# Patient Record
Sex: Female | Born: 1940
Health system: Southern US, Community
[De-identification: ages and names within clinical notes are randomized; demographics above are authoritative.]

## PROBLEM LIST (undated history)

## (undated) DIAGNOSIS — E7800 Pure hypercholesterolemia, unspecified: Secondary | ICD-10-CM

## (undated) DIAGNOSIS — IMO0001 Reserved for inherently not codable concepts without codable children: Secondary | ICD-10-CM

## (undated) DIAGNOSIS — G43909 Migraine, unspecified, not intractable, without status migrainosus: Secondary | ICD-10-CM

## (undated) DIAGNOSIS — R251 Tremor, unspecified: Secondary | ICD-10-CM

## (undated) DIAGNOSIS — K449 Diaphragmatic hernia without obstruction or gangrene: Secondary | ICD-10-CM

## (undated) DIAGNOSIS — Z85828 Personal history of other malignant neoplasm of skin: Secondary | ICD-10-CM

## (undated) DIAGNOSIS — M810 Age-related osteoporosis without current pathological fracture: Secondary | ICD-10-CM

## (undated) DIAGNOSIS — I10 Essential (primary) hypertension: Secondary | ICD-10-CM

## (undated) DIAGNOSIS — K529 Noninfective gastroenteritis and colitis, unspecified: Secondary | ICD-10-CM

## (undated) DIAGNOSIS — K635 Polyp of colon: Secondary | ICD-10-CM

## (undated) DIAGNOSIS — G252 Other specified forms of tremor: Secondary | ICD-10-CM

## (undated) DIAGNOSIS — R634 Abnormal weight loss: Secondary | ICD-10-CM

## (undated) DIAGNOSIS — M5030 Other cervical disc degeneration, unspecified cervical region: Secondary | ICD-10-CM

## (undated) DIAGNOSIS — F419 Anxiety disorder, unspecified: Secondary | ICD-10-CM

## (undated) HISTORY — PX: RHINOPLASTY: SUR1284

## (undated) HISTORY — DX: Pure hypercholesterolemia, unspecified: E78.00

## (undated) HISTORY — DX: Polyp of colon: K63.5

## (undated) HISTORY — PX: SINUS SURGERY WITH INSTATRAK: SHX5215

## (undated) HISTORY — DX: Anxiety disorder, unspecified: F41.9

## (undated) HISTORY — DX: Age-related osteoporosis without current pathological fracture: M81.0

## (undated) HISTORY — DX: Diaphragmatic hernia without obstruction or gangrene: K44.9

## (undated) HISTORY — PX: HIATAL HERNIA REPAIR: SHX195

---

## 2002-06-12 ENCOUNTER — Encounter: Admission: RE | Admit: 2002-06-12 | Discharge: 2002-06-12 | Payer: Self-pay | Admitting: Family Medicine

## 2002-06-12 ENCOUNTER — Encounter: Payer: Self-pay | Admitting: Family Medicine

## 2002-06-25 ENCOUNTER — Encounter: Payer: Self-pay | Admitting: Family Medicine

## 2002-06-25 ENCOUNTER — Encounter: Admission: RE | Admit: 2002-06-25 | Discharge: 2002-06-25 | Payer: Self-pay | Admitting: Family Medicine

## 2003-10-21 ENCOUNTER — Other Ambulatory Visit: Admission: RE | Admit: 2003-10-21 | Discharge: 2003-10-21 | Payer: Self-pay | Admitting: Family Medicine

## 2004-01-31 ENCOUNTER — Encounter (INDEPENDENT_AMBULATORY_CARE_PROVIDER_SITE_OTHER): Payer: Self-pay | Admitting: Specialist

## 2004-01-31 ENCOUNTER — Ambulatory Visit (HOSPITAL_COMMUNITY): Admission: RE | Admit: 2004-01-31 | Discharge: 2004-01-31 | Payer: Self-pay | Admitting: Gastroenterology

## 2004-10-22 ENCOUNTER — Other Ambulatory Visit: Admission: RE | Admit: 2004-10-22 | Discharge: 2004-10-22 | Payer: Self-pay | Admitting: Family Medicine

## 2005-10-21 ENCOUNTER — Encounter: Admission: RE | Admit: 2005-10-21 | Discharge: 2005-10-21 | Payer: Self-pay | Admitting: Family Medicine

## 2005-11-17 ENCOUNTER — Encounter: Admission: RE | Admit: 2005-11-17 | Discharge: 2005-11-17 | Payer: Self-pay | Admitting: Otolaryngology

## 2006-10-20 ENCOUNTER — Other Ambulatory Visit: Admission: RE | Admit: 2006-10-20 | Discharge: 2006-10-20 | Payer: Self-pay | Admitting: Family Medicine

## 2007-01-30 ENCOUNTER — Encounter: Admission: RE | Admit: 2007-01-30 | Discharge: 2007-01-30 | Payer: Self-pay | Admitting: Family Medicine

## 2008-10-28 ENCOUNTER — Other Ambulatory Visit: Admission: RE | Admit: 2008-10-28 | Discharge: 2008-10-28 | Payer: Self-pay | Admitting: Family Medicine

## 2010-06-05 NOTE — Op Note (Signed)
NAME:  KHARTER, SESTAK                 ACCOUNT NO.:  [id number]   MEDICAL RECORD NO.:  [id number]          PATIENT TYPE:  AMB   LOCATION:  ENDO                         FACILITY:  [facility]   PHYSICIAN:  Graylin Shiver, M.D.   DATE OF BIRTH:  December 29, 1940   DATE OF PROCEDURE:  01/31/2004  DATE OF DISCHARGE:                                 OPERATIVE REPORT   PROCEDURE:  Colonoscopy with biopsy.   INDICATION:  Screening.   Informed consent was obtained after explanation of the risks of bleeding,  infection, and perforation.   PREMEDICATIONS:  1.  Fentanyl 87.5 mcg IV.  2.  Versed 8 mg IV.   DESCRIPTION OF PROCEDURE:  With the patient in the left lateral decubitus  position, a rectal exam was performed; no masses were felt.  The Olympus  colonoscope was inserted into the rectum and advanced around the colon to  the cecum.  Cecal landmarks were identified.  The cecum and ascending colon  were normal.  The transverse colon normal.  The descending colon normal.  In  the proximal sigmoid, there was a 3 mm polyp biopsied off with cold forceps.  The rectum looked normal.  She tolerated the procedure well without  complications.   IMPRESSION:  Sigmoid polyp, diagnosis code 211.3.   PLAN:  The pathology will be checked.      SFG/MEDQ  D:  01/31/2004  T:  01/31/2004  Job:  [id number]   cc:   L. Lupe Carney, M.D.  [address] [place]  [place] [address]  Fax: [phone]

## 2010-11-23 ENCOUNTER — Other Ambulatory Visit: Payer: Self-pay | Admitting: Family Medicine

## 2010-11-23 ENCOUNTER — Other Ambulatory Visit (HOSPITAL_COMMUNITY)
Admission: RE | Admit: 2010-11-23 | Discharge: 2010-11-23 | Disposition: A | Discharge status: Home / Self Care | Payer: Medicare Other | Source: Ambulatory Visit | Attending: Family Medicine | Admitting: Family Medicine

## 2010-11-23 DIAGNOSIS — Z124 Encounter for screening for malignant neoplasm of cervix: Secondary | ICD-10-CM | POA: Insufficient documentation

## 2012-09-09 ENCOUNTER — Emergency Department (HOSPITAL_COMMUNITY): Payer: Medicare Other

## 2012-09-09 ENCOUNTER — Encounter (HOSPITAL_COMMUNITY): Payer: Self-pay | Admitting: Emergency Medicine

## 2012-09-09 ENCOUNTER — Emergency Department (HOSPITAL_COMMUNITY)
Admission: EM | Admit: 2012-09-09 | Discharge: 2012-09-09 | Disposition: A | Discharge status: Home / Self Care | Payer: Medicare Other | Attending: Emergency Medicine | Admitting: Emergency Medicine

## 2012-09-09 DIAGNOSIS — Z8709 Personal history of other diseases of the respiratory system: Secondary | ICD-10-CM | POA: Insufficient documentation

## 2012-09-09 DIAGNOSIS — Z79899 Other long term (current) drug therapy: Secondary | ICD-10-CM | POA: Insufficient documentation

## 2012-09-09 DIAGNOSIS — G43909 Migraine, unspecified, not intractable, without status migrainosus: Secondary | ICD-10-CM | POA: Insufficient documentation

## 2012-09-09 HISTORY — DX: Migraine, unspecified, not intractable, without status migrainosus: G43.909

## 2012-09-09 MED ORDER — DEXAMETHASONE SODIUM PHOSPHATE 10 MG/ML IJ SOLN
10.0000 mg | Freq: Once | INTRAMUSCULAR | Status: AC
  Administered 2012-09-09: 10 mg via INTRAVENOUS
  Filled 2012-09-09: qty 1

## 2012-09-09 MED ORDER — DIPHENHYDRAMINE HCL 50 MG/ML IJ SOLN
25.0000 mg | Freq: Once | INTRAMUSCULAR | Status: AC
  Administered 2012-09-09: 25 mg via INTRAVENOUS
  Filled 2012-09-09: qty 1

## 2012-09-09 MED ORDER — METOCLOPRAMIDE HCL 5 MG/ML IJ SOLN
10.0000 mg | Freq: Once | INTRAMUSCULAR | Status: AC
  Administered 2012-09-09: 10 mg via INTRAVENOUS
  Filled 2012-09-09: qty 2

## 2012-09-09 MED ORDER — KETOROLAC TROMETHAMINE 30 MG/ML IJ SOLN
30.0000 mg | Freq: Once | INTRAMUSCULAR | Status: AC
  Administered 2012-09-09: 30 mg via INTRAVENOUS
  Filled 2012-09-09: qty 1

## 2012-09-09 MED ORDER — SODIUM CHLORIDE 0.9 % IV BOLUS (SEPSIS)
1000.0000 mL | Freq: Once | INTRAVENOUS | Status: AC
  Administered 2012-09-09: 1000 mL via INTRAVENOUS

## 2012-09-09 NOTE — ED Notes (Addendum)
Pt states migraine started yesterday. Pt states hx of sinus infection. Pt denies drainage. Pt states bones in face hurt. Pt denies numbness and tingling. Pt able to move all extremities. Pt speaking in clear complete sentences. Pt has edema noted to left eye. Pt states hx of migraines. Pt denies n/v/d. Pt alert and mentating appropriately.

## 2012-09-09 NOTE — ED Provider Notes (Signed)
CSN: [id number]     Arrival date & time 09/09/12  1224 History     First MD Initiated Contact with Patient 09/09/12 1236     Chief Complaint  Patient presents with  . Migraine   (Consider location/radiation/quality/duration/timing/severity/associated sxs/prior Treatment) Patient is a 72 y.o. female presenting with migraines. The history is provided by the patient.  Migraine This is a chronic problem. The current episode started 2 days ago. The problem occurs constantly. The problem has been gradually worsening. Associated symptoms include headaches. Pertinent negatives include no abdominal pain and no shortness of breath. Nothing aggravates the symptoms. Nothing relieves the symptoms. She has tried nothing for the symptoms.    Past Medical History  Diagnosis Date  . Migraines   . Sinus infection    No past surgical history on file. No family history on file. History  Substance Use Topics  . Smoking status: Not on file  . Smokeless tobacco: Not on file  . Alcohol Use: Not on file   OB History   Grav Para Term Preterm Abortions TAB SAB Ect Mult Living                 Review of Systems  Constitutional: Negative for fever.  Respiratory: Negative for cough and shortness of breath.   Gastrointestinal: Negative for vomiting and abdominal pain.  Neurological: Positive for headaches.  All other systems reviewed and are negative.    Allergies  Codeine and Macrodantin  Home Medications   Current Outpatient Rx  Name  Route  Sig  Dispense  Refill  . Butalbital-APAP-Caffeine (ESGIC PO)   Oral   Take 2 tablets by mouth daily as needed (headache).          . CALCIUM PO   Oral   Take 1 tablet by mouth daily.         . cholestyramine (QUESTRAN) 4 GM/DOSE powder   Oral   Take 2 g by mouth 3 (three) times daily with meals.         . Esomeprazole Magnesium (NEXIUM PO)   Oral   Take 1 tablet by mouth daily as needed (inflimation).         Marland Kitchen estrogen,  conjugated,-medroxyprogesterone (PREMPRO) 0.3-1.5 MG per tablet   Oral   Take 1 tablet by mouth daily.         . hydrochlorothiazide (HYDRODIURIL) 25 MG tablet   Oral   Take 25 mg by mouth daily.         Marland Kitchen ibuprofen (ADVIL,MOTRIN) 200 MG tablet   Oral   Take 200 mg by mouth every 6 (six) hours as needed for pain.         . metoprolol succinate (TOPROL-XL) 50 MG 24 hr tablet   Oral   Take 50 mg by mouth daily. Take with or immediately following a meal.         . Multiple Vitamins-Minerals (MULTIVITAMIN WITH MINERALS) tablet   Oral   Take 1 tablet by mouth daily.         Marland Kitchen OVER THE COUNTER MEDICATION   Sublingual   Place 1 tablet under the tongue daily as needed (headache).         . potassium chloride (K-DUR,KLOR-CON) 10 MEQ tablet   Oral   Take 10 mEq by mouth daily.         . verapamil (COVERA HS) 240 MG (CO) 24 hr tablet   Oral   Take 240 mg by mouth at bedtime.  BP 176/57  Temp(Src) 99.3 F (37.4 C) (Oral)  Resp 22  Ht 5\' 4"  (1.626 m)  Wt 125 lb (56.7 kg)  BMI 21.45 kg/m2  SpO2 98% Physical Exam  Nursing note and vitals reviewed. Constitutional: She is oriented to person, place, and time. She appears well-developed and well-nourished. No distress.  HENT:  Head: Normocephalic and atraumatic.  Eyes: EOM are normal. Pupils are equal, round, and reactive to light.  L periorbital area tender  Neck: Normal range of motion. Neck supple.  Cardiovascular: Normal rate and regular rhythm.  Exam reveals no friction rub.   No murmur heard. Pulmonary/Chest: Effort normal and breath sounds normal. No respiratory distress. She has no wheezes. She has no rales.  Abdominal: Soft. She exhibits no distension. There is no tenderness. There is no rebound.  Musculoskeletal: Normal range of motion. She exhibits no edema.  Neurological: She is alert and oriented to person, place, and time. No cranial nerve deficit. She exhibits normal muscle tone. Coordination  normal.  Skin: No rash noted. She is not diaphoretic.    ED Course   Procedures (including critical care time)  Labs Reviewed  SEDIMENTATION RATE  C-REACTIVE PROTEIN   Ct Head Wo Contrast  09/09/2012   *RADIOLOGY REPORT*  Clinical Data: Headache.  Hypertension.  CT HEAD WITHOUT CONTRAST  Technique:  Contiguous axial images were obtained from the base of the skull through the vertex without contrast.  Comparison: 01/30/2007  Findings: The ventricles are normal in overall configuration. There is mild ex vacuo dilation of the left lateral ventricle from an old infarct and there is mild generalized ventricular and sulcal enlargement reflecting age related volume loss.  No hydrocephalus.  There are no parenchymal masses or mass effect.  An old infarct extends from the left basal ganglia to the left centrum semiovale only.  There are other patchy areas of white matter hypoattenuation consistent with moderate chronic microvascular ischemic change. There is no evidence of a recent transcortical infarct.  There are no extra-axial masses or abnormal fluid collections. There is no intracranial hemorrhage.  The left maxillary sinus is opacified.  The wall is mildly thickened reflecting chronic left maxillary sinus disease.  There is mucosal thickening along the middle and anterior left ethmoid air cells in the right anterior ethmoid air cells and opacification of the left frontal sinus.  IMPRESSION: No acute intracranial abnormalities.  Old left infarct, moderate chronic microvascular ischemic change and age related volume loss.  Significant sinus disease as detailed.   Original Report Authenticated By: Amie Portland, M.D.   1. Migraine     MDM  46F presents with headache. History of migraines. States this feels similar to her previous migraines. Gradual onset since yesterday. No nausea and vomiting. Associated photophobia and phonophobia. Patient denies any vision changes. She does have some left periorbital  pain, she states she always gets. Last one of these migraines was 5 years ago. Here her vitals are stable. Neuro exam is normal. Normal cranial nerves normal coordination normal sensation and strength. (Oral is tender. She reports his always happens. We'll check ESR and CRP to screen for possible temporal arteritis. We'll scan her head. Also given a migraine cocktail. I do not feel this is consistent with a subarachnoid hemorrhage due to gradual onset and consistency with previous migraines.. Symptoms improving. Toradol given. Head CT with old infarcts, nothing new.  Patient's symptoms overall improving. States she can f/u with Dr. Clovis Riley. CRP is send out, unable to get at this time.  Due to L frontal sinus disease no vision changes, high unlikelihood of temporal arteritis. Instructed to f/u with PCP for f/u lab results.   I have reviewed all labs and imaging and considered them in my medical decision making.   Dagmar Hait, MD 09/09/12 [phone]

## 2012-09-09 NOTE — ED Notes (Signed)
Pt usually wears contacts.  Did have contact in left eye.  Did not have contact in right eye.

## 2012-09-09 NOTE — ED Notes (Signed)
Dr. Walden at bedside 

## 2012-09-11 ENCOUNTER — Encounter (HOSPITAL_COMMUNITY): Payer: Self-pay | Admitting: *Deleted

## 2012-09-11 ENCOUNTER — Emergency Department (HOSPITAL_COMMUNITY)
Admission: EM | Admit: 2012-09-11 | Discharge: 2012-09-11 | Disposition: A | Discharge status: Home / Self Care | Payer: Medicare Other | Attending: Emergency Medicine | Admitting: Emergency Medicine

## 2012-09-11 DIAGNOSIS — G43909 Migraine, unspecified, not intractable, without status migrainosus: Secondary | ICD-10-CM | POA: Insufficient documentation

## 2012-09-11 DIAGNOSIS — R112 Nausea with vomiting, unspecified: Secondary | ICD-10-CM | POA: Insufficient documentation

## 2012-09-11 DIAGNOSIS — Z881 Allergy status to other antibiotic agents status: Secondary | ICD-10-CM | POA: Insufficient documentation

## 2012-09-11 DIAGNOSIS — Z885 Allergy status to narcotic agent status: Secondary | ICD-10-CM | POA: Insufficient documentation

## 2012-09-11 DIAGNOSIS — Z8669 Personal history of other diseases of the nervous system and sense organs: Secondary | ICD-10-CM | POA: Insufficient documentation

## 2012-09-11 DIAGNOSIS — Z79899 Other long term (current) drug therapy: Secondary | ICD-10-CM | POA: Insufficient documentation

## 2012-09-11 DIAGNOSIS — L0390 Cellulitis, unspecified: Secondary | ICD-10-CM

## 2012-09-11 DIAGNOSIS — I10 Essential (primary) hypertension: Secondary | ICD-10-CM | POA: Insufficient documentation

## 2012-09-11 DIAGNOSIS — H05019 Cellulitis of unspecified orbit: Secondary | ICD-10-CM | POA: Insufficient documentation

## 2012-09-11 DIAGNOSIS — M5030 Other cervical disc degeneration, unspecified cervical region: Secondary | ICD-10-CM | POA: Insufficient documentation

## 2012-09-11 DIAGNOSIS — Z87891 Personal history of nicotine dependence: Secondary | ICD-10-CM | POA: Insufficient documentation

## 2012-09-11 HISTORY — DX: Other cervical disc degeneration, unspecified cervical region: M50.30

## 2012-09-11 HISTORY — DX: Essential (primary) hypertension: I10

## 2012-09-11 MED ORDER — SULFAMETHOXAZOLE-TRIMETHOPRIM 800-160 MG PO TABS: 1.0000 | ORAL_TABLET | Freq: Two times a day (BID) | ORAL | Status: DC

## 2012-09-11 MED ORDER — CEPHALEXIN 500 MG PO CAPS: 500.0000 mg | ORAL_CAPSULE | Freq: Four times a day (QID) | ORAL | Status: DC

## 2012-09-11 MED ORDER — METOCLOPRAMIDE HCL 10 MG PO TABS: 5.0000 mg | ORAL_TABLET | Freq: Four times a day (QID) | ORAL | Status: DC

## 2012-09-11 MED ORDER — KETOROLAC TROMETHAMINE 30 MG/ML IJ SOLN
30.0000 mg | Freq: Once | INTRAMUSCULAR | Status: AC
  Administered 2012-09-11: 30 mg via INTRAVENOUS
  Filled 2012-09-11: qty 1

## 2012-09-11 MED ORDER — SODIUM CHLORIDE 0.9 % IV BOLUS (SEPSIS)
1000.0000 mL | Freq: Once | INTRAVENOUS | Status: AC
  Administered 2012-09-11: 1000 mL via INTRAVENOUS

## 2012-09-11 MED ORDER — METOCLOPRAMIDE HCL 5 MG/ML IJ SOLN
10.0000 mg | Freq: Once | INTRAMUSCULAR | Status: AC
  Administered 2012-09-11: 10 mg via INTRAVENOUS
  Filled 2012-09-11: qty 2

## 2012-09-11 MED ORDER — DIPHENHYDRAMINE HCL 50 MG/ML IJ SOLN
25.0000 mg | Freq: Once | INTRAMUSCULAR | Status: AC
  Administered 2012-09-11: 25 mg via INTRAVENOUS
  Filled 2012-09-11: qty 1

## 2012-09-11 NOTE — ED Notes (Signed)
Pt able to drink fluids without difficulty 

## 2012-09-11 NOTE — ED Notes (Signed)
Pt states she has been having a headache since Saturday.  Pt states she was here at the end of last week for the same headache, and was told her stress was causing the HA.

## 2012-09-11 NOTE — ED Provider Notes (Signed)
CSN: [id number]     Arrival date & time 09/11/12  4098 History     First MD Initiated Contact with Patient 09/11/12 [phone]     Chief Complaint  Patient presents with  . Headache   (Consider location/radiation/quality/duration/timing/severity/associated sxs/prior Treatment) Patient is a 72 y.o. female presenting with headaches. The history is provided by the patient and the spouse.  Headache Associated symptoms: eye pain (left), nausea, photophobia and vomiting   Associated symptoms: no abdominal pain, no fever, no neck pain and no neck stiffness   Brenda [name] is a 72 y/o F with PMHx of HTN, sinus infections, migraines presenting to the ED with migraine that started last night. Patient reported that the migraine is localized to the left side of the head, described as a constant throbbing sensation without radiation. Patient reported that she has photophobia, phonophobia, nausea for a couple of days with emesis today - reported that she has vomited 3 times today, NB and NB. Patient reported that she normally presents with her migraines in this manner - no new symptoms. Patient reported that on Friday she noticed that her left eye was swollen and red. Reported that it tears mildly, mild warmth to the touch, itches and is mildly painful upon palpation. Denied injury, worst headache of life, fever, chills, neck pain, neck stiffness, back pain, abdominal pain, numbness, tingling, slurred speech, weakness, chest pain, shortness of breath, difficulty breathing.  PCP Dr. Melba Coon  Past Medical History  Diagnosis Date  . Migraines   . Sinus infection   . Hypertension   . DDD (degenerative disc disease), cervical    Past Surgical History  Procedure Laterality Date  . Sinus surgery with instatrak     No family history on file. History  Substance Use Topics  . Smoking status: Former Smoker    Quit date: 09/11/1988  . Smokeless tobacco: Not on file  . Alcohol Use: Yes     Comment: 2  glasses per day   OB History   Grav Para Term Preterm Abortions TAB SAB Ect Mult Living                 Review of Systems  Constitutional: Negative for fever and chills.  HENT: Negative for neck pain and neck stiffness.   Eyes: Positive for photophobia and pain (left).  Respiratory: Negative for chest tightness and shortness of breath.   Cardiovascular: Negative for chest pain.  Gastrointestinal: Positive for nausea and vomiting. Negative for abdominal pain.  Neurological: Positive for headaches. Negative for weakness.  All other systems reviewed and are negative.    Allergies  Codeine and Macrodantin  Home Medications   Current Outpatient Rx  Name  Route  Sig  Dispense  Refill  . Butalbital-APAP-Caffeine (ESGIC PO)   Oral   Take 2 tablets by mouth daily as needed (headache).          . CALCIUM PO   Oral   Take 1 tablet by mouth daily.         . cholestyramine (QUESTRAN) 4 GM/DOSE powder   Oral   Take 2 g by mouth 3 (three) times daily with meals.         . Esomeprazole Magnesium (NEXIUM PO)   Oral   Take 1 tablet by mouth daily as needed (inflimation).         Marland Kitchen estrogen, conjugated,-medroxyprogesterone (PREMPRO) 0.3-1.5 MG per tablet   Oral   Take 1 tablet by mouth daily.         Marland Kitchen  hydrochlorothiazide (HYDRODIURIL) 25 MG tablet   Oral   Take 25 mg by mouth daily.         Marland Kitchen ibuprofen (ADVIL,MOTRIN) 200 MG tablet   Oral   Take 200 mg by mouth every 6 (six) hours as needed for pain.         . metoprolol succinate (TOPROL-XL) 50 MG 24 hr tablet   Oral   Take 50 mg by mouth daily. Take with or immediately following a meal.         . Multiple Vitamins-Minerals (MULTIVITAMIN WITH MINERALS) tablet   Oral   Take 1 tablet by mouth daily.         . potassium chloride (K-DUR,KLOR-CON) 10 MEQ tablet   Oral   Take 10 mEq by mouth daily.         . rizatriptan (MAXALT-MLT) 10 MG disintegrating tablet   Oral   Take 10 mg by mouth as needed for  migraine. May repeat in 2 hours if needed         . verapamil (COVERA HS) 240 MG (CO) 24 hr tablet   Oral   Take 240 mg by mouth at bedtime.         . cephALEXin (KEFLEX) 500 MG capsule   Oral   Take 1 capsule (500 mg total) by mouth 4 (four) times daily.   40 capsule   0   . metoCLOPramide (REGLAN) 10 MG tablet   Oral   Take 0.5 tablets (5 mg total) by mouth every 6 (six) hours.   30 tablet   0   . sulfamethoxazole-trimethoprim (BACTRIM DS,SEPTRA DS) 800-160 MG per tablet   Oral   Take 1 tablet by mouth 2 (two) times daily. One po bid x 7 days   14 tablet   0    BP 138/67  Pulse 93  Temp(Src) 98 F (36.7 C) (Oral)  Resp 16  SpO2 99% Physical Exam  Nursing note and vitals reviewed. Constitutional: She is oriented to person, place, and time. She appears well-developed and well-nourished. No distress.  HENT:  Head: Normocephalic and atraumatic.  Negative asymmetry Negative facial drooping  Pain upon palpation to the frontal sinuses bilaterally  Eyes: Conjunctivae and EOM are normal. Pupils are equal, round, and reactive to light. Right eye exhibits no discharge. Left eye exhibits no discharge.  Left periorbital swelling noted - discomfort upon palpation. Erythema noted to the upper eyelid.  Sclera negative for injection. Negative drainage noted to the left eye. Mild tearing noted to the left eye. PERRLA proper.  Full ROM to the left eye with negative pain noted Negative pain upon palpation to the actual eye - discomfort upon palpation to the upper eyelid  Neck: Normal range of motion. Neck supple.  Negative neck stiffness Negative nuchal rigidity  Cardiovascular: Normal rate, regular rhythm and normal heart sounds.   Pulses:      Radial pulses are 2+ on the right side, and 2+ on the left side.       Dorsalis pedis pulses are 2+ on the right side, and 2+ on the left side.  Pulmonary/Chest: Effort normal and breath sounds normal. No respiratory distress. She has no  wheezes. She has no rales.  Abdominal: Soft.  Musculoskeletal: Normal range of motion.  Lymphadenopathy:    She has no cervical adenopathy.  Neurological: She is alert and oriented to person, place, and time. No cranial nerve deficit or sensory deficit. She exhibits normal muscle tone. Coordination normal. GCS eye  subscore is 4. GCS verbal subscore is 5. GCS motor subscore is 6.  Cranial nerves III-XII grossly intact  Sensation intact to upper and lower extremities bilaterally with differentiation to sharp and dull touch  Strength 5+/5+ with resistance to upper and lower extremities bilaterally - equal distribution  Follows commands appropriately - responds to questions appropriately.  NIHSS 0   Skin: Skin is warm and dry. No rash noted. She is not diaphoretic. No erythema.  Psychiatric: She has a normal mood and affect. Her behavior is normal. Thought content normal.    ED Course   Procedures (including critical care time)  Reviewed patient's chart - patient was recently seen in the ED regarding migraine episode and left eye swelling on 09/09/2012.  CT scan of head without contrast performed with negative acute abnormalities noted, old infarcts noted.   8:45 AM Re-checked on patient - reported that she is feeling better. Denied nausea and emesis. Stated that her headache has improved.   10:35 AM Patient re-assessed. Doing well. Pain has improved. Patient tolerated PO challenge. Discussed plan for discharge, patient agreed to plan of care.   Labs Reviewed - No data to display Ct Head Wo Contrast  09/09/2012   *RADIOLOGY REPORT*  Clinical Data: Headache.  Hypertension.  CT HEAD WITHOUT CONTRAST  Technique:  Contiguous axial images were obtained from the base of the skull through the vertex without contrast.  Comparison: 01/30/2007  Findings: The ventricles are normal in overall configuration. There is mild ex vacuo dilation of the left lateral ventricle from an old infarct and there is mild  generalized ventricular and sulcal enlargement reflecting age related volume loss.  No hydrocephalus.  There are no parenchymal masses or mass effect.  An old infarct extends from the left basal ganglia to the left centrum semiovale only.  There are other patchy areas of white matter hypoattenuation consistent with moderate chronic microvascular ischemic change. There is no evidence of a recent transcortical infarct.  There are no extra-axial masses or abnormal fluid collections. There is no intracranial hemorrhage.  The left maxillary sinus is opacified.  The wall is mildly thickened reflecting chronic left maxillary sinus disease.  There is mucosal thickening along the middle and anterior left ethmoid air cells in the right anterior ethmoid air cells and opacification of the left frontal sinus.  IMPRESSION: No acute intracranial abnormalities.  Old left infarct, moderate chronic microvascular ischemic change and age related volume loss.  Significant sinus disease as detailed.   Original Report Authenticated By: Amie Portland, M.D.   1. Migraine   2. Cellulitis     MDM  Patient presenting to the ED with migraine that started last night. Reported eye swelling to the left eye that started on Friday. Patient reported that her migraine presents like her normal migraines - patient has a long history. Denied new symptoms, head injury, eye trauma.  Alert and oriented. Cranial nerves grossly intact. Sensation intact. Strength intact and equally distributed. Swelling and erythema noted to the left periorbital region - full ROM to the eye with negative discomfort noted, negative injection noted, PERRLA present. Responds to questions appropriately and pleasant upon exam.  Patient recently seen in the ED on 09/09/2012 for same complaints - migraine and left eye swelling. CT scan of head without contrast performed with negative acute intracranial abnormalities noted - old infarcts noted to exam.  Doubt SAH. Doubt  stroke. Suspicion to be migraine-like in nature. Eye swelling suspicion to be possible periorbital cellulitis. Patient stable, afebrile.  Migraine improved while in ED setting. Patient tolerated PO challenge. Discharged patient with reglan and antibiotics. Referred patient to PCP within 24 hours and to follow-up with ophthalmology regarding eye complaint. Discussed with patient to avoid wearing contacts until seen by ophthalmology. Discussed with patient to rest and stay hydrated. Discussed with patient to use Excedrin migraine relief and benadryl for aid in migraine discomfort. Discussed with patient to closely monitor symptoms and if symptoms are to worsen or change to report back to the ED - strict return instructions given.  Patient agreed to plan of care, understood, all questions answered.   Raymon Mutton, PA-C 09/11/12 [phone]

## 2012-09-14 NOTE — ED Provider Notes (Signed)
Medical screening examination/treatment/procedure(s) were performed by non-physician practitioner and as supervising physician I was immediately available for consultation/collaboration.   Laray Anger, DO 09/14/12 1241

## 2014-04-17 ENCOUNTER — Other Ambulatory Visit: Payer: Self-pay | Admitting: Family Medicine

## 2014-10-17 ENCOUNTER — Emergency Department (HOSPITAL_COMMUNITY): Payer: Medicare Other

## 2014-10-17 ENCOUNTER — Encounter (HOSPITAL_COMMUNITY): Payer: Self-pay | Admitting: Emergency Medicine

## 2014-10-17 ENCOUNTER — Emergency Department (HOSPITAL_COMMUNITY)
Admission: EM | Admit: 2014-10-17 | Discharge: 2014-10-17 | Disposition: A | Discharge status: Home / Self Care | Payer: Medicare Other | Attending: Emergency Medicine | Admitting: Emergency Medicine

## 2014-10-17 DIAGNOSIS — X58XXXA Exposure to other specified factors, initial encounter: Secondary | ICD-10-CM | POA: Insufficient documentation

## 2014-10-17 DIAGNOSIS — T18108A Unspecified foreign body in esophagus causing other injury, initial encounter: Secondary | ICD-10-CM | POA: Diagnosis present

## 2014-10-17 DIAGNOSIS — I10 Essential (primary) hypertension: Secondary | ICD-10-CM | POA: Diagnosis not present

## 2014-10-17 DIAGNOSIS — Z8739 Personal history of other diseases of the musculoskeletal system and connective tissue: Secondary | ICD-10-CM | POA: Diagnosis not present

## 2014-10-17 DIAGNOSIS — Y998 Other external cause status: Secondary | ICD-10-CM | POA: Insufficient documentation

## 2014-10-17 DIAGNOSIS — Z79899 Other long term (current) drug therapy: Secondary | ICD-10-CM | POA: Insufficient documentation

## 2014-10-17 DIAGNOSIS — Y9289 Other specified places as the place of occurrence of the external cause: Secondary | ICD-10-CM | POA: Insufficient documentation

## 2014-10-17 DIAGNOSIS — Z8709 Personal history of other diseases of the respiratory system: Secondary | ICD-10-CM | POA: Diagnosis not present

## 2014-10-17 DIAGNOSIS — G43909 Migraine, unspecified, not intractable, without status migrainosus: Secondary | ICD-10-CM | POA: Insufficient documentation

## 2014-10-17 DIAGNOSIS — Y9389 Activity, other specified: Secondary | ICD-10-CM | POA: Diagnosis not present

## 2014-10-17 DIAGNOSIS — Z87891 Personal history of nicotine dependence: Secondary | ICD-10-CM | POA: Insufficient documentation

## 2014-10-17 HISTORY — DX: Tremor, unspecified: R25.1

## 2014-10-17 LAB — BASIC METABOLIC PANEL
ANION GAP: 13 (ref 5–15)
BUN: 11 mg/dL (ref 6–20)
CHLORIDE: 105 mmol/L (ref 101–111)
CO2: 21 mmol/L — ABNORMAL LOW (ref 22–32)
Calcium: 9.3 mg/dL (ref 8.9–10.3)
Creatinine, Ser: 0.81 mg/dL (ref 0.44–1.00)
GFR calc non Af Amer: >60 mL/min (ref >60)
Glucose, Bld: 101 mg/dL — ABNORMAL HIGH (ref 65–99)
POTASSIUM: 4.1 mmol/L (ref 3.5–5.1)
SODIUM: 139 mmol/L (ref 135–145)

## 2014-10-17 LAB — CBC WITH DIFFERENTIAL/PLATELET
BASOS PCT: 0 %
Basophils Absolute: 0 10*3/uL (ref 0.0–0.1)
EOS ABS: 0.1 10*3/uL (ref 0.0–0.7)
Eosinophils Relative: 1 %
HEMATOCRIT: 43.8 % (ref 36.0–46.0)
HEMOGLOBIN: 14.8 g/dL (ref 12.0–15.0)
Lymphocytes Relative: 16 %
Lymphs Abs: 1.3 10*3/uL (ref 0.7–4.0)
MCH: 32.5 pg (ref 26.0–34.0)
MCHC: 33.8 g/dL (ref 30.0–36.0)
MCV: 96.3 fL (ref 78.0–100.0)
Monocytes Absolute: 0.5 10*3/uL (ref 0.1–1.0)
Monocytes Relative: 6 %
NEUTROS ABS: 6.1 10*3/uL (ref 1.7–7.7)
NEUTROS PCT: 77 %
Platelets: 187 10*3/uL (ref 150–400)
RBC: 4.55 MIL/uL (ref 3.87–5.11)
RDW: 12.4 % (ref 11.5–15.5)
WBC: 8 10*3/uL (ref 4.0–10.5)

## 2014-10-17 MED ORDER — METOCLOPRAMIDE HCL 5 MG/ML IJ SOLN
10.0000 mg | Freq: Once | INTRAMUSCULAR | Status: AC
  Administered 2014-10-17: 10 mg via INTRAVENOUS
  Filled 2014-10-17: qty 2

## 2014-10-17 MED ORDER — GI COCKTAIL ~~LOC~~
30.0000 mL | Freq: Once | ORAL | Status: AC
  Administered 2014-10-17: 30 mL via ORAL
  Filled 2014-10-17: qty 30

## 2014-10-17 MED ORDER — DIPHENHYDRAMINE HCL 50 MG/ML IJ SOLN
12.5000 mg | Freq: Once | INTRAMUSCULAR | Status: AC
  Administered 2014-10-17: 12.5 mg via INTRAVENOUS
  Filled 2014-10-17: qty 1

## 2014-10-17 MED ORDER — GLUCAGON HCL RDNA (DIAGNOSTIC) 1 MG IJ SOLR
0.5000 mg | Freq: Once | INTRAMUSCULAR | Status: AC
  Administered 2014-10-17: 0.5 mg via INTRAVENOUS
  Filled 2014-10-17: qty 0.5

## 2014-10-17 NOTE — ED Notes (Signed)
Patient transported to X-ray, nad

## 2014-10-17 NOTE — ED Provider Notes (Signed)
CSN: [id number]     Arrival date & time 10/17/14  1658 History   First MD Initiated Contact with Patient 10/17/14 1725     Chief Complaint  Patient presents with  . Foreign Body     (Consider location/radiation/quality/duration/timing/severity/associated sxs/prior Treatment) The history is provided by the patient.  Brenda [name] is a 74 y.o. female history hypertension here presenting with possible pill stuck in esophagus. Patient states that she took her morning medicine around 7 AM. She tries to take all her small pills first and then took her larger pills that includes calcium, potassium, vitamins. She felt that those larger pills got stuck and she was unable to tolerate any secretions since then. She was unable to keep any food or fluids down. She states that sometimes this happen in the past but she usually walks and never needed surgery or endoscopy for this. Seen [facility] in the past for colonoscopy.   Past Medical History  Diagnosis Date  . Migraines   . Sinus infection   . Hypertension   . DDD (degenerative disc disease), cervical   . Tremor    Past Surgical History  Procedure Laterality Date  . Sinus surgery with instatrak     History reviewed. No pertinent family history. Social History  Substance Use Topics  . Smoking status: Former Smoker    Quit date: 09/11/1988  . Smokeless tobacco: None  . Alcohol Use: Yes     Comment: 2 glasses per day   OB History    No data available     Review of Systems  Gastrointestinal: Positive for vomiting.  All other systems reviewed and are negative.     Allergies  Codeine and Macrodantin  Home Medications   Prior to Admission medications   Medication Sig Start Date End Date Taking? Authorizing Provider  atorvastatin (LIPITOR) 20 MG tablet Take 20 mg by mouth daily.   Yes Historical Provider, MD  Butalbital-APAP-Caffeine (ESGIC PO) Take 2 tablets by mouth daily as needed (headache).    Yes Historical Provider, MD   CALCIUM PO Take 1 tablet by mouth daily.   Yes Historical Provider, MD  cholestyramine Lanetta Inch) 4 GM/DOSE powder Take 2 g by mouth 3 (three) times daily with meals.   Yes Historical Provider, MD  esomeprazole (NEXIUM) 40 MG capsule Take 40 mg by mouth daily at 12 noon.   Yes Historical Provider, MD  estrogen, conjugated,-medroxyprogesterone (PREMPRO) 0.3-1.5 MG per tablet Take 1 tablet by mouth daily.   Yes Historical Provider, MD  hydrochlorothiazide (HYDRODIURIL) 25 MG tablet Take 25 mg by mouth daily.   Yes Historical Provider, MD  ibuprofen (ADVIL,MOTRIN) 200 MG tablet Take 200 mg by mouth every 6 (six) hours as needed for pain.   Yes Historical Provider, MD  metoprolol (LOPRESSOR) 50 MG tablet Take 50 mg by mouth 2 (two) times daily.   Yes Historical Provider, MD  Multiple Vitamins-Minerals (MULTIVITAMIN WITH MINERALS) tablet Take 1 tablet by mouth daily.   Yes Historical Provider, MD  potassium chloride (K-DUR,KLOR-CON) 10 MEQ tablet Take 10 mEq by mouth daily.   Yes Historical Provider, MD  verapamil (COVERA HS) 240 MG (CO) 24 hr tablet Take 240 mg by mouth at bedtime.   Yes Historical Provider, MD  cephALEXin (KEFLEX) 500 MG capsule Take 1 capsule (500 mg total) by mouth 4 (four) times daily. Patient not taking: Reported on 10/17/2014 09/11/12   Marissa Sciacca, PA-C  metoCLOPramide (REGLAN) 10 MG tablet Take 0.5 tablets (5 mg total) by  mouth every 6 (six) hours. Patient not taking: Reported on 10/17/2014 09/11/12   Marissa Sciacca, PA-C  sulfamethoxazole-trimethoprim (BACTRIM DS,SEPTRA DS) 800-160 MG per tablet Take 1 tablet by mouth 2 (two) times daily. One po bid x 7 days Patient not taking: Reported on 10/17/2014 09/11/12   Marissa Sciacca, PA-C   BP 151/49 mmHg  Pulse 92  Temp(Src) 97.5 F (36.4 C) (Oral)  Resp 18  SpO2 97% Physical Exam  Constitutional: She is oriented to person, place, and time.  Uncomfortable, spitting up   HENT:  Head: Normocephalic.  MM dry   Eyes:  Conjunctivae are normal. Pupils are equal, round, and reactive to light.  Neck: Normal range of motion. Neck supple.  Cardiovascular: Normal rate, regular rhythm and normal heart sounds.   Pulmonary/Chest: Effort normal and breath sounds normal. No respiratory distress. She has no wheezes. She has no rales.  Abdominal: Soft. Bowel sounds are normal. She exhibits no distension. There is no tenderness. There is no rebound.  Musculoskeletal: Normal range of motion. She exhibits no edema or tenderness.  Neurological: She is alert and oriented to person, place, and time. No cranial nerve deficit. Coordination normal.  Skin: Skin is warm and dry.  Psychiatric: She has a normal mood and affect. Her behavior is normal. Judgment and thought content normal.  Nursing note and vitals reviewed.   ED Course  Procedures (including critical care time) Labs Review Labs Reviewed  CBC WITH DIFFERENTIAL/PLATELET  BASIC METABOLIC PANEL    Imaging Review Dg Chest 2 View  10/17/2014   CLINICAL DATA:  Globus sensation in the chest after swallowing medications.  EXAM: CHEST  2 VIEW  COMPARISON:  None.  FINDINGS: Normal heart size. Mixed retrocardiac lucency and density is suggestive of a small to moderate hiatal hernia. Atherosclerotic aortic arch. Otherwise normal mediastinal contour. No radiopaque foreign body in the chest. No pneumothorax. No pleural effusion. Clear lungs, with no pulmonary edema. Mild degenerative changes in the thoracic spine.  IMPRESSION: 1. No radiopaque foreign body in the chest. 2. Mixed retrocardiac lucency and density is suggestive of a small to moderate hiatal hernia. 3. No active cardiopulmonary disease.   Electronically Signed   By: Delbert Phenix M.D.   On: 10/17/2014 17:54   I have personally reviewed and evaluated these images and lab results as part of my medical decision-making.   EKG Interpretation None      MDM   Final diagnoses:  None    Brenda [name] is a 74 y.o.  female here with possible foreign body in esophagus. Patient spitting up saliva. Will get xray, give glucagon, reglan.  7:21 PM Felt better now. Tolerated some soda after GI cocktail. CXR showed hiatal hernia. Told her to take pills individually and not altogether and see GI doctor.    Richardean Canal, MD 10/17/14 Norberta Keens

## 2014-10-17 NOTE — Discharge Instructions (Signed)
Take your nexium early.   Take your pills individually and not altogether.  See [facility].   Return to ER if you have trouble swallowing, vomiting, trouble breathing.

## 2014-10-17 NOTE — ED Notes (Signed)
Pt with possible foreign body from pill in esophagus since this am; pt unable to swallow secretions; pt sts hx of similar but this more severe; pt noted htn and sts unable to take meds today

## 2014-10-17 NOTE — ED Notes (Signed)
Pharmacy notified for glucagon. States will send.

## 2014-10-21 ENCOUNTER — Other Ambulatory Visit: Payer: Self-pay | Admitting: Gastroenterology

## 2014-10-21 DIAGNOSIS — R1310 Dysphagia, unspecified: Secondary | ICD-10-CM

## 2014-10-28 ENCOUNTER — Ambulatory Visit
Admission: RE | Admit: 2014-10-28 | Discharge: 2014-10-28 | Disposition: A | Discharge status: Home / Self Care | Payer: Medicare Other | Source: Ambulatory Visit | Attending: Gastroenterology | Admitting: Gastroenterology

## 2014-10-28 DIAGNOSIS — R1310 Dysphagia, unspecified: Secondary | ICD-10-CM

## 2014-11-14 ENCOUNTER — Other Ambulatory Visit: Payer: Self-pay | Admitting: Family Medicine

## 2014-11-14 DIAGNOSIS — K229 Disease of esophagus, unspecified: Secondary | ICD-10-CM

## 2014-11-22 ENCOUNTER — Ambulatory Visit
Admission: RE | Admit: 2014-11-22 | Discharge: 2014-11-22 | Disposition: A | Discharge status: Home / Self Care | Payer: Medicare Other | Source: Ambulatory Visit | Attending: Family Medicine | Admitting: Family Medicine

## 2014-11-22 DIAGNOSIS — K229 Disease of esophagus, unspecified: Secondary | ICD-10-CM

## 2014-12-09 ENCOUNTER — Other Ambulatory Visit: Payer: Self-pay | Admitting: General Surgery

## 2014-12-09 DIAGNOSIS — K449 Diaphragmatic hernia without obstruction or gangrene: Secondary | ICD-10-CM

## 2014-12-20 ENCOUNTER — Ambulatory Visit
Admission: RE | Admit: 2014-12-20 | Discharge: 2014-12-20 | Disposition: A | Discharge status: Home / Self Care | Payer: Medicare Other | Source: Ambulatory Visit | Attending: General Surgery | Admitting: General Surgery

## 2014-12-20 DIAGNOSIS — K449 Diaphragmatic hernia without obstruction or gangrene: Secondary | ICD-10-CM

## 2014-12-20 MED ORDER — IOPAMIDOL (ISOVUE-300) INJECTION 61%
100.0000 mL | Freq: Once | INTRAVENOUS | Status: AC | PRN
  Administered 2014-12-20: 100 mL via INTRAVENOUS

## 2014-12-26 ENCOUNTER — Ambulatory Visit (INDEPENDENT_AMBULATORY_CARE_PROVIDER_SITE_OTHER): Payer: Medicare Other | Admitting: Neurology

## 2014-12-26 ENCOUNTER — Encounter: Payer: Self-pay | Admitting: Neurology

## 2014-12-26 VITALS — BP 142/94 | HR 76 | Resp 16 | Ht 63.0 in | Wt 129.0 lb

## 2014-12-26 DIAGNOSIS — G250 Essential tremor: Secondary | ICD-10-CM

## 2014-12-26 MED ORDER — TOPIRAMATE 50 MG PO TABS: ORAL_TABLET | ORAL | Status: DC

## 2014-12-26 NOTE — Progress Notes (Signed)
Subjective:    Patient ID: Brenda SheehanBonnie W Stefano is a 74 y.o. female.  HPI     Huston FoleySaima Donella Pascarella, MD, PhD [facility] [address], [address] [address] [place], [place] [address]  Dear Dr. Clovis RileyMitchell,  I saw your patient, Brenda SalvoBonnie Fricke, upon your kind request in my neurologic clinic today for initial consultation of her tremors. The patient is accompanied by her husband today. As you know, Brenda [name] is a 74 year old left-handed woman with an underlying medical history of migraines, hypertension, degenerative disc disease of the cervical spine, sinus disease, status post sinus surgery, osteoporosis, on probably a injections, allergies, former smoking, and hiatal hernia, who reports a long-standing history of tremors of her upper extremities. Symptoms started in her teens, but were very mild and intermittent for years. Gradually she became worse. In the past 2 years she hasnoted significant worsening of her tremors. Both hands are affected and I'll also her head and sometimes her voice. Her 73 year old brother has severe tremors. She had 2 sisters, neither one had tremors but she has a strong family history on her father's side with several paternal uncles who had tremors.she is married and lives with her husband. She does not have any children.she has been on primidone for years. Currently 100 mg twice daily. She had limited results from it. She has been on metoprolol for years, currently 50 mg twice daily. She has never had surgical consultation for DBS evaluation for essential tremor. She has never been on topiramate.she has found that alcohol helps and she usually drinks 4 ounces of wine 2-3 times a day. She quit smoking in 1990, and does not drink any caffeine. I reviewed your office note from 12/10/2014, which you kindly included. Blood work from 12/10/2014 include CMP which was unremarkable with the exception of mildly low sodium at 134, AST mildly elevated at 46, cholesterol increased at  244, triglycerides 152, LDL 137.  Her Past Medical History Is Significant For: Past Medical History  Diagnosis Date  . Migraines   . Sinus infection   . Hypertension   . DDD (degenerative disc disease), cervical   . Tremor   . Hypercholesteremia   . Osteoporosis   . Colon polyp   . Hiatal hernia   . Anxiety     Her Past Surgical History Is Significant For: Past Surgical History  Procedure Laterality Date  . Sinus surgery with instatrak    . Rhinoplasty      Her Family History Is Significant For: Family History  Problem Relation Age of Onset  . Lung cancer Mother   . COPD Father   . Hypertension Brother     Her Social History Is Significant For: Social History   Social History  . Marital Status: Married    Spouse Name: N/A  . Number of Children: 0  . Years of Education: MA   Occupational History  . Retired    Social History Main Topics  . Smoking status: Former Smoker    Quit date: 09/11/1988  . Smokeless tobacco: None  . Alcohol Use: 0.0 oz/week    0 Standard drinks or equivalent per week     Comment: 2 glasses per day  . Drug Use: No  . Sexual Activity: Not Asked   Other Topics Concern  . None   Social History Narrative   Denies any caffeine use     Her Allergies Are:  Allergies  Allergen Reactions  . Codeine Nausea And Vomiting  . Macrodantin [Nitrofurantoin  Macrocrystal] Nausea And Vomiting  :   Her Current Medications Are:  Outpatient Encounter Prescriptions as of 12/26/2014  Medication Sig  . ALPRAZolam (XANAX) 0.25 MG tablet   . atorvastatin (LIPITOR) 20 MG tablet Take 20 mg by mouth daily.  . Butalbital-APAP-Caffeine (ESGIC PO) Take 2 tablets by mouth daily as needed (headache).   . CALCIUM PO Take 1 tablet by mouth daily.  Marland Kitchen esomeprazole (NEXIUM) 40 MG capsule Take 40 mg by mouth daily at 12 noon.  . estrogen, conjugated,-medroxyprogesterone (PREMPRO) 0.3-1.5 MG per tablet Take 1 tablet by mouth daily.  . hydrochlorothiazide  (HYDRODIURIL) 25 MG tablet Take 25 mg by mouth daily.  Marland Kitchen ibuprofen (ADVIL,MOTRIN) 200 MG tablet Take 200 mg by mouth every 6 (six) hours as needed for pain.  . metoprolol (LOPRESSOR) 50 MG tablet Take 50 mg by mouth 2 (two) times daily.  . Multiple Vitamins-Minerals (MULTIVITAMIN WITH MINERALS) tablet Take 1 tablet by mouth daily.  . potassium chloride (K-DUR,KLOR-CON) 10 MEQ tablet Take 10 mEq by mouth daily.  . verapamil (COVERA HS) 240 MG (CO) 24 hr tablet Take 240 mg by mouth at bedtime.  . [DISCONTINUED] cephALEXin (KEFLEX) 500 MG capsule Take 1 capsule (500 mg total) by mouth 4 (four) times daily. (Patient not taking: Reported on 10/17/2014)  . [DISCONTINUED] cholestyramine (QUESTRAN) 4 GM/DOSE powder Take 2 g by mouth 3 (three) times daily with meals.  . [DISCONTINUED] metoCLOPramide (REGLAN) 10 MG tablet Take 0.5 tablets (5 mg total) by mouth every 6 (six) hours. (Patient not taking: Reported on 10/17/2014)  . [DISCONTINUED] sulfamethoxazole-trimethoprim (BACTRIM DS,SEPTRA DS) 800-160 MG per tablet Take 1 tablet by mouth 2 (two) times daily. One po bid x 7 days (Patient not taking: Reported on 10/17/2014)   No facility-administered encounter medications on file as of 12/26/2014.  : Review of Systems:  Out of a complete 14 point review of systems, all are reviewed and negative with the exception of these symptoms as listed below:   Review of Systems  Constitutional:       Weight gain   Cardiovascular: Positive for chest pain.  Gastrointestinal: Positive for diarrhea.  Neurological: Positive for tremors and syncope.  Psychiatric/Behavioral:       Anxiety, decreased energy, change in appetite, decreased activity.     Objective:  Neurologic Exam  Physical Exam Physical Examination:   Filed Vitals:   12/26/14 1256  BP: 142/94  Pulse: 76  Resp: 16   General Examination: The patient is a very pleasant 74 y.o. female in no acute distress. She appears well-developed and  well-nourished and well groomed. She is anxious appearing.   HEENT: Normocephalic, atraumatic, pupils are equal, round and reactive to light and accommodation. Funduscopic exam is normal with sharp disc margins noted. Extraocular tracking is good without limitation to gaze excursion or nystagmus noted. Normal smooth pursuit is noted. Hearing is grossly intact. Tympanic membranes are clear bilaterally. Face is symmetric with normal facial animation and normal facial sensation. Speech is clear with no dysarthria noted. There is no hypophonia. There is no lip, or jaw tremor. She has no significant voice tremor, , except slight and intermittent, she has a mild head tremor. Neck is supple with full range of passive and active motion. There are no carotid bruits on auscultation. Oropharynx exam reveals: moderate mouth dryness, adequate dental hygiene and mild airway crowding. Mallampati is class II. Tongue protrudes centrally and palate elevates symmetrically. Tonsils are small.    Chest: Clear to auscultation without wheezing, rhonchi or  crackles noted.  Heart: S1+S2+0, regular and normal without murmurs, rubs or gallops noted.   Abdomen: Soft, non-tender and non-distended with normal bowel sounds appreciated on auscultation.  Extremities: There is no pitting edema in the distal lower extremities bilaterally. Pedal pulses are intact.  Skin: Warm and dry without trophic changes noted. There are no varicose veins.  Musculoskeletal: exam reveals no obvious joint deformities, tenderness or joint swelling or erythema.   Neurologically:  Mental status: The patient is awake, alert and oriented in all 4 spheres. Her immediate and remote memory, attention, language skills and fund of knowledge are appropriate. There is no evidence of aphasia, agnosia, apraxia or anomia. Speech is clear with normal prosody and enunciation. Thought process is linear. Mood is normal and affect is constricted.  Cranial nerves II -  XII are as described above under HEENT exam. In addition: shoulder shrug is normal with equal shoulder height noted. Motor exam: Normal bulk, strength and tone is noted. There is no drift, or rebound. There is no resting tremor. There is a bilateral upper extremity postural and action tremor, which is moderate in degree, left more pronounced. There tremor frequency is fairly fast and the amplitude is small to medium. On Archimedes spiral drawing there is moderate tremulousness noted b/l. Handwriting is moderately tremulous, still legible. There is no evidence of micrographia.  Romberg is negative. Reflexes are 2+ throughout. Babinski: Toes are flexor bilaterally. Fine motor skills and coordination: intact with normal finger taps, normal hand movements, normal rapid alternating patting, normal foot taps and normal foot agility.  Cerebellar testing: No dysmetria or intention tremor on finger to nose testing. Heel to shin is unremarkable bilaterally. There is no truncal or gait ataxia.  Sensory exam: intact to light touch, pinprick, vibration, temperature sense in the upper and lower extremities.  Gait, station and balance: She stands easily. No veering to one side is noted. No leaning to one side is noted. Posture is age-appropriate and stance is narrow based. Gait shows normal stride length and normal pace. No problems turning are noted. She turns en bloc. Tandem walk is slightly challenging for her.               Assessment and Plan:   In summary, Brenda [name] is a very pleasant 74 y.o.-year old female with an underlying medical history of migraines, hypertension, degenerative disc disease of the cervical spine, sinus disease, status post sinus surgery, osteoporosis, on probably a injections, allergies, former smoking, and hiatal hernia, whose history and physical exam in keeping with ET (essential tremor) of many years duration. Her exam is otherwise nonfocal. She has been on metoprolol and primidone for  years. She and her husband are advised that there is not a whole lot of symptomatic treatment options available for essential tremor unfortunately. She may be a reasonable surgical candidate and we talked a little bit about DBS surgery for essential tremor. She is currently in the process of being evaluated for hiatal hernia surgery with plans for surgery after the first of the year. We will certainly pick up our discussion about potential DBS evaluation in the near future. In the interim, she is advised to continue with primidone and metoprolol and start a trial of Topamax starting with 50 mg pills, with gradual titration to up to 100 mg twice daily. I talked to her about potential side effects, expectations, titration and contraindications today. She was given a new prescription and written instructions. Furthermore, she is advised to limit her  alcohol intake as it does not tend to mix well with primidone or Topamax. She is advised to stay well-hydrated.  I would like to see her back in 3 months, sooner if the need arises. I answered all her questions today and the patient and her husband were in agreement.  Thank you very much for allowing me to participate in the care of this nice patient. If I can be of any further assistance to you please do not hesitate to call me at [phone].  Sincerely,   Huston Foley, MD, PhD

## 2014-12-26 NOTE — Patient Instructions (Addendum)
  Please remember, that any kind of tremor may be exacerbated by anxiety, anger, nervousness, excitement, dehydration, sleep deprivation, by caffeine, and low blood sugar values or blood sugar fluctuations. Some medications, especially some antidepressants and lithium can cause or exacerbate tremors. Tremors may temporarily calm down her subside with the use of a benzodiazepine such as Valium or related medications and with alcohol. Be aware however that drinking alcohol is not an approved treatment or appropriate treatment for tremor control and long-term use of benzodiazepines such as Valium, lorazepam, alprazolam, or clonazepam can cause habit formation, physical and psychological addiction.  Please limit your alcohol intake, as it does not mix well with your medications.   For additional tremor control we will try Topamax 50 mg: 1 pill each night x 1 week, then 1 pill 2 times daily x 1 week, then 1 pill in AM and 2 at night x 1 week, then 2 pills 2 times daily. Common side effects reported are: Sedation, sleepiness, tingling, change in taste especially with carbonated drinks, and rare side effects are glaucoma, kidney stones, and problems with thinking including word finding difficulties. Contraindications include glaucoma and kidney stones and previous allergic reaction or intolerance to topiramate or Topamax.   We will not change anything else yet.   We will pick up our discussion about DBS (deep brain surgery) for tremors in the near future.

## 2015-01-06 ENCOUNTER — Telehealth: Payer: Self-pay | Admitting: Neurology

## 2015-01-06 NOTE — Telephone Encounter (Signed)
Patient is calling in regard to her Rx Topiramate 50 mg and her old medication Premidone.  She is asking if she is to continue her old medication Premidone.  Please call.

## 2015-01-06 NOTE — Telephone Encounter (Signed)
I spoke to Kearney ParkBonnie and confirmed with her per Dr. Teofilo PodAthar's last note she would like Kendal HymenBonnie to continue the Premidone.

## 2015-01-15 ENCOUNTER — Ambulatory Visit (HOSPITAL_COMMUNITY)
Admission: RE | Admit: 2015-01-15 | Discharge: 2015-01-15 | Disposition: A | Discharge status: Home / Self Care | Payer: Medicare Other | Source: Ambulatory Visit | Attending: Gastroenterology | Admitting: Gastroenterology

## 2015-01-15 ENCOUNTER — Encounter (HOSPITAL_COMMUNITY)
Admission: RE | Disposition: A | Discharge status: Home / Self Care | Payer: Self-pay | Source: Ambulatory Visit | Attending: Gastroenterology

## 2015-01-15 DIAGNOSIS — K224 Dyskinesia of esophagus: Secondary | ICD-10-CM | POA: Insufficient documentation

## 2015-01-15 DIAGNOSIS — R1310 Dysphagia, unspecified: Secondary | ICD-10-CM | POA: Diagnosis present

## 2015-01-15 HISTORY — PX: ESOPHAGEAL MANOMETRY: SHX5429

## 2015-01-15 SURGERY — MANOMETRY, ESOPHAGUS

## 2015-01-15 MED ORDER — LIDOCAINE VISCOUS 2 % MT SOLN
OROMUCOSAL | Status: AC
  Filled 2015-01-15: qty 15

## 2015-01-15 SURGICAL SUPPLY — 2 items
FACESHIELD LNG OPTICON STERILE (SAFETY) IMPLANT
GLOVE BIO SURGEON STRL SZ8 (GLOVE) ×6 IMPLANT

## 2015-01-16 ENCOUNTER — Encounter (HOSPITAL_COMMUNITY): Payer: Self-pay | Admitting: Gastroenterology

## 2015-02-11 ENCOUNTER — Ambulatory Visit: Payer: Self-pay | Admitting: General Surgery

## 2015-02-11 NOTE — H&P (Signed)
History of Present Illness [name]; 02/11/2015 12:04 PM) The patient is a 75 year old female who presents with a hiatal hernia. Patient is a 75 year old female who is referred by Dr. Evette Cristal for evaluation of bilateral hernia. Patient had previously undergone EGD which revealed large hiatal hernia, no pseudo-achalasia. Patient also had a manometry which revealed no achalasia.  Patient states she continues with some dysphagia, however she is able to tolerate liquids. Patient has lost approximately 11 pounds since her last visit.   Allergies Fay Records, [place]; 02/11/2015 11:16 AM) Codeine and Related Macrodantin *URINARY ANTI-INFECTIVES*  Medication History Fay Records, CMA; 02/11/2015 11:16 AM) Atorvastatin Calcium (  Tablet, Oral) Active. Cholestyramine (4GM/DOSE Powder, Oral) Active. Butalbital-APAP-Caffeine (50-325-40MG  Tablet, Oral) Active. Citalopram Hydrobromide (  Tablet, Oral) Active. Fluzone High-Dose (0.5ML Susp Pref Syr, Intramuscular) Active. Primidone (  Tablet, Oral) Active. Verapamil HCl ER (  Tablet ER, Oral) Active. Prempro (0.3-1.5MG  Tablet, Oral) Active. Metoprolol Tartrate (  Tablet, Oral) Active. Klor-Con 10 ( Tablet ER, Oral) Active. NexIUM (  Capsule DR, Oral) Active. HydroCHLOROthiazide (  Tablet, Oral) Active. Medications Reconciled  Vitals Fay Records CMA; 02/11/2015 11:17 AM) 02/11/2015 11:16 AM Weight: 118 lb Height: 63.5in Body Surface Area: 1.55 m Body Mass Index: 20.57 kg/m  Temp.: 97.34F(Temporal)  Pulse: 76 (Regular)  BP: 124/82 (Sitting, Left Arm, Standard)       Physical Exam [name]; 02/11/2015 12:04 PM) General Mental Status-Alert. General Appearance-Consistent with stated age. Hydration-Well hydrated. Voice-Normal.  Head and Neck Head-normocephalic, atraumatic with no lesions or palpable masses. Trachea-midline. Thyroid Gland Characteristics -  normal size and consistency.  Chest and Lung Exam Chest and lung exam reveals -quiet, even and easy respiratory effort with no use of accessory muscles and on auscultation, normal breath sounds, no adventitious sounds and normal vocal resonance. Inspection Chest Wall - Normal. Back - normal.  Cardiovascular Cardiovascular examination reveals -normal heart sounds, regular rate and rhythm with no murmurs and normal pedal pulses bilaterally.  Abdomen Inspection Inspection of the abdomen reveals - No Hernias. Skin - Scar - no surgical scars. Palpation/Percussion Palpation and Percussion of the abdomen reveal - Soft, Non Tender, No Rebound tenderness, No Rigidity (guarding) and No hepatosplenomegaly. Auscultation Auscultation of the abdomen reveals - Bowel sounds normal.    Assessment & Plan [name]; 02/11/2015 12:05 PM) HIATAL HERNIA (K44.9) Impression: 75 year old female with a large hiatal hernia. We have ruled out achalasia. I believe that her dysphagia is coming from the angulation of right hernia.  1. The patient will like to proceed to the operating room for a robotic hiatal hernia repair and Nissen fundoplication. 2. I discussed with her the risks and benefits the procedure to include but not limited to: Infection, bleeding, damage to structures, possible pneumothorax. Patient was understanding and wishes to proceed.

## 2015-03-06 ENCOUNTER — Encounter: Payer: Self-pay | Admitting: Neurology

## 2015-03-06 ENCOUNTER — Ambulatory Visit (INDEPENDENT_AMBULATORY_CARE_PROVIDER_SITE_OTHER): Payer: Medicare Other | Admitting: Neurology

## 2015-03-06 VITALS — BP 118/70 | HR 78 | Resp 14 | Ht 63.0 in | Wt 116.0 lb

## 2015-03-06 DIAGNOSIS — G250 Essential tremor: Secondary | ICD-10-CM

## 2015-03-06 NOTE — Progress Notes (Signed)
Subjective:    Patient ID: Brenda [name] is a 76 y.o. female.  HPI     Interim history:  Brenda [name] is a 75 year old left-handed woman with an underlying medical history of migraines, hypertension, degenerative disc disease of the cervical spine, sinus disease, status post sinus surgery, osteoporosis, on probably a injections, allergies, former smoking, and hiatal hernia, who presents for follow-up consultation of her upper extremity tremors. The patient is accompanied by her husband today. I first met her on 12/26/2014 at the request of her primary care physician, at which time she reported a long-standing history of upper extremity tremors with symptoms dating back to her teenage years with gradual progression. Eventually, she developed a hand and voice tremor. She reported a family history of tremors as well. She was on metoprolol and primidone at the time. We talked about potentially pursuing a consultation for DBS. I suggested a trial of Topamax with gradual titration. I asked her to limit her alcohol consumption.  Today, 03/06/2015: She reports that she just recently stopped the Topamax. While it did help her tremors, unfortunately, she could not tolerate it, she reports changes in her taste, feeling dizzy, mental fogginess and also weight loss. Indeed, in December 2016 she was 129 pounds, currently 116 pounds. She is scheduled for robotic-assisted hiatal hernia surgery on 03/26/2015. Unfortunately, she fell last night on linoleum kitchen floor. She had fallen asleep on the couch and got up in the middle of the night and did not turn the lights on. She braced her fall with her left forearm. She did cut her lower lip. She did not injure her head. She had no loss of consciousness. She has no headache. She has a bruise on her left forearm.  Previously:  12/26/2014: She reports a long-standing history of tremors of her upper extremities. Symptoms started in her teens, but were very mild and  intermittent for years. Gradually she became worse. In the past 2 years she hasnoted significant worsening of her tremors. Both hands are affected and also her head and sometimes her voice. Her 61 year old brother has severe tremors. She had 2 sisters, neither one had tremors but she has a strong family history on her father's side with several paternal uncles who had tremors.she is married and lives with her husband. She does not have any children.she has been on primidone for years. Currently 100 mg twice daily. She had limited results from it. She has been on metoprolol for years, currently 50 mg twice daily. She has never had surgical consultation for DBS evaluation for essential tremor. She has never been on topiramate.she has found that alcohol helps and she usually drinks 4 ounces of wine 2-3 times a day. She quit smoking in 1990, and does not drink any caffeine. I reviewed your office note from 12/10/2014, which you kindly included. Blood work from 12/10/2014 include CMP which was unremarkable with the exception of mildly low sodium at 134, AST mildly elevated at 46, cholesterol increased at 244, triglycerides 152, LDL 137.   Her Past Medical History Is Significant For: Past Medical History  Diagnosis Date  . Migraines   . Sinus infection   . Hypertension   . DDD (degenerative disc disease), cervical   . Tremor   . Hypercholesteremia   . Osteoporosis   . Colon polyp   . Hiatal hernia   . Anxiety     Her Past Surgical History Is Significant For: Past Surgical History  Procedure Laterality Date  . Sinus surgery  with instatrak    . Rhinoplasty    . Esophageal manometry N/A 01/15/2015    Procedure: ESOPHAGEAL MANOMETRY (EM);  Surgeon: Wonda Horner, MD;  Location: [facility];  Service: Endoscopy;  Laterality: N/A;    Her Family History Is Significant For: Family History  Problem Relation Age of Onset  . Lung cancer Mother   . COPD Father   . Hypertension Brother     Her  Social History Is Significant For: Social History   Social History  . Marital Status: Married    Spouse Name: N/A  . Number of Children: 0  . Years of Education: MA   Occupational History  . Retired    Social History Main Topics  . Smoking status: Former Smoker    Quit date: 09/11/1988  . Smokeless tobacco: None  . Alcohol Use: 0.0 oz/week    0 Standard drinks or equivalent per week     Comment: 2 glasses per day  . Drug Use: No  . Sexual Activity: Not Asked   Other Topics Concern  . None   Social History Narrative   Denies any caffeine use     Her Allergies Are:  Allergies  Allergen Reactions  . Codeine Nausea And Vomiting  . Macrodantin [Nitrofurantoin Macrocrystal] Nausea And Vomiting  :   Her Current Medications Are:  Outpatient Encounter Prescriptions as of 03/06/2015  Medication Sig  . atorvastatin (LIPITOR) 20 MG tablet Take 20 mg by mouth daily.  . Butalbital-APAP-Caffeine (ESGIC PO) Take 2 tablets by mouth daily as needed (headache).   . CALCIUM PO Take 1 tablet by mouth daily.  Marland Kitchen esomeprazole (NEXIUM) 40 MG capsule Take 40 mg by mouth daily at 12 noon.  . hydrochlorothiazide (HYDRODIURIL) 25 MG tablet Take 25 mg by mouth daily.  Marland Kitchen ibuprofen (ADVIL,MOTRIN) 200 MG tablet Take 200 mg by mouth every 6 (six) hours as needed for pain.  . Loperamide-Simethicone (IMODIUM MULTI-SYMPTOM RELIEF PO) Take by mouth.  . metoprolol (LOPRESSOR) 50 MG tablet Take 50 mg by mouth 2 (two) times daily.  . Multiple Vitamins-Minerals (MULTIVITAMIN WITH MINERALS) tablet Take 1 tablet by mouth daily.  . potassium chloride (K-DUR,KLOR-CON) 10 MEQ tablet Take 10 mEq by mouth daily.  . primidone (MYSOLINE) 50 MG tablet Take 100 mg by mouth 2 (two) times daily.  Marland Kitchen topiramate (TOPAMAX) 50 MG tablet 1 pill each night x 1 week, then 1 pill 2 times daily x 1 week, then 1 pill in AM and 2 at night x 1 week, then 2 pills 2 times daily.  . verapamil (COVERA HS) 240 MG (CO) 24 hr tablet Take  240 mg by mouth at bedtime.  . [DISCONTINUED] ALPRAZolam (XANAX) 0.25 MG tablet   . [DISCONTINUED] estrogen, conjugated,-medroxyprogesterone (PREMPRO) 0.3-1.5 MG per tablet Take 1 tablet by mouth daily.   No facility-administered encounter medications on file as of 03/06/2015.  :  Review of Systems:  Out of a complete 14 point review of systems, all are reviewed and negative with the exception of these symptoms as listed below:   Review of Systems  Neurological:       Patient has stopped taking Topamax this week, she could not tolerate side effects. She did say that it did help her tremors.     Objective:  Neurologic Exam  Physical Exam Physical Examination:   Filed Vitals:   03/06/15 1141  BP: 118/70  Pulse: 78  Resp: 14   General Examination: The patient is a very pleasant 75 y.o.  female in no acute distress. She appears well-developed and well-nourished and well groomed. She is less anxious today, in good spirits.   HEENT: Normocephalic, atraumatic, pupils are equal, round and reactive to light and accommodation. She has a scab on the lower lip, no facial bruise. Extraocular tracking is good without limitation to gaze excursion or nystagmus noted. Normal smooth pursuit is noted. Hearing is grossly intact. Face is symmetric with normal facial animation and normal facial sensation. Speech is clear with no dysarthria noted. There is no hypophonia. There is no lip, or jaw tremor. She has a slight voice tremor, and stable mild head tremor. Neck is supple with full range of passive and active motion. There are no carotid bruits on auscultation. Oropharynx exam reveals: moderate mouth dryness, adequate dental hygiene and mild airway crowding. Mallampati is class II. Tongue protrudes centrally and palate elevates symmetrically. Tonsils are small.    Chest: Clear to auscultation without wheezing, rhonchi or crackles noted.  Heart: S1+S2+0, regular and normal without murmurs, rubs or gallops  noted.   Abdomen: Soft, non-tender and non-distended with normal bowel sounds appreciated on auscultation.  Extremities: There is no pitting edema in the distal lower extremities bilaterally. Pedal pulses are intact. Mild bruise L forearm.  Skin: Warm and dry without trophic changes noted. There are no varicose veins.  Musculoskeletal: exam reveals no obvious joint deformities, tenderness or joint swelling or erythema.   Neurologically:  Mental status: The patient is awake, alert and oriented in all 4 spheres. Her immediate and remote memory, attention, language skills and fund of knowledge are appropriate. There is no evidence of aphasia, agnosia, apraxia or anomia. Speech is clear with normal prosody and enunciation. Thought process is linear. Mood is normal and affect is normal.  Cranial nerves II - XII are as described above under HEENT exam. In addition: shoulder shrug is normal with equal shoulder height noted. Motor exam: Normal bulk, strength and tone is noted. There is no drift, or rebound. There is no resting tremor. There is a bilateral upper extremity postural and action tremor, which is moderate in degree, left more pronounced, stable. There tremor frequency is fairly fast and the amplitude is small to medium.   Romberg is negative, except slight sway. Reflexes are 2+ throughout. Fine motor skills and coordination: intact with normal finger taps, normal hand movements, normal rapid alternating patting, normal foot taps and normal foot agility.  Cerebellar testing: No dysmetria or intention tremor on finger to nose testing. Heel to shin is unremarkable bilaterally. There is no truncal or gait ataxia.  Sensory exam: intact to light touch in the upper and lower extremities.  Gait, station and balance: She stands easily. No veering to one side is noted. No leaning to one side is noted. Posture is age-appropriate and stance is narrow based. Gait shows normal stride length and normal pace. No  problems turning are noted. She turns en bloc. Tandem walk is difficult for her.  Assessment and Plan:   In summary, Brenda [name] is a very pleasant 75 year old female with an underlying medical history of migraines, hypertension, degenerative disc disease of the cervical spine, sinus disease, status post sinus surgery, osteoporosis, (Prolia injections), allergies, former smoking, and hiatal hernia (surgery planned for 03/26/15), who presents for follow-up consultation of her long-standing history of essential tremor, going back several decades. She has been on a beta blocker and primidone. She had some symptomatic relief of her tremors with a trial of Topamax recently but could  not tolerate it and ultimately stopped it this week. Side effects included changes in her taste, loss of weight, loss of appetite, cognitive changes, balance problems. We mutually agreed to keep her off of topiramate or Topamax.she has surgery planned for early next month. Once she hasrecovered from the surgery and has been able to stabilize her weight, we may be able to try her on Trokendi XR, which is a long-acting form of topiramate and I have found it to cause less side effects and be better tolerated. It is brand-name and we will likely need prior authorization for this. For now, we will see her back in about 3 months for recheck. In the interim, she will continue with her other medications including her beta blocker in the primidone. Her exam is fairly stable at this time. She has indeed lost a significant amount of weight and needs to build back up from there. We talked about DBS surgery for essential tremor during our last appointment. We will pick up our discussion in the future about this possible treatment option. She is advised to change positions slowly and stay well-hydrated. If she has to get up at night she is advised to make sure she has enough light. I spent 25 minutes in total face-to-face time with the patient, more  than 50% of which was spent in counseling and coordination of care, reviewing test results, reviewing medication and discussing or reviewing the diagnosis of ET, its prognosis and treatment options.

## 2015-03-06 NOTE — Patient Instructions (Signed)
Let's keep you off the topamax.  After your hiatal hernia surgery in March and once your weight is back up an stable, we may be able to try Trokendi XR (which is a once daily and brand name only).  Let's do a 3 month check up.

## 2015-03-13 ENCOUNTER — Encounter (HOSPITAL_COMMUNITY)
Admission: RE | Admit: 2015-03-13 | Discharge: 2015-03-13 | Disposition: A | Discharge status: Home / Self Care | Payer: Medicare Other | Source: Ambulatory Visit | Attending: General Surgery | Admitting: General Surgery

## 2015-03-13 ENCOUNTER — Encounter (HOSPITAL_COMMUNITY): Payer: Self-pay

## 2015-03-13 DIAGNOSIS — Z01818 Encounter for other preprocedural examination: Secondary | ICD-10-CM | POA: Insufficient documentation

## 2015-03-13 DIAGNOSIS — Z01812 Encounter for preprocedural laboratory examination: Secondary | ICD-10-CM | POA: Insufficient documentation

## 2015-03-13 DIAGNOSIS — K449 Diaphragmatic hernia without obstruction or gangrene: Secondary | ICD-10-CM | POA: Insufficient documentation

## 2015-03-13 DIAGNOSIS — I10 Essential (primary) hypertension: Secondary | ICD-10-CM | POA: Diagnosis not present

## 2015-03-13 DIAGNOSIS — R9431 Abnormal electrocardiogram [ECG] [EKG]: Secondary | ICD-10-CM | POA: Insufficient documentation

## 2015-03-13 HISTORY — DX: Reserved for inherently not codable concepts without codable children: IMO0001

## 2015-03-13 HISTORY — DX: Noninfective gastroenteritis and colitis, unspecified: K52.9

## 2015-03-13 HISTORY — DX: Abnormal weight loss: R63.4

## 2015-03-13 HISTORY — DX: Personal history of other malignant neoplasm of skin: Z85.828

## 2015-03-13 LAB — BASIC METABOLIC PANEL
Anion gap: 8 (ref 5–15)
BUN: 12 mg/dL (ref 6–20)
CHLORIDE: 96 mmol/L — AB (ref 101–111)
CO2: 26 mmol/L (ref 22–32)
CREATININE: 0.83 mg/dL (ref 0.44–1.00)
Calcium: 8.8 mg/dL — ABNORMAL LOW (ref 8.9–10.3)
GFR calc Af Amer: >60 mL/min (ref >60)
GFR calc non Af Amer: >60 mL/min (ref >60)
Glucose, Bld: 101 mg/dL — ABNORMAL HIGH (ref 65–99)
Potassium: 3 mmol/L — ABNORMAL LOW (ref 3.5–5.1)
SODIUM: 130 mmol/L — AB (ref 135–145)

## 2015-03-13 LAB — CBC
HCT: 42.4 % (ref 36.0–46.0)
HEMOGLOBIN: 14.5 g/dL (ref 12.0–15.0)
MCH: 32.3 pg (ref 26.0–34.0)
MCHC: 34.2 g/dL (ref 30.0–36.0)
MCV: 94.4 fL (ref 78.0–100.0)
Platelets: 155 10*3/uL (ref 150–400)
RBC: 4.49 MIL/uL (ref 3.87–5.11)
RDW: 12.3 % (ref 11.5–15.5)
WBC: 6.2 10*3/uL (ref 4.0–10.5)

## 2015-03-13 NOTE — Patient Instructions (Addendum)
YOUR PROCEDURE IS SCHEDULED ON :  03/26/15  REPORT TO [facility] MAIN ENTRANCE FOLLOW SIGNS TO EAST ELEVATOR - GO TO 3rd FLOOR CHECK IN AT 3 EAST NURSES STATION (SHORT STAY) AT: 6:30 AM  CALL THIS NUMBER IF YOU HAVE PROBLEMS THE MORNING OF SURGERY [phone]  REMEMBER:ONLY 1 PER PERSON MAY GO TO SHORT STAY WITH YOU TO GET READY THE MORNING OF YOUR SURGERY  DO NOT EAT FOOD OR DRINK LIQUIDS AFTER MIDNIGHT  TAKE THESE MEDICINES THE MORNING OF SURGERY: LIPITOR / NEXIUM / METOPROLOL / VERAPAMIL / PRIMIDONE  YOU MAY NOT HAVE ANY METAL ON YOUR BODY INCLUDING HAIR PINS AND PIERCING'S. DO NOT WEAR JEWELRY, MAKEUP, LOTIONS, POWDERS OR PERFUMES. DO NOT WEAR NAIL POLISH. DO NOT SHAVE 48 HRS PRIOR TO SURGERY. MEN MAY SHAVE FACE AND NECK.  DO NOT BRING VALUABLES TO HOSPITAL. [facility] IS NOT RESPONSIBLE FOR VALUABLES.  CONTACTS, DENTURES OR PARTIALS MAY NOT BE WORN TO SURGERY. LEAVE SUITCASE IN CAR. CAN BE BROUGHT TO ROOM AFTER SURGERY.  PATIENTS DISCHARGED THE DAY OF SURGERY WILL NOT BE ALLOWED TO DRIVE HOME.  PLEASE READ OVER THE FOLLOWING INSTRUCTION SHEETS _________________________________________________________________________________                                          [place] - PREPARING FOR SURGERY  Before surgery, you can play an important role.  Because skin is not sterile, your skin needs to be as free of germs as possible.  You can reduce the number of germs on your skin by washing with CHG (chlorahexidine gluconate) soap before surgery.  CHG is an antiseptic cleaner which kills germs and bonds with the skin to continue killing germs even after washing. Please DO NOT use if you have an allergy to CHG or antibacterial soaps.  If your skin becomes reddened/irritated stop using the CHG and inform your nurse when you arrive at Short Stay. Do not shave (including legs and underarms) for at least 48 hours prior to the first CHG shower.  You may shave your  face. Please follow these instructions carefully:   1.  Shower with CHG Soap the night before surgery and the  morning of Surgery.   2.  If you choose to wash your hair, wash your hair first as usual with your  normal  Shampoo.   3.  After you shampoo, rinse your hair and body thoroughly to remove the  shampoo.                                         4.  Use CHG as you would any other liquid soap.  You can apply chg directly  to the skin and wash . Gently wash with scrungie or clean wascloth    5.  Apply the CHG Soap to your body ONLY FROM THE NECK DOWN.   Do not use on open                           Wound or open sores. Avoid contact with eyes, ears mouth and genitals (private parts).                        Genitals (private  parts) with your normal soap.              6.  Wash thoroughly, paying special attention to the area where your surgery  will be performed.   7.  Thoroughly rinse your body with warm water from the neck down.   8.  DO NOT shower/wash with your normal soap after using and rinsing off  the CHG Soap .                9.  Pat yourself dry with a clean towel.             10.  Wear clean night clothes to bed after shower             11.  Place clean sheets on your bed the night of your first shower and do not  sleep with pets.  Day of Surgery : Do not apply any lotions/deodorants the morning of surgery.  Please wear clean clothes to the hospital/surgery center.  FAILURE TO FOLLOW THESE INSTRUCTIONS MAY RESULT IN THE CANCELLATION OF YOUR SURGERY    PATIENT SIGNATURE_________________________________  ______________________________________________________________________  WHAT IS A BLOOD TRANSFUSION? Blood Transfusion Information  A transfusion is the replacement of blood or some of its parts. Blood is made up of multiple cells which provide different functions.  Red blood cells carry oxygen and are used for blood loss replacement.  White blood cells fight  against infection.  Platelets control bleeding.  Plasma helps clot blood.  Other blood products are available for specialized needs, such as hemophilia or other clotting disorders. BEFORE THE TRANSFUSION  Who gives blood for transfusions?   Healthy volunteers who are fully evaluated to make sure their blood is safe. This is blood bank blood. Transfusion therapy is the safest it has ever been in the practice of medicine. Before blood is taken from a donor, a complete history is taken to make sure that person has no history of diseases nor engages in risky social behavior (examples are intravenous drug use or sexual activity with multiple partners). The donor's travel history is screened to minimize risk of transmitting infections, such as malaria. The donated blood is tested for signs of infectious diseases, such as HIV and hepatitis. The blood is then tested to be sure it is compatible with you in order to minimize the chance of a transfusion reaction. If you or a relative donates blood, this is often done in anticipation of surgery and is not appropriate for emergency situations. It takes many days to process the donated blood. RISKS AND COMPLICATIONS Although transfusion therapy is very safe and saves many lives, the main dangers of transfusion include:   Getting an infectious disease.  Developing a transfusion reaction. This is an allergic reaction to something in the blood you were given. Every precaution is taken to prevent this. The decision to have a blood transfusion has been considered carefully by your caregiver before blood is given. Blood is not given unless the benefits outweigh the risks. AFTER THE TRANSFUSION  Right after receiving a blood transfusion, you will usually feel much better and more energetic. This is especially true if your red blood cells have gotten low (anemic). The transfusion raises the level of the red blood cells which carry oxygen, and this usually causes an  energy increase.  The nurse administering the transfusion will monitor you carefully for complications. HOME CARE INSTRUCTIONS  No special instructions are needed after a transfusion. You may find your energy is  better. Speak with your caregiver about any limitations on activity for underlying diseases you may have. SEEK MEDICAL CARE IF:   Your condition is not improving after your transfusion.  You develop redness or irritation at the intravenous (IV) site. SEEK IMMEDIATE MEDICAL CARE IF:  Any of the following symptoms occur over the next 12 hours:  Shaking chills.  You have a temperature by mouth above 102 F (38.9 C), not controlled by medicine.  Chest, back, or muscle pain.  People around you feel you are not acting correctly or are confused.  Shortness of breath or difficulty breathing.  Dizziness and fainting.  You get a rash or develop hives.  You have a decrease in urine output.  Your urine turns a dark color or changes to pink, red, or brown. Any of the following symptoms occur over the next 10 days:  You have a temperature by mouth above 102 F (38.9 C), not controlled by medicine.  Shortness of breath.  Weakness after normal activity.  The white part of the eye turns yellow (jaundice).  You have a decrease in the amount of urine or are urinating less often.  Your urine turns a dark color or changes to pink, red, or brown. Document Released: 01/02/2000 Document Revised: 03/29/2011 Document Reviewed: 08/21/2007 [facility] Patient Information 2014 [place], [place].  _______________________________________________________________________

## 2015-03-13 NOTE — Progress Notes (Addendum)
ABNORMAL BMET FAXED TO DR.RAMIREZ SPOKE WITH ROBIN AT OFFICE ALSO

## 2015-03-26 ENCOUNTER — Encounter (HOSPITAL_COMMUNITY): Payer: Self-pay | Admitting: Anesthesiology

## 2015-03-26 ENCOUNTER — Encounter (HOSPITAL_COMMUNITY)
Admission: RE | Disposition: A | Discharge status: Home / Self Care | Payer: Self-pay | Source: Ambulatory Visit | Attending: General Surgery

## 2015-03-26 ENCOUNTER — Ambulatory Visit: Payer: Medicare Other | Admitting: Neurology

## 2015-03-26 ENCOUNTER — Inpatient Hospital Stay (HOSPITAL_COMMUNITY)
Admission: RE | Admit: 2015-03-26 | Discharge: 2015-03-28 | DRG: 328 | Disposition: A | Discharge status: Home / Self Care | Payer: Medicare Other | Source: Ambulatory Visit | Attending: General Surgery | Admitting: General Surgery

## 2015-03-26 ENCOUNTER — Inpatient Hospital Stay (HOSPITAL_COMMUNITY): Payer: Medicare Other | Admitting: Anesthesiology

## 2015-03-26 DIAGNOSIS — M810 Age-related osteoporosis without current pathological fracture: Secondary | ICD-10-CM | POA: Diagnosis present

## 2015-03-26 DIAGNOSIS — Z9889 Other specified postprocedural states: Secondary | ICD-10-CM

## 2015-03-26 DIAGNOSIS — K449 Diaphragmatic hernia without obstruction or gangrene: Principal | ICD-10-CM | POA: Diagnosis present

## 2015-03-26 DIAGNOSIS — K219 Gastro-esophageal reflux disease without esophagitis: Secondary | ICD-10-CM | POA: Diagnosis present

## 2015-03-26 DIAGNOSIS — E7800 Pure hypercholesterolemia, unspecified: Secondary | ICD-10-CM | POA: Diagnosis present

## 2015-03-26 DIAGNOSIS — I10 Essential (primary) hypertension: Secondary | ICD-10-CM | POA: Diagnosis present

## 2015-03-26 DIAGNOSIS — Z8249 Family history of ischemic heart disease and other diseases of the circulatory system: Secondary | ICD-10-CM

## 2015-03-26 DIAGNOSIS — Z01812 Encounter for preprocedural laboratory examination: Secondary | ICD-10-CM

## 2015-03-26 DIAGNOSIS — F419 Anxiety disorder, unspecified: Secondary | ICD-10-CM | POA: Diagnosis present

## 2015-03-26 DIAGNOSIS — R1310 Dysphagia, unspecified: Secondary | ICD-10-CM | POA: Diagnosis present

## 2015-03-26 DIAGNOSIS — Z87891 Personal history of nicotine dependence: Secondary | ICD-10-CM

## 2015-03-26 DIAGNOSIS — Z79899 Other long term (current) drug therapy: Secondary | ICD-10-CM | POA: Diagnosis not present

## 2015-03-26 LAB — TYPE AND SCREEN
ABO/RH(D): A POS
Antibody Screen: NEGATIVE

## 2015-03-26 LAB — POTASSIUM: Potassium: 3.9 mmol/L (ref 3.5–5.1)

## 2015-03-26 LAB — ABO/RH: ABO/RH(D): A POS

## 2015-03-26 SURGERY — FUNDOPLICATION, NISSEN, ROBOT-ASSISTED, LAPAROSCOPIC
Anesthesia: General | Site: Abdomen

## 2015-03-26 MED ORDER — PRIMIDONE 50 MG PO TABS
100.0000 mg | ORAL_TABLET | Freq: Four times a day (QID) | ORAL | Status: DC
  Administered 2015-03-27 – 2015-03-28 (×4): 100 mg via ORAL
  Filled 2015-03-26 (×10): qty 2

## 2015-03-26 MED ORDER — EPHEDRINE SULFATE 50 MG/ML IJ SOLN
INTRAMUSCULAR | Status: AC
Start: 2015-03-26 — End: 2015-03-26
  Filled 2015-03-26: qty 1

## 2015-03-26 MED ORDER — EPHEDRINE SULFATE 50 MG/ML IJ SOLN
INTRAMUSCULAR | Status: DC | PRN
  Administered 2015-03-26 (×3): 5 mg via INTRAVENOUS

## 2015-03-26 MED ORDER — SIMETHICONE 80 MG PO CHEW: 40.0000 mg | CHEWABLE_TABLET | Freq: Four times a day (QID) | ORAL | Status: DC | PRN

## 2015-03-26 MED ORDER — PROPOFOL 10 MG/ML IV BOLUS
INTRAVENOUS | Status: AC
  Filled 2015-03-26: qty 40

## 2015-03-26 MED ORDER — LACTATED RINGERS IV SOLN: INTRAVENOUS | Status: DC

## 2015-03-26 MED ORDER — ONDANSETRON HCL 4 MG/2ML IJ SOLN
INTRAMUSCULAR | Status: DC | PRN
  Administered 2015-03-26: 4 mg via INTRAVENOUS

## 2015-03-26 MED ORDER — LIDOCAINE HCL (CARDIAC) 20 MG/ML IV SOLN
INTRAVENOUS | Status: DC | PRN
  Administered 2015-03-26: 100 mg via INTRAVENOUS

## 2015-03-26 MED ORDER — HYDROMORPHONE HCL 1 MG/ML IJ SOLN
1.0000 mg | INTRAMUSCULAR | Status: DC | PRN
  Administered 2015-03-26 – 2015-03-27 (×2): 1 mg via INTRAVENOUS
  Filled 2015-03-26 (×2): qty 1

## 2015-03-26 MED ORDER — SODIUM CHLORIDE 0.9 % IJ SOLN
INTRAMUSCULAR | Status: AC
  Filled 2015-03-26: qty 10

## 2015-03-26 MED ORDER — FENTANYL CITRATE (PF) 100 MCG/2ML IJ SOLN
INTRAMUSCULAR | Status: AC
  Filled 2015-03-26: qty 2

## 2015-03-26 MED ORDER — HYDROCODONE-ACETAMINOPHEN 7.5-325 MG/15ML PO SOLN: 10.0000 mL | Freq: Four times a day (QID) | ORAL | Status: DC | PRN

## 2015-03-26 MED ORDER — EPHEDRINE SULFATE 50 MG/ML IJ SOLN
INTRAMUSCULAR | Status: AC
  Filled 2015-03-26: qty 1

## 2015-03-26 MED ORDER — PHENYLEPHRINE HCL 10 MG/ML IJ SOLN
INTRAMUSCULAR | Status: DC | PRN
  Administered 2015-03-26: 60 ug via INTRAVENOUS
  Administered 2015-03-26: 40 ug via INTRAVENOUS
  Administered 2015-03-26: 60 ug via INTRAVENOUS
  Administered 2015-03-26: 40 ug via INTRAVENOUS

## 2015-03-26 MED ORDER — LACTATED RINGERS IV SOLN
INTRAVENOUS | Status: DC | PRN
Start: 2015-03-26 — End: 2015-03-26
  Administered 2015-03-26 (×2): via INTRAVENOUS

## 2015-03-26 MED ORDER — POTASSIUM CHLORIDE CRYS ER 10 MEQ PO TBCR
10.0000 meq | EXTENDED_RELEASE_TABLET | Freq: Every day | ORAL | Status: DC
  Filled 2015-03-26 (×2): qty 1

## 2015-03-26 MED ORDER — ONDANSETRON HCL 4 MG/2ML IJ SOLN: 4.0000 mg | Freq: Once | INTRAMUSCULAR | Status: DC | PRN

## 2015-03-26 MED ORDER — HYDROMORPHONE HCL 1 MG/ML IJ SOLN
0.2500 mg | INTRAMUSCULAR | Status: DC | PRN
  Administered 2015-03-26 (×2): 0.25 mg via INTRAVENOUS
  Administered 2015-03-26: 0.5 mg via INTRAVENOUS

## 2015-03-26 MED ORDER — ROCURONIUM BROMIDE 100 MG/10ML IV SOLN
INTRAVENOUS | Status: AC
  Filled 2015-03-26: qty 1

## 2015-03-26 MED ORDER — CEFAZOLIN SODIUM-DEXTROSE 2-3 GM-% IV SOLR
INTRAVENOUS | Status: AC
  Filled 2015-03-26: qty 50

## 2015-03-26 MED ORDER — CHLORHEXIDINE GLUCONATE 4 % EX LIQD: 1.0000 "application " | Freq: Once | CUTANEOUS | Status: DC

## 2015-03-26 MED ORDER — ROCURONIUM BROMIDE 100 MG/10ML IV SOLN
INTRAVENOUS | Status: DC | PRN
  Administered 2015-03-26 (×2): 15 mg via INTRAVENOUS
  Administered 2015-03-26: 10 mg via INTRAVENOUS
  Administered 2015-03-26: 60 mg via INTRAVENOUS

## 2015-03-26 MED ORDER — FENTANYL CITRATE (PF) 100 MCG/2ML IJ SOLN
INTRAMUSCULAR | Status: DC | PRN
  Administered 2015-03-26: 25 ug via INTRAVENOUS
  Administered 2015-03-26: 100 ug via INTRAVENOUS
  Administered 2015-03-26: 25 ug via INTRAVENOUS

## 2015-03-26 MED ORDER — FENTANYL CITRATE (PF) 100 MCG/2ML IJ SOLN
25.0000 ug | INTRAMUSCULAR | Status: DC | PRN
  Administered 2015-03-26 (×2): 50 ug via INTRAVENOUS

## 2015-03-26 MED ORDER — HYDRALAZINE HCL 20 MG/ML IJ SOLN: 10.0000 mg | INTRAMUSCULAR | Status: DC | PRN

## 2015-03-26 MED ORDER — DEXAMETHASONE SODIUM PHOSPHATE 10 MG/ML IJ SOLN
INTRAMUSCULAR | Status: DC | PRN
  Administered 2015-03-26: 10 mg via INTRAVENOUS

## 2015-03-26 MED ORDER — ONDANSETRON 4 MG PO TBDP: 4.0000 mg | ORAL_TABLET | Freq: Four times a day (QID) | ORAL | Status: DC | PRN

## 2015-03-26 MED ORDER — VERAPAMIL HCL ER 240 MG PO TBCR
240.0000 mg | EXTENDED_RELEASE_TABLET | Freq: Every day | ORAL | Status: DC
  Filled 2015-03-26: qty 1

## 2015-03-26 MED ORDER — BUPIVACAINE-EPINEPHRINE (PF) 0.25% -1:200000 IJ SOLN
INTRAMUSCULAR | Status: AC
  Filled 2015-03-26: qty 30

## 2015-03-26 MED ORDER — METOCLOPRAMIDE HCL 5 MG/ML IJ SOLN
INTRAMUSCULAR | Status: DC | PRN
  Administered 2015-03-26: 10 mg via INTRAVENOUS

## 2015-03-26 MED ORDER — ONDANSETRON HCL 4 MG/2ML IJ SOLN: 4.0000 mg | Freq: Four times a day (QID) | INTRAMUSCULAR | Status: DC | PRN

## 2015-03-26 MED ORDER — CEFAZOLIN SODIUM-DEXTROSE 2-3 GM-% IV SOLR
2.0000 g | INTRAVENOUS | Status: AC
  Administered 2015-03-26: 2 g via INTRAVENOUS

## 2015-03-26 MED ORDER — METOPROLOL TARTRATE 50 MG PO TABS
50.0000 mg | ORAL_TABLET | Freq: Two times a day (BID) | ORAL | Status: DC
  Filled 2015-03-26 (×3): qty 1

## 2015-03-26 MED ORDER — 0.9 % SODIUM CHLORIDE (POUR BTL) OPTIME
TOPICAL | Status: DC | PRN
  Administered 2015-03-26: 1000 mL

## 2015-03-26 MED ORDER — LACTATED RINGERS IV SOLN
INTRAVENOUS | Status: DC | PRN
  Administered 2015-03-26: 08:00:00 via INTRAVENOUS

## 2015-03-26 MED ORDER — BUPIVACAINE-EPINEPHRINE 0.25% -1:200000 IJ SOLN
INTRAMUSCULAR | Status: DC | PRN
  Administered 2015-03-26: 10 mL

## 2015-03-26 MED ORDER — LIDOCAINE HCL (CARDIAC) 20 MG/ML IV SOLN
INTRAVENOUS | Status: AC
  Filled 2015-03-26: qty 5

## 2015-03-26 MED ORDER — LACTATED RINGERS IR SOLN
Status: DC | PRN
  Administered 2015-03-26: 1000 mL

## 2015-03-26 MED ORDER — SUGAMMADEX SODIUM 200 MG/2ML IV SOLN
INTRAVENOUS | Status: DC | PRN
  Administered 2015-03-26: 150 mg via INTRAVENOUS

## 2015-03-26 MED ORDER — HYDROCHLOROTHIAZIDE 25 MG PO TABS
25.0000 mg | ORAL_TABLET | Freq: Every day | ORAL | Status: DC
  Administered 2015-03-28: 25 mg via ORAL
  Filled 2015-03-26 (×3): qty 1

## 2015-03-26 MED ORDER — HYDROMORPHONE HCL 1 MG/ML IJ SOLN
INTRAMUSCULAR | Status: AC
  Filled 2015-03-26: qty 1

## 2015-03-26 MED ORDER — DEXTROSE-NACL 5-0.9 % IV SOLN
INTRAVENOUS | Status: DC
  Administered 2015-03-26: 16:00:00 via INTRAVENOUS
  Administered 2015-03-27: 1000 mL via INTRAVENOUS

## 2015-03-26 MED ORDER — ENOXAPARIN SODIUM 40 MG/0.4ML ~~LOC~~ SOLN
40.0000 mg | SUBCUTANEOUS | Status: DC
  Administered 2015-03-27: 40 mg via SUBCUTANEOUS
  Filled 2015-03-26 (×2): qty 0.4

## 2015-03-26 MED ORDER — LIDOCAINE HCL 1 % IJ SOLN
INTRAMUSCULAR | Status: AC
  Filled 2015-03-26: qty 80

## 2015-03-26 MED ORDER — FENTANYL CITRATE (PF) 250 MCG/5ML IJ SOLN
INTRAMUSCULAR | Status: AC
  Filled 2015-03-26: qty 5

## 2015-03-26 MED ORDER — PROPOFOL 10 MG/ML IV BOLUS
INTRAVENOUS | Status: DC | PRN
  Administered 2015-03-26: 100 mg via INTRAVENOUS

## 2015-03-26 SURGICAL SUPPLY — 56 items
APPLIER CLIP 5 13 M/L LIGAMAX5 (MISCELLANEOUS)
APPLIER CLIP ROT 10 11.4 M/L (STAPLE)
BENZOIN TINCTURE PRP APPL 2/3 (GAUZE/BANDAGES/DRESSINGS) IMPLANT
BLADE SURG SZ11 CARB STEEL (BLADE) ×3 IMPLANT
CHLORAPREP W/TINT 26ML (MISCELLANEOUS) ×3 IMPLANT
CLIP APPLIE 5 13 M/L LIGAMAX5 (MISCELLANEOUS) IMPLANT
CLIP APPLIE ROT 10 11.4 M/L (STAPLE) IMPLANT
CLIP LIGATING HEM O LOK PURPLE (MISCELLANEOUS) IMPLANT
CLIP LIGATING HEMO O LOK GREEN (MISCELLANEOUS) IMPLANT
CLOSURE WOUND 1/2 X4 (GAUZE/BANDAGES/DRESSINGS)
COVER TIP SHEARS 8 DVNC (MISCELLANEOUS) ×1 IMPLANT
COVER TIP SHEARS 8MM DA VINCI (MISCELLANEOUS) ×2
DECANTER SPIKE VIAL GLASS SM (MISCELLANEOUS) ×3 IMPLANT
DEVICE TROCAR PUNCTURE CLOSURE (ENDOMECHANICALS) ×3 IMPLANT
DRAIN PENROSE 18X1/2 LTX STRL (DRAIN) ×3 IMPLANT
DRAPE ARM DVNC X/XI (DISPOSABLE) ×4 IMPLANT
DRAPE COLUMN DVNC XI (DISPOSABLE) ×1 IMPLANT
DRAPE DA VINCI XI ARM (DISPOSABLE) ×8
DRAPE DA VINCI XI COLUMN (DISPOSABLE) ×2
ELECT REM PT RETURN 9FT ADLT (ELECTROSURGICAL) ×3
ELECTRODE REM PT RTRN 9FT ADLT (ELECTROSURGICAL) ×1 IMPLANT
ENDOLOOP SUT PDS II  0 18 (SUTURE) ×2
ENDOLOOP SUT PDS II 0 18 (SUTURE) ×1 IMPLANT
GAUZE SPONGE 2X2 8PLY STRL LF (GAUZE/BANDAGES/DRESSINGS) IMPLANT
GAUZE SPONGE 4X4 16PLY XRAY LF (GAUZE/BANDAGES/DRESSINGS) ×3 IMPLANT
GLOVE BIO SURGEON STRL SZ7.5 (GLOVE) ×9 IMPLANT
GOWN STRL REUS W/TWL XL LVL3 (GOWN DISPOSABLE) ×15 IMPLANT
KIT BASIN OR (CUSTOM PROCEDURE TRAY) ×3 IMPLANT
LIQUID BAND (GAUZE/BANDAGES/DRESSINGS) IMPLANT
MARKER SKIN DUAL TIP RULER LAB (MISCELLANEOUS) ×3 IMPLANT
MESH HERNIA 7X10 (Mesh General) ×3 IMPLANT
NEEDLE HYPO 22GX1.5 SAFETY (NEEDLE) ×3 IMPLANT
NEEDLE INSUFFLATION 14GA 120MM (NEEDLE) ×3 IMPLANT
PACK CARDIOVASCULAR III (CUSTOM PROCEDURE TRAY) ×3 IMPLANT
PAD POSITIONING PINK XL (MISCELLANEOUS) ×3 IMPLANT
SCISSORS LAP 5X35 DISP (ENDOMECHANICALS) ×3 IMPLANT
SEAL CANN UNIV 5-8 DVNC XI (MISCELLANEOUS) ×4 IMPLANT
SEAL XI 5MM-8MM UNIVERSAL (MISCELLANEOUS) ×8
SEALER VESSEL DA VINCI XI (MISCELLANEOUS) ×2
SEALER VESSEL EXT DVNC XI (MISCELLANEOUS) ×1 IMPLANT
SET BI-LUMEN FLTR TB AIRSEAL (TUBING) ×3 IMPLANT
SLEEVE XCEL OPT CAN 5 100 (ENDOMECHANICALS) IMPLANT
SOLUTION ANTI FOG 6CC (MISCELLANEOUS) ×3 IMPLANT
SOLUTION ELECTROLUBE (MISCELLANEOUS) ×3 IMPLANT
SPONGE GAUZE 2X2 STER 10/PKG (GAUZE/BANDAGES/DRESSINGS)
STAPLER VISISTAT 35W (STAPLE) IMPLANT
STRIP CLOSURE SKIN 1/2X4 (GAUZE/BANDAGES/DRESSINGS) IMPLANT
SUT MNCRL AB 4-0 PS2 18 (SUTURE) ×3 IMPLANT
SUT SILK 2 0 SH (SUTURE) ×15 IMPLANT
SYR 20CC LL (SYRINGE) ×3 IMPLANT
SYRINGE 10CC LL (SYRINGE) ×3 IMPLANT
TOWEL OR 17X26 10 PK STRL BLUE (TOWEL DISPOSABLE) ×3 IMPLANT
TOWEL OR NON WOVEN STRL DISP B (DISPOSABLE) ×3 IMPLANT
TRAY FOLEY W/METER SILVER 14FR (SET/KITS/TRAYS/PACK) ×3 IMPLANT
TRAY FOLEY W/METER SILVER 16FR (SET/KITS/TRAYS/PACK) IMPLANT
TROCAR BLADELESS OPT 5 100 (ENDOMECHANICALS) ×3 IMPLANT

## 2015-03-26 NOTE — Transfer of Care (Signed)
Immediate Anesthesia Transfer of Care Note  Patient: Brenda [name]  Procedure(s) Performed: Procedure(s): XI ROBOTIC ASSISTED LAPAROSCOPIC HIATAL HERNIA REPAIR AND NISSEN FUNDOPLICATION (N/A)  Patient Location: PACU  Anesthesia Type:General  Level of Consciousness:  sedated, patient cooperative and responds to stimulation  Airway & Oxygen Therapy:Patient Spontanous Breathing and Patient connected to face mask oxgen  Post-op Assessment:  Report given to PACU RN and Post -op Vital signs reviewed and stable  Post vital signs:  Reviewed and stable  Last Vitals:  Filed Vitals:   03/26/15 0618  BP: 130/69  Pulse: 78  Temp: 36.3 C  Resp: 16    Complications: No apparent anesthesia complications

## 2015-03-26 NOTE — Op Note (Signed)
03/26/2015  11:01 AM  PATIENT:  Brenda SheehanBonnie W [name]  75 y.o. female  PRE-OPERATIVE DIAGNOSIS:  HIATAL HERNIA   POST-OPERATIVE DIAGNOSIS:  HIATAL HERNIA   PROCEDURE:  Procedure(s): XI ROBOTIC ASSISTED LAPAROSCOPIC HIATAL HERNIA REPAIR AND NISSEN FUNDOPLICATION (N/A)  SURGEON:  Surgeon(s) and Role:    * Axel FillerArmando Paitlyn Mcclatchey, MD - Primary    * Karie SodaSteven Gross, MD - Assisting   ANESTHESIA:   local and general  EBL:  Total I/O In: 1500 [I.V.:1500] Out: 85 [Urine:75; Blood:10]  BLOOD ADMINISTERED:none  DRAINS: none   LOCAL MEDICATIONS USED:  BUPIVICAINE   SPECIMEN:  No Specimen  DISPOSITION OF SPECIMEN:  N/A  COUNTS:  YES  TOURNIQUET:  * No tourniquets in log *  DICTATION: .Dragon Dictation Indications: Patient is a [age] year old female with dysphagia secondary to a large hiatal hernia approximately 50% of her stomach within the chest. Patient was seen in clinic decided to have this electively repaired  Details of the procedure:  She was taken back to the operating room and placed in the supine position with bilateral SCDs in place. Patient underwent general endotracheal anesthesia. Foley catheter was placed. The patient was prepped and draped in the usual sterile fashion. After appropriate antibiotics were confirmed a timeout was called and all facts were verified.   A Veress needle technique was used to insufflate the abdomen to 15 mm of mercury the paramedian stab incision in the left lower quadrant. Subsequent to this an 8 mm trocar was introduced as was a 8 millimeter camera. At this time the subsequent robotic trochars x3, were then placed adjacent to this trocar approximately 8-10 cm away. Each trocar was used under direct visualization, there were total of 4 trochars. Assistant trocar was then placed in the right lower quadrant under direct visualization. The Nathanson retractor was then visualized inserted into the abdomen and the incision just to the left of the falciform ligament.  This was then placed to retract the liver appropriately. At this time the patient was positioned in reverse Trendelenburg.   At this time the robot patient cart was brought to the bedside and placed in good position and the arms were docked to the trochars appropriately. At this time I proceeded to incised the gastrohepatic ligament with cautery.  At this time I proceeded to mobilize the stomach inferiorly and visualize the right crus. The peritoneum over the right crus was incised to the right crus was identified. Proceed dissect this inferiorly until the left crus was seen joining the right crus. Once the right crus was adequately dissected we turned our to the left crus which was dissected away. This required traction of the stomach to the right side. Once this was visualized we then proceeded to circumferentially dissect the esophagus away from the surrounding tissue. At this time a [name] drain was placed around the esophagus to help with retraction. At this time the phrenoesophageal fat pad was dissected away from the esophagus. At this time the stomach was retracted caudad. The hiatal hernia and esophagus was then separately dissected cephalad. I mobilized the esophagus cephalad approximately 3-4 cm, clearing away the surrounding tissue.   At this time I proceeded to close the hiatus using 3 interrupted 2-0 silk stitches in interrupted fashion. This brought together the hiatal closure without undue stricture to the esophagus. At this time a piece of U-shaped bio a mesh was then brought into the abdomen and placed at the hiatus. This was sutured to the diaphragm/hiatal closure using 2-0 silk's in  interrupted fashion.  At this time we turned our attention to the greater curvature the stomach and the omentum was mobilized using the robotic vessel sealer. This was taken up to the greater curvature to the hiatus. This mobilized the entire greater curvature to allow mobilization and the wrap. I then  proceeded to bring the greater curvature the stomach posterior to the esophagus, and a shoeshine technique was used to evaluate the mobilization of the greater curvature.   At this time the greater curvature was brought around the esophagus and sutured using 2-0 silk sutures interrupted fashion approximately 1 cm apart x3. The middle suture was sutured to the esophagus. A left collar stitch was then used to gastropexy the stomach from the wrap to the diaphragm just lateral to the left crus as.  A second collar stitch was placed from the wrap to the right crus. The wrap lay at approximately 11:00 on its own with undue tension.  The wrap lay loose with no strangulation of the esophagus.  At this time the robot was undocked. The liver retractor was removed. At this time insufflation was evacuated. Skin was reapproximated for Monocryl subcuticular fashion. The skin was then dressed with Dermabond. The patient tolerated the procedure well and was taken to the recovery room in stable condition.  PLAN OF CARE: Admit to inpatient   PATIENT DISPOSITION:  PACU - hemodynamically stable.   Delay start of Pharmacological VTE agent (>24hrs) due to surgical blood loss or risk of bleeding: yes

## 2015-03-26 NOTE — Anesthesia Procedure Notes (Signed)
Procedure Name: Intubation Date/Time: 03/26/2015 8:35 AM Performed by: Marv Alfrey, Nuala AlphaKRISTOPHER Pre-anesthesia Checklist: Patient identified, Emergency Drugs available, Suction available, Patient being monitored and Timeout performed Patient Re-evaluated:Patient Re-evaluated prior to inductionOxygen Delivery Method: Circle system utilized Preoxygenation: Pre-oxygenation with 100% oxygen Intubation Type: IV induction Ventilation: Mask ventilation without difficulty Laryngoscope Size: Mac and 3 Grade View: Grade II Tube type: Oral Tube size: 7.5 mm Number of attempts: 1 Airway Equipment and Method: Stylet Placement Confirmation: ETT inserted through vocal cords under direct vision,  positive ETCO2,  CO2 detector and breath sounds checked- equal and bilateral Secured at: 21 cm Tube secured with: Tape Dental Injury: Teeth and Oropharynx as per pre-operative assessment

## 2015-03-26 NOTE — Anesthesia Postprocedure Evaluation (Signed)
Anesthesia Post Note  Patient: Rico SheehanBonnie W Emory  Procedure(s) Performed: Procedure(s) (LRB): XI ROBOTIC ASSISTED LAPAROSCOPIC HIATAL HERNIA REPAIR AND NISSEN FUNDOPLICATION (N/A)  Patient location during evaluation: PACU Anesthesia Type: General Level of consciousness: awake and alert Pain management: pain level controlled Vital Signs Assessment: post-procedure vital signs reviewed and stable Respiratory status: spontaneous breathing, nonlabored ventilation, respiratory function stable and patient connected to nasal cannula oxygen Cardiovascular status: blood pressure returned to baseline and stable Postop Assessment: no signs of nausea or vomiting Anesthetic complications: no    Last Vitals:  Filed Vitals:   03/26/15 1347 03/26/15 1500  BP: 136/55 115/41  Pulse: 91 91  Temp: 36.4 C 36.3 C  Resp: 14 14    Last Pain:  Filed Vitals:   03/26/15 1520  PainSc: 2                  Cecile HearingStephen Edward Turk

## 2015-03-26 NOTE — Anesthesia Preprocedure Evaluation (Addendum)
Anesthesia Evaluation  Patient identified by MRN, date of birth, ID band Patient awake    Reviewed: Allergy & Precautions, NPO status , Patient's Chart, lab work & pertinent test results, reviewed documented beta blocker date and time   Airway Mallampati: II  TM Distance: <3 FB Neck ROM: Full    Dental  (+) Teeth Intact, Dental Advisory Given, Loose,    Pulmonary former smoker,    Pulmonary exam normal breath sounds clear to auscultation       Cardiovascular hypertension, Pt. on medications and Pt. on home beta blockers Normal cardiovascular exam Rhythm:Regular Rate:Normal     Neuro/Psych  Headaches, PSYCHIATRIC DISORDERS Anxiety  Neuromuscular disease (tremor)    GI/Hepatic Neg liver ROS, hiatal hernia, GERD  Medicated,  Endo/Other  negative endocrine ROS  Renal/GU negative Renal ROS     Musculoskeletal  (+) Arthritis , Osteoarthritis,    Abdominal   Peds  Hematology negative hematology ROS (+)   Anesthesia Other Findings Day of surgery medications reviewed with the patient.  Reproductive/Obstetrics                            Anesthesia Physical Anesthesia Plan  ASA: II  Anesthesia Plan: General   Post-op Pain Management:    Induction: Intravenous  Airway Management Planned: Oral ETT  Additional Equipment:   Intra-op Plan:   Post-operative Plan: Extubation in OR  Informed Consent: I have reviewed the patients History and Physical, chart, labs and discussed the procedure including the risks, benefits and alternatives for the proposed anesthesia with the patient or authorized representative who has indicated his/her understanding and acceptance.   Dental advisory given  Plan Discussed with: CRNA  Anesthesia Plan Comments: (Risks/benefits of general anesthesia discussed with patient including risk of damage to teeth, lips, gum, and tongue, nausea/vomiting, allergic reactions to  medications, and the possibility of heart attack, stroke and death.  All patient questions answered.  Patient wishes to proceed.)        Anesthesia Quick Evaluation

## 2015-03-26 NOTE — H&P (Signed)
Brenda [name] is an 75 y.o. female.   History of Present Illness Brenda [name](Brenda Faucett MD; 02/11/2015 12:04 PM) The patient is a 75 year old female who presents with a hiatal hernia. Patient is a 75 year old female who is referred by Dr. Evette [name] for evaluation of bilateral hernia. Patient had previously undergone EGD which revealed large hiatal hernia, no pseudo-achalasia. Patient also had a manometry which revealed no achalasia.  Patient states she continues with some dysphagia, however she is able to tolerate liquids. Patient has lost approximately 11 pounds since her last visit.  Past Medical History  Diagnosis Date  . Migraines   . Hypertension   . DDD (degenerative disc disease), cervical   . Tremor   . Hypercholesteremia   . Osteoporosis   . Colon polyp   . Hiatal hernia   . Anxiety   . Shortness of breath dyspnea   . History of skin cancer   . Chronic diarrhea   . Weight loss     Past Surgical History  Procedure Laterality Date  . Sinus surgery with instatrak    . Rhinoplasty    . Esophageal manometry N/A 01/15/2015    Procedure: ESOPHAGEAL MANOMETRY (EM);  Surgeon: Brenda ShiverSalem F Ganem, MD;  Location: [facility];  Service: Endoscopy;  Laterality: N/A;    Family History  Problem Relation Age of Onset  . Lung cancer Mother   . COPD Father   . Hypertension Brother    Social History:  reports that she quit smoking about 26 years ago. She does not have any smokeless tobacco history on file. She reports that she drinks alcohol. She reports that she does not use illicit drugs.  Allergies:  Allergies  Allergen Reactions  . Macrodantin [Nitrofurantoin Macrocrystal] Nausea And Vomiting    Tongue swelling  . Codeine Nausea And Vomiting    Medications Prior to Admission  Medication Sig Dispense Refill  . atorvastatin (LIPITOR) 20 MG tablet Take 20 mg by mouth every morning.     . Butalbital-APAP-Caffeine (ESGIC PO) Take 2 tablets by mouth daily as needed (headache).     .  Calcium-Magnesium-Vitamin D [id number] MG-MG-UNIT TABS Take 1 tablet by mouth every morning.    Marland Kitchen. esomeprazole (NEXIUM) 40 MG capsule Take 40 mg by mouth every morning.     . hydrochlorothiazide (HYDRODIURIL) 25 MG tablet Take 25 mg by mouth every morning.     Marland Kitchen. ibuprofen (ADVIL,MOTRIN) 200 MG tablet Take 200 mg by mouth every 6 (six) hours as needed for pain.    . Loperamide-Simethicone (IMODIUM MULTI-SYMPTOM RELIEF PO) Take 1 tablet by mouth every 6 (six) hours as needed (for diarrhea).     . metoprolol (LOPRESSOR) 50 MG tablet Take 50 mg by mouth 2 (two) times daily.    . Multiple Vitamins-Minerals (MULTIVITAMIN WITH MINERALS) tablet Take 1 tablet by mouth every morning.     . potassium chloride (K-DUR,KLOR-CON) 10 MEQ tablet Take 10 mEq by mouth every morning.     . primidone (MYSOLINE) 50 MG tablet Take 100 mg by mouth 4 (four) times daily.   5  . verapamil (CALAN-SR) 240 MG CR tablet Take 240 mg by mouth every morning.  11  . topiramate (TOPAMAX) 50 MG tablet 1 pill each night x 1 week, then 1 pill 2 times daily x 1 week, then 1 pill in AM and 2 at night x 1 week, then 2 pills 2 times daily. (Patient not taking: Reported on 03/13/2015) 120 tablet 5    Results for  orders placed or performed during the hospital encounter of 03/26/15 (from the past 48 hour(s))  Type and screen [facility]     Status: None (Preliminary result)   Collection Time: 03/26/15  7:00 AM  Result Value Ref Range   ABO/RH(D) A POS    Antibody Screen PENDING    Sample Expiration 03/29/2015    No results found.  Review of Systems  Constitutional: Negative.   HENT: Negative.   Respiratory: Negative.   Cardiovascular: Negative.   Gastrointestinal: Negative.   Musculoskeletal: Negative.   Skin: Negative.   Neurological: Negative.     Blood pressure 130/69, pulse 78, temperature 97.3 F (36.3 C), temperature source Oral, resp. rate 16, height  (1.6 m), weight 51.71 kg (114 lb), SpO2 98  %. Physical Exam    Physical Exam Brenda Filler MD; 02/11/2015 12:04 PM) General Mental Status-Alert. General Appearance-Consistent with stated age. Hydration-Well hydrated. Voice-Normal.  Head and Neck Head-normocephalic, atraumatic with no lesions or palpable masses. Trachea-midline. Thyroid Gland Characteristics - normal size and consistency.  Chest and Lung Exam Chest and lung exam reveals -quiet, even and easy respiratory effort with no use of accessory muscles and on auscultation, normal breath sounds, no adventitious sounds and normal vocal resonance. Inspection Chest Wall - Normal. Back - normal.  Cardiovascular Cardiovascular examination reveals -normal heart sounds, regular rate and rhythm with no murmurs and normal pedal pulses bilaterally.  Abdomen Inspection Inspection of the abdomen reveals - No Hernias. Skin - Scar - no surgical scars. Palpation/Percussion Palpation and Percussion of the abdomen reveal - Soft, Non Tender, No Rebound tenderness, No Rigidity (guarding) and No hepatosplenomegaly. Auscultation Auscultation of the abdomen reveals - Bowel sounds normal.    Assessment & Plan Brenda Filler MD; 02/11/2015 12:05 PM) HIATAL HERNIA (K44.9) Impression: 75 year old female with a large hiatal hernia. We have ruled out achalasia. I believe that her dysphagia is coming from the angulation of right hernia.  1. The patient will like to proceed to the operating room for a robotic hiatal hernia repair and Nissen fundoplication. 2. I discussed with her the risks and benefits the procedure to include but not limited to: Infection, bleeding, damage to structures, possible pneumothorax. Patient was understanding and wishes to proceed.  Brenda Saver, MD 03/26/2015, 7:46 AM

## 2015-03-27 ENCOUNTER — Inpatient Hospital Stay (HOSPITAL_COMMUNITY): Payer: Medicare Other

## 2015-03-27 DIAGNOSIS — K449 Diaphragmatic hernia without obstruction or gangrene: Secondary | ICD-10-CM

## 2015-03-27 LAB — BASIC METABOLIC PANEL
ANION GAP: 11 (ref 5–15)
BUN: 12 mg/dL (ref 6–20)
CHLORIDE: 106 mmol/L (ref 101–111)
CO2: 26 mmol/L (ref 22–32)
Calcium: 7.7 mg/dL — ABNORMAL LOW (ref 8.9–10.3)
Creatinine, Ser: 0.58 mg/dL (ref 0.44–1.00)
GFR calc Af Amer: >60 mL/min (ref >60)
GLUCOSE: 204 mg/dL — AB (ref 65–99)
POTASSIUM: 4 mmol/L (ref 3.5–5.1)
Sodium: 143 mmol/L (ref 135–145)

## 2015-03-27 LAB — CBC
HEMATOCRIT: 39.7 % (ref 36.0–46.0)
HEMOGLOBIN: 12.8 g/dL (ref 12.0–15.0)
MCH: 32.2 pg (ref 26.0–34.0)
MCHC: 32.2 g/dL (ref 30.0–36.0)
MCV: 99.7 fL (ref 78.0–100.0)
Platelets: 158 10*3/uL (ref 150–400)
RBC: 3.98 MIL/uL (ref 3.87–5.11)
RDW: 13.2 % (ref 11.5–15.5)
WBC: 7.9 10*3/uL (ref 4.0–10.5)

## 2015-03-27 MED ORDER — MENTHOL 3 MG MT LOZG: 1.0000 | LOZENGE | OROMUCOSAL | Status: DC | PRN

## 2015-03-27 MED ORDER — ADULT MULTIVITAMIN LIQUID CH
15.0000 mL | Freq: Every day | ORAL | Status: DC
  Administered 2015-03-27 – 2015-03-28 (×2): 15 mL via ORAL
  Filled 2015-03-27 (×2): qty 15

## 2015-03-27 MED ORDER — DEXTROSE 5 % IV SOLN
1000.0000 mg | Freq: Four times a day (QID) | INTRAVENOUS | Status: DC | PRN
  Filled 2015-03-27: qty 10

## 2015-03-27 MED ORDER — ALUM & MAG HYDROXIDE-SIMETH 200-200-20 MG/5ML PO SUSP: 30.0000 mL | Freq: Four times a day (QID) | ORAL | Status: DC | PRN

## 2015-03-27 MED ORDER — FENTANYL CITRATE (PF) 100 MCG/2ML IJ SOLN
25.0000 ug | INTRAMUSCULAR | Status: DC | PRN
Start: 2015-03-27 — End: 2015-03-28
  Administered 2015-03-27: 50 ug via INTRAVENOUS
  Administered 2015-03-27: 25 ug via INTRAVENOUS
  Administered 2015-03-27: 50 ug via INTRAVENOUS
  Filled 2015-03-27 (×4): qty 2

## 2015-03-27 MED ORDER — MULTI-VITAMIN/MINERALS PO TABS: 1.0000 | ORAL_TABLET | ORAL | Status: DC

## 2015-03-27 MED ORDER — LIP MEDEX EX OINT
1.0000 "application " | TOPICAL_OINTMENT | Freq: Two times a day (BID) | CUTANEOUS | Status: DC
  Administered 2015-03-27 (×2): 1 via TOPICAL

## 2015-03-27 MED ORDER — SODIUM CHLORIDE 0.9% FLUSH
3.0000 mL | Freq: Two times a day (BID) | INTRAVENOUS | Status: DC
  Administered 2015-03-27: 3 mL via INTRAVENOUS

## 2015-03-27 MED ORDER — LACTATED RINGERS IV BOLUS (SEPSIS): 1000.0000 mL | Freq: Three times a day (TID) | INTRAVENOUS | Status: DC | PRN

## 2015-03-27 MED ORDER — SODIUM CHLORIDE 0.9 % IV SOLN: 250.0000 mL | INTRAVENOUS | Status: DC | PRN

## 2015-03-27 MED ORDER — SODIUM CHLORIDE 0.9% FLUSH: 3.0000 mL | INTRAVENOUS | Status: DC | PRN

## 2015-03-27 MED ORDER — IOHEXOL 300 MG/ML  SOLN
125.0000 mL | Freq: Once | INTRAMUSCULAR | Status: AC | PRN
  Administered 2015-03-27: 60 mL via ORAL

## 2015-03-27 MED ORDER — PROMETHAZINE HCL 25 MG/ML IJ SOLN: 6.2500 mg | INTRAMUSCULAR | Status: DC | PRN

## 2015-03-27 MED ORDER — ADULT MULTIVITAMIN W/MINERALS CH
1.0000 | ORAL_TABLET | Freq: Every day | ORAL | Status: DC
  Filled 2015-03-27: qty 1

## 2015-03-27 MED ORDER — METOPROLOL TARTRATE 50 MG PO TABS
50.0000 mg | ORAL_TABLET | Freq: Two times a day (BID) | ORAL | Status: DC
  Administered 2015-03-27 – 2015-03-28 (×3): 50 mg via ORAL
  Filled 2015-03-27 (×4): qty 1

## 2015-03-27 MED ORDER — MAGIC MOUTHWASH
15.0000 mL | Freq: Four times a day (QID) | ORAL | Status: DC | PRN
  Filled 2015-03-27: qty 15

## 2015-03-27 MED ORDER — ATORVASTATIN CALCIUM 20 MG PO TABS
20.0000 mg | ORAL_TABLET | Freq: Every day | ORAL | Status: DC
  Administered 2015-03-27 – 2015-03-28 (×2): 20 mg via ORAL
  Filled 2015-03-27 (×2): qty 1

## 2015-03-27 MED ORDER — PHENOL 1.4 % MT LIQD: 2.0000 | OROMUCOSAL | Status: DC | PRN

## 2015-03-27 MED ORDER — DIPHENHYDRAMINE HCL 50 MG/ML IJ SOLN
12.5000 mg | Freq: Four times a day (QID) | INTRAMUSCULAR | Status: DC | PRN
Start: 2015-03-27 — End: 2015-03-28

## 2015-03-27 MED ORDER — METOPROLOL TARTRATE 50 MG PO TABS
50.0000 mg | ORAL_TABLET | Freq: Two times a day (BID) | ORAL | Status: DC
  Filled 2015-03-27 (×2): qty 1

## 2015-03-27 MED ORDER — ACETAMINOPHEN 650 MG RE SUPP: 650.0000 mg | Freq: Four times a day (QID) | RECTAL | Status: DC | PRN

## 2015-03-27 MED ORDER — BUTALBITAL-APAP-CAFFEINE 50-325-40 MG PO TABS
1.0000 | ORAL_TABLET | ORAL | Status: DC | PRN
  Administered 2015-03-27 – 2015-03-28 (×3): 1 via ORAL
  Filled 2015-03-27 (×3): qty 1

## 2015-03-27 MED ORDER — ACETAMINOPHEN 325 MG PO TABS
325.0000 mg | ORAL_TABLET | Freq: Four times a day (QID) | ORAL | Status: DC | PRN
  Administered 2015-03-27: 650 mg via ORAL
  Filled 2015-03-27 (×2): qty 2

## 2015-03-27 MED ORDER — BISACODYL 10 MG RE SUPP: 10.0000 mg | Freq: Two times a day (BID) | RECTAL | Status: DC | PRN

## 2015-03-27 MED ORDER — METOCLOPRAMIDE HCL 5 MG/ML IJ SOLN: 5.0000 mg | Freq: Four times a day (QID) | INTRAMUSCULAR | Status: DC | PRN

## 2015-03-27 MED ORDER — VERAPAMIL HCL 120 MG PO TABS
120.0000 mg | ORAL_TABLET | Freq: Two times a day (BID) | ORAL | Status: DC
  Administered 2015-03-27 – 2015-03-28 (×2): 120 mg via ORAL
  Filled 2015-03-27 (×4): qty 1

## 2015-03-27 MED ORDER — METOPROLOL TARTRATE 1 MG/ML IV SOLN: 5.0000 mg | Freq: Four times a day (QID) | INTRAVENOUS | Status: DC | PRN

## 2015-03-27 MED ORDER — FUROSEMIDE 10 MG/ML IJ SOLN
40.0000 mg | Freq: Once | INTRAMUSCULAR | Status: AC
  Administered 2015-03-27: 40 mg via INTRAVENOUS
  Filled 2015-03-27: qty 4

## 2015-03-27 MED ORDER — POTASSIUM CHLORIDE 20 MEQ/15ML (10%) PO SOLN
10.0000 meq | Freq: Every day | ORAL | Status: DC
  Administered 2015-03-27 – 2015-03-28 (×2): 10 meq via ORAL
  Filled 2015-03-27 (×2): qty 7.5

## 2015-03-27 NOTE — Evaluation (Signed)
Occupational Therapy Evaluation Patient Details Name: Brenda [name] MRN: [id number] DOB: 22-Jul-1940 Today's Date: 03/27/2015    History of Present Illness s/p robotic assisted laparoscopic hiatal hernia and nissen fundoplication.  PMH significant for essential tremor, cervical DDD and anxiety   Clinical Impression   This 75 year old female was admitted for the above. Will follow in acute setting with min guard level goals.  Pt currently needs min A.    Follow Up Recommendations  No OT follow up;Supervision/Assistance - 24 hour    Equipment Recommendations   (likely none)    Recommendations for Other Services       Precautions / Restrictions Precautions Precautions: Fall Restrictions Weight Bearing Restrictions: No      Mobility Bed Mobility               General bed mobility comments: oob; had walked with RN  Transfers Overall transfer level: Needs assistance Equipment used: 1 person hand held assist Transfers: Sit to/from Stand Sit to Stand: Min assist         General transfer comment: for steadying    Balance                                            ADL Overall ADL's : Needs assistance/impaired     Grooming: Oral care;Wash/dry hands;Supervision/safety;Standing   Upper Body Bathing: Set up;Sitting   Lower Body Bathing: Minimal assistance;Sit to/from stand   Upper Body Dressing : Set up;Sitting   Lower Body Dressing: Minimal assistance;Sit to/from stand   Toilet Transfer: Minimal assistance;Ambulation;Comfort height toilet (hand held assistance)   Toileting- Clothing Manipulation and Hygiene: Minimal assistance;Sit to/from stand         General ADL Comments: ambulated to bathroom twice:  brushed teeth, then needed to urinate prior to OT leaving.  Pt is not on normal medication routine to control tremors.  Cued to lean on elbow when brushing teeth to stabilize.  Husband will assist as needed. Pt is able to cross legs for  ADLs  Pt will likely stay on first floor and sponge bathe initially     Vision     Perception     Praxis      Pertinent Vitals/Pain Pain Assessment: No/denies pain (only hurts with deep breaths)     Hand Dominance     Extremity/Trunk Assessment Upper Extremity Assessment Upper Extremity Assessment: Overall WFL for tasks assessed           Communication Communication Communication: No difficulties   Cognition Arousal/Alertness: Awake/alert Behavior During Therapy: WFL for tasks assessed/performed Overall Cognitive Status: Within Functional Limits for tasks assessed                     General Comments       Exercises       Shoulder Instructions      Home Living Family/patient expects to be discharged to:: Private residence Living Arrangements: Spouse/significant other                 Bathroom Shower/Tub: Psychologist, counselling (upstairs)   Firefighter: Standard     Home Equipment: Shower seat          Prior Functioning/Environment Level of Independence: Independent             OT Diagnosis: Generalized weakness   OT Problem List: Decreased strength;Decreased activity tolerance;Impaired balance (  sitting and/or standing);Decreased knowledge of use of DME or AE   OT Treatment/Interventions: Self-care/ADL training;DME and/or AE instruction;Patient/family education;Balance training    OT Goals(Current goals can be found in the care plan section) Acute Rehab OT Goals Patient Stated Goal: return to gym (goes 4x week) OT Goal Formulation: With patient Time For Goal Achievement: 04/03/15 Potential to Achieve Goals: Good ADL Goals Pt Will Perform Lower Body Bathing: with min guard assist;sit to/from stand Pt Will Perform Lower Body Dressing: with min guard assist;sit to/from stand Pt Will Transfer to Toilet: with min guard assist;ambulating;regular height toilet  OT Frequency: Min 2X/week   Barriers to D/C:            Co-evaluation               End of Session    Activity Tolerance: Patient tolerated treatment well Patient left: in chair;with call bell/phone within reach;with chair alarm set   Time: [id number] OT Time Calculation (min): 19 min Charges:  OT General Charges $OT Visit: 1 Procedure OT Evaluation $OT Eval Low Complexity: 1 Procedure G-Codes:    Bence Trapp 03/27/2015, 4:32 PM  Marica OtterMaryellen Proctor Carriker, OTR/L [phone] 03/27/2015

## 2015-03-27 NOTE — Progress Notes (Signed)
Patient had problem voiding last night, tried to pee but nothings coming out Patient states " I don't feel full or pressure on my bladder" , We did bladder scan showing 50 ml or urine, patient try to void again this morning and output was just 25 ml.,Patient is NPO and fluids running 100 gtts/min.

## 2015-03-27 NOTE — Progress Notes (Signed)
Utilization review completed.  

## 2015-03-27 NOTE — Progress Notes (Addendum)
[address] [place], [place] [address] Phone: [phone] FAX: [phone]   Brenda [name] [id number] [birth year]   Assessment  Problem List:   Principal Problem:   Paraesophageal hiatal hernia s/p repair w biologic mesh 03/26/2015 Active Problems:   GERD (gastroesophageal reflux disease) s/p Nissen fundoplication 01/19/1622   1 Day Post-Op  03/26/2015  Procedure(s): XI ROBOTIC ASSISTED LAPAROSCOPIC HIATAL HERNIA REPAIR AND NISSEN FUNDOPLICATION    OK  Plan:  -switch narcotics -IV to medlock & diurese.  IVF bolus PRN if overshoot -wean oxygen as tolerated for hypoxia -UGI Esophagram this AM - adv thins to pureed as tolerated -HTN control - back off if too low -VTE prophylaxis- SCDs, etc -mobilize as tolerated to help recovery - get her up!  PT/OT evals since weak to get up ?HH vs SNF  I updated the patient's status to the patient & the patient's spouse.  D/w her RN, Arrie Aran. Recommendations were made.  Questions were answered.  They expressed understanding & appreciation.   D/C patient from hospital when patient meets criteria (anticipate in 1-2 day(s)):  Tolerating oral intake well Ambulating well Adequate pain control without IV medications Urinating  Having flatus Disposition planning in place   Adin Hector, M.D., F.A.C.S. Gastrointestinal and Minimally Invasive Surgery [facility] [address] [place], [place] [address] [phone] Main / Paging   03/27/2015  Subjective:  Sore - a little edgy w Dilaudid - could not sleep well No nausea Poor UOP  Objective:  Vital signs:  Filed Vitals:   03/26/15 1829 03/26/15 2125 03/27/15 0117 03/27/15 0452  BP: 134/54 130/53 135/59 111/54  Pulse: 99 96 100 105  Temp: 97.7 F (36.5 C) 98.2 F (36.8 C) 97.6 F (36.4 C) 97.9 F (36.6 C)  TempSrc: Oral Oral Oral Oral  Resp: '18 19 18 18  ' Height:      Weight:       SpO2: 96% 95% 96% 100%    Last BM Date: 03/25/15  Intake/Output   Yesterday:  03/08 0701 - 03/09 0700 In: 3286.7 [I.V.:3286.7] Out: 260 [Urine:210; Blood:50] This shift:     Bowel function:  Flatus: y  BM: n  Drain: n/a  Physical Exam:  General: Pt awake/alert/oriented x4 in no acute distress.  Smiling Eyes: PERRL, normal EOM.  Sclera clear.  No icterus Neuro: CN II-XII intact w/o focal sensory/motor deficits. Lymph: No head/neck/groin lymphadenopathy Psych:  No delerium/psychosis/paranoia HENT: Normocephalic, Mucus membranes moist.  No thrush Neck: Supple, No tracheal deviation Chest: No chest wall pain w good excursion CV:  Pulses intact.  Regular rhythm MS: Normal AROM mjr joints.  No obvious deformity Abdomen: Soft.  Nondistended.  Mildly tender at incisions only.  No evidence of peritonitis.  No incarcerated hernias. Ext:  SCDs BLE.  No mjr edema.  No cyanosis Skin: No petechiae / purpura  Results:   Labs: Results for orders placed or performed during the hospital encounter of 03/26/15 (from the past 48 hour(s))  Type and screen [facility]     Status: None   Collection Time: 03/26/15  7:00 AM  Result Value Ref Range   ABO/RH(D) A POS    Antibody Screen NEG    Sample Expiration 03/29/2015   Potassium     Status: None   Collection Time: 03/26/15  7:00 AM  Result Value Ref Range   Potassium 3.9 3.5 - 5.1 mmol/L  ABO/Rh  Status: None   Collection Time: 03/26/15  7:00 AM  Result Value Ref Range   ABO/RH(D) A POS   Basic metabolic panel     Status: Abnormal   Collection Time: 03/27/15  4:01 AM  Result Value Ref Range   Sodium 143 135 - 145 mmol/L   Potassium 4.0 3.5 - 5.1 mmol/L   Chloride 106 101 - 111 mmol/L   CO2 26 22 - 32 mmol/L   Glucose, Bld 204 (H) 65 - 99 mg/dL   BUN 12 6 - 20 mg/dL   Creatinine, Ser 0.58 0.44 - 1.00 mg/dL   Calcium 7.7 (L) 8.9 - 10.3 mg/dL   GFR calc non Af Amer >60 >60 mL/min   GFR calc Af Amer >60  >60 mL/min    Comment: (NOTE) The eGFR has been calculated using the CKD EPI equation. This calculation has not been validated in all clinical situations. eGFR's persistently <60 mL/min signify possible Chronic Kidney Disease.    Anion gap 11 5 - 15  CBC     Status: None   Collection Time: 03/27/15  4:01 AM  Result Value Ref Range   WBC 7.9 4.0 - 10.5 K/uL   RBC 3.98 3.87 - 5.11 MIL/uL   Hemoglobin 12.8 12.0 - 15.0 g/dL   HCT 39.7 36.0 - 46.0 %   MCV 99.7 78.0 - 100.0 fL   MCH 32.2 26.0 - 34.0 pg   MCHC 32.2 30.0 - 36.0 g/dL   RDW 13.2 11.5 - 15.5 %   Platelets 158 150 - 400 K/uL    Imaging / Studies: No results found.  Medications / Allergies: per chart  Antibiotics: Anti-infectives    Start     Dose/Rate Route Frequency Ordered Stop   03/26/15 0635  ceFAZolin (ANCEF) IVPB 2 g/50 mL premix     2 g 100 mL/hr over 30 Minutes Intravenous On call to O.R. 03/26/15 5615 03/26/15 0847        Note: Portions of this report may have been transcribed using voice recognition software. Every effort was made to ensure accuracy; however, inadvertent computerized transcription errors may be present.   Any transcriptional errors that result from this process are unintentional.     Adin Hector, M.D., F.A.C.S. Gastrointestinal and Minimally Invasive Surgery [facility] [address] [place], [place] [address] [phone] Main / Paging   03/27/2015  CARE TEAM:  PCP: Donnie Coffin, MD  Outpatient Care Team: Patient Care Team: L.Donnie Coffin, MD as PCP - General (Family Medicine)  Inpatient Treatment Team: Treatment Team: Attending Provider: Ralene Ok, MD; Consulting Physician: Michael Boston, MD; Registered Nurse: Arminda Resides, RN; Technician: Coralie Carpen, NT

## 2015-03-28 LAB — HEMOGLOBIN A1C
HEMOGLOBIN A1C: 6.3 % — AB (ref 4.8–5.6)
Mean Plasma Glucose: 134 mg/dL

## 2015-03-28 LAB — MAGNESIUM: MAGNESIUM: 1.5 mg/dL — AB (ref 1.7–2.4)

## 2015-03-28 LAB — CREATININE, SERUM
CREATININE: 0.61 mg/dL (ref 0.44–1.00)
GFR calc Af Amer: >60 mL/min (ref >60)

## 2015-03-28 LAB — POTASSIUM: Potassium: 3.6 mmol/L (ref 3.5–5.1)

## 2015-03-28 MED ORDER — LOPERAMIDE-SIMETHICONE 2-125 MG PO CHEW: CHEWABLE_TABLET | Freq: Four times a day (QID) | ORAL | Status: DC | PRN

## 2015-03-28 MED ORDER — SIMETHICONE 40 MG/0.6ML PO SUSP
120.0000 mg | Freq: Four times a day (QID) | ORAL | Status: DC | PRN
  Filled 2015-03-28: qty 1.8

## 2015-03-28 MED ORDER — CALCIUM CARBONATE 1250 (500 CA) MG PO TABS: 1.0000 | ORAL_TABLET | Freq: Every day | ORAL | Status: DC

## 2015-03-28 MED ORDER — PANTOPRAZOLE SODIUM 40 MG PO TBEC: 80.0000 mg | DELAYED_RELEASE_TABLET | Freq: Every day | ORAL | Status: DC

## 2015-03-28 MED ORDER — MAGNESIUM SULFATE 2 GM/50ML IV SOLN
2.0000 g | Freq: Once | INTRAVENOUS | Status: DC
  Filled 2015-03-28: qty 50

## 2015-03-28 MED ORDER — VERAPAMIL HCL 120 MG PO TABS: 120.0000 mg | ORAL_TABLET | Freq: Two times a day (BID) | ORAL | Status: DC

## 2015-03-28 MED ORDER — CHOLECALCIFEROL 400 UNIT/ML PO LIQD: 100.0000 [IU] | Freq: Every day | ORAL | Status: DC

## 2015-03-28 MED ORDER — LOPERAMIDE HCL 2 MG PO CAPS: 2.0000 mg | ORAL_CAPSULE | Freq: Four times a day (QID) | ORAL | Status: DC | PRN

## 2015-03-28 MED ORDER — MAGNESIUM OXIDE 400 (241.3 MG) MG PO TABS: 200.0000 mg | ORAL_TABLET | Freq: Every day | ORAL | Status: DC

## 2015-03-28 MED ORDER — CALCIUM-MAGNESIUM-VITAMIN D 500-250-125 MG-MG-UNIT PO TABS: 1.0000 | ORAL_TABLET | ORAL | Status: DC

## 2015-03-28 NOTE — Progress Notes (Signed)
Discharge instructions and prescription given to patient.  Questions answered 

## 2015-03-28 NOTE — Discharge Summary (Signed)
Physician Discharge Summary  Patient ID: Brenda [name] MRN: [id number] DOB/AGE: [birth year] 75 y.o.  Admit date: 03/26/2015 Discharge date: 03/28/2015  Patient Care Team: L.Donnie Coffin, MD as PCP - General (Family Medicine)  Admission Diagnoses: Principal Problem:   Paraesophageal hiatal hernia s/p repair w biologic mesh 03/26/2015 Active Problems:   GERD (gastroesophageal reflux disease) s/p Nissen fundoplication 06/19/9507   Discharge Diagnoses:  Principal Problem:   Paraesophageal hiatal hernia s/p repair w biologic mesh 03/26/2015 Active Problems:   GERD (gastroesophageal reflux disease) s/p Nissen fundoplication 03/20/6710   POST-OPERATIVE DIAGNOSIS:   HIATAL HERNIA   SURGERY:  03/26/2015  Procedure(s): XI ROBOTIC ASSISTED LAPAROSCOPIC HIATAL HERNIA REPAIR AND NISSEN FUNDOPLICATION  SURGEON:    Surgeon(s): Ralene Ok, MD Michael Boston, MD - assist  Consults: None  Hospital Course:   The patient underwent the surgery above.  Postoperatively, the patient gradually mobilized.  UGI eval showed no leak/obstruction, so the patient advanced gradually to a pureed diet.  Pain and other symptoms were treated aggressively.  Low Mag replaced.  Diuresed    By the time of discharge, the patient was walking well the hallways, eating food, having flatus.  Loose BMs controlled.  Pain was well-controlled on an oral medications.  Based on meeting discharge criteria and continuing to recover, I felt it was safe for the patient to be discharged from the hospital to further recover with close followup. Postoperative recommendations were discussed in detail.  They are written as well.   Significant Diagnostic Studies:  Results for orders placed or performed during the hospital encounter of 03/26/15 (from the past 72 hour(s))  Type and screen [facility]     Status: None   Collection Time: 03/26/15  7:00 AM  Result Value Ref Range   ABO/RH(D) A POS    Antibody Screen  NEG    Sample Expiration 03/29/2015   Potassium     Status: None   Collection Time: 03/26/15  7:00 AM  Result Value Ref Range   Potassium 3.9 3.5 - 5.1 mmol/L  ABO/Rh     Status: None   Collection Time: 03/26/15  7:00 AM  Result Value Ref Range   ABO/RH(D) A POS   Basic metabolic panel     Status: Abnormal   Collection Time: 03/27/15  4:01 AM  Result Value Ref Range   Sodium 143 135 - 145 mmol/L   Potassium 4.0 3.5 - 5.1 mmol/L   Chloride 106 101 - 111 mmol/L   CO2 26 22 - 32 mmol/L   Glucose, Bld 204 (H) 65 - 99 mg/dL   BUN 12 6 - 20 mg/dL   Creatinine, Ser 0.58 0.44 - 1.00 mg/dL   Calcium 7.7 (L) 8.9 - 10.3 mg/dL   GFR calc non Af Amer >60 >60 mL/min   GFR calc Af Amer >60 >60 mL/min    Comment: (NOTE) The eGFR has been calculated using the CKD EPI equation. This calculation has not been validated in all clinical situations. eGFR's persistently <60 mL/min signify possible Chronic Kidney Disease.    Anion gap 11 5 - 15  CBC     Status: None   Collection Time: 03/27/15  4:01 AM  Result Value Ref Range   WBC 7.9 4.0 - 10.5 K/uL   RBC 3.98 3.87 - 5.11 MIL/uL   Hemoglobin 12.8 12.0 - 15.0 g/dL   HCT 39.7 36.0 - 46.0 %   MCV 99.7 78.0 - 100.0 fL   MCH 32.2 26.0 - 34.0  pg   MCHC 32.2 30.0 - 36.0 g/dL   RDW 13.2 11.5 - 15.5 %   Platelets 158 150 - 400 K/uL  Hemoglobin A1c     Status: Abnormal   Collection Time: 03/27/15  4:01 AM  Result Value Ref Range   Hgb A1c MFr Bld 6.3 (H) 4.8 - 5.6 %    Comment: (NOTE)         Pre-diabetes: 5.7 - 6.4         Diabetes: >6.4         Glycemic control for adults with diabetes: <7.0    Mean Plasma Glucose 134 mg/dL    Comment: (NOTE) Performed At: [facility] [place], [place] [id number] Lindon Romp MD YD:[id number]   Creatinine, serum     Status: None   Collection Time: 03/28/15  3:54 AM  Result Value Ref Range   Creatinine, Ser 0.61 0.44 - 1.00 mg/dL   GFR calc non Af Amer >60 >60 mL/min   GFR  calc Af Amer >60 >60 mL/min    Comment: (NOTE) The eGFR has been calculated using the CKD EPI equation. This calculation has not been validated in all clinical situations. eGFR's persistently <60 mL/min signify possible Chronic Kidney Disease.   Potassium     Status: None   Collection Time: 03/28/15  3:54 AM  Result Value Ref Range   Potassium 3.6 3.5 - 5.1 mmol/L  Magnesium     Status: Abnormal   Collection Time: 03/28/15  3:54 AM  Result Value Ref Range   Magnesium 1.5 (L) 1.7 - 2.4 mg/dL    Dg Esophagus W/water Sol Cm  03/27/2015  CLINICAL DATA:  Status post Nissen fundoplication for hiatal hernia. EXAM: ESOPHOGRAM/BARIUM SWALLOW TECHNIQUE: Single contrast examination was performed using  water-soluble. FLUOROSCOPY TIME:  Radiation Exposure Index (as provided by the fluoroscopic device): If the device does not provide the exposure index: Fluoroscopy Time:  38 seconds Number of Acquired Images:  7 COMPARISON:  None. FINDINGS: The fundoplication wrap appears intact. No extravasation of contrast material identified to suggest leakage. No reflux identified during the exam. IMPRESSION: 1. No evidence for leakage status post  Nissen fundoplication wrap. Electronically Signed   By: Kerby Moors M.D.   On: 03/27/2015 14:21    Discharge Exam: Blood pressure 130/64, pulse 81, temperature 98.6 F (37 C), temperature source Oral, resp. rate 18, height '5\' 3"'  (1.6 m), weight 51.71 kg (114 lb), SpO2 93 %.  General: Pt awake/alert/oriented x4 in no major acute distress.  Smiling alert Eyes: PERRL, normal EOM. Sclera nonicteric Neuro: CN II-XII intact w/o focal sensory/motor deficits. Lymph: No head/neck/groin lymphadenopathy Psych:  No delerium/psychosis/paranoia HENT: Normocephalic, Mucus membranes moist.  No thrush Neck: Supple, No tracheal deviation Chest: No pain.  Good respiratory excursion. CV:  Pulses intact.  Regular rhythm MS: Normal AROM mjr joints.  No obvious deformity Abdomen:  Soft, Nondistended.  Incisions c/d/i.  Nontender.  No incarcerated hernias. Ext:  SCDs BLE.  No significant edema.  No cyanosis Skin: No petechiae / purpura  Discharged Condition: good   Past Medical History  Diagnosis Date  . Migraines   . Hypertension   . DDD (degenerative disc disease), cervical   . Tremor   . Hypercholesteremia   . Osteoporosis   . Colon polyp   . Hiatal hernia   . Anxiety   . Shortness of breath dyspnea   . History of skin cancer   . Chronic diarrhea   .  Weight loss     Past Surgical History  Procedure Laterality Date  . Sinus surgery with instatrak    . Rhinoplasty    . Esophageal manometry N/A 01/15/2015    Procedure: ESOPHAGEAL MANOMETRY (EM);  Surgeon: Wonda Horner, MD;  Location: [facility];  Service: Endoscopy;  Laterality: N/A;    Social History   Social History  . Marital Status: Married    Spouse Name: N/A  . Number of Children: 0  . Years of Education: MA   Occupational History  . Retired    Social History Main Topics  . Smoking status: Former Smoker    Quit date: 09/11/1988  . Smokeless tobacco: Not on file  . Alcohol Use: 0.0 oz/week    0 Standard drinks or equivalent per week     Comment: OCCASIONAL  . Drug Use: No  . Sexual Activity: Not on file   Other Topics Concern  . Not on file   Social History Narrative   Denies any caffeine use     Family History  Problem Relation Age of Onset  . Lung cancer Mother   . COPD Father   . Hypertension Brother     Current Facility-Administered Medications  Medication Dose Route Frequency Provider Last Rate Last Dose  . 0.9 %  sodium chloride infusion  250 mL Intravenous PRN Michael Boston, MD      . acetaminophen (TYLENOL) suppository 650 mg  650 mg Rectal Q6H PRN Michael Boston, MD      . acetaminophen (TYLENOL) tablet 325-650 mg  325-650 mg Oral Q6H PRN Michael Boston, MD   650 mg at 03/27/15 2020  . alum & mag hydroxide-simeth (MAALOX/MYLANTA) 200-200-20 MG/5ML suspension 30  mL  30 mL Oral Q6H PRN Michael Boston, MD      . atorvastatin (LIPITOR) tablet 20 mg  20 mg Oral Daily Michael Boston, MD   20 mg at 03/27/15 1422  . bisacodyl (DULCOLAX) suppository 10 mg  10 mg Rectal Q12H PRN Michael Boston, MD      . butalbital-acetaminophen-caffeine (FIORICET, ESGIC) [phone] MG per tablet 1 tablet  1 tablet Oral Q4H PRN Michael Boston, MD   1 tablet at 03/28/15 0433  . diphenhydrAMINE (BENADRYL) injection 12.5-25 mg  12.5-25 mg Intravenous Q6H PRN Michael Boston, MD      . enoxaparin (LOVENOX) injection 40 mg  40 mg Subcutaneous Q24H Ralene Ok, MD   40 mg at 03/27/15 0937  . fentaNYL (SUBLIMAZE) injection 25-50 mcg  25-50 mcg Intravenous Q1H PRN Michael Boston, MD   50 mcg at 03/27/15 1434  . hydrALAZINE (APRESOLINE) injection 10 mg  10 mg Intravenous Q2H PRN Ralene Ok, MD      . hydrochlorothiazide (HYDRODIURIL) tablet 25 mg  25 mg Oral Daily Ralene Ok, MD   25 mg at 03/26/15 1600  . lactated ringers bolus 1,000 mL  1,000 mL Intravenous Q8H PRN Michael Boston, MD      . lip balm (CARMEX) ointment 1 application  1 application Topical BID Michael Boston, MD   1 application at 64/15/83 2225  . magic mouthwash  15 mL Oral QID PRN Michael Boston, MD      . magnesium sulfate IVPB 2 g 50 mL  2 g Intravenous Once Michael Boston, MD      . menthol-cetylpyridinium (CEPACOL) lozenge 3 mg  1 lozenge Oral PRN Michael Boston, MD      . methocarbamol (ROBAXIN) 1,000 mg in dextrose 5 % 50 mL IVPB  1,000 mg  Intravenous Q6H PRN Michael Boston, MD      . metoCLOPramide (REGLAN) injection 5-10 mg  5-10 mg Intravenous Q6H PRN Michael Boston, MD      . metoprolol (LOPRESSOR) injection 5 mg  5 mg Intravenous Q6H PRN Michael Boston, MD      . metoprolol (LOPRESSOR) tablet 50 mg  50 mg Oral BID Ralene Ok, MD   50 mg at 03/27/15 2140  . multivitamin liquid 15 mL  15 mL Oral Daily Ralene Ok, MD   15 mL at 03/27/15 1424  . ondansetron (ZOFRAN-ODT) disintegrating tablet 4 mg  4 mg Oral Q6H PRN  Ralene Ok, MD       Or  . ondansetron [facility]) injection 4 mg  4 mg Intravenous Q6H PRN Ralene Ok, MD      . phenol (CHLORASEPTIC) mouth spray 2 spray  2 spray Mouth/Throat PRN Michael Boston, MD      . potassium chloride 20 MEQ/15ML (10%) solution 10 mEq  10 mEq Oral Daily Ralene Ok, MD   10 mEq at 03/27/15 1422  . primidone (MYSOLINE) tablet 100 mg  100 mg Oral QID Ralene Ok, MD   100 mg at 03/27/15 2139  . promethazine (PHENERGAN) injection 6.25-12.5 mg  6.25-12.5 mg Intravenous Q4H PRN Michael Boston, MD      . simethicone [facility]) chewable tablet 40 mg  40 mg Oral Q6H PRN Ralene Ok, MD      . sodium chloride flush (NS) 0.9 % injection 3 mL  3 mL Intravenous Q12H Michael Boston, MD   3 mL at 03/27/15 2142  . sodium chloride flush (NS) 0.9 % injection 3 mL  3 mL Intravenous PRN Michael Boston, MD      . verapamil (CALAN) tablet 120 mg  120 mg Oral Q12H Michael Boston, MD   120 mg at 03/27/15 1733     Allergies  Allergen Reactions  . Macrodantin [Nitrofurantoin Macrocrystal] Nausea And Vomiting    Tongue swelling  . Codeine Nausea And Vomiting    Disposition: 01-Home or Self Care  Discharge Instructions    Call MD for:  extreme fatigue    Complete by:  As directed      Call MD for:  hives    Complete by:  As directed      Call MD for:  persistant nausea and vomiting    Complete by:  As directed      Call MD for:  redness, tenderness, or signs of infection (pain, swelling, redness, odor or green/yellow discharge around incision site)    Complete by:  As directed      Call MD for:  severe uncontrolled pain    Complete by:  As directed      Call MD for:    Complete by:  As directed   Temperature > 101.57F     Diet general    Complete by:  As directed   See separate diet sheet.  Pureed/blenderized diet for the first few weeks.     Discharge instructions    Complete by:  As directed   Please see discharge instruction sheets.  Also refer to handout given an  office.  Please call our office if you have any questions or concerns [phone]     Discharge wound care:    Complete by:  As directed   If you have closed incisions, shower and bathe over these incisions with soap and water every day.  Remove all surgical dressings on postoperative day #3.  You do not need to replace dressings over the closed incisions unless you feel more comfortable with a Band-Aid covering it.   If you have an open wound that requires packing, please see wound care instructions.  In general, remove all dressings, wash wound with soap and water and then replace with saline moistened gauze.  Do the dressing change at least every day.  Please call our office [phone] if you have further questions.     Driving Restrictions    Complete by:  As directed   No driving until off narcotics and can safely swerve away without pain during an emergency     Increase activity slowly    Complete by:  As directed   Walk an hour a day.  Use 20-30 minute walks.  When you can walk 30 minutes without difficulty, it is fine to restart low impact/moderate activities such as biking, jogging, swimming, sexual activity, etc.  Eventually you can increase to unrestricted activity when not feeling pain.  If you feel pain: STOP!Marland Kitchen   Let pain protect you from overdoing it.  Use ice/heat & over-the-counter pain medications to help minimize soreness.  If that is not enough, then use your narcotic pain prescription as needed to remain active.  It is better to take extra pain medications and be more active than to stay bedridden to avoid all pain medications.     Lifting restrictions    Complete by:  As directed   Avoid heavy lifting initially.  Do not push through pain.  You have no specific weight limit - if it hurts to do, DON'T DO IT.   If you feel no pain, you are not injuring anything.  Pain will protect you from injury.  Coughing and sneezing are far more stressful to your incision than any lifting.   Avoid resuming heavy lifting / intense activity until off all narcotic pain medications.  When ready to exercise more, give yourself 2 weeks to gradually get back to full intense exercise/activity.     May shower / Bathe    Complete by:  As directed      May walk up steps    Complete by:  As directed      Sexual Activity Restrictions    Complete by:  As directed   Sexual activity as tolerated.  Do not push through pain.  Pain will protect you from injury.     Walk with assistance    Complete by:  As directed   Walk over an hour a day.  May use a walker/cane/companion to help with balance and stamina.            Medication List    TAKE these medications        atorvastatin 20 MG tablet  Commonly known as:  LIPITOR  Take 20 mg by mouth every morning.     Calcium-Magnesium-Vitamin D [phone] MG-MG-UNIT Tabs  Take 1 tablet by mouth every morning.     ESGIC PO  Take 2 tablets by mouth daily as needed (headache).     esomeprazole 40 MG capsule  Commonly known as:  NEXIUM  Take 40 mg by mouth every morning.     hydrochlorothiazide 25 MG tablet  Commonly known as:  HYDRODIURIL  Take 25 mg by mouth every morning.     HYDROcodone-acetaminophen 7.5-325 mg/15 ml solution  Commonly known as:  HYCET  Take 10 mLs by mouth 4 (four) times daily as needed for moderate pain.  ibuprofen 200 MG tablet  Commonly known as:  ADVIL,MOTRIN  Take 200 mg by mouth every 6 (six) hours as needed for pain.     IMODIUM MULTI-SYMPTOM RELIEF PO  Take 1 tablet by mouth every 6 (six) hours as needed (for diarrhea).     metoprolol 50 MG tablet  Commonly known as:  LOPRESSOR  Take 50 mg by mouth 2 (two) times daily.     multivitamin with minerals tablet  Take 1 tablet by mouth every morning.     potassium chloride 10 MEQ tablet  Commonly known as:  K-DUR,KLOR-CON  Take 10 mEq by mouth every morning.     primidone 50 MG tablet  Commonly known as:  MYSOLINE  Take 100 mg by mouth 4 (four)  times daily.     topiramate 50 MG tablet  Commonly known as:  TOPAMAX  1 pill each night x 1 week, then 1 pill 2 times daily x 1 week, then 1 pill in AM and 2 at night x 1 week, then 2 pills 2 times daily.     verapamil 240 MG CR tablet  Commonly known as:  CALAN-SR  Take 240 mg by mouth every morning.     verapamil 120 MG tablet  Commonly known as:  CALAN  Take 1 tablet (120 mg total) by mouth 2 (two) times daily.           Follow-up Information    Follow up with Reyes Ivan, MD In 3 weeks.   Specialty:  General Surgery   Why:  For wound re-check   Contact information:   [address] [place] [address] [phone]        Signed: Morton Peters, M.D., F.A.C.S. Gastrointestinal and Minimally Invasive Surgery [facility] [address], [place], [place] [address] [phone] Main / Paging   03/28/2015, 8:37 AM

## 2015-03-28 NOTE — Discharge Instructions (Signed)
EATING AFTER YOUR ESOPHAGEAL SURGERY °(Stomach Fundoplication, Hiatal Hernia repair, Achalasia surgery, etc) ° °After your esophageal surgery, expect some sticking with swallowing over the next 1-2 months.   ° °If food sticks when you eat, it is called "dysphagia".  This is due to swelling around your esophagus at the wrap & hiatal diaphragm repair.  It will gradually ease off over the next few months.  To help you through this temporary phase, we start you out on a pureed (blenderized) diet. ° °Your first meal in the hospital was thin liquids.  You should have been given a pureed diet by the time you left the hospital.  We ask patients to stay on a pureed diet for the first 2-3 weeks to avoid anything getting "stuck" near your recent surgery.  Don't be alarmed if your ability to swallow doesn't progress according to this plan.  Everyone is different and some diets can advance more or less quickly.   ° ° °Some BASIC RULES to follow are: °· Maintain an upright position whenever eating or drinking. °· Take small bites - just a teaspoon size bite at a time. °· Eat slowly.  It may also help to eat only one food at a time. °· Consider nibbling through smaller, more frequent meals & avoid the urge to eat BIG meals °· Do not push through feelings of fullness, nausea, or bloatedness °· Do not mix solid foods and liquids in the same mouthful °· Try not to "wash foods down" with large gulps of liquids. °· Avoid carbonated (bubbly/fizzy) drinks.   °· Avoid foods that make you feel gassy or bloated.  Start with bland foods first.  Wait on trying greasy, fried, or spicy meals until you are tolerating more bland solids well. °· Understand that it will be hard to burp and belch at first.  This gradually improves with time.  Expect to be more gassy/flatulent/bloated initially.  Walking will help your body manage it better. °· Consider using medications for bloating that contain simethicone such as  Maalox or Gas-X  °· Eat in a  relaxed atmosphere & minimize distractions. °· Avoid talking while eating.   °· Do not use straws. °· Following each meal, sit in an upright position (90 degree angle) for 60 to 90 minutes.  Going for a short walk can help as well °· If food does stick, don't panic.  Try to relax and let the food pass on its own.  Sipping WARM LIQUID such as strong hot black tea can also help slide it down. ° ° °Be gradual in changes & use common sense: ° °-If you easily tolerating a certain "level" of foods, advance to the next level gradually °-If you are having trouble swallowing a particular food, then avoid it.   °-If food is sticking when you advance your diet, go back to thinner previous diet (the lower LEVEL) for 1-2 days. ° °LEVEL 1 = PUREED DIET ° °Do for the first 2 WEEKS AFTER SURGERY ° °-Foods in this group are pureed or blenderized to a smooth, mashed potato-like consistency.  °-If necessary, the pureed foods can keep their shape with the addition of a thickening agent.   °-Meat should be pureed to a smooth, pasty consistency.  Hot broth or gravy may be added to the pureed meat, approximately 1 oz. of liquid per 3 oz. serving of meat. °-CAUTION:  If any foods do not puree into a smooth consistency, swallowing will be more difficult.  (For example, nuts   or seeds sometimes do not blend well.) ° °Hot Foods Cold Foods  °Pureed scrambled eggs and cheese Pureed cottage cheese  °Baby cereals Thickened juices and nectars  °Thinned cooked cereals (no lumps) Thickened milk or eggnog  °Pureed French toast or pancakes Ensure  °Mashed potatoes Ice cream  °Pureed parsley, au gratin, scalloped potatoes, candied sweet potatoes Fruit or Italian ice, sherbet  °Pureed buttered or alfredo noodles Plain yogurt  °Pureed vegetables (no corn or peas) Instant breakfast  °Pureed soups and creamed soups Smooth pudding, mousse, custard  °Pureed scalloped apples Whipped gelatin  °Gravies Sugar, syrup, honey, jelly  °Sauces, cheese, tomato,  barbecue, white, creamed Cream  °Any baby food Creamer  °Alcohol in moderation (not beer or champagne) Margarine  °Coffee or tea Mayonnaise  ° Ketchup, mustard  ° Apple sauce  ° °SAMPLE MENU:  PUREED DIET °Breakfast Lunch Dinner  °· Orange juice, 1/2 cup °· Cream of wheat, 1/2 cup · Pineapple juice, 1/2 cup · Pureed turkey, barley soup, 3/4 cup °· Pureed Hawaiian chicken, 3 oz  °· Scrambled eggs, mashed or blended with cheese, 1/2 cup °· Tea or coffee, 1 cup  °· Whole milk, 1 cup  °· Non-dairy creamer, 2 Tbsp. · Mashed potatoes, 1/2 cup °· Pureed cooled broccoli, 1/2 cup °· Apple sauce, 1/2 cup °· Coffee or tea · Mashed potatoes, 1/2 cup °· Pureed spinach, 1/2 cup °· Frozen yogurt, 1/2 cup °· Tea or coffee  ° ° ° ° °LEVEL 2 = SOFT DIET ° °After your first 2 weeks, you can advance to a soft diet.   °Keep on this diet until everything goes down easily. ° °Hot Foods Cold Foods  °White fish Cottage cheese  °Stuffed fish Junior baby fruit  °Baby food meals Semi thickened juices  °Minced soft cooked, scrambled, poached eggs nectars  °Souffle & omelets Ripe mashed bananas  °Cooked cereals Canned fruit, pineapple sauce, milk  °potatoes Milkshake  °Buttered or Alfredo noodles Custard  °Cooked cooled vegetable Puddings, including tapioca  °Sherbet Yogurt  °Vegetable soup or alphabet soup Fruit ice, Italian ice  °Gravies Whipped gelatin  °Sugar, syrup, honey, jelly Junior baby desserts  °Sauces:  Cheese, creamed, barbecue, tomato, white Cream  °Coffee or tea Margarine  ° °SAMPLE MENU:  LEVEL 2 °Breakfast Lunch Dinner  °· Orange juice, 1/2 cup °· Oatmeal, 1/2 cup °· Scrambled eggs with cheese, 1/2 cup °· Decaffeinated tea, 1 cup °· Whole milk, 1 cup °· Non-dairy creamer, 2 Tbsp · Pineapple juice, 1/2 cup °· Minced beef, 3 oz °· Gravy, 2 Tbsp °· Mashed potatoes, 1/2 cup °· Minced fresh broccoli, 1/2 cup °· Applesauce, 1/2 cup °· Coffee, 1 cup · Turkey, barley soup, 3/4 cup °· Minced Hawaiian chicken, 3 oz °· Mashed potatoes, 1/2  cup °· Cooked spinach, 1/2 cup °· Frozen yogurt, 1/2 cup °· Non-dairy creamer, 2 Tbsp  ° ° ° ° °LEVEL 3 = CHOPPED DIET ° °-After all the foods in level 2 (soft diet) are passing through well you should advance up to more chopped foods.  °-It is still important to cut these foods into small pieces and eat slowly. ° °Hot Foods Cold Foods  °Poultry Cottage cheese  °Chopped Swedish meatballs Yogurt  °Meat salads (ground or flaked meat) Milk  °Flaked fish (tuna) Milkshakes  °Poached or scrambled eggs Soft, cold, dry cereal  °Souffles and omelets Fruit juices or nectars  °Cooked cereals Chopped canned fruit  °Chopped French toast or pancakes Canned fruit cocktail  °Noodles or   pasta (no rice) Pudding, mousse, custard  °Cooked vegetables (no frozen peas, corn, or mixed vegetables) Green salad  °Canned small sweet peas Ice cream  °Creamed soup or vegetable soup Fruit ice, Italian ice  °Pureed vegetable soup or alphabet soup Non-dairy creamer  °Ground scalloped apples Margarine  °Gravies Mayonnaise  °Sauces:  Cheese, creamed, barbecue, tomato, white Ketchup  °Coffee or tea Mustard  ° °SAMPLE MENU:  LEVEL 3 °Breakfast Lunch Dinner  °· Orange juice, 1/2 cup °· Oatmeal, 1/2 cup °· Scrambled eggs with cheese, 1/2 cup °· Decaffeinated tea, 1 cup °· Whole milk, 1 cup °· Non-dairy creamer, 2 Tbsp °· Ketchup, 1 Tbsp °· Margarine, 1 tsp °· Salt, 1/4 tsp °· Sugar, 2 tsp · Pineapple juice, 1/2 cup °· Ground beef, 3 oz °· Gravy, 2 Tbsp °· Mashed potatoes, 1/2 cup °· Cooked spinach, 1/2 cup °· Applesauce, 1/2 cup °· Decaffeinated coffee °· Whole milk °· Non-dairy creamer, 2 Tbsp °· Margarine, 1 tsp °· Salt, 1/4 tsp · Pureed turkey, barley soup, 3/4 cup °· Barbecue chicken, 3 oz °· Mashed potatoes, 1/2 cup °· Ground fresh broccoli, 1/2 cup °· Frozen yogurt, 1/2 cup °· Decaffeinated tea, 1 cup °· Non-dairy creamer, 2 Tbsp °· Margarine, 1 tsp °· Salt, 1/4 tsp °· Sugar, 1 tsp  ° ° °LEVEL 4:  REGULAR FOODS ° °-Foods in this group are soft,  moist, regularly textured foods.   °-This level includes meat and breads, which tend to be the hardest things to swallow.   °-Eat very slowly, chew well and continue to avoid carbonated drinks. °-most people are at this level in 4-6 weeks ° °Hot Foods Cold Foods  °Baked fish or skinned Soft cheeses - cottage cheese  °Souffles and omelets Cream cheese  °Eggs Yogurt  °Stuffed shells Milk  °Spaghetti with meat sauce Milkshakes  °Cooked cereal Cold dry cereals (no nuts, dried fruit, coconut)  °French toast or pancakes Crackers  °Buttered toast Fruit juices or nectars  °Noodles or pasta (no rice) Canned fruit  °Potatoes (all types) Ripe bananas  °Soft, cooked vegetables (no corn, lima, or baked beans) Peeled, ripe, fresh fruit  °Creamed soups or vegetable soup Cakes (no nuts, dried fruit, coconut)  °Canned chicken noodle soup Plain doughnuts  °Gravies Ice cream  °Bacon dressing Pudding, mousse, custard  °Sauces:  Cheese, creamed, barbecue, tomato, white Fruit ice, Italian ice, sherbet  °Decaffeinated tea or coffee Whipped gelatin  °Pork chops Regular gelatin  ° Canned fruited gelatin molds  ° Sugar, syrup, honey, jam, jelly  ° Cream  ° Non-dairy  ° Margarine  ° Oil  ° Mayonnaise  ° Ketchup  ° Mustard  ° °TROUBLESHOOTING IRREGULAR BOWELS  °1) Avoid extremes of bowel movements (no bad constipation/diarrhea)  °2) Miralax 17gm mixed in 8oz. water or juice-daily. May use BID as needed.  °3) Gas-x,Phazyme, etc. as needed for gas & bloating.  °4) Soft,bland diet. No spicy,greasy,fried foods.  °5) Prilosec over-the-counter as needed  °6) May hold gluten/wheat products from diet to see if symptoms improve.  °7) May try probiotics (Align, Activa, etc) to help calm the bowels down  °7) If symptoms become worse call back immediately. ° ° ° °If you have any questions please call our office at [facility]: [phone]. ° °

## 2015-06-03 ENCOUNTER — Encounter: Payer: Self-pay | Admitting: Neurology

## 2015-06-03 ENCOUNTER — Ambulatory Visit (INDEPENDENT_AMBULATORY_CARE_PROVIDER_SITE_OTHER): Payer: Medicare Other | Admitting: Neurology

## 2015-06-03 VITALS — BP 134/78 | HR 72 | Resp 16 | Ht 63.0 in | Wt 110.0 lb

## 2015-06-03 DIAGNOSIS — Z9889 Other specified postprocedural states: Secondary | ICD-10-CM

## 2015-06-03 DIAGNOSIS — K529 Noninfective gastroenteritis and colitis, unspecified: Secondary | ICD-10-CM

## 2015-06-03 DIAGNOSIS — Z8719 Personal history of other diseases of the digestive system: Secondary | ICD-10-CM

## 2015-06-03 DIAGNOSIS — G250 Essential tremor: Secondary | ICD-10-CM

## 2015-06-03 MED ORDER — PRIMIDONE 50 MG PO TABS: 100.0000 mg | ORAL_TABLET | Freq: Three times a day (TID) | ORAL | Status: DC

## 2015-06-03 NOTE — Progress Notes (Signed)
Subjective:    Brenda [name] ID: Brenda [name] is a 75 y.o. female.  HPI     Interim history:   Brenda [name] is a 75 year old left-handed woman with an underlying medical history of migraines, hypertension, degenerative disc disease of the cervical spine, sinus disease, status post sinus surgery, osteoporosis, on probably a injections, allergies, former smoking, and hiatal hernia, who presents for follow-up consultation of Brenda [name] upper extremity tremors. The Brenda [name] is accompanied by Brenda [name] husband today. I last saw Brenda [name] on 03/06/2015, at which time Brenda [name] reported that Brenda [name] had recently stopped the Topamax because of side effects including change in Brenda [name] taste, dizzy spells, mental fogginess and weight loss. Brenda [name] did not feel that it helped or tremor. Brenda [name] reported a recent fall on linoleum floor in the kitchen. Thankfully, Brenda [name] did not hit Brenda [name] head but braced Brenda [name] fall with Brenda [name] left forearm but somehow cut Brenda [name] lower lip. I suggested a 3 month checkup, we talked about potentially  trying long-acting topiramate.  Today, 06/03/2015: Brenda [name] reports doing about the same, off of Topamax d/t SEs. In the interim, Brenda [name] had a robotic-assisted hiatal hernia surgery on 03/26/2015 with good success, as far as the surgery itself goes, but Brenda [name] had to advance Brenda [name] diet very slowly and that contributed to further weight loss. Unfortunately, weight has not yet been stabilized. Brenda [name] continues to have diarrhea which has been a chronic problem, for which Brenda [name] has tried Imodium and Questran. Brenda [name] has had limited success with that. Diarrhea is a daily problem for Brenda [name]. Brenda [name] tries to eat things that Brenda [name] can pick up with Brenda [name] fingers, sometimes Brenda [name] husband has to feed Brenda [name]. Brenda [name] is tearful today because of all these issues and not having control over Brenda [name] tremors. When Brenda [name] has to go out, Brenda [name] takes Brenda [name] second dose of Mysoline a little earlier and also admits to drinking some wine to tone down the tremor. Brenda [name] admits that Brenda [name] may have been drinking more lately. Brenda [name] is  somewhat demoralized at this time, Brenda [name] is not able to go on Brenda [name] church trip to [place] this summer, Brenda [name] feels that Brenda [name] does not have it in Brenda [name] at this time.    Previously:  I first met Brenda [name] on 12/26/2014 at the request of Brenda [name] primary care physician, at which time Brenda [name] reported a long-standing history of upper extremity tremors with symptoms dating back to Brenda [name] teenage years with gradual progression. Eventually, Brenda [name] developed a hand and voice tremor. Brenda [name] reported a family history of tremors as well. Brenda [name] was on metoprolol and primidone at the time. We talked about potentially pursuing a consultation for DBS. I suggested a trial of Topamax with gradual titration. I asked Brenda [name] to limit Brenda [name] alcohol consumption.   12/26/2014: Brenda [name] reports a long-standing history of tremors of Brenda [name] upper extremities. Symptoms started in Brenda [name] teens, but were very mild and intermittent for years. Gradually Brenda [name] became worse. In the past 2 years Brenda [name] hasnoted significant worsening of Brenda [name] tremors. Both hands are affected and also Brenda [name] head and sometimes Brenda [name] voice. Brenda [name] 50 year old brother has severe tremors. Brenda [name] had 2 sisters, neither one had tremors but Brenda [name] has a strong family history on Brenda [name] father's side with several paternal uncles who had tremors.Brenda [name] is married and lives with Brenda [name] husband. Brenda [name] does not have any children.Brenda [name] has been on primidone for years. Currently 100 mg twice daily. Brenda [name] had limited results from it. Brenda [name] has been on metoprolol for years, currently 50 mg twice daily. Brenda [name] has never had surgical  consultation for DBS evaluation for essential tremor. Brenda [name] has never been on topiramate.Brenda [name] has found that alcohol helps and Brenda [name] usually drinks 4 ounces of wine 2-3 times a day. Brenda [name] quit smoking in 1990, and does not drink any caffeine. I reviewed your office note from 12/10/2014, which you kindly included. Blood work from 12/10/2014 include CMP which was unremarkable with the exception of mildly low sodium at 134, AST mildly  elevated at 46, cholesterol increased at 244, triglycerides 152, LDL 137.  Brenda [name] Past Medical History Is Significant For: Past Medical History  Diagnosis Date  . Migraines   . Hypertension   . DDD (degenerative disc disease), cervical   . Tremor   . Hypercholesteremia   . Osteoporosis   . Colon polyp   . Hiatal hernia   . Anxiety   . Shortness of breath dyspnea   . History of skin cancer   . Chronic diarrhea   . Weight loss     Brenda [name] Past Surgical History Is Significant For: Past Surgical History  Procedure Laterality Date  . Sinus surgery with instatrak    . Rhinoplasty    . Esophageal manometry N/A 01/15/2015    Procedure: ESOPHAGEAL MANOMETRY (EM);  Surgeon: Wonda Horner, MD;  Location: [facility];  Service: Endoscopy;  Laterality: N/A;  . Hiatal hernia repair      Brenda [name] Family History Is Significant For: Family History  Problem Relation Age of Onset  . Lung cancer Mother   . COPD Father   . Hypertension Brother     Brenda [name] Social History Is Significant For: Social History   Social History  . Marital Status: Married    Spouse Name: N/A  . Number of Children: 0  . Years of Education: MA   Occupational History  . Retired    Social History Main Topics  . Smoking status: Former Smoker    Quit date: 09/11/1988  . Smokeless tobacco: None  . Alcohol Use: 0.0 oz/week    0 Standard drinks or equivalent per week     Comment: OCCASIONAL  . Drug Use: No  . Sexual Activity: Not Asked   Other Topics Concern  . None   Social History Narrative   Denies any caffeine use     Brenda [name] Allergies Are:  Allergies  Allergen Reactions  . Macrodantin [Nitrofurantoin Macrocrystal] Nausea And Vomiting    Tongue swelling  . Codeine Nausea And Vomiting  . Topamax [Topiramate] Other (See Comments)    Loss of taste, fatigue  :   Brenda [name] Current Medications Are:  Outpatient Encounter Prescriptions as of 06/03/2015  Medication Sig  . atorvastatin (LIPITOR) 20 MG tablet Take 20 mg by  mouth every morning.   . Butalbital-APAP-Caffeine (ESGIC PO) Take 2 tablets by mouth as needed (headache).   . Calcium-Magnesium-Vitamin D [phone] MG-MG-UNIT TABS Take 1 tablet by mouth every morning.  . cholestyramine (QUESTRAN) 4 GM/DOSE powder   . hydrochlorothiazide (HYDRODIURIL) 25 MG tablet Take 25 mg by mouth every morning.   Marland Kitchen ibuprofen (ADVIL,MOTRIN) 200 MG tablet Take 200 mg by mouth every 6 (six) hours as needed for pain.  . Loperamide-Simethicone (IMODIUM MULTI-SYMPTOM RELIEF PO) Take 3 tablets by mouth every morning.   . metoprolol (LOPRESSOR) 50 MG tablet Take 50 mg by mouth 2 (two) times daily.  . Multiple Vitamins-Minerals (MULTIVITAMIN WITH MINERALS) tablet Take 1 tablet by mouth every morning.   . potassium chloride (K-DUR,KLOR-CON) 10 MEQ tablet Take 10 mEq by mouth every morning.   . primidone (  MYSOLINE) 50 MG tablet Take 100 mg by mouth 4 (four) times daily.   . verapamil (CALAN-SR) 240 MG CR tablet Take 240 mg by mouth every morning.  . [DISCONTINUED] verapamil (CALAN) 120 MG tablet Take 1 tablet (120 mg total) by mouth 2 (two) times daily.  . [DISCONTINUED] esomeprazole (NEXIUM) 40 MG capsule Take 40 mg by mouth every morning.   . [DISCONTINUED] HYDROcodone-acetaminophen (HYCET) 7.5-325 mg/15 ml solution Take 10 mLs by mouth 4 (four) times daily as needed for moderate pain.  . [DISCONTINUED] topiramate (TOPAMAX) 50 MG tablet 1 pill each night x 1 week, then 1 pill 2 times daily x 1 week, then 1 pill in AM and 2 at night x 1 week, then 2 pills 2 times daily. (Brenda [name] not taking: Reported on 03/13/2015)   No facility-administered encounter medications on file as of 06/03/2015.  :  Review of Systems:  Out of a complete 14 point review of systems, all are reviewed and negative with the exception of these symptoms as listed below:   Review of Systems  Gastrointestinal:       Hiatal hernia surgery completed since last visit.   Neurological:       Brenda [name] reports that  Topamax helped Brenda [name] tremors but Brenda [name] was unable to tolerate side effects.  Tremors are the same as last visit.     Objective:  Neurologic Exam  Physical Exam Physical Examination:   Filed Vitals:   06/03/15 1300  BP: 134/78  Pulse: 72  Resp: 16    General Examination: The Brenda [name] is a very pleasant 75 y.o. female in no acute distress. Brenda [name] appears well-developed and well-nourished and well groomed. Brenda [name] is tearful today.   HEENT: Normocephalic, atraumatic, pupils are equal, round and reactive to light and accommodation. Brenda [name] has a well healed scar on Brenda [name] lower lip, no facial bruise. Extraocular tracking is good without limitation to gaze excursion or nystagmus noted. Normal smooth pursuit is noted. Hearing is grossly intact. Face is symmetric with normal facial animation and normal facial sensation. Speech is clear with no dysarthria noted. There is no hypophonia. There is no lip, or jaw tremor. Brenda [name] has a slight voice tremor, and stable mild head tremor. Neck is supple with full range of passive and active motion. There are no carotid bruits on auscultation. Oropharynx exam reveals: moderate mouth dryness, adequate dental hygiene and mild airway crowding. Mallampati is class II. Tongue protrudes centrally and palate elevates symmetrically. Tonsils are small.    Chest: Clear to auscultation without wheezing, rhonchi or crackles noted.  Heart: S1+S2+0, regular and normal without murmurs, rubs or gallops noted.   Abdomen: Soft, non-tender and non-distended with normal bowel sounds appreciated on auscultation.  Extremities: There is no pitting edema in the distal lower extremities bilaterally. Pedal pulses are intact. Mild bruise L forearm.  Skin: Warm and dry without trophic changes noted. There are no varicose veins.  Musculoskeletal: exam reveals no obvious joint deformities, tenderness or joint swelling or erythema.   Neurologically:  Mental status: The Brenda [name] is awake, alert and  oriented in all 4 spheres. Brenda [name] immediate and remote memory, attention, language skills and fund of knowledge are appropriate. There is no evidence of aphasia, agnosia, apraxia or anomia. Speech is clear with normal prosody and enunciation. Thought process is linear. Mood is normal and affect is normal.  Cranial nerves II - XII are as described above under HEENT exam. In addition: shoulder shrug is normal with equal shoulder height noted. Motor exam: thin  bulk globally, normal tone is noted. There is no drift, or rebound. There is no resting tremor. There is a bilateral upper extremity postural and action tremor, which is moderate in degree, left more pronounced, stable. The tremor frequency is fairly fast and the amplitude is small to medium.   Romberg is negative, except slight sway. Reflexes are 2+ throughout. Fine motor skills and coordination: intact with normal finger taps, normal hand movements, normal rapid alternating patting, normal foot taps and normal foot agility, except for action tremor enhanced with UE movments.   Cerebellar testing: No dysmetria or intention tremor on finger to nose testing. Heel to shin is unremarkable bilaterally. There is no truncal or gait ataxia.  Sensory exam: intact to light touch in the upper and lower extremities.  Gait, station and balance: Brenda [name] stands easily. No veering to one side is noted. No leaning to one side is noted. Posture is age-appropriate and stance is narrow based. Gait shows normal stride length and normal pace. No problems turning are noted. Brenda [name] turns en bloc. Tandem walk is difficult for Brenda [name], but better than last time.   Assessment and Plan:   In summary, GEETA DWORKIN is a very pleasant 75 year old female with an underlying medical history of migraines, hypertension, degenerative disc disease of the cervical spine, sinus disease, status post sinus surgery, osteoporosis, (Prolia injections), allergies, former smoking, and hiatal hernia (s/p surgery  on 03/26/15), and chronic diarrhea, who presents for follow-up consultation of Brenda [name] long-standing history of essential tremor, for which Brenda [name] has been on Mysoline for years. Brenda [name] is currently taking 100 mg twice daily. Brenda [name] has been on metoprolol 50 mg twice daily. Brenda [name] had some symptomatic relief of Brenda [name] tremors with a trial of Topamax but had intolerable side effects including cognitive side effects, taste change, significant weight loss. Last time we talked about potentially trying long-acting topiramate such as Trokendi XR, but at this point Brenda [name] weight has not stabilized and in fact Brenda [name] has lost weight, so risking side effects such as weight loss is not recommended at this time. I think we can try to increase the Mysoline some. To that end, I suggested Brenda [name] increase it to 100 mg 3 times a day. I provided Brenda [name] with a new prescription for this. Brenda [name] will continue all Brenda [name] other medications. We talked about DBS surgery for essential tremor during today's appointment again. We will consider referral for DBS evaluation in the future. We may be able to try Trokendi XR in the near future once Brenda [name] weight has stabilized. Brenda [name] is reminded not to utilize alcohol as treatment for tremors.  I would like to see Brenda [name] back in 4 months, sooner if the need arises. I answered all their questions today and the Brenda [name] and Brenda [name] husband were in agreement.  I spent 25 minutes in total face-to-face time with the Brenda [name], more than 50% of which was spent in counseling and coordination of care, reviewing test results, reviewing medication and discussing or reviewing the diagnosis of ET, its prognosis and treatment options.

## 2015-06-03 NOTE — Patient Instructions (Addendum)
  Please remember, that any kind of tremor may be exacerbated by anxiety, anger, nervousness, excitement, dehydration, sleep deprivation, by caffeine, and low blood sugar values or blood sugar fluctuations. Some medications, especially some antidepressants and lithium can cause or exacerbate tremors. Tremors may temporarily calm down her subside with the use of a benzodiazepine such as Valium or related medications and with alcohol. Be aware however that drinking alcohol is not an approved treatment or appropriate treatment for tremor control and long-term use of benzodiazepines such as Valium, lorazepam, alprazolam, or clonazepam can cause habit formation, physical and psychological addiction.  You are already on 2 medications for tremor: Metoprolol (a beta-blocker) and Primidone (mysoline). You had side effects from the topamax and your weight has not yet stabilized enough and not high enough for us to risk side effects again. We may consider Trokendi XR in the future. For now, we will increase your primidone to 100 mg 3 times a day.   Do not utilize alcohol or wine as tremor treatment!  We will consider a consultation for DBS in the near future.

## 2015-06-18 ENCOUNTER — Other Ambulatory Visit: Payer: Self-pay | Admitting: General Surgery

## 2015-06-18 ENCOUNTER — Ambulatory Visit
Admission: RE | Admit: 2015-06-18 | Discharge: 2015-06-18 | Disposition: A | Discharge status: Home / Self Care | Payer: Medicare Other | Source: Ambulatory Visit | Attending: General Surgery | Admitting: General Surgery

## 2015-06-18 DIAGNOSIS — R1111 Vomiting without nausea: Secondary | ICD-10-CM

## 2015-07-18 ENCOUNTER — Emergency Department (HOSPITAL_COMMUNITY)
Admission: EM | Admit: 2015-07-18 | Discharge: 2015-07-18 | Disposition: A | Discharge status: Home / Self Care | Payer: Medicare Other | Attending: Emergency Medicine | Admitting: Emergency Medicine

## 2015-07-18 ENCOUNTER — Emergency Department (HOSPITAL_COMMUNITY): Payer: Medicare Other

## 2015-07-18 ENCOUNTER — Encounter (HOSPITAL_COMMUNITY): Payer: Self-pay | Admitting: Emergency Medicine

## 2015-07-18 DIAGNOSIS — Y929 Unspecified place or not applicable: Secondary | ICD-10-CM | POA: Insufficient documentation

## 2015-07-18 DIAGNOSIS — Y9301 Activity, walking, marching and hiking: Secondary | ICD-10-CM | POA: Diagnosis not present

## 2015-07-18 DIAGNOSIS — Y999 Unspecified external cause status: Secondary | ICD-10-CM | POA: Diagnosis not present

## 2015-07-18 DIAGNOSIS — Z79899 Other long term (current) drug therapy: Secondary | ICD-10-CM | POA: Diagnosis not present

## 2015-07-18 DIAGNOSIS — S81812A Laceration without foreign body, left lower leg, initial encounter: Secondary | ICD-10-CM | POA: Diagnosis not present

## 2015-07-18 DIAGNOSIS — I10 Essential (primary) hypertension: Secondary | ICD-10-CM | POA: Insufficient documentation

## 2015-07-18 DIAGNOSIS — S0990XA Unspecified injury of head, initial encounter: Secondary | ICD-10-CM | POA: Diagnosis present

## 2015-07-18 DIAGNOSIS — W108XXA Fall (on) (from) other stairs and steps, initial encounter: Secondary | ICD-10-CM | POA: Diagnosis not present

## 2015-07-18 DIAGNOSIS — F10229 Alcohol dependence with intoxication, unspecified: Secondary | ICD-10-CM | POA: Diagnosis not present

## 2015-07-18 DIAGNOSIS — IMO0002 Reserved for concepts with insufficient information to code with codable children: Secondary | ICD-10-CM

## 2015-07-18 DIAGNOSIS — S0101XA Laceration without foreign body of scalp, initial encounter: Secondary | ICD-10-CM | POA: Diagnosis not present

## 2015-07-18 DIAGNOSIS — Z87891 Personal history of nicotine dependence: Secondary | ICD-10-CM | POA: Diagnosis not present

## 2015-07-18 DIAGNOSIS — W19XXXA Unspecified fall, initial encounter: Secondary | ICD-10-CM

## 2015-07-18 MED ORDER — TETANUS-DIPHTH-ACELL PERTUSSIS 5-2.5-18.5 LF-MCG/0.5 IM SUSP: 0.5000 mL | Freq: Once | INTRAMUSCULAR | Status: DC

## 2015-07-18 MED ORDER — LIDOCAINE-EPINEPHRINE 1 %-1:100000 IJ SOLN
10.0000 mL | Freq: Once | INTRAMUSCULAR | Status: AC
  Administered 2015-07-18: 10 mL via INTRADERMAL
  Filled 2015-07-18: qty 1

## 2015-07-18 NOTE — ED Provider Notes (Signed)
CSN: [id number]     Arrival date & time 07/18/15  0023 History  By signing my name below, I, Emmanuella Mensah, attest that this documentation has been prepared under the direction and in the presence of Tomasita Crumble, MD. Electronically Signed: Angelene Giovanni, ED Scribe. 07/18/2015. 1:21 AM.    Chief Complaint  Patient presents with  . Fall  . Alcohol Intoxication   Patient is a 75 y.o. female presenting with fall and intoxication. The history is provided by the patient. No language interpreter was used.  Fall This is a new problem. The current episode started less than 1 hour ago. The problem has not changed since onset.Pertinent negatives include no chest pain and no shortness of breath. Nothing aggravates the symptoms. Nothing relieves the symptoms. She has tried nothing for the symptoms.  Alcohol Intoxication Pertinent negatives include no chest pain and no shortness of breath.   HPI Comments: Brenda [name] is a 75 y.o. female who presents to the Emergency Department with C-collar in place for evaluation of a hematoma to her posterior head and a laceration to her left lower leg below the knee s/p fall that occurred PTA. She reports associated left leg pain. Pt explains that she was drinking ETOH when she fell down 5 steps as she was walking up, hitting her posterior head on the floor. Pt is unsure of any LOC. She is also unsure of the exact mechanism of her fall. No alleviating factors noted. Pt has not tried any medications PTA. She denies any anticoagulants use. Pt has her tetanus vaccine last year. No fever or n/v.    Past Medical History  Diagnosis Date  . Migraines   . Hypertension   . DDD (degenerative disc disease), cervical   . Tremor   . Hypercholesteremia   . Osteoporosis   . Colon polyp   . Hiatal hernia   . Anxiety   . Shortness of breath dyspnea   . History of skin cancer   . Chronic diarrhea   . Weight loss    Past Surgical History  Procedure Laterality Date  .  Sinus surgery with instatrak    . Rhinoplasty    . Esophageal manometry N/A 01/15/2015    Procedure: ESOPHAGEAL MANOMETRY (EM);  Surgeon: Graylin Shiver, MD;  Location: [facility];  Service: Endoscopy;  Laterality: N/A;  . Hiatal hernia repair     Family History  Problem Relation Age of Onset  . Lung cancer Mother   . COPD Father   . Hypertension Brother    Social History  Substance Use Topics  . Smoking status: Former Smoker    Quit date: 09/11/1988  . Smokeless tobacco: None  . Alcohol Use: 0.0 oz/week    0 Standard drinks or equivalent per week     Comment: OCCASIONAL   OB History    No data available     Review of Systems  Constitutional: Negative for fever.  Respiratory: Negative for shortness of breath.   Cardiovascular: Negative for chest pain.  Gastrointestinal: Negative for nausea and vomiting.  Skin: Positive for wound.      Allergies  Macrodantin; Codeine; and Topamax  Home Medications   Prior to Admission medications   Medication Sig Start Date End Date Taking? Authorizing Provider  atorvastatin (LIPITOR) 20 MG tablet Take 20 mg by mouth every morning.     Historical Provider, MD  Butalbital-APAP-Caffeine (ESGIC PO) Take 2 tablets by mouth as needed (headache).     Historical Provider, MD  Calcium-Magnesium-Vitamin D 500-250-125 MG-MG-UNIT TABS Take 1 tablet by mouth every morning.    Historical Provider, MD  cholestyramine Lanetta Inch) 4 GM/DOSE powder  05/29/15   Historical Provider, MD  hydrochlorothiazide (HYDRODIURIL) 25 MG tablet Take 25 mg by mouth every morning.     Historical Provider, MD  ibuprofen (ADVIL,MOTRIN) 200 MG tablet Take 200 mg by mouth every 6 (six) hours as needed for pain.    Historical Provider, MD  Loperamide-Simethicone (IMODIUM MULTI-SYMPTOM RELIEF PO) Take 3 tablets by mouth every morning.     Historical Provider, MD  metoprolol (LOPRESSOR) 50 MG tablet Take 50 mg by mouth 2 (two) times daily.    Historical Provider, MD   Multiple Vitamins-Minerals (MULTIVITAMIN WITH MINERALS) tablet Take 1 tablet by mouth every morning.     Historical Provider, MD  potassium chloride (K-DUR,KLOR-CON) 10 MEQ tablet Take 10 mEq by mouth every morning.     Historical Provider, MD  primidone (MYSOLINE) 50 MG tablet Take 2 tablets (100 mg total) by mouth 3 (three) times daily. 06/03/15   Huston Foley, MD  verapamil (CALAN-SR) 240 MG CR tablet Take 240 mg by mouth every morning. 02/25/15   Historical Provider, MD   BP 130/78 mmHg  Pulse 77  Temp(Src) 98.5 F (36.9 C) (Oral)  Resp 17  Ht  (1.6 m)  Wt 109 lb (49.442 kg)  BMI 19.31 kg/m2  SpO2 98% Physical Exam  Constitutional: She is oriented to person, place, and time. She appears well-developed and well-nourished. No distress.  Clinically intoxicated  HENT:  Head: Normocephalic.  Nose: Nose normal.  Mouth/Throat: Oropharynx is clear and moist. No oropharyngeal exudate.  Soft tissue hematoma to the right parietal temporal skull   Eyes: Conjunctivae and EOM are normal. Pupils are equal, round, and reactive to light. No scleral icterus.  Neck: Normal range of motion. Neck supple. No JVD present. No tracheal deviation present. No thyromegaly present.  Cardiovascular: Normal rate, regular rhythm and normal heart sounds.  Exam reveals no gallop and no friction rub.   No murmur heard. Pulmonary/Chest: Effort normal and breath sounds normal. No respiratory distress. She has no wheezes. She exhibits no tenderness.  C-collar in place  Abdominal: Soft. Bowel sounds are normal. She exhibits no distension and no mass. There is no tenderness. There is no rebound and no guarding.  Musculoskeletal: Normal range of motion. She exhibits no edema or tenderness.  1 cm laceration to the left proximal anterior tibia, no active bleeding Moves all extremities and follows all commands  Lymphadenopathy:    She has no cervical adenopathy.  Neurological: She is alert and oriented to person,  place, and time. No cranial nerve deficit. She exhibits normal muscle tone.  Skin: Skin is warm and dry. No rash noted. No erythema. No pallor.  Nursing note and vitals reviewed.   ED Course  Procedures (including critical care time) DIAGNOSTIC STUDIES: Oxygen Saturation is 98% on RA, normal by my interpretation.    COORDINATION OF CARE: 12:43 AM- Pt advised of plan for treatment and pt agrees. Pt will receive T dap and laceration repair. She will also receive CT scans for further evaluation.   Imaging Review Ct Head Wo Contrast  07/18/2015  CLINICAL DATA:  Larey Seat down 5 wooden steps tonight EXAM: CT HEAD WITHOUT CONTRAST CT CERVICAL SPINE WITHOUT CONTRAST TECHNIQUE: Multidetector CT imaging of the head and cervical spine was performed following the standard protocol without intravenous contrast. Multiplanar CT image reconstructions of the cervical spine were also generated.  COMPARISON:  09/09/2012 FINDINGS: CT HEAD FINDINGS There is no intracranial hemorrhage, mass or evidence of acute infarction. There is moderate generalized atrophy. There is moderate chronic microvascular ischemic change. There is remote infarction in the left internal capsule genu, lentiform nuclei and centrum semiovale. There is no significant extra-axial fluid collection. No acute intracranial findings are evident. The calvarium and skullbase are intact. The visible paranasal sinuses and orbits are unremarkable. CT CERVICAL SPINE FINDINGS The vertebral column, pedicles and facet articulations are intact. There is no evidence of acute fracture. No acute soft tissue abnormalities are evident. Mild degenerative cervical disc disease is present from C5 through C7. IMPRESSION: 1. Negative for acute intracranial traumatic injury. There is generalized atrophy, chronic microvascular ischemic change and remote left-sided infarction. 2. Negative for acute cervical spine fracture. Electronically Signed   By: Ellery Plunkaniel R Mitchell M.D.   On:  07/18/2015 04:16   Ct Cervical Spine Wo Contrast  07/18/2015  CLINICAL DATA:  Larey SeatFell down 5 wooden steps tonight EXAM: CT HEAD WITHOUT CONTRAST CT CERVICAL SPINE WITHOUT CONTRAST TECHNIQUE: Multidetector CT imaging of the head and cervical spine was performed following the standard protocol without intravenous contrast. Multiplanar CT image reconstructions of the cervical spine were also generated. COMPARISON:  09/09/2012 FINDINGS: CT HEAD FINDINGS There is no intracranial hemorrhage, mass or evidence of acute infarction. There is moderate generalized atrophy. There is moderate chronic microvascular ischemic change. There is remote infarction in the left internal capsule genu, lentiform nuclei and centrum semiovale. There is no significant extra-axial fluid collection. No acute intracranial findings are evident. The calvarium and skullbase are intact. The visible paranasal sinuses and orbits are unremarkable. CT CERVICAL SPINE FINDINGS The vertebral column, pedicles and facet articulations are intact. There is no evidence of acute fracture. No acute soft tissue abnormalities are evident. Mild degenerative cervical disc disease is present from C5 through C7. IMPRESSION: 1. Negative for acute intracranial traumatic injury. There is generalized atrophy, chronic microvascular ischemic change and remote left-sided infarction. 2. Negative for acute cervical spine fracture. Electronically Signed   By: Ellery Plunkaniel R Mitchell M.D.   On: 07/18/2015 04:16    Tomasita CrumbleAdeleke Kimmy Parish, MD has personally reviewed and evaluated these images as part of his medical decision-making.  LACERATION REPAIR PROCEDURE NOTE The patient's identification was confirmed and consent was obtained. This procedure was performed by Tomasita CrumbleAdeleke Tad Fancher, MD at 1:19 AM. Site: left proximal anterior tibia Sterile procedures observed Anesthetic used (type and amt): 3 cc Lidocaine with epi 1% Suture type/size: 4.0 Prolene Length: 1 cm  # of Sutures:  4 Technique:simple interrupted Complexity Antibx ointment applied Tetanus UTD Site anesthetized, irrigated with NS, explored without evidence of foreign body, wound well approximated, site covered with dry, sterile dressing.  Patient tolerated procedure well without complications. Instructions for care discussed verbally and patient provided with additional written instructions for homecare and f/u.   LACERATION REPAIR PROCEDURE NOTE The patient's identification was confirmed and consent was obtained. This procedure was performed by Tomasita CrumbleAdeleke Evelene Roussin, MD at 1:19 AM. Site: Parietal temporal skull Sterile procedures observed Anesthetic used (type and amt): 4cc Lidocaine with epi 1% Suture type/size:4.0 Prolene Length: bleeding hematoma # of Sutures: 3 Technique: Simple interrupte Complexity Antibx ointment applied Tetanus UTD  Site anesthetized, irrigated with NS, explored without evidence of foreign body, wound well approximated, site covered with dry, sterile dressing.  Patient tolerated procedure well without complications. Instructions for care discussed verbally and patient provided with additional written instructions for homecare and f/u.   MDM  Final diagnoses:  None    Patient presents emergency department after a fall. Wound was repaired. CT scan was performed does not show any injuries. Collar was clinically cleared. Patient appears well in no acute distress, vital signs remain within her normal limits and she is safe for discharge.   I personally performed the services described in this documentation, which was scribed in my presence. The recorded information has been reviewed and is accurate.     Tomasita CrumbleAdeleke Ranika Mcniel, MD 07/18/15 [phone]

## 2015-07-18 NOTE — ED Notes (Signed)
Per [facility]   Pt fell down approx 5 wooden stairs. Pt was walking up the stairs and fell backwards. PT head hit the floor. Pt walked back up the stairs. No blood thinners. 2 glasses of wine. Pt states she slipped on her husband jacket. Pt did not want to come to the RD at first. Pt bled through 2 ABD pads. Does not recall falling. Pt negative for SCCA. Pt denies N/V/D.   A&O  156/84 96%RA 70HR CBG 130 16R

## 2015-07-18 NOTE — Discharge Instructions (Signed)
Alcohol Intoxication Ms. Farra, clean your wounds with normal soap and water twice per day.  See your primary care doctor within 3 days for wound check. You can take tylenol or ibuprofen as needed for pain. If symptoms worsen, come back to the ED immediately. Thank you. Alcohol intoxication occurs when you drink enough alcohol that it affects your ability to function. It can be mild or very severe. Drinking a lot of alcohol in a short time is called binge drinking. This can be very harmful. Drinking alcohol can also be more dangerous if you are taking medicines or other drugs. Some of the effects caused by alcohol may include:  Loss of coordination.  Changes in mood and behavior.  Unclear thinking.  Trouble talking (slurred speech).  Throwing up (vomiting).  Confusion.  Slowed breathing.  Twitching and shaking (seizures).  Loss of consciousness. HOME CARE  Do not drive after drinking alcohol.  Drink enough water and fluids to keep your pee (urine) clear or pale yellow. Avoid caffeine.  Only take medicine as told by your doctor. GET HELP IF:  You throw up (vomit) many times.  You do not feel better after a few days.  You frequently have alcohol intoxication. Your doctor can help decide if you should see a substance use treatment counselor. GET HELP RIGHT AWAY IF:  You become shaky when you stop drinking.  You have twitching and shaking.  You throw up blood. It may look bright red or like coffee grounds.  You notice blood in your poop (bowel movements).  You become lightheaded or pass out (faint). MAKE SURE YOU:   Understand these instructions.  Will watch your condition.  Will get help right away if you are not doing well or get worse.   This information is not intended to replace advice given to you by your health care provider. Make sure you discuss any questions you have with your health care provider.   Document Released: 06/23/2007 Document Revised:  09/06/2012 Document Reviewed: 06/09/2012 Elsevier Interactive Patient Education 2016 [employer] Laceration Care, Adult A laceration is a cut that goes through all layers of the skin. The cut also goes into the tissue that is right under the skin. Some cuts heal on their own. Others need to be closed with stitches (sutures), staples, skin adhesive strips, or wound glue. Taking care of your cut lowers your risk of infection and helps your cut to heal better. HOW TO TAKE CARE OF YOUR CUT For stitches or staples:  Keep the wound clean and dry.  If you were given a bandage (dressing), you should change it at least one time per day or as told by your doctor. You should also change it if it gets wet or dirty.  Keep the wound completely dry for the first 24 hours or as told by your doctor. After that time, you may take a shower or a bath. However, make sure that the wound is not soaked in water until after the stitches or staples have been removed.  Clean the wound one time each day or as told by your doctor:  Wash the wound with soap and water.  Rinse the wound with water until all of the soap comes off.  Pat the wound dry with a clean towel. Do not rub the wound.  After you clean the wound, put a thin layer of antibiotic ointment on it as told by your doctor. This ointment:  Helps to prevent infection.  Keeps the bandage from  sticking to the wound.  Have your stitches or staples removed as told by your doctor. If your doctor used skin adhesive strips:   Keep the wound clean and dry.  If you were given a bandage, you should change it at least one time per day or as told by your doctor. You should also change it if it gets dirty or wet.  Do not get the skin adhesive strips wet. You can take a shower or a bath, but be careful to keep the wound dry.  If the wound gets wet, pat it dry with a clean towel. Do not rub the wound.  Skin adhesive strips fall off on their own. You can trim  the strips as the wound heals. Do not remove any strips that are still stuck to the wound. They will fall off after a while. If your doctor used wound glue:  Try to keep your wound dry, but you may briefly wet it in the shower or bath. Do not soak the wound in water, such as by swimming.  After you take a shower or a bath, gently pat the wound dry with a clean towel. Do not rub the wound.  Do not do any activities that will make you really sweaty until the skin glue has fallen off on its own.  Do not apply liquid, cream, or ointment medicine to your wound while the skin glue is still on.  If you were given a bandage, you should change it at least one time per day or as told by your doctor. You should also change it if it gets dirty or wet.  If a bandage is placed over the wound, do not let the tape for the bandage touch the skin glue.  Do not pick at the glue. The skin glue usually stays on for 5-10 days. Then, it falls off of the skin. General Instructions  To help prevent scarring, make sure to cover your wound with sunscreen whenever you are outside after stitches are removed, after adhesive strips are removed, or when wound glue stays in place and the wound is healed. Make sure to wear a sunscreen of at least 30 SPF.  Take over-the-counter and prescription medicines only as told by your doctor.  If you were given antibiotic medicine or ointment, take or apply it as told by your doctor. Do not stop using the antibiotic even if your wound is getting better.  Do not scratch or pick at the wound.  Keep all follow-up visits as told by your doctor. This is important.  Check your wound every day for signs of infection. Watch for:  Redness, swelling, or pain.  Fluid, blood, or pus.  Raise (elevate) the injured area above the level of your heart while you are sitting or lying down, if possible. GET HELP IF:  You got a tetanus shot and you have any of these problems at the injection  site:  Swelling.  Very bad pain.  Redness.  Bleeding.  You have a fever.  A wound that was closed breaks open.  You notice a bad smell coming from your wound or your bandage.  You notice something coming out of the wound, such as wood or glass.  Medicine does not help your pain.  You have more redness, swelling, or pain at the site of your wound.  You have fluid, blood, or pus coming from your wound.  You notice a change in the color of your skin near your wound.  You need to change the bandage often because fluid, blood, or pus is coming from the wound.  You start to have a new rash.  You start to have numbness around the wound. GET HELP RIGHT AWAY IF:  You have very bad swelling around the wound.  Your pain suddenly gets worse and is very bad.  You notice painful lumps near the wound or on skin that is anywhere on your body.  You have a red streak going away from your wound.  The wound is on your hand or foot and you cannot move a finger or toe like you usually can.  The wound is on your hand or foot and you notice that your fingers or toes look pale or bluish.   This information is not intended to replace advice given to you by your health care provider. Make sure you discuss any questions you have with your health care provider.   Document Released: 06/23/2007 Document Revised: 05/21/2014 Document Reviewed: 12/31/2013 Elsevier Interactive Patient Education [employer]

## 2015-09-08 ENCOUNTER — Emergency Department (HOSPITAL_COMMUNITY)
Admission: EM | Admit: 2015-09-08 | Discharge: 2015-09-09 | Disposition: A | Discharge status: Home / Self Care | Payer: Medicare Other | Attending: Emergency Medicine | Admitting: Emergency Medicine

## 2015-09-08 ENCOUNTER — Encounter (HOSPITAL_COMMUNITY): Payer: Self-pay | Admitting: Nurse Practitioner

## 2015-09-08 ENCOUNTER — Emergency Department (HOSPITAL_COMMUNITY): Payer: Medicare Other

## 2015-09-08 DIAGNOSIS — Z79899 Other long term (current) drug therapy: Secondary | ICD-10-CM | POA: Insufficient documentation

## 2015-09-08 DIAGNOSIS — I10 Essential (primary) hypertension: Secondary | ICD-10-CM | POA: Insufficient documentation

## 2015-09-08 DIAGNOSIS — R112 Nausea with vomiting, unspecified: Secondary | ICD-10-CM

## 2015-09-08 DIAGNOSIS — R1084 Generalized abdominal pain: Secondary | ICD-10-CM | POA: Diagnosis present

## 2015-09-08 DIAGNOSIS — K2980 Duodenitis without bleeding: Secondary | ICD-10-CM | POA: Insufficient documentation

## 2015-09-08 DIAGNOSIS — Z87891 Personal history of nicotine dependence: Secondary | ICD-10-CM | POA: Insufficient documentation

## 2015-09-08 LAB — LIPASE, BLOOD: LIPASE: 25 U/L (ref 11–51)

## 2015-09-08 LAB — COMPREHENSIVE METABOLIC PANEL
ALBUMIN: 3.1 g/dL — AB (ref 3.5–5.0)
ALT: 26 U/L (ref 14–54)
ANION GAP: 7 (ref 5–15)
AST: 42 U/L — ABNORMAL HIGH (ref 15–41)
Alkaline Phosphatase: 100 U/L (ref 38–126)
BUN: <5 mg/dL — ABNORMAL LOW (ref 6–20)
CO2: 23 mmol/L (ref 22–32)
Calcium: 8.4 mg/dL — ABNORMAL LOW (ref 8.9–10.3)
Chloride: 103 mmol/L (ref 101–111)
Creatinine, Ser: 0.69 mg/dL (ref 0.44–1.00)
GFR calc Af Amer: >60 mL/min (ref >60)
GFR calc non Af Amer: >60 mL/min (ref >60)
GLUCOSE: 105 mg/dL — AB (ref 65–99)
POTASSIUM: 3.1 mmol/L — AB (ref 3.5–5.1)
SODIUM: 133 mmol/L — AB (ref 135–145)
TOTAL PROTEIN: 6.8 g/dL (ref 6.5–8.1)
Total Bilirubin: 0.6 mg/dL (ref 0.3–1.2)

## 2015-09-08 LAB — URINALYSIS, ROUTINE W REFLEX MICROSCOPIC
BILIRUBIN URINE: NEGATIVE
Glucose, UA: NEGATIVE mg/dL
Hgb urine dipstick: NEGATIVE
Ketones, ur: NEGATIVE mg/dL
NITRITE: NEGATIVE
PH: 7 (ref 5.0–8.0)
Protein, ur: NEGATIVE mg/dL
SPECIFIC GRAVITY, URINE: 1.016 (ref 1.005–1.030)

## 2015-09-08 LAB — URINE MICROSCOPIC-ADD ON: RBC / HPF: NONE SEEN RBC/hpf (ref 0–5)

## 2015-09-08 LAB — CBC
HEMATOCRIT: 38.1 % (ref 36.0–46.0)
HEMOGLOBIN: 12.9 g/dL (ref 12.0–15.0)
MCH: 32.8 pg (ref 26.0–34.0)
MCHC: 33.9 g/dL (ref 30.0–36.0)
MCV: 96.9 fL (ref 78.0–100.0)
Platelets: 210 10*3/uL (ref 150–400)
RBC: 3.93 MIL/uL (ref 3.87–5.11)
RDW: 11.6 % (ref 11.5–15.5)
WBC: 5.9 10*3/uL (ref 4.0–10.5)

## 2015-09-08 LAB — I-STAT CG4 LACTIC ACID, ED: LACTIC ACID, VENOUS: 1.23 mmol/L (ref 0.5–1.9)

## 2015-09-08 LAB — MAGNESIUM: Magnesium: 1.8 mg/dL (ref 1.7–2.4)

## 2015-09-08 MED ORDER — FENTANYL CITRATE (PF) 100 MCG/2ML IJ SOLN
50.0000 ug | Freq: Once | INTRAMUSCULAR | Status: AC
  Administered 2015-09-08: 50 ug via INTRAVENOUS
  Filled 2015-09-08: qty 2

## 2015-09-08 MED ORDER — FENTANYL CITRATE (PF) 100 MCG/2ML IJ SOLN
25.0000 ug | Freq: Once | INTRAMUSCULAR | Status: AC
  Administered 2015-09-08: 25 ug via INTRAVENOUS
  Filled 2015-09-08: qty 2

## 2015-09-08 MED ORDER — GI COCKTAIL ~~LOC~~
30.0000 mL | Freq: Once | ORAL | Status: AC
  Administered 2015-09-09: 30 mL via ORAL
  Filled 2015-09-08: qty 30

## 2015-09-08 MED ORDER — ONDANSETRON HCL 4 MG/2ML IJ SOLN
4.0000 mg | Freq: Once | INTRAMUSCULAR | Status: AC
  Administered 2015-09-09: 4 mg via INTRAVENOUS
  Filled 2015-09-08: qty 2

## 2015-09-08 MED ORDER — IOPAMIDOL (ISOVUE-300) INJECTION 61%
INTRAVENOUS | Status: AC
  Administered 2015-09-08: 100 mL
  Filled 2015-09-08: qty 100

## 2015-09-08 MED ORDER — FAMOTIDINE IN NACL 20-0.9 MG/50ML-% IV SOLN
20.0000 mg | Freq: Two times a day (BID) | INTRAVENOUS | Status: DC
  Administered 2015-09-09: 20 mg via INTRAVENOUS
  Filled 2015-09-08: qty 50

## 2015-09-08 MED ORDER — DIATRIZOATE MEGLUMINE & SODIUM 66-10 % PO SOLN
ORAL | Status: AC
  Filled 2015-09-08: qty 30

## 2015-09-08 MED ORDER — PANTOPRAZOLE SODIUM 40 MG PO TBEC
40.0000 mg | DELAYED_RELEASE_TABLET | Freq: Every day | ORAL | Status: DC
  Administered 2015-09-09: 40 mg via ORAL
  Filled 2015-09-08: qty 1

## 2015-09-08 NOTE — ED Provider Notes (Signed)
[facility]-EMERGENCY DEPT Provider Note   CSN: [id number] Arrival date & time: 09/08/15  1650     History   Chief Complaint Chief Complaint  Patient presents with  . Abdominal Pain    HPI Brenda [name] is a 75 y.o. female.  The history is provided by the patient and the spouse. No language interpreter was used.  Abdominal Pain   This is a recurrent problem. The current episode started more than 2 days ago. The problem occurs constantly. Progression since onset: waxes and wanes. The pain is associated with an unknown factor. The pain is located in the RUQ, epigastric region and LUQ. The quality of the pain is cramping and burning. The pain is severe. Associated symptoms include diarrhea, nausea and vomiting. Pertinent negatives include anorexia, fever, hematochezia, melena, constipation, dysuria, frequency and hematuria. The symptoms are aggravated by eating. Nothing relieves the symptoms. Past workup includes GI consult, CT scan and surgery. Her past medical history does not include PUD, gallstones, GERD, ulcerative colitis, Crohn's disease or irritable bowel syndrome.    Past Medical History:  Diagnosis Date  . Anxiety   . Chronic diarrhea   . Colon polyp   . DDD (degenerative disc disease), cervical   . Hiatal hernia   . History of skin cancer   . Hypercholesteremia   . Hypertension   . Migraines   . Osteoporosis   . Shortness of breath dyspnea   . Tremor   . Weight loss     Patient Active Problem List   Diagnosis Date Noted  . Paraesophageal hiatal hernia s/p repair w biologic mesh 03/26/2015 03/27/2015  . GERD (gastroesophageal reflux disease) s/p Nissen fundoplication 03/26/2015 03/26/2015    Past Surgical History:  Procedure Laterality Date  . ESOPHAGEAL MANOMETRY N/A 01/15/2015   Procedure: ESOPHAGEAL MANOMETRY (EM);  Surgeon: Graylin ShiverSalem F Ganem, MD;  Location: [facility];  Service: Endoscopy;  Laterality: N/A;  . HIATAL HERNIA REPAIR    . RHINOPLASTY    . SINUS SURGERY  WITH INSTATRAK      OB History    No data available       Home Medications    Prior to Admission medications   Medication Sig Start Date End Date Taking? Authorizing Provider  atorvastatin (LIPITOR) 20 MG tablet Take 20 mg by mouth every morning.    Yes Historical Provider, MD  Calcium-Magnesium-Vitamin D [id number] MG-MG-UNIT TABS Take 1 tablet by mouth every morning.   Yes Historical Provider, MD  hydrochlorothiazide (HYDRODIURIL) 25 MG tablet Take 25 mg by mouth every morning.    Yes Historical Provider, MD  Loperamide-Simethicone (IMODIUM MULTI-SYMPTOM RELIEF PO) Take 3 tablets by mouth every morning.    Yes Historical Provider, MD  metoprolol (LOPRESSOR) 50 MG tablet Take 50 mg by mouth 2 (two) times daily.   Yes Historical Provider, MD  Multiple Vitamins-Minerals (MULTIVITAMIN WITH MINERALS) tablet Take 1 tablet by mouth every morning.    Yes Historical Provider, MD  potassium chloride (K-DUR,KLOR-CON) 10 MEQ tablet Take 10 mEq by mouth every morning.    Yes Historical Provider, MD  primidone (MYSOLINE) 50 MG tablet Take 2 tablets (100 mg total) by mouth 3 (three) times daily. 06/03/15  Yes Huston FoleySaima Athar, MD  verapamil (CALAN-SR) 240 MG CR tablet Take 240 mg by mouth every morning. 02/25/15  Yes Historical Provider, MD  butalbital-acetaminophen-caffeine (FIORICET, ESGIC) 50-325-40 MG tablet Take 1 tablet by mouth every 4 (four) hours as needed. 07/09/15   Historical Provider, MD    Family History  Family History  Problem Relation Age of Onset  . Lung cancer Mother   . COPD Father   . Hypertension Brother     Social History Social History  Substance Use Topics  . Smoking status: Former Smoker    Quit date: 09/11/1988  . Smokeless tobacco: Never Used  . Alcohol use 0.0 oz/week     Comment: OCCASIONAL     Allergies   Macrodantin [nitrofurantoin macrocrystal]; Codeine; Diclofenac; and Topamax [topiramate]   Review of Systems Review of Systems  Constitutional: Negative for  chills, diaphoresis and fever.  HENT: Negative.   Eyes: Negative.   Respiratory: Negative for cough and shortness of breath.   Cardiovascular: Negative for chest pain, palpitations and leg swelling.  Gastrointestinal: Positive for abdominal pain, diarrhea, nausea and vomiting. Negative for anorexia, constipation, hematochezia and melena.  Genitourinary: Negative for dysuria, frequency and hematuria.  Musculoskeletal: Negative.   Skin: Negative.   Neurological: Negative for syncope and light-headedness.  Psychiatric/Behavioral: Negative.      Physical Exam Updated Vital Signs BP 183/98 (BP Location: Left Arm)   Pulse 84   Temp 98.3 F (36.8 C) (Oral)   Resp 16   SpO2 100%   Physical Exam  Constitutional: She is oriented to person, place, and time. She appears well-developed. She appears distressed (mild).  Thin  HENT:  Head: Normocephalic and atraumatic.  Mouth/Throat: Oropharynx is clear and moist.  Eyes: Conjunctivae are normal. No scleral icterus.  Neck: Normal range of motion. Neck supple. No tracheal deviation present.  Cardiovascular: Regular rhythm, normal heart sounds and intact distal pulses.   No murmur heard. Pulmonary/Chest: Effort normal and breath sounds normal. No stridor. No respiratory distress. She has no wheezes. She has no rales.  Abdominal: Soft. She exhibits no distension. There is tenderness (upper abdomen throughout). There is guarding (mild). There is no rebound.  No clear murphy's sign  Musculoskeletal: She exhibits no edema, tenderness or deformity.  Neurological: She is alert and oriented to person, place, and time. She is not disoriented. GCS eye subscore is 4. GCS verbal subscore is 5. GCS motor subscore is 6.  Skin: Skin is warm. Capillary refill takes less than 2 seconds. No rash noted. She is not diaphoretic.  Turgor intact  Psychiatric:  Pleasant and cooperative  Nursing note and vitals reviewed.    ED Treatments / Results  Labs (all  labs ordered are listed, but only abnormal results are displayed) Labs Reviewed  COMPREHENSIVE METABOLIC PANEL - Abnormal; Notable for the following:       Result Value   Sodium 133 (*)    Potassium 3.1 (*)    Glucose, Bld 105 (*)    BUN <5 (*)    Calcium 8.4 (*)    Albumin 3.1 (*)    AST 42 (*)    All other components within normal limits  URINALYSIS, ROUTINE W REFLEX MICROSCOPIC (NOT AT [facility]) - Abnormal; Notable for the following:    APPearance HAZY (*)    Leukocytes, UA MODERATE (*)    All other components within normal limits  URINE MICROSCOPIC-ADD ON - Abnormal; Notable for the following:    Squamous Epithelial / LPF 0-5 (*)    Bacteria, UA FEW (*)    Casts HYALINE CASTS (*)    All other components within normal limits  CBC  LIPASE, BLOOD  MAGNESIUM  I-STAT CG4 LACTIC ACID, ED    EKG  EKG Interpretation  Date/Time:  Monday September 08 2015 20:38:13 EDT Ventricular Rate:  79 PR Interval:    QRS Duration: 89 QT Interval:  405 QTC Calculation: 465 R Axis:   81 Text Interpretation:  Sinus rhythm Borderline right axis deviation Low voltage, extremity and precordial leads Minimal ST depression, diffuse leads No acute changes Confirmed by Rhunette CroftNANAVATI, MD, Janey GentaANKIT [phone]) on 09/08/2015 9:43:20 PM Also confirmed by Rhunette CroftNANAVATI, MD, Janey GentaANKIT [phone]), editor Whitney PostLOGAN, Cala BradfordKIMBERLY [phone])  on 09/09/2015 8:15:12 AM       Radiology CT abd/Pelvis w contrast 1. Right upper quadrant inflammation is likely centered on and duodenum, most concerning for duodenitis. There is wall thickening with adjacent fat stranding and small amount of fluid. While the fluid abuts the gallbladder, no signs of gallbladder inflammation. 2. Decreased size of hiatal hernia, now small in size. 3. Chronic incidental findings of hepatic steatosis and abdominal aortic atherosclerosis.  Procedures Procedures (including critical care time)  Medications Ordered in ED Medications  fentaNYL (SUBLIMAZE) injection 25 mcg (25  mcg Intravenous Given 09/08/15 2034)  iopamidol (ISOVUE-300) 61 % injection (100 mLs  Contrast Given 09/08/15 2257)  fentaNYL (SUBLIMAZE) injection 25 mcg (25 mcg Intravenous Given 09/08/15 2056)  fentaNYL (SUBLIMAZE) injection 50 mcg (50 mcg Intravenous Given 09/08/15 2220)  gi cocktail (Maalox,Lidocaine,Donnatal) (30 mLs Oral Given 09/09/15 0015)  ondansetron (ZOFRAN) injection 4 mg (4 mg Intravenous Given 09/09/15 0015)  famotidine 20 mg IV   Initial Impression / Assessment and Plan / ED Course  I have reviewed the triage vital signs and the nursing notes.  Pertinent labs & imaging results that were available during my care of the patient were reviewed by me and considered in my medical decision making (see chart for details).  Clinical Course   PT presents with worsening abd pain in the setting of recent abd surgery for a hiatal hernia repair. Pt is concerned that her hernia repair has failed. Patient has had pain for the past 3 months but it acutely worsened 3 days ago. Has also developed post prandial nausea with occasional bile. No hematemesis. Still has Bms, last one in the Ed. Tender abd with no clear signs of peritonitis. Low suspicion for perforated abd at this time. VSS. Does not appear ill. CMP, lipase sent and no concern for pancreatitis, hepatitis or cholecystitis. Sent trop and EKG since patient is elderly, both of which were grossly unremarkable for ACS. Sent UA to rule out UTI. Sample unremarkable as well. CBC w/o leukocytosis. Obtained CT abd with contrast, which showed findings concerning for duodenitis. radiographically, unlikely to have appendicitis or cholecystitis based on imaging results. Discussed findings with patient and husband. Encouraged restarting patient on PPI and carafate for symptom control. Zofran will be rx'ed for nausea control. Gave famotidine, GI cocktail, omeprazole, zofran in the ED to start treatment. Patient appeared more comfortable on reassessment. Discussed  need to follow up with GI doctor. All questions answered. Pt and VS stable at dc. Usual and customary return precautions given for abd pain, N/V.   Final Clinical Impressions(s) / ED Diagnoses   Final diagnoses:  Duodenitis  Non-intractable vomiting with nausea, vomiting of unspecified type    New Prescriptions Discharge Medication List as of 09/09/2015 12:56 AM    START taking these medications   Details  omeprazole (PRILOSEC) 20 MG capsule Take 1 capsule (20 mg total) by mouth daily., Starting Tue 09/09/2015, Print    ondansetron (ZOFRAN ODT) 4 MG disintegrating tablet Take 1 tablet (4 mg total) by mouth every 8 (eight) hours as needed for nausea or vomiting., Starting Tue 09/09/2015, Print  sucralfate (CARAFATE) 1 g tablet Take 1 tablet (1 g total) by mouth 4 (four) times daily as needed., Starting Tue 09/09/2015, Print         Maretta Bees, MD 09/11/15 [id number]    Derwood Kaplan, MD 09/15/15 2227

## 2015-09-08 NOTE — ED Triage Notes (Addendum)
She c/o 3 month history of intermittent generalized abd pain, which over past 3 days has become constant and severe. Today she began vomiting. She took a nausea pill with some relief of nausea and pain, but the pain has gotten worse again. She denies fevers, bowel/bladder changes. She had hernia repair in march. She is alert and breathing easily, crying due to pain.

## 2015-09-08 NOTE — Progress Notes (Signed)
Chaplain responded to a request by patient and family for a time of prayer.  Pt was emotional and described continuing pain and discomfort. Husband said he was glad they were addressing her continued pain and difficulties. Chaplain offered empathetic listening, hospitality and prayer. Chaplain is available if further support is desired.  [employer]able[employer] SystemsVirginia Carmen [name], [address]haplain

## 2015-09-08 NOTE — ED Notes (Signed)
Assisted pt to bathroom. Secured pt back in bed safetly with no incidents.

## 2015-09-09 MED ORDER — SUCRALFATE 1 G PO TABS: 1.0000 g | ORAL_TABLET | Freq: Four times a day (QID) | ORAL | 0 refills | Status: DC | PRN

## 2015-09-09 MED ORDER — ONDANSETRON 4 MG PO TBDP: 4.0000 mg | ORAL_TABLET | Freq: Three times a day (TID) | ORAL | 0 refills | Status: DC | PRN

## 2015-09-09 MED ORDER — OMEPRAZOLE 20 MG PO CPDR: 20.0000 mg | DELAYED_RELEASE_CAPSULE | Freq: Every day | ORAL | 0 refills | Status: DC

## 2015-09-09 NOTE — ED Notes (Signed)
Patient left at this time with all belongings. 

## 2015-10-07 ENCOUNTER — Ambulatory Visit (INDEPENDENT_AMBULATORY_CARE_PROVIDER_SITE_OTHER): Payer: Medicare Other | Admitting: Neurology

## 2015-10-07 ENCOUNTER — Encounter: Payer: Self-pay | Admitting: Neurology

## 2015-10-07 VITALS — BP 122/60 | HR 70 | Resp 14 | Ht 63.0 in | Wt 108.0 lb

## 2015-10-07 DIAGNOSIS — Z9181 History of falling: Secondary | ICD-10-CM

## 2015-10-07 DIAGNOSIS — K529 Noninfective gastroenteritis and colitis, unspecified: Secondary | ICD-10-CM | POA: Diagnosis not present

## 2015-10-07 DIAGNOSIS — G250 Essential tremor: Secondary | ICD-10-CM | POA: Diagnosis not present

## 2015-10-07 MED ORDER — PRIMIDONE 50 MG PO TABS: 100.0000 mg | ORAL_TABLET | Freq: Three times a day (TID) | ORAL | 5 refills | Status: DC

## 2015-10-07 NOTE — Progress Notes (Signed)
Subjective:    [name] ID: Brenda [name] is a 75 y.o. female.  HPI    Interim history:   Brenda [name] is a 75 year old left-handed woman with an underlying medical history of migraines, hypertension, degenerative disc disease of Brenda cervical spine, sinus disease, status post sinus surgery, osteoporosis, on probably a injections, allergies, former smoking, and hiatal hernia, who presents for follow-up consultation of Brenda [name] upper extremity tremors. Brenda [name] is accompanied by Brenda [name] husband today. I last saw Brenda [name] on 06/03/2015, at which time she reported doing about Brenda same. She was off of Topamax, this had caused side effects. She had robotic-assisted hiatal hernia surgery on 03/26/2015 with good success but was only able to advance Brenda [name] diet very slowly and had weight loss. This had stabilized recently. She had issues with chronic diarrhea. She was tearful because of ongoing issues with Brenda [name] tremors. She would take Brenda [name] second dose of Mysoline a little earlier when she had plans to go out. She did admit to drinking wine to tone down Brenda tremor. I suggested a cautious increase in primidone 200 mg 3 times a day and continuation of Brenda [name] metoprolol. We talked about potentially trying long-acting Trokendi in Brenda future we also talked about DBS consultation in Brenda future. She was reminded not to utilize alcohol as a treatment for tremors.   Today, 10/07/2015: She feels Brenda increase in primidone helped some. Drinks one glass of wine daily. [place] in June 2017, needed staples in Brenda back of Brenda head on Brenda R and sutures to L below knee. Weight slightly lower still. She has some trouble sleeping at night. Takes benadryl 25 mg 3 pills at night.   She reports doing better with respect to Brenda [name] GI set back recently. Of note, she was seen in Brenda emergency room on 07/18/2015 after a fall and laceration to Brenda [name] head, she was noted to have alcohol at Brenda time of Brenda [name] fall and she fell down some steps. I reviewed Brenda emergency room  records. She had a head CT and cervical spine CT without contrast on 07/18/2015:  IMPRESSION: 1. Negative for acute intracranial traumatic injury. There is generalized atrophy, chronic microvascular ischemic change and remote left-sided infarction. 2. Negative for acute cervical spine fracture.  Of note, she presented to Brenda emergency room on 09/08/2015 for abdominal pain, nausea, vomiting and diarrhea. She was diagnosed with duodenitis. She had a CT abdomen and pelvis with contrast on 09/08/2015: IMPRESSION: 1. Right upper quadrant inflammation is likely centered on and duodenum, most concerning for duodenitis. There is wall thickening with adjacent fat stranding and small amount of fluid. While Brenda fluid abuts Brenda gallbladder, no signs of gallbladder inflammation. 2. Decreased size of hiatal hernia, now small in size. 3. Chronic incidental findings of hepatic steatosis and abdominal aortic atherosclerosis.   Previously:  I saw Brenda [name] on 03/06/2015, at which time she reported that she had recently stopped Brenda Topamax because of side effects including change in Brenda [name] taste, dizzy spells, mental fogginess and weight loss. She did not feel that it helped or tremor. She reported a recent fall on linoleum floor in Brenda kitchen. Thankfully, she did not hit Brenda [name] head but braced Brenda [name] fall with Brenda [name] left forearm but somehow cut Brenda [name] lower lip. I suggested a 3 month checkup, we talked about potentially  trying long-acting topiramate.   I first met Brenda [name] on 12/26/2014 at Brenda request of Brenda [name] primary care physician, at which time she reported a long-standing history of upper extremity tremors  with symptoms dating back to Brenda [name] teenage years with gradual progression. Eventually, she developed a hand and voice tremor. She reported a family history of tremors as well. She was on metoprolol and primidone at Brenda time. We talked about potentially pursuing a consultation for DBS. I suggested a trial of Topamax with gradual  titration. I asked Brenda [name] to limit Brenda [name] alcohol consumption.   12/26/2014: She reports a long-standing history of tremors of Brenda [name] upper extremities. Symptoms started in Brenda [name] teens, but were very mild and intermittent for years. Gradually she became worse. In Brenda past 2 years she hasnoted significant worsening of Brenda [name] tremors. Both hands are affected and also Brenda [name] head and sometimes Brenda [name] voice. Brenda [name] 65 year old brother has severe tremors. She had 2 sisters, neither one had tremors but she has a strong family history on Brenda [name] father's side with several paternal uncles who had tremors.she is married and lives with Brenda [name] husband. She does not have any children.she has been on primidone for years. Currently 100 mg twice daily. She had limited results from it. She has been on metoprolol for years, currently 50 mg twice daily. She has never had surgical consultation for DBS evaluation for essential tremor. She has never been on topiramate.she has found that alcohol helps and she usually drinks 4 ounces of wine 2-3 times a day. She quit smoking in 1990, and does not drink any caffeine. I reviewed your office note from 12/10/2014, which you kindly included. Blood work from 12/10/2014 include CMP which was unremarkable with Brenda exception of mildly low sodium at 134, AST mildly elevated at 46, cholesterol increased at 244, triglycerides 152, LDL 137.   Brenda [name] Past Medical History Is Significant For: Past Medical History:  Diagnosis Date  . Anxiety   . Chronic diarrhea   . Colon polyp   . DDD (degenerative disc disease), cervical   . Hiatal hernia   . History of skin cancer   . Hypercholesteremia   . Hypertension   . Migraines   . Osteoporosis   . Shortness of breath dyspnea   . Tremor   . Weight loss     Brenda [name] Past Surgical History Is Significant For: Past Surgical History:  Procedure Laterality Date  . ESOPHAGEAL MANOMETRY N/A 01/15/2015   Procedure: ESOPHAGEAL MANOMETRY (EM);  Surgeon: Wonda Horner, MD;  Location:  [facility];  Service: Endoscopy;  Laterality: N/A;  . HIATAL HERNIA REPAIR    . RHINOPLASTY    . SINUS SURGERY WITH INSTATRAK      Brenda [name] Family History Is Significant For: Family History  Problem Relation Age of Onset  . Lung cancer Mother   . COPD Father   . Hypertension Brother     Brenda [name] Social History Is Significant For: Social History   Social History  . Marital status: Married    Spouse name: N/A  . Number of children: 0  . Years of education: MA   Occupational History  . Retired    Social History Main Topics  . Smoking status: Former Smoker    Quit date: 09/11/1988  . Smokeless tobacco: Never Used  . Alcohol use 0.0 oz/week     Comment: OCCASIONAL  . Drug use: No  . Sexual activity: Not Asked   Other Topics Concern  . None   Social History Narrative   Denies any caffeine use     Brenda [name] Allergies Are:  Allergies  Allergen Reactions  . Macrodantin [Nitrofurantoin Macrocrystal] Itching, Nausea And Vomiting and Rash    Tongue  swelling  . Codeine Nausea And Vomiting  . Diclofenac Other (See Comments)    Stomach burning  . Topamax [Topiramate] Other (See Comments)    Loss of taste, fatigue  . Tramadol Other (See Comments)    Stomach burning  :   Brenda [name] Current Medications Are:  Outpatient Encounter Prescriptions as of 10/07/2015  Medication Sig  . atorvastatin (LIPITOR) 20 MG tablet Take 20 mg by mouth every morning.   . butalbital-acetaminophen-caffeine (FIORICET, ESGIC) 50-325-40 MG tablet Take 1 tablet by mouth every 4 (four) hours as needed (tension headache).   . Calcium-Magnesium-Vitamin D [phone] MG-MG-UNIT TABS Take 1 tablet by mouth every morning.  . Eluxadoline (VIBERZI PO) Take by mouth.  . esomeprazole (NEXIUM) 20 MG capsule Take 20 mg by mouth daily at 12 noon.  . hydrochlorothiazide (HYDRODIURIL) 25 MG tablet Take 25 mg by mouth every morning.   . Loperamide-Simethicone (IMODIUM MULTI-SYMPTOM RELIEF PO) Take 2 tablets by mouth daily as needed  (for diarrhea).   . metoprolol (LOPRESSOR) 50 MG tablet Take 50 mg by mouth 2 (two) times daily.  . Multiple Vitamins-Minerals (MULTIVITAMIN WITH MINERALS) tablet Take 1 tablet by mouth every morning.   . ondansetron (ZOFRAN ODT) 4 MG disintegrating tablet Take 1 tablet (4 mg total) by mouth every 8 (eight) hours as needed for nausea or vomiting.  . potassium chloride (K-DUR,KLOR-CON) 10 MEQ tablet Take 10 mEq by mouth every morning.   . primidone (MYSOLINE) 50 MG tablet Take 2 tablets (100 mg total) by mouth 3 (three) times daily.  . promethazine (PHENERGAN) 25 MG tablet Take 12.5-25 mg by mouth every 8 (eight) hours as needed (stomach pain).   . sucralfate (CARAFATE) 1 g tablet Take 1 tablet (1 g total) by mouth 4 (four) times daily as needed.  . verapamil (CALAN-SR) 240 MG CR tablet Take 240 mg by mouth every morning.  . [DISCONTINUED] omeprazole (PRILOSEC) 20 MG capsule Take 1 capsule (20 mg total) by mouth daily.   No facility-administered encounter medications on file as of 10/07/2015.   :  Review of Systems:  Out of a complete 14 point review of systems, all are reviewed and negative with Brenda exception of these symptoms as listed below: Review of Systems  Neurological:       [name] would like to discuss DBS.  [name] states that Brenda increase in Primidone helps only after she first takes it. But any stressor causes tremors to come back.     Objective:  Neurologic Exam  Physical Exam Physical Examination:   Vitals:   10/07/15 1024  BP: 122/60  Pulse: 70  Resp: 14   General Examination: Brenda [name] is a very pleasant 75 y.o. female in no acute distress. She appears well-developed and well-nourished and well groomed. She is in good spirits today.  HEENT: Normocephalic, scab in Brenda R post head, healing scar. Pupils are equal, round and reactive to light and accommodation. She has a well healed scar on Brenda [name] lower lip, no facial bruise. Extraocular tracking is good without  limitation to gaze excursion or nystagmus noted. Normal smooth pursuit is noted. Hearing is grossly intact. Face is symmetric with normal facial animation and normal facial sensation. Speech is clear with no dysarthria noted. There is no hypophonia. There is no lip, or jaw tremor. She has a slight voice tremor, and stable mild head tremor. Neck is supple with full range of passive and active motion. There are no carotid bruits on auscultation. Oropharynx exam reveals: moderate mouth dryness,  adequate dental hygiene and mild airway crowding. Mallampati is class II. Tongue protrudes centrally and palate elevates symmetrically. Tonsils are small.    Chest: Clear to auscultation without wheezing, rhonchi or crackles noted.  Heart: S1+S2+0, regular and normal without murmurs, rubs or gallops noted.   Abdomen: Soft, non-tender and non-distended with normal bowel sounds appreciated on auscultation.  Extremities: There is no pitting edema in Brenda distal lower extremities bilaterally. Pedal pulses are intact. Mild bruise L forearm.  Skin: Warm and dry without trophic changes noted. There are no varicose veins.  Musculoskeletal: exam reveals no obvious joint deformities, tenderness or joint swelling or erythema, small, well healing scar below L knee cap.  Neurologically:  Mental status: Brenda [name] is awake, alert and oriented in all 4 spheres. Brenda [name] immediate and remote memory, attention, language skills and fund of knowledge are appropriate. There is no evidence of aphasia, agnosia, apraxia or anomia. Speech is clear with normal prosody and enunciation. Thought process is linear. Mood is normal and affect is normal.  Cranial nerves II - XII are as described above under HEENT exam. In addition: shoulder shrug is normal with equal shoulder height noted. Motor exam: thin bulk globally, normal tone is noted, reasonably good global strength. There is no drift, or rebound. There is no resting tremor. There is a  bilateral upper extremity postural and action tremor, which is moderate in degree, left more pronounced, stable. Brenda tremor frequency is fairly fast and Brenda amplitude is small to medium.   Romberg is negative, except slight sway, better today. Reflexes are 2+ throughout. Fine motor skills and coordination: intact with normal finger taps, normal hand movements, normal rapid alternating patting, normal foot taps and normal foot agility, except for action tremor enhanced with UE movments.   Cerebellar testing: No dysmetria or intention tremor on finger to nose testing. Heel to shin is unremarkable bilaterally. There is no truncal or gait ataxia.  Sensory exam: intact to light touch in Brenda upper and lower extremities.  Gait, station and balance: She stands easily. No veering to one side is noted. No leaning to one side is noted. Posture is age-appropriate and stance is narrow based. Gait shows normal stride length and normal pace. No problems turning are noted. She turns en bloc. Tandem walk is good today.   Assessment and Plan:   In summary, CLAUDIA GREENLEY is a very pleasant 75 year old female with an underlying medical history of migraines, hypertension, degenerative disc disease of Brenda cervical spine, sinus disease, status post sinus surgery, osteoporosis, (Prolia injections), allergies, former smoking, and hiatal hernia (s/p surgery on 03/26/15), and chronic diarrhea, s/p recent fall in 6/17 with head laceration, recent ER visit for duodenitis, who presents for follow-up consultation of Brenda [name] long-standing history of essential tremor, for which she has been on Mysoline for years. She is now on 100 mg tid with some success and improvement not. She has been on metoprolol 50 mg twice daily for years. She had some symptomatic relief of Brenda [name] tremors with a trial of Topamax but had intolerable side effects including cognitive side effects, taste change, significant weight loss. Last time we talked about potentially  trying long-acting topiramate such as Trokendi XR, but at this point Brenda [name] weight has not stabilized and in fact she has lost just a little be more weight, so risking side effects such as weight loss is not feasible. We will keep Brenda Mysoline at 100 mg 3 times a day. I provided Brenda [name] with a new  prescription for this. At this juncture, she is advised to abstain from alcohol altogether, in addition, she is advised to taper off Benadryl as she is taking quite a high dose and it is not recommended as a continual sleep aid, particularly in Brenda elderly. I suggested a trial of Mysoline for sleep, 1-3 mg, 1-2 hours before projected bedtime. She is advised to stay well-hydratedShtoday,. We talked about DBS referral. We picked up our discussion from last time and we mutually agreed to proceed with Brenda DBS evaluation referral. Brenda fat and, I placed a referral to Dr. Linus Mako at [facility].  She is reminded not to utilize alcohol as treatment for tremors.  I would like to see Brenda [name] back in 6 months, sooner if Brenda need arises. I answered all their questions today and Brenda [name] and Brenda [name] husband were in agreement.  I spent 25 minutes in total face-to-face time with Brenda [name], more than 50% of which was spent in counseling and coordination of care, reviewing test results, reviewing medication and discussing or reviewing Brenda diagnosis of ET, its prognosis and treatment options.

## 2015-10-07 NOTE — Patient Instructions (Addendum)
As discussed, I will make a referral to [facility] Rubin [facility]iddiqu[facility] for evaluation for deep brain stimulation (DBS) for treatment of advanced essential tremor.  You should hear back from the neurology department at [facility] directly. If you don't hear back in the next couple of weeks from them, call us and we will inquire about the status of the referral. Please keep in mind, that it may take several weeks or even a few months to get in to see the neurologist.  We will continue with the mysoline at 2 pills 3 times a day for now.   You can try Melatonin at night for sleep: take 1 mg to 3 mg, one to 2 hours before your bedtime. You can go up to 5 mg if needed. It is over the counter and comes in pill form, chewable form and spray, if you prefer.    Please reduce and stop the Benadryl.

## 2015-10-20 ENCOUNTER — Telehealth: Payer: Self-pay | Admitting: Neurology

## 2015-10-20 NOTE — Telephone Encounter (Signed)
Pt called in stating she has not heard from [facility] to make an appt. Please call and advise

## 2015-10-20 NOTE — Telephone Encounter (Signed)
Spoke to Patient as well and relayed details. Patient understood.

## 2015-10-20 NOTE — Telephone Encounter (Signed)
Called and left [facility]  a voice mail about patient's apt [phone].

## 2015-11-20 DIAGNOSIS — G250 Essential tremor: Secondary | ICD-10-CM | POA: Diagnosis present

## 2016-04-05 ENCOUNTER — Encounter (INDEPENDENT_AMBULATORY_CARE_PROVIDER_SITE_OTHER): Payer: Self-pay

## 2016-04-05 ENCOUNTER — Encounter: Payer: Self-pay | Admitting: Neurology

## 2016-04-05 ENCOUNTER — Ambulatory Visit (INDEPENDENT_AMBULATORY_CARE_PROVIDER_SITE_OTHER): Payer: Medicare Other | Admitting: Neurology

## 2016-04-05 VITALS — BP 124/68 | HR 80 | Resp 14 | Ht 63.0 in | Wt 111.0 lb

## 2016-04-05 DIAGNOSIS — G250 Essential tremor: Secondary | ICD-10-CM

## 2016-04-05 MED ORDER — PRIMIDONE 50 MG PO TABS: ORAL_TABLET | ORAL | 3 refills | Status: DC

## 2016-04-05 NOTE — Patient Instructions (Addendum)
I would recommend, that you complete your evaluation for DBS at [facility].  I would like to avoid using topamax again at this time for fear of side effects, especially weight loss, as you just now have stabilized your weight, which is still on the lower side.   As discussed, we can try a higher dose of primidone: 3 pills in AM, 2 pills midday, 3 pills at night. Side effects concerns include: Sleepiness, headaches, drowsiness, balance problems, confusion, and GI related symptoms.  I still want you to avoid drinking alcohol, especially in light of increasing the primidone.

## 2016-04-05 NOTE — Progress Notes (Signed)
Subjective:    Brenda [name] ID: Brenda [name] is a 76 y.o. female.  HPI     Interim history:   Brenda [name] is a 76 year old left-handed woman with an underlying medical history of migraines, hypertension, degenerative disc disease of the cervical spine, sinus disease, status post sinus surgery, osteoporosis, on probably a injections, allergies, former smoking, and hiatal hernia, who presents for follow-up consultation of Brenda [name] upper extremity tremors. The Brenda [name] is unaccompanied today. I last saw Brenda [name] on 10/07/2015, at which time Brenda [name] reported that the increase in primidone was somewhat helpful. Brenda [name] was drinking 1 glass of wine daily and was advised to abstain from drinking alcohol altogether. Brenda [name] had a fall in June 2017 which required staples in the back of Brenda [name] head and sutures to the left knee. Brenda [name] was also taking quite a bit of Benadryl at night. Brenda [name] was advised to try melatonin at night for sleep. Brenda [name] also had trouble maintaining Brenda [name] weight. Of note, Brenda [name] went to the ER on 07/18/2015 secondary to fall and laceration to head. Brenda [name] had a head CT and cervical spine CT without contrast on 07/18/2015:  IMPRESSION: 1. Negative for acute intracranial traumatic injury. There is generalized atrophy, chronic microvascular ischemic change and remote left-sided infarction. 2. Negative for acute cervical spine fracture.   Brenda [name] presented to the ER on 09/08/2015 for abdominal pain, nausea, vomiting and diarrhea. Brenda [name] was diagnosed with duodenitis. Brenda [name] had a CT abdomen and pelvis with contrast on 09/08/2015: IMPRESSION: 1. Right upper quadrant inflammation is likely centered on and duodenum, most concerning for duodenitis. There is wall thickening with adjacent fat stranding and small amount of fluid. While the fluid abuts the gallbladder, no signs of gallbladder inflammation. 2. Decreased size of hiatal hernia, now small in size. 3. Chronic incidental findings of hepatic steatosis and abdominal aortic  atherosclerosis.  We talked about DBS evaluation and I made a referral to Dr. Linus Mako.  Today, 04/05/2016: Brenda [name] reports that Brenda [name] has been taking primidone 3 pills bid, but would like to increase by 2 pills midday. Had reduced Brenda [name] EtOH for about 4 months, but is now drinking 2 glasses of white wine about 5 days a week. Brenda [name] has decided to not pursue DBS surgery even though Brenda [name] was deemed a reasonably good candidate for right-sided DBS, Brenda [name] is worried about side effects, Brenda [name] has one more evaluation for visit left as part of the evaluation, Brenda [name] is encouraged to complete the evaluation process. Brenda [name] has not fallen recently. Brenda [name] is wondering if Brenda [name] can try Topamax again at a smaller dose.  The Brenda [name]'s allergies, current medications, family history, past medical history, past social history, past surgical history and problem list were reviewed and updated as appropriate.   Previously (copied from previous notes for reference):   I saw Brenda [name] on 06/03/2015, at which time Brenda [name] reported doing about the same. Brenda [name] was off of Topamax, this had caused side effects. Brenda [name] had robotic-assisted hiatal hernia surgery on 03/26/2015 with good success but was only able to advance Brenda [name] diet very slowly and had weight loss. This had stabilized recently. Brenda [name] had issues with chronic diarrhea. Brenda [name] was tearful because of ongoing issues with Brenda [name] tremors. Brenda [name] would take Brenda [name] second dose of Mysoline a little earlier when Brenda [name] had plans to go out. Brenda [name] did admit to drinking wine to tone down the tremor. I suggested a cautious increase in primidone 200 mg 3 times a day and continuation of Brenda [name] metoprolol. We talked about potentially trying long-acting Trokendi in  the future we also talked about DBS consultation in the future. Brenda [name] was reminded not to utilize alcohol as a treatment for tremors.    I saw Brenda [name] on 03/06/2015, at which time Brenda [name] reported that Brenda [name] had recently stopped the Topamax because of side effects including change in Brenda [name] taste,  dizzy spells, mental fogginess and weight loss. Brenda [name] did not feel that it helped or tremor. Brenda [name] reported a recent fall on linoleum floor in the kitchen. Thankfully, Brenda [name] did not hit Brenda [name] head but braced Brenda [name] fall with Brenda [name] left forearm but somehow cut Brenda [name] lower lip. I suggested a 3 month checkup, we talked about potentially  trying long-acting topiramate.   I first met Brenda [name] on 12/26/2014 at the request of Brenda [name] primary care physician, at which time Brenda [name] reported a long-standing history of upper extremity tremors with symptoms dating back to Brenda [name] teenage years with gradual progression. Eventually, Brenda [name] developed a hand and voice tremor. Brenda [name] reported a family history of tremors as well. Brenda [name] was on metoprolol and primidone at the time. We talked about potentially pursuing a consultation for DBS. I suggested a trial of Topamax with gradual titration. I asked Brenda [name] to limit Brenda [name] alcohol consumption.   12/26/2014: Brenda [name] reports a long-standing history of tremors of Brenda [name] upper extremities. Symptoms started in Brenda [name] teens, but were very mild and intermittent for years. Gradually Brenda [name] became worse. In the past 2 years Brenda [name] hasnoted significant worsening of Brenda [name] tremors. Both hands are affected and also Brenda [name] head and sometimes Brenda [name] voice. Brenda [name] 54 year old brother has severe tremors. Brenda [name] had 2 sisters, neither one had tremors but Brenda [name] has a strong family history on Brenda [name] father's side with several paternal uncles who had tremors.Brenda [name] is married and lives with Brenda [name] husband. Brenda [name] does not have any children.Brenda [name] has been on primidone for years. Currently 100 mg twice daily. Brenda [name] had limited results from it. Brenda [name] has been on metoprolol for years, currently 50 mg twice daily. Brenda [name] has never had surgical consultation for DBS evaluation for essential tremor. Brenda [name] has never been on topiramate.Brenda [name] has found that alcohol helps and Brenda [name] usually drinks 4 ounces of wine 2-3 times a day. Brenda [name] quit smoking in 1990, and does not drink any caffeine. I reviewed your office  note from 12/10/2014, which you kindly included. Blood work from 12/10/2014 include CMP which was unremarkable with the exception of mildly low sodium at 134, AST mildly elevated at 46, cholesterol increased at 244, triglycerides 152, LDL 137.  Brenda [name] Past Medical History Is Significant For: Past Medical History:  Diagnosis Date  . Anxiety   . Chronic diarrhea   . Colon polyp   . DDD (degenerative disc disease), cervical   . Hiatal hernia   . History of skin cancer   . Hypercholesteremia   . Hypertension   . Migraines   . Osteoporosis   . Shortness of breath dyspnea   . Tremor   . Weight loss     Brenda [name] Past Surgical History Is Significant For: Past Surgical History:  Procedure Laterality Date  . ESOPHAGEAL MANOMETRY N/A 01/15/2015   Procedure: ESOPHAGEAL MANOMETRY (EM);  Surgeon: Wonda Horner, MD;  Location: [facility];  Service: Endoscopy;  Laterality: N/A;  . HIATAL HERNIA REPAIR    . RHINOPLASTY    . SINUS SURGERY WITH INSTATRAK      Brenda [name] Family History Is Significant For: Family History  Problem Relation Age of Onset  . Lung cancer Mother   . COPD Father   . Hypertension  Brother     Brenda [name] Social History Is Significant For: Social History   Social History  . Marital status: Married    Spouse name: N/A  . Number of children: 0  . Years of education: MA   Occupational History  . Retired    Social History Main Topics  . Smoking status: Former Smoker    Quit date: 09/11/1988  . Smokeless tobacco: Never Used  . Alcohol use 0.0 oz/week     Comment: OCCASIONAL  . Drug use: No  . Sexual activity: Not Asked   Other Topics Concern  . None   Social History Narrative   Denies any caffeine use     Brenda [name] Allergies Are:  Allergies  Allergen Reactions  . Macrodantin [Nitrofurantoin Macrocrystal] Itching, Nausea And Vomiting and Rash    Tongue swelling  . Codeine Nausea And Vomiting  . Diclofenac Other (See Comments)    Stomach burning  . Topamax [Topiramate] Other  (See Comments)    Loss of taste, fatigue  . Tramadol Other (See Comments)    Stomach burning  :   Brenda [name] Current Medications Are:  Outpatient Encounter Prescriptions as of 04/05/2016  Medication Sig  . atorvastatin (LIPITOR) 20 MG tablet Take 20 mg by mouth every morning.   . butalbital-acetaminophen-caffeine (FIORICET, ESGIC) 50-325-40 MG tablet Take 1 tablet by mouth every 4 (four) hours as needed (tension headache).   . Calcium-Magnesium-Vitamin D [phone] MG-MG-UNIT TABS Take 1 tablet by mouth every morning.  . Eluxadoline (VIBERZI PO) Take by mouth 2 (two) times daily.   . hydrochlorothiazide (HYDRODIURIL) 25 MG tablet Take 25 mg by mouth every morning.   . metoprolol (LOPRESSOR) 50 MG tablet Take 50 mg by mouth 2 (two) times daily.  . Multiple Vitamins-Minerals (MULTIVITAMIN WITH MINERALS) tablet Take 1 tablet by mouth every morning.   . ondansetron (ZOFRAN ODT) 4 MG disintegrating tablet Take 1 tablet (4 mg total) by mouth every 8 (eight) hours as needed for nausea or vomiting.  . potassium chloride (K-DUR,KLOR-CON) 10 MEQ tablet Take 10 mEq by mouth every morning.   . primidone (MYSOLINE) 50 MG tablet Take 2 tablets (100 mg total) by mouth 3 (three) times daily.  . promethazine (PHENERGAN) 25 MG tablet Take 12.5-25 mg by mouth every 8 (eight) hours as needed (stomach pain).   . sucralfate (CARAFATE) 1 g tablet Take 1 tablet (1 g total) by mouth 4 (four) times daily as needed.  . verapamil (CALAN-SR) 240 MG CR tablet Take 240 mg by mouth every morning.  Marland Kitchen VIBERZI 100 MG TABS Take 1 tablet by mouth 2 (two) times daily with a meal.  . [DISCONTINUED] esomeprazole (NEXIUM) 20 MG capsule Take 20 mg by mouth daily at 12 noon.  . [DISCONTINUED] Loperamide-Simethicone (IMODIUM MULTI-SYMPTOM RELIEF PO) Take 2 tablets by mouth daily as needed (for diarrhea).    No facility-administered encounter medications on file as of 04/05/2016.   : Review of Systems:  Out of a complete 14 point review of  systems, all are reviewed and negative with the exception of these symptoms as listed below: Review of Systems  Neurological:       Brenda [name] states that Brenda [name] did see Dr. Linus Mako to discuss DBS. Brenda [name] reports that Brenda [name] has decided not to pursue the surgery due to the risks. Although Brenda [name] has one more appt at [facility].    Brenda [name] would like to ask about taking Brenda [name] Primidone differently.     Objective:  Neurologic Exam  Physical Exam Physical Examination:  Vitals:   04/05/16 1147  BP: 124/68  Pulse: 80  Resp: 14   General Examination: The Brenda [name] is a very pleasant 76 y.o. female in no acute distress. Brenda [name] appears well-developed and well-nourished and well groomed.   HEENT: Normocephalic, atraumatic, pupils are equal, round and reactive to light and accommodation. Extraocular tracking is good without limitation to gaze excursion or nystagmus noted. Normal smooth pursuit is noted. Hearing is grossly intact. Face is symmetric with normal facial animation and normal facial sensation. Speech is clear with no dysarthria noted. There is no hypophonia. Brenda [name] has a mild voice tremor, Brenda [name] has some mild intermittent head tremor, both appear to be stable. Airway examination reveals mild to moderate mouth dryness, adequate dental hygiene, mild airway crowding, Mallampati class II. Tongue protrudes centrally and palate elevates symmetrically, tonsils are in the small side.   Chest: Clear to auscultation without wheezing, rhonchi or crackles noted.  Heart: S1+S2+0, regular and normal without murmurs, rubs or gallops noted.   Abdomen: Soft, non-tender and non-distended with normal bowel sounds appreciated on auscultation.  Extremities: There is no pitting edema in the distal lower extremities bilaterally.   Skin: Warm and dry without trophic changes noted.  Musculoskeletal: exam reveals no obvious joint deformities, tenderness or joint swelling or erythema.   Neurologically:  Mental status: The Brenda [name] is  awake, alert and oriented in all 4 spheres. Brenda [name] immediate and remote memory, attention, language skills and fund of knowledge are appropriate. There is no evidence of aphasia, agnosia, apraxia or anomia. Speech is clear with normal prosody and enunciation. Thought process is linear. Mood is normal and affect is normal.  Cranial nerves II - XII are as described above under HEENT exam. In addition: shoulder shrug is normal with equal shoulder height noted. Motor exam: Thin bulk, normal strength for age, normal tone. Brenda [name] has no significant resting tremor. Brenda [name] has a mild bilateral action and postural tremor, slightly better than last time, left-sided little worse than right. Reflexes are 1-2+ throughout, Romberg is negative. Brenda [name] has no dysmetria or intention tremor. Fine motor skills are age-appropriate.  Sensory exam: intact to light touch in the upper and lower extremities.  Gait, station and balance: Brenda [name] stands easily. No veering to one side is noted. No leaning to one side is noted. Posture is age-appropriate and stance is narrow based. Gait shows normal stride length and normal pace. No problems turning are noted.   Assessment and Plan:    In summary, ESRA FRANKOWSKI is a very pleasant 77 y.o.-year old female with an underlying medical history of migraine headaches, hypertension, degenerative disc disease of the neck, history of sinus disease with status post surgery, osteoporosis, allergies, prior smoking, hiatal hernia, history of chronic diarrhea, history of fall in June 2017 with head laceration, who presents for follow-up consultation of Brenda [name] long-standing history of essential tremor. Brenda [name] has been on Mysoline for years. Brenda [name] is currently taking 150 mg twice daily. Brenda [name] had side effects with Topamax including cognitive side effects, taste change in significant weight loss. Brenda [name] weight has just now been stabilized. I would avoid trying Topamax again and explained that to the Brenda [name]. Brenda [name] has in the interim had  evaluation for potential DBS surgery and was deemed a candidate for right DBS to control Brenda [name] left sided tremor which is Brenda [name] dominant side and the worse side as well as Versed tremors concerned. Nevertheless, Brenda [name] has currently decided not to proceed with surgery but is encouraged to complete the evaluation for this.  I talked to Brenda [name] last time about my concerns regarding Brenda [name] alcohol consumption. Brenda [name] was advised to refrain from drinking alcohol and reports that Brenda [name] reduced it for about 4 months but is currently drinking about 2 glasses of white wine most nights of the week. Brenda [name] is advised that we can certainly try to increase the Mysoline to 3 pills in the mornings, 2 pills midday, and 3 pills at night but Brenda [name] is advised to refrain from drinking alcohol as it could interfere with the Mysoline and Brenda [name] has taken a fall in the recent past in the context of alcohol intoxication.  Brenda [name] requested additional help for sleep issues at night. Brenda [name] was advised to not try any other medication for sleep at night for fear of side effects. Brenda [name] is advised that Mysoline can make Brenda [name] sleepy. Brenda [name] is discouraged from utilizing alcohol as a means to go to sleep. I adjusted Brenda [name] Mysoline prescription to allow for 2 pills midday in addition to Brenda [name] 3 pills twice daily currently. Brenda [name] is advised to watch for side effects including diarrhea, other GI related side effects including nausea, headaches, sleepiness, balance problems. I suggested a 6 month follow-up, sooner as needed. I answered all Brenda [name] questions today and Brenda [name] was in agreement. I spent 25 minutes in total face-to-face time with the Brenda [name], more than 50% of which was spent in counseling and coordination of care, reviewing test results, reviewing medication and discussing or reviewing the diagnosis of ET, its prognosis and treatment options. Pertinent laboratory and imaging test results that were available during this visit with the Brenda [name] were reviewed by me and considered in my  medical decision making (see chart for details).

## 2016-06-04 ENCOUNTER — Other Ambulatory Visit: Payer: Self-pay | Admitting: Neurology

## 2016-06-04 DIAGNOSIS — G250 Essential tremor: Secondary | ICD-10-CM

## 2016-10-07 ENCOUNTER — Encounter: Payer: Self-pay | Admitting: Neurology

## 2016-10-07 ENCOUNTER — Ambulatory Visit (INDEPENDENT_AMBULATORY_CARE_PROVIDER_SITE_OTHER): Payer: Medicare Other | Admitting: Neurology

## 2016-10-07 VITALS — BP 117/71 | HR 64 | Ht 63.5 in | Wt 122.0 lb

## 2016-10-07 DIAGNOSIS — G250 Essential tremor: Secondary | ICD-10-CM | POA: Diagnosis not present

## 2016-10-07 NOTE — Patient Instructions (Addendum)
We can continue with your primidone at 3 pills in the morning, 2 midday and 3 at night.  Your exam looks stable, better in fact, I am quite pleased.  Please continue to take care of yourself, you look well.  Please try to limit your alcohol intake to 1 glass per day.

## 2016-10-07 NOTE — Progress Notes (Signed)
Subjective:    Patient ID: Brenda [name] is a 76 y.o. female.  HPI     Interim history:  Brenda [name] is a 76 year old left-handed woman with an underlying medical history of migraines, hypertension, degenerative disc disease of the cervical spine, sinus disease, status post sinus surgery, osteoporosis, on probably a injections, allergies, former smoking, and hiatal hernia, who presents for follow-up consultation of her upper extremity tremors. The patient is accompanied by her husband today. I last saw her on 04/05/2016, at which time she reported taking 3 pills of primidone. We talked about her alcohol consumption. She decided not to pursue DBS surgery. She had gone for a consultation at [name]. She was wondering if she could try Topamax again.  Today, 10/07/2016 (all dictated new, as well as above notes, some dictation done in note pad or Word, outside of chart, may appear as copied):   She reports that she has been taking primidone 3 pills bid, And sometimes when she remembers she takes 2 pills midday. She does not consistently do this. She reports feeling better. She has adapted to having the tremor she believes. She is trying to stay [facility] She also tries to drink water and be better rested. She has gained weight and her appetite has been good. She still consumes about 2 glasses of white wine daily.    The patient's allergies, current medications, family history, past medical history, past social history, past surgical history and problem list were reviewed and updated as appropriate.    Previously (copied from previous notes for reference):   I saw her on 10/07/2015, at which time she reported that the increase in primidone was somewhat helpful. She was drinking 1 glass of wine daily and was advised to abstain from drinking alcohol altogether. She had a fall in June 2017 which required staples in the back of her head and sutures to the left knee. She was also  taking quite a bit of Benadryl at night. She was advised to try melatonin at night for sleep. She also had trouble maintaining her weight. Of note, she went to the ER on 07/18/2015 secondary to fall and laceration to head. She had a head CT and cervical spine CT without contrast on 07/18/2015:  IMPRESSION: 1. Negative for acute intracranial traumatic injury. There is generalized atrophy, chronic microvascular ischemic change and remote left-sided infarction. 2. Negative for acute cervical spine fracture.   She presented to the ER on 09/08/2015 for abdominal pain, nausea, vomiting and diarrhea. She was diagnosed with duodenitis. She had a CT abdomen and pelvis with contrast on 09/08/2015: IMPRESSION: 1. Right upper quadrant inflammation is likely centered on and duodenum, most concerning for duodenitis. There is wall thickening with adjacent fat stranding and small amount of fluid. While the fluid abuts the gallbladder, no signs of gallbladder inflammation. 2. Decreased size of hiatal hernia, now small in size. 3. Chronic incidental findings of hepatic steatosis and abdominal aortic atherosclerosis.   We talked about DBS evaluation and I made a referral to Dr. Linus Mako.     I saw her on 06/03/2015, at which time she reported doing about the same. She was off of Topamax, this had caused side effects. She had robotic-assisted hiatal hernia surgery on 03/26/2015 with good success but was only able to advance her diet very slowly and had weight loss. This had stabilized recently. She had issues with chronic diarrhea. She was tearful because of ongoing issues with her  tremors. She would take her second dose of Mysoline a little earlier when she had plans to go out. She did admit to drinking wine to tone down the tremor. I suggested a cautious increase in primidone 200 mg 3 times a day and continuation of her metoprolol. We talked about potentially trying long-acting Trokendi in the future we also talked  about DBS consultation in the future. She was reminded not to utilize alcohol as a treatment for tremors.    I saw her on 03/06/2015, at which time she reported that she had recently stopped the Topamax because of side effects including change in her taste, dizzy spells, mental fogginess and weight loss. She did not feel that it helped or tremor. She reported a recent fall on linoleum floor in the kitchen. Thankfully, she did not hit her head but braced her fall with her left forearm but somehow cut her lower lip. I suggested a 3 month checkup, we talked about potentially  trying long-acting topiramate.   I first met her on 12/26/2014 at the request of her primary care physician, at which time she reported a long-standing history of upper extremity tremors with symptoms dating back to her teenage years with gradual progression. Eventually, she developed a hand and voice tremor. She reported a family history of tremors as well. She was on metoprolol and primidone at the time. We talked about potentially pursuing a consultation for DBS. I suggested a trial of Topamax with gradual titration. I asked her to limit her alcohol consumption.   12/26/2014: She reports a long-standing history of tremors of her upper extremities. Symptoms started in her teens, but were very mild and intermittent for years. Gradually she became worse. In the past 2 years she hasnoted significant worsening of her tremors. Both hands are affected and also her head and sometimes her voice. Her 38 year old brother has severe tremors. She had 2 sisters, neither one had tremors but she has a strong family history on her father's side with several paternal uncles who had tremors.she is married and lives with her husband. She does not have any children.she has been on primidone for years. Currently 100 mg twice daily. She had limited results from it. She has been on metoprolol for years, currently 50 mg twice daily. She has never had surgical  consultation for DBS evaluation for essential tremor. She has never been on topiramate.she has found that alcohol helps and she usually drinks 4 ounces of wine 2-3 times a day. She quit smoking in 1990, and does not drink any caffeine. I reviewed your office note from 12/10/2014, which you kindly included. Blood work from 12/10/2014 include CMP which was unremarkable with the exception of mildly low sodium at 134, AST mildly elevated at 46, cholesterol increased at 244, triglycerides 152, LDL 137.  Her Past Medical History Is Significant For: Past Medical History:  Diagnosis Date  . Anxiety   . Chronic diarrhea   . Colon polyp   . DDD (degenerative disc disease), cervical   . Hiatal hernia   . History of skin cancer   . Hypercholesteremia   . Hypertension   . Migraines   . Osteoporosis   . Shortness of breath dyspnea   . Tremor   . Weight loss     Her Past Surgical History Is Significant For: Past Surgical History:  Procedure Laterality Date  . ESOPHAGEAL MANOMETRY N/A 01/15/2015   Procedure: ESOPHAGEAL MANOMETRY (EM);  Surgeon: Wonda Horner, MD;  Location: [facility];  Service: Endoscopy;  Laterality: N/A;  . HIATAL HERNIA REPAIR    . RHINOPLASTY    . SINUS SURGERY WITH INSTATRAK      Her Family History Is Significant For: Family History  Problem Relation Age of Onset  . Lung cancer Mother   . COPD Father   . Hypertension Brother     Her Social History Is Significant For: Social History   Social History  . Marital status: Married    Spouse name: N/A  . Number of children: 0  . Years of education: MA   Occupational History  . Retired    Social History Main Topics  . Smoking status: Former Smoker    Quit date: 09/11/1988  . Smokeless tobacco: Never Used  . Alcohol use 0.0 oz/week     Comment: OCCASIONAL  . Drug use: No  . Sexual activity: Not Asked   Other Topics Concern  . None   Social History Narrative   Denies any caffeine use     Her Allergies  Are:  Allergies  Allergen Reactions  . Macrodantin [Nitrofurantoin Macrocrystal] Itching, Nausea And Vomiting and Rash    Tongue swelling  . Codeine Nausea And Vomiting  . Diclofenac Other (See Comments)    Stomach burning  . Topamax [Topiramate] Other (See Comments)    Loss of taste, fatigue  . Tramadol Other (See Comments)    Stomach burning  :   Her Current Medications Are:  Outpatient Encounter Prescriptions as of 10/07/2016  Medication Sig  . atorvastatin (LIPITOR) 20 MG tablet Take 20 mg by mouth every morning.   . butalbital-acetaminophen-caffeine (FIORICET, ESGIC) 50-325-40 MG tablet Take 1 tablet by mouth every 4 (four) hours as needed (tension headache).   . Calcium-Magnesium-Vitamin D [phone] MG-MG-UNIT TABS Take 1 tablet by mouth every morning.  . hydrochlorothiazide (HYDRODIURIL) 25 MG tablet Take 25 mg by mouth every morning.   . metoprolol (LOPRESSOR) 50 MG tablet Take 50 mg by mouth 2 (two) times daily.  . Multiple Vitamins-Minerals (MULTIVITAMIN WITH MINERALS) tablet Take 1 tablet by mouth every morning.   . potassium chloride (K-DUR,KLOR-CON) 10 MEQ tablet Take 10 mEq by mouth every morning.   . primidone (MYSOLINE) 50 MG tablet Take 3 pills in AM, 2 midday, and 3 pills at night.  . promethazine (PHENERGAN) 25 MG tablet Take 12.5-25 mg by mouth every 8 (eight) hours as needed (stomach pain).   . sucralfate (CARAFATE) 1 g tablet Take 1 tablet (1 g total) by mouth 4 (four) times daily as needed.  . verapamil (CALAN-SR) 240 MG CR tablet Take 240 mg by mouth every morning.  Marland Kitchen VIBERZI 100 MG TABS Take 1 tablet by mouth 2 (two) times daily with a meal.  . [DISCONTINUED] Eluxadoline (VIBERZI PO) Take by mouth 2 (two) times daily.   . [DISCONTINUED] ondansetron (ZOFRAN ODT) 4 MG disintegrating tablet Take 1 tablet (4 mg total) by mouth every 8 (eight) hours as needed for nausea or vomiting.   No facility-administered encounter medications on file as of 10/07/2016.    :  Review of Systems:  Out of a complete 14 point review of systems, all are reviewed and negative with the exception of these symptoms as listed below: Review of Systems  Neurological:       Pt presents today to discuss her tremor. Pt reports that her tremor does not bother her. Pt is taking primidone.    Objective:  Neurological Exam  Physical Exam Physical Examination:   Vitals:  10/07/16 1141  BP: 117/71  Pulse: 64   General Examination: The patient is a very pleasant 76 y.o. female in no acute distress. She appears well-developed and well-nourished and well groomed. Good spirits, weight better.   HEENT: Normocephalic, atraumatic, pupils are equal, round and reactive to light and accommodation. Extraocular tracking is good without limitation to gaze excursion or nystagmus noted. Normal smooth pursuit is noted. Hearing is grossly intact. Face is symmetric with normal facial animation and normal facial sensation. Speech is clear with no dysarthria noted. There is no hypophonia. She has a mild voice tremor, she has some mild intermittent head tremor, slightly better. Airway examination reveals mild to moderate mouth dryness, adequate dental hygiene, mild airway crowding, Mallampati class II. Tongue protrudes centrally and palate elevates symmetrically, tonsils small.   Chest: Clear to auscultation without wheezing, rhonchi or crackles noted.  Heart: S1+S2+0, regular and normal without murmurs, rubs or gallops noted.   Abdomen: Soft, non-tender and non-distended with normal bowel sounds appreciated on auscultation.  Extremities: There is no pitting edema in the distal lower extremities bilaterally.   Skin: Warm and dry without trophic changes noted.  Musculoskeletal: exam reveals no obvious joint deformities, tenderness or joint swelling or erythema.   Neurologically:  Mental status: The patient is awake, alert and oriented in all 4 spheres. Her immediate and remote  memory, attention, language skills and fund of knowledge are appropriate. There is no evidence of aphasia, agnosia, apraxia or anomia. Speech is clear with normal prosody and enunciation. Thought process is linear. Mood is normal and affect is normal.  Cranial nerves II - XII are as described above under HEENT exam. In addition: shoulder shrug is normal with equal shoulder height noted. Motor exam: Thin bulk, normal strength for age, normal tone. She has no significant resting tremor. She has a very slight bilateral action and postural tremor, improved from last time. Reflexes are 1-2+ throughout, Romberg is negative. She has no dysmetria or intention tremor. Fine motor skills are age-appropriate.  Sensory exam: intact to light touch in the upper and lower extremities.  Gait, station and balance: She stands easily. No veering to one side is noted. No leaning to one side is noted. Posture is age-appropriate and stance is narrow based. Gait shows normal stride length and normal pace. No problems turning are noted.   Assessment and Plan:    In summary, MARTESHA NIEDERMEIER is a very pleasant 76 year old female with an underlying medical history of migraine headaches, hypertension, degenerative disc disease of the neck, history of sinus disease with status post surgery, osteoporosis, allergies, prior smoking, hiatal hernia, history of chronic diarrhea, with significant weight loss last year, history of fall in June 2017 with head laceration, who presents for follow-up consultation of her long-standing history of essential tremor. She has been on Mysoline for years. She is currently taking 150 mg twice daily consistently and additional 100 mg during midday when she remembers. Of note, she has had side effects with Topamax in the past including cognitive changes, taste changes and significant weight loss. Her weight has stabilized and her appetite is good at this time but I would not recommend changing her medicine at  this time. She is actually improved in her exam and feels well at this time. She had evaluation for DBS surgery in the recent past and was deemed a reasonably good surgical candidate but decided not to pursue surgical treatment. I suggested she continue with her current treatment regimen. She is  encouraged to stay active physically and be well hydrated with water. She is again cautioned about her regular/daily alcohol intake and advised to limit her alcohol intake to one glass per day rather than 2 or perhaps more. Of note, she had taken a fall last year, in the context of alcohol intoxication.  I suggested a 6 month follow-up, sooner as needed. I answered all their questions today and the patient and her husband were in agreement. I spent 25 minutes in total face-to-face time with the patient, more than 50% of which was spent in counseling and coordination of care, reviewing test results, reviewing medication and discussing or reviewing the diagnosis of ET, its prognosis and treatment options. Pertinent laboratory and imaging test results that were available during this visit with the patient were reviewed by me and considered in my medical decision making (see chart for details).

## 2016-11-08 IMAGING — CT CT CERVICAL SPINE W/O CM
5 of 8 series · 12 of 33 positions shown, 13 images · non-contrast
Comparison: 09/09/2012

CLINICAL DATA: Fell down 5 [place] steps tonight

EXAM:
CT HEAD WITHOUT CONTRAST
CT CERVICAL SPINE WITHOUT CONTRAST
TECHNIQUE: Multidetector CT imaging of the head and cervical spine was
performed following the standard protocol without intravenous
contrast. Multiplanar CT image reconstructions of the cervical spine
were also generated.

[Series 3: c_spine 2.0 st · axial · 0.28mm/px · z∈[+1086,+1142]mm · 2 of 86 slices shown, 3 images]
[im 29/86  soft-tissue]
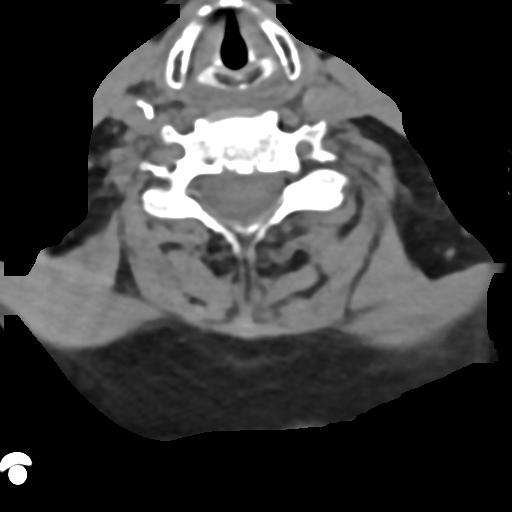
[im 29/86  bone]
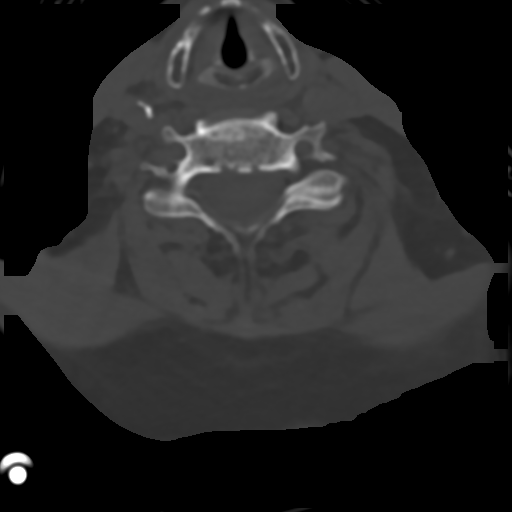
[im 57/86  bone]
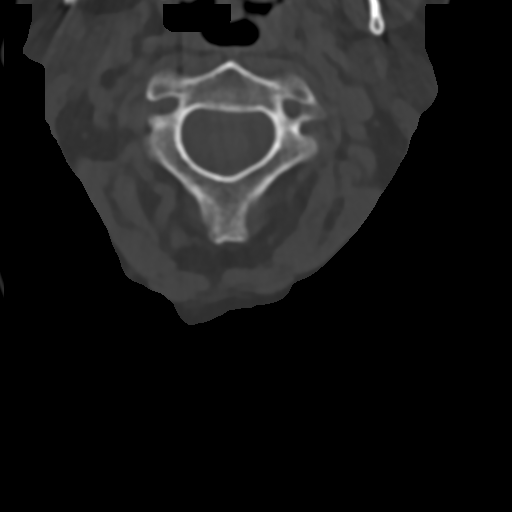

[Series 5: head bone · axial · 0.39mm/px · z∈[+1233,+1285]mm · 2 of 79 slices shown]
[im 27/79  bone]
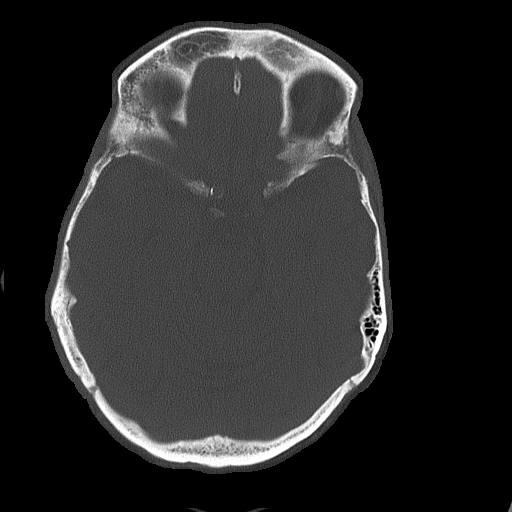
[im 53/79  bone]
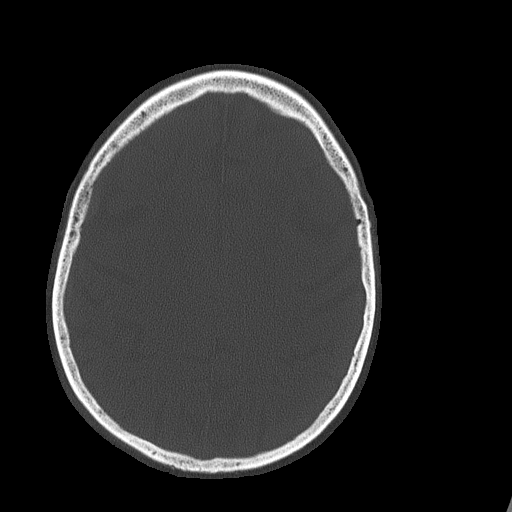

[Series 9: c_spine 2.0 sag bone · sagittal · 0.25mm/px · 5 of 88 slices shown]
[im 13/88  bone]
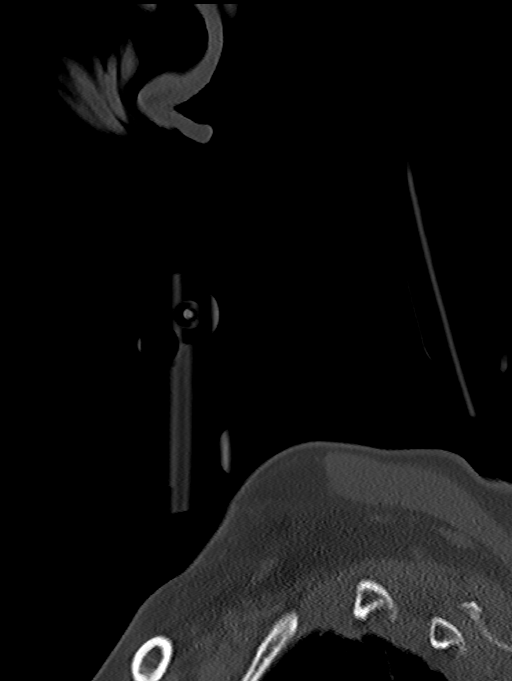
[im 25/88  bone]
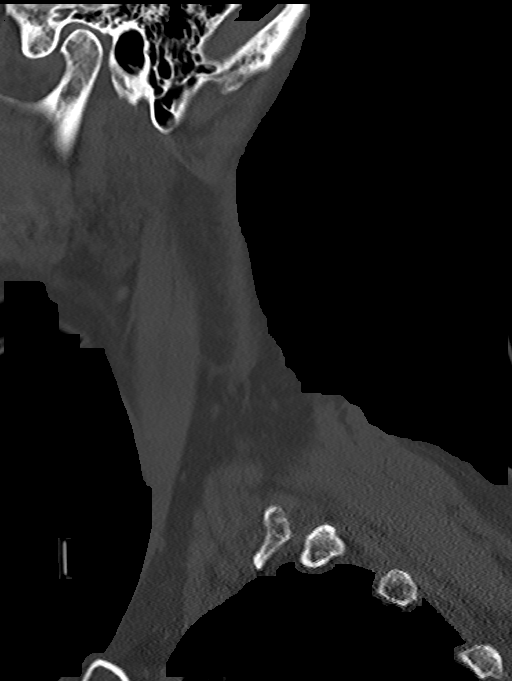
[im 38/88  bone]
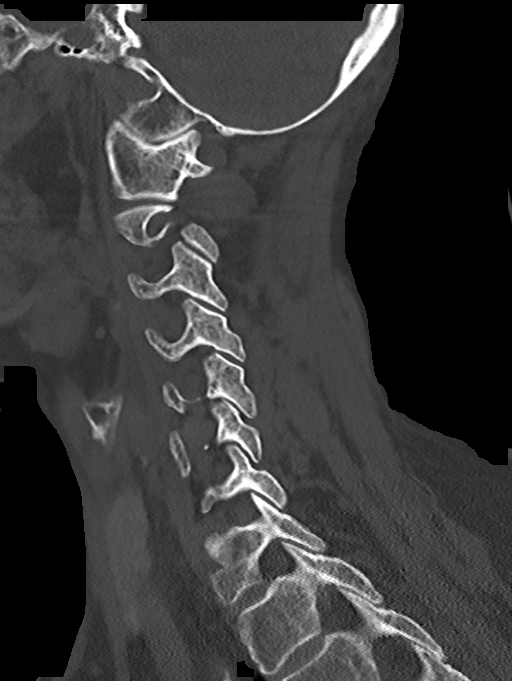
[im 50/88  bone]
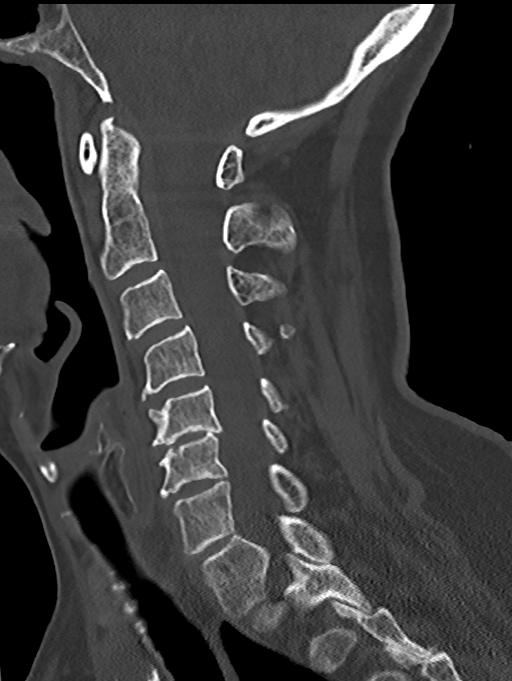
[im 63/88  bone]
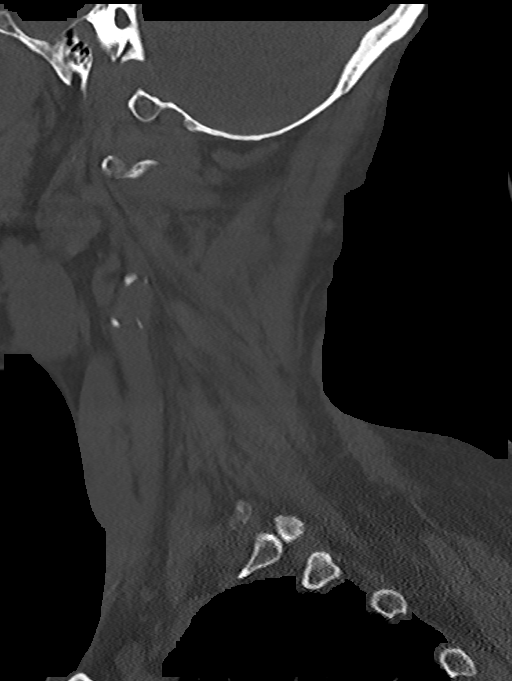

[Series 10: c_spine 2.0 cor bone · coronal · 0.25mm/px · 1 of 61 slices shown]
[im 31/61  bone]
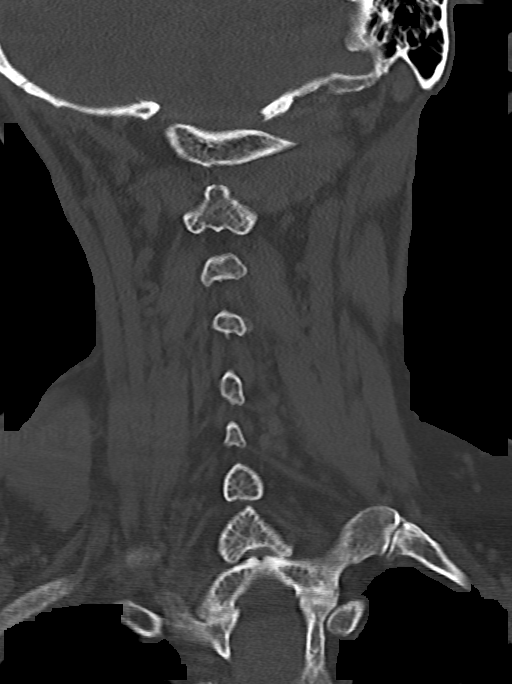

[Series 11: c_spine 2.0 orthogonals · axial · 0.21mm/px · z∈[+1065,+1114]mm · 2 of 83 slices shown]
[im 28/83  bone]
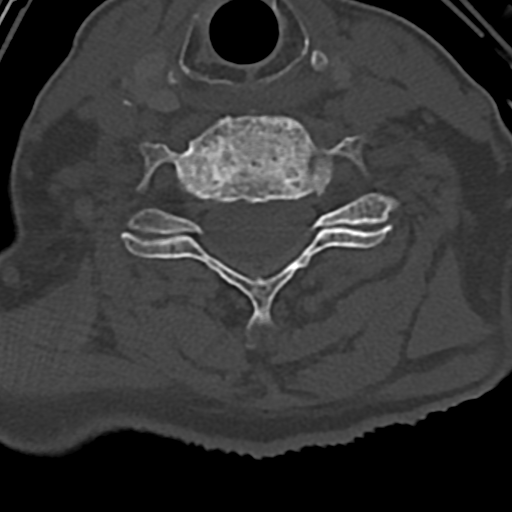
[im 55/83  bone]
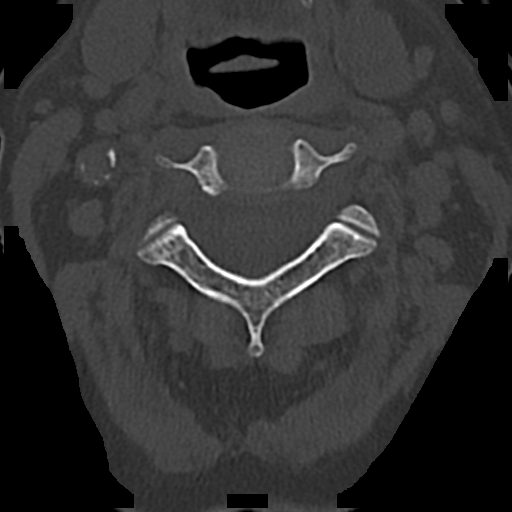

[12 of 33 positions shown; findings below may reference images not displayed]

FINDINGS: CT HEAD FINDINGS

There is no intracranial hemorrhage, mass or evidence of acute
infarction. There is moderate generalized atrophy. There is moderate
chronic microvascular ischemic change. There is remote infarction in
the left internal capsule genu, lentiform nuclei and centrum
semiovale. There is no significant extra-axial fluid collection.

No acute intracranial findings are evident. The calvarium and
skullbase are intact. The visible paranasal sinuses and orbits are
unremarkable.

CT CERVICAL SPINE FINDINGS

The vertebral column, pedicles and facet articulations are intact.
There is no evidence of acute fracture. No acute soft tissue
abnormalities are evident.

Mild degenerative cervical disc disease is present from C5 through
C7.
IMPRESSION: 1. Negative for acute intracranial traumatic injury. There is
generalized atrophy, chronic microvascular ischemic change and
remote left-sided infarction.
2. Negative for acute cervical spine fracture.

## 2017-01-28 ENCOUNTER — Other Ambulatory Visit: Payer: Self-pay | Admitting: Gastroenterology

## 2017-01-28 DIAGNOSIS — R1310 Dysphagia, unspecified: Secondary | ICD-10-CM

## 2017-02-01 ENCOUNTER — Ambulatory Visit
Admission: RE | Admit: 2017-02-01 | Discharge: 2017-02-01 | Disposition: A | Discharge status: Home / Self Care | Payer: Medicare Other | Source: Ambulatory Visit | Attending: Gastroenterology | Admitting: Gastroenterology

## 2017-02-01 DIAGNOSIS — R1310 Dysphagia, unspecified: Secondary | ICD-10-CM

## 2017-04-06 ENCOUNTER — Encounter: Payer: Self-pay | Admitting: Neurology

## 2017-04-06 ENCOUNTER — Ambulatory Visit: Payer: Medicare Other | Admitting: Neurology

## 2017-04-06 DIAGNOSIS — G250 Essential tremor: Secondary | ICD-10-CM | POA: Diagnosis not present

## 2017-04-06 MED ORDER — PRIMIDONE 50 MG PO TABS: ORAL_TABLET | ORAL | 3 refills | Status: DC

## 2017-04-06 NOTE — Patient Instructions (Addendum)
You can continue with the mysoline, so long as you work on eliminating your alcohol intake at night.  Your mysoline does not mix well with alcohol of any amount. Never use alcohol to relax or help you sleep!  In addition, you have started 2 new medications, which also cannot be considered safe with alcohol.  I will renew your mysoline at the current dose.  We will see you in another 6 months, you can see one of our nurse practitioners.

## 2017-04-06 NOTE — Progress Notes (Signed)
-Subjective:    Patient ID: Brenda [name] is a 77 y.o. female.  HPI     Interim history:   [name] is a 77 year old left-handed woman with an underlying medical history of migraines, hypertension, degenerative disc disease of the cervical spine, sinus disease, status post sinus surgery, osteoporosis, on probably a injections, allergies, former smoking, and hiatal hernia, who presents for follow-up consultation of her upper extremity tremors. The patient is unaccompanied today. I last saw her on 10/07/2013, at which time she was taking primidone 3 pills twice a day, sometimes 2 pills during the midday but not consistently. She had adopted to having the tremor. She had decided not to pursue DBS surgery. She was maintaining her weight and appetite. She was still consuming wine on a daily basis. I again explained my concern regarding consuming alcohol on a daily basis and taking Mysoline. She was instructed to continue with her Mysoline but strongly advised to limit her alcohol intake to one glass per day rather than 2.  Today, 04/06/2017 (all dictated new, as well as above notes, some dictation done in note pad or Word, outside of chart, may appear as copied):    She reports still taking mysoline 50 mg with success. She takes 3 pills in the morning and 3 at night and most days of the week she remembers to take 2 pills during the midday. She has had more anxiety which at times seems overwhelming to her. Her primary care physician prescribed Xanax 0.25 mg 3 times a day prn, as well as low-dose Celexa generic, both for anxiety.she typically takes her Celexa 10 mg daily and half a Xanax in the morning only. She continues to drink alcohol in the evening every day, limit herself to 6 ounces typically. She feels that this helps her relax and go to sleep easier. She has discussed it with her primary care physician who per her report told her not to worry about it.    The patient's allergies, current  medications, family history, past medical history, past social history, past surgical history and problem list were reviewed and updated as appropriate.    Previously (copied from previous notes for reference):   I saw her on 04/05/2016, at which time she reported taking 3 pills of primidone. We talked about her alcohol consumption. She decided not to pursue DBS surgery. She had gone for a consultation at [facility]. She was wondering if she could try Topamax again.     I saw her on 10/07/2015, at which time she reported that the increase in primidone was somewhat helpful. She was drinking 1 glass of wine daily and was advised to abstain from drinking alcohol altogether. She had a fall in June 2017 which required staples in the back of her head and sutures to the left knee. She was also taking quite a bit of Benadryl at night. She was advised to try melatonin at night for sleep. She also had trouble maintaining her weight. Of note, she went to the ER on 07/18/2015 secondary to fall and laceration to head. She had a head CT and cervical spine CT without contrast on 07/18/2015:  IMPRESSION: 1. Negative for acute intracranial traumatic injury. There is generalized atrophy, chronic microvascular ischemic change and remote left-sided infarction. 2. Negative for acute cervical spine fracture.   She presented to the ER on 09/08/2015 for abdominal pain, nausea, vomiting and diarrhea. She was diagnosed with duodenitis. She had a CT abdomen and pelvis with contrast on  09/08/2015: IMPRESSION: 1. Right upper quadrant inflammation is likely centered on and duodenum, most concerning for duodenitis. There is wall thickening with adjacent fat stranding and small amount of fluid. While the fluid abuts the gallbladder, no signs of gallbladder inflammation. 2. Decreased size of hiatal hernia, now small in size. 3. Chronic incidental findings of hepatic steatosis and abdominal aortic atherosclerosis.   We talked  about DBS evaluation and I made a referral to Dr. Linus Mako.     I saw her on 06/03/2015, at which time she reported doing about the same. She was off of Topamax, this had caused side effects. She had robotic-assisted hiatal hernia surgery on 03/26/2015 with good success but was only able to advance her diet very slowly and had weight loss. This had stabilized recently. She had issues with chronic diarrhea. She was tearful because of ongoing issues with her tremors. She would take her second dose of Mysoline a little earlier when she had plans to go out. She did admit to drinking wine to tone down the tremor. I suggested a cautious increase in primidone 200 mg 3 times a day and continuation of her metoprolol. We talked about potentially trying long-acting Trokendi in the future we also talked about DBS consultation in the future. She was reminded not to utilize alcohol as a treatment for tremors.    I saw her on 03/06/2015, at which time she reported that she had recently stopped the Topamax because of side effects including change in her taste, dizzy spells, mental fogginess and weight loss. She did not feel that it helped or tremor. She reported a recent fall on linoleum floor in the kitchen. Thankfully, she did not hit her head but braced her fall with her left forearm but somehow cut her lower lip. I suggested a 3 month checkup, we talked about potentially  trying long-acting topiramate.   I first met her on 12/26/2014 at the request of her primary care physician, at which time she reported a long-standing history of upper extremity tremors with symptoms dating back to her teenage years with gradual progression. Eventually, she developed a hand and voice tremor. She reported a family history of tremors as well. She was on metoprolol and primidone at the time. We talked about potentially pursuing a consultation for DBS. I suggested a trial of Topamax with gradual titration. I asked her to limit her alcohol  consumption.   12/26/2014: She reports a long-standing history of tremors of her upper extremities. Symptoms started in her teens, but were very mild and intermittent for years. Gradually she became worse. In the past 2 years she hasnoted significant worsening of her tremors. Both hands are affected and also her head and sometimes her voice. Her 74 year old brother has severe tremors. She had 2 sisters, neither one had tremors but she has a strong family history on her father's side with several paternal uncles who had tremors.she is married and lives with her husband. She does not have any children.she has been on primidone for years. Currently 100 mg twice daily. She had limited results from it. She has been on metoprolol for years, currently 50 mg twice daily. She has never had surgical consultation for DBS evaluation for essential tremor. She has never been on topiramate.she has found that alcohol helps and she usually drinks 4 ounces of wine 2-3 times a day. She quit smoking in 1990, and does not drink any caffeine. I reviewed your office note from 12/10/2014, which you kindly included. Blood work  from 12/10/2014 include CMP which was unremarkable with the exception of mildly low sodium at 134, AST mildly elevated at 46, cholesterol increased at 244, triglycerides 152, LDL 137.   Her Past Medical History Is Significant For: Past Medical History:  Diagnosis Date  . Anxiety   . Chronic diarrhea   . Colon polyp   . DDD (degenerative disc disease), cervical   . Hiatal hernia   . History of skin cancer   . Hypercholesteremia   . Hypertension   . Migraines   . Osteoporosis   . Shortness of breath dyspnea   . Tremor   . Weight loss     Her Past Surgical History Is Significant For: Past Surgical History:  Procedure Laterality Date  . ESOPHAGEAL MANOMETRY N/A 01/15/2015   Procedure: ESOPHAGEAL MANOMETRY (EM);  Surgeon: Wonda Horner, MD;  Location: [facility];  Service: Endoscopy;   Laterality: N/A;  . HIATAL HERNIA REPAIR    . RHINOPLASTY    . SINUS SURGERY WITH INSTATRAK      Her Family History Is Significant For: Family History  Problem Relation Age of Onset  . Lung cancer Mother   . COPD Father   . Hypertension Brother     Her Social History Is Significant For: Social History   Socioeconomic History  . Marital status: Married    Spouse name: None  . Number of children: 0  . Years of education: MA  . Highest education level: None  Social Needs  . Financial resource strain: None  . Food insecurity - worry: None  . Food insecurity - inability: None  . Transportation needs - medical: None  . Transportation needs - non-medical: None  Occupational History  . Occupation: Retired  Tobacco Use  . Smoking status: Former Smoker    Last attempt to quit: 09/11/1988    Years since quitting: 28.5  . Smokeless tobacco: Never Used  Substance and Sexual Activity  . Alcohol use: Yes    Alcohol/week: 0.0 oz    Comment: OCCASIONAL  . Drug use: No  . Sexual activity: None  Other Topics Concern  . None  Social History Narrative   Denies any caffeine use     Her Allergies Are:  Allergies  Allergen Reactions  . Macrodantin [Nitrofurantoin Macrocrystal] Itching, Nausea And Vomiting and Rash    Tongue swelling  . Codeine Nausea And Vomiting  . Diclofenac Other (See Comments)    Stomach burning  . Topamax [Topiramate] Other (See Comments)    Loss of taste, fatigue  . Tramadol Other (See Comments)    Stomach burning  :   Her Current Medications Are:  Outpatient Encounter Medications as of 04/06/2017  Medication Sig  . ALPRAZolam (XANAX) 0.25 MG tablet Take 0.25 mg by mouth 3 (three) times daily as needed.  Marland Kitchen atorvastatin (LIPITOR) 20 MG tablet Take 20 mg by mouth every morning.   . butalbital-acetaminophen-caffeine (FIORICET, ESGIC) 50-325-40 MG tablet Take 1 tablet by mouth every 4 (four) hours as needed (tension headache).   . Calcium-Magnesium-Vitamin  D [phone] MG-MG-UNIT TABS Take 1 tablet by mouth every morning.  . citalopram (CELEXA) 10 MG tablet Take 10 mg by mouth daily.  . hydrochlorothiazide (HYDRODIURIL) 25 MG tablet Take 25 mg by mouth every morning.   . metoprolol (LOPRESSOR) 50 MG tablet Take 50 mg by mouth 2 (two) times daily.  . Multiple Vitamins-Minerals (MULTIVITAMIN WITH MINERALS) tablet Take 1 tablet by mouth every morning.   . potassium chloride (K-DUR,KLOR-CON) 10 MEQ  tablet Take 10 mEq by mouth every morning.   . primidone (MYSOLINE) 50 MG tablet Take 3 pills in AM, 2 midday, and 3 pills at night.  . promethazine (PHENERGAN) 25 MG tablet Take 12.5-25 mg by mouth every 8 (eight) hours as needed (stomach pain).   . sucralfate (CARAFATE) 1 g tablet Take 1 tablet (1 g total) by mouth 4 (four) times daily as needed.  . verapamil (CALAN-SR) 240 MG CR tablet Take 240 mg by mouth every morning.  Marland Kitchen VIBERZI 100 MG TABS Take 1 tablet by mouth 2 (two) times daily with a meal.   No facility-administered encounter medications on file as of 04/06/2017.   : Review of Systems:  Out of a complete 14 point review of systems, all are reviewed and negative with the exception of these symptoms as listed below: Review of Systems  Neurological:       Pt presents today to discuss her tremor. Pt feels that she gets anxious when her "tremors come" and has started taking xanax TID and celexa daily.    Objective:  Neurological Exam  Physical Exam Physical Examination:   Vitals:   04/06/17 1301  BP: 134/73  Pulse: 79    General Examination: The patient is a very pleasant 77 y.o. female in no acute distress. She appears well-developed and well-nourished and well groomed.   HEENT:Normocephalic, atraumatic, pupils are equal, round and reactive to light and accommodation. Extraocular tracking is good without limitation to gaze excursion or nystagmus noted. Normal smooth pursuit is noted. Hearing is grossly intact. Face is symmetric with  normal facial animation and normal facial sensation. Speech is slightly dysarthria noted. There is no hypophonia. She has a mild voice tremor, she has no head tremor today. Airway examination reveals mild mouth dryness, adequate dental hygiene, mild airway crowding, Mallampati class II. Tongue protrudes centrally and palate elevates symmetrically, tonsils small.   Chest:Clear to auscultation without wheezing, rhonchi or crackles noted.  Heart:S1+S2+0, regular and normal without murmurs, rubs or gallops noted.   Abdomen:Soft, non-tender and non-distended with normal bowel sounds appreciated on auscultation.  Extremities:There is nopitting edema in the distal lower extremities bilaterally.   Skin: Warm and dry without trophic changes noted.  Musculoskeletal: exam reveals no obvious joint deformities, tenderness or joint swelling or erythema.   Neurologically:  Mental status: The patient is awake, alert and oriented in all 4 spheres. Herimmediate and remote memory, attention, language skills and fund of knowledge are appropriate. There is no evidence of aphasia, agnosia, apraxia or anomia. Speech is clear with normal prosody and enunciation. Thought process is linear. Mood is normaland affect is normal.  Cranial nerves II - XII are as described above under HEENT exam. In addition: shoulder shrug is normal with equal shoulder height noted. Motor exam: Thin bulk, normal strength for age, normal tone. She has no significant resting tremor. She has a very slight bilateral action and postural tremor, improved before, stable from last time. Reflexes are 1-2+ throughout, Romberg is negative. She has no dysmetria or intention tremor. Fine motor skills are age-appropriate.  Sensory exam: intact to light touch in the upper and lower extremities.  Gait, station and balance: Shestands easily. No veering to one side is noted. No leaning to one side is noted. Posture is age-appropriate and stance is  narrow based. Gait shows normalstride length and normalpace. No problems turning are noted. She cannot do tandem walk.  Assessment and Plan:   In summary, TAYJA MANZER a very pleasant  77 year old femalewith anunderlying medical history of migraine headaches, hypertension, degenerative disc disease of the neck, history of sinus disease with status post surgery, osteoporosis, allergies, prior smoking, hiatal hernia, history of chronic diarrhea, with significant weight loss last year, history of fall in June 2017 with head laceration, who presents for follow-up consultation of her long-standing history of essential tremor. She has been on Mysoline for years. She is currently taking 150 mg twice daily consistently and additional 100 mg during midday, about 3-4 days out of the week,  when she remembers. Of note, she has had side effects with Topamax in the past including cognitive changes, taste changes and significant weight loss. Her weight has stabilized and her appetite is good at this time and she has even gained 3 lb, since our last visit. Her exam is stable from last time. She had evaluation for DBS surgery at [facility] in the recent past and was deemed a reasonably good surgical candidate but decided not to pursue surgical treatment. I suggested she continue with her current treatment regimen, long as she continues to work towards eliminating her daily alcohol intake at night. She is exercising on a regular basis and trying to stay well-hydrated for which she is commended. She is again cautioned about her regular/daily alcohol intake and advised that Mysoline does not mix well with alcohol, in addition she has started to additional psychotropic medications recently. She reports that her primary care physician is aware and that he told her not to worry about it but I explained to her that I to not agree with that necessarily, and I do think we need to worry about it. Of note, she did sustain a fall in 2017  in the context of alcohol intoxication. She has reduced her alcohol intake by self-report. She does look better in her tremor examination. Her weight has stabilized and she overall looks well. I renewed her Mysoline prescription for 90 day prescription with refill, I suggested a six-month follow-up with one of our nurse practitioners. I answered all her questions today and the patient was in agreement. I spent 20 minutes in total face-to-face time with the patient, more than 50% of which was spent in counseling and coordination of care, reviewing test results, reviewing medication and discussing or reviewing the diagnosis of ET, its prognosis and treatment options. Pertinent laboratory and imaging test results that were available during this visit with the patient were reviewed by me and considered in my medical decision making (see chart for details).

## 2017-09-12 ENCOUNTER — Other Ambulatory Visit: Payer: Self-pay | Admitting: Family Medicine

## 2017-09-12 ENCOUNTER — Ambulatory Visit
Admission: RE | Admit: 2017-09-12 | Discharge: 2017-09-12 | Disposition: A | Discharge status: Home / Self Care | Payer: Medicare Other | Source: Ambulatory Visit | Attending: Family Medicine | Admitting: Family Medicine

## 2017-09-12 DIAGNOSIS — M7989 Other specified soft tissue disorders: Secondary | ICD-10-CM

## 2017-09-28 ENCOUNTER — Emergency Department (HOSPITAL_COMMUNITY): Payer: Medicare Other

## 2017-09-28 ENCOUNTER — Encounter (HOSPITAL_COMMUNITY): Payer: Self-pay

## 2017-09-28 ENCOUNTER — Emergency Department (HOSPITAL_COMMUNITY)
Admission: EM | Admit: 2017-09-28 | Discharge: 2017-09-28 | Disposition: A | Discharge status: Home / Self Care | Payer: Medicare Other | Attending: Emergency Medicine | Admitting: Emergency Medicine

## 2017-09-28 DIAGNOSIS — R55 Syncope and collapse: Secondary | ICD-10-CM

## 2017-09-28 DIAGNOSIS — I10 Essential (primary) hypertension: Secondary | ICD-10-CM | POA: Insufficient documentation

## 2017-09-28 DIAGNOSIS — Z87891 Personal history of nicotine dependence: Secondary | ICD-10-CM | POA: Diagnosis not present

## 2017-09-28 LAB — URINALYSIS, ROUTINE W REFLEX MICROSCOPIC
Bilirubin Urine: NEGATIVE
GLUCOSE, UA: NEGATIVE mg/dL
Hgb urine dipstick: NEGATIVE
KETONES UR: NEGATIVE mg/dL
Nitrite: NEGATIVE
PROTEIN: NEGATIVE mg/dL
Specific Gravity, Urine: 1.005 (ref 1.005–1.030)
pH: 6 (ref 5.0–8.0)

## 2017-09-28 LAB — CBC
HEMATOCRIT: 39.3 % (ref 36.0–46.0)
Hemoglobin: 12.7 g/dL (ref 12.0–15.0)
MCH: 33.9 pg (ref 26.0–34.0)
MCHC: 32.3 g/dL (ref 30.0–36.0)
MCV: 104.8 fL — AB (ref 78.0–100.0)
PLATELETS: 127 10*3/uL — AB (ref 150–400)
RBC: 3.75 MIL/uL — AB (ref 3.87–5.11)
RDW: 12.7 % (ref 11.5–15.5)
WBC: 6.1 10*3/uL (ref 4.0–10.5)

## 2017-09-28 LAB — BASIC METABOLIC PANEL
Anion gap: 9 (ref 5–15)
BUN: <5 mg/dL — ABNORMAL LOW (ref 8–23)
CHLORIDE: 111 mmol/L (ref 98–111)
CO2: 19 mmol/L — ABNORMAL LOW (ref 22–32)
CREATININE: 0.66 mg/dL (ref 0.44–1.00)
Calcium: 7.3 mg/dL — ABNORMAL LOW (ref 8.9–10.3)
GFR calc Af Amer: >60 mL/min (ref >60)
GFR calc non Af Amer: >60 mL/min (ref >60)
GLUCOSE: 95 mg/dL (ref 70–99)
POTASSIUM: 3.8 mmol/L (ref 3.5–5.1)
SODIUM: 139 mmol/L (ref 135–145)

## 2017-09-28 LAB — I-STAT TROPONIN, ED: TROPONIN I, POC: 0 ng/mL (ref 0.00–0.08)

## 2017-09-28 LAB — ETHANOL: ALCOHOL ETHYL (B): 33 mg/dL — AB (ref <10)

## 2017-09-28 MED ORDER — SODIUM CHLORIDE 0.9 % IV BOLUS: 500.0000 mL | Freq: Once | INTRAVENOUS | Status: DC

## 2017-09-28 NOTE — ED Triage Notes (Signed)
Pt BIB [facility] for eval of syncopal episode while going from sitting to standing getting out of the car. Pt reports she has not eaten or drank all day, and "just felt weak". Denies chest pain, SOB, weakess, prior to episode

## 2017-09-28 NOTE — Discharge Instructions (Signed)

## 2017-09-28 NOTE — ED Provider Notes (Signed)
Emergency Department Provider Note   I have reviewed the triage vital signs and the nursing notes.   HISTORY  Chief Complaint Loss of Consciousness   HPI Brenda [name] is a 77 y.o. female with PMH of HLD, HTN, and anxiety presents to the emergency department for evaluation after syncope and fall.  The patient woke up late today and has not eaten or had much to drink throughout the day.  He is typically not a heavy eater however today she ate even less than normal. She also drank a glass of wine prior to leaving for dinner. Once out of the car the patient felt suddenly lightheaded without chest pain, palpitations, or SOB. She felt very weak and needed to be helped back to her feet by family. Family doubts head injury but not totally sure. EMS arrived on scene and patient is feeling much better after IVF.    Past Medical History:  Diagnosis Date  . Anxiety   . Chronic diarrhea   . Colon polyp   . DDD (degenerative disc disease), cervical   . Hiatal hernia   . History of skin cancer   . Hypercholesteremia   . Hypertension   . Migraines   . Osteoporosis   . Shortness of breath dyspnea   . Tremor   . Weight loss     Patient Active Problem List   Diagnosis Date Noted  . Paraesophageal hiatal hernia s/p repair w biologic mesh 03/26/2015 03/27/2015  . GERD (gastroesophageal reflux disease) s/p Nissen fundoplication 03/26/2015 03/26/2015    Past Surgical History:  Procedure Laterality Date  . ESOPHAGEAL MANOMETRY N/A 01/15/2015   Procedure: ESOPHAGEAL MANOMETRY (EM);  Surgeon: Graylin Shiver, MD;  Location: [facility];  Service: Endoscopy;  Laterality: N/A;  . HIATAL HERNIA REPAIR    . RHINOPLASTY    . SINUS SURGERY WITH INSTATRAK      Allergies Macrodantin [nitrofurantoin macrocrystal]; Codeine; Diclofenac; Topamax [topiramate]; and Tramadol  Family History  Problem Relation Age of Onset  . Lung cancer Mother   . COPD Father   . Hypertension Brother     Social  History Social History   Tobacco Use  . Smoking status: Former Smoker    Last attempt to quit: 09/11/1988    Years since quitting: 29.0  . Smokeless tobacco: Never Used  Substance Use Topics  . Alcohol use: Yes    Alcohol/week: 0.0 standard drinks    Comment: OCCASIONAL  . Drug use: No    Review of Systems  Constitutional: No fever/chills. Positive syncope and generalized weakness.  Eyes: No visual changes. ENT: No sore throat. Cardiovascular: Denies chest pain. Respiratory: Denies shortness of breath. Gastrointestinal: No abdominal pain.  No nausea, no vomiting.  No diarrhea.  No constipation. Genitourinary: Negative for dysuria. Musculoskeletal: Negative for back pain. Skin: Negative for rash. Neurological: Negative for headaches, focal weakness or numbness.  10-point ROS otherwise negative.  ____________________________________________   PHYSICAL EXAM:  VITAL SIGNS: Vitals:   09/28/17 2115 09/28/17 2210  BP: (!) 158/76 (!) 165/65  Pulse: 72 65  Resp: 17 14  Temp:    SpO2: 96% 99%    Constitutional: Alert and oriented. Well appearing and in no acute distress. Eyes: Conjunctivae are normal. PERRL. EOMI. Head: Atraumatic. Nose: No congestion/rhinnorhea. Mouth/Throat: Mucous membranes are moist.   Neck: No stridor. No cervical spine tenderness to palpation. C-collar in place.  Cardiovascular: Normal rate, regular rhythm. Good peripheral circulation. Grossly normal heart sounds.   Respiratory: Normal respiratory effort.  No retractions. Lungs CTAB. Gastrointestinal: Soft and nontender. No distention.  Musculoskeletal: No lower extremity tenderness nor edema. No gross deformities of extremities. Neurologic:  Normal speech and language. No gross focal neurologic deficits are appreciated.  Skin:  Skin is warm, dry and intact. No rash noted.  ____________________________________________   LABS (all labs ordered are listed, but only abnormal results are  displayed)  Labs Reviewed  BASIC METABOLIC PANEL - Abnormal; Notable for the following components:      Result Value   CO2 19 (*)    BUN <5 (*)    Calcium 7.3 (*)    All other components within normal limits  CBC - Abnormal; Notable for the following components:   RBC 3.75 (*)    MCV 104.8 (*)    Platelets 127 (*)    All other components within normal limits  URINALYSIS, ROUTINE W REFLEX MICROSCOPIC - Abnormal; Notable for the following components:   Leukocytes, UA SMALL (*)    Bacteria, UA RARE (*)    All other components within normal limits  ETHANOL - Abnormal; Notable for the following components:   Alcohol, Ethyl (B) 33 (*)    All other components within normal limits  CBG MONITORING, ED  I-STAT TROPONIN, ED   ____________________________________________  EKG  Normal rate, rhythm, and axis. No interval abnormality. No ST elevation or depression. No STEMI.  ____________________________________________  RADIOLOGY  Dg Chest 2 View  Result Date: 09/28/2017 CLINICAL DATA:  Syncope today EXAM: CHEST - 2 VIEW COMPARISON:  October 17, 2014 FINDINGS: The heart size and mediastinal contours are stable. The heart size is enlarged. The lungs are hyperinflated. There is no focal infiltrate, pulmonary edema, or pleural effusion. The visualized skeletal structures are unremarkable. IMPRESSION: No active cardiopulmonary disease.  Hyperinflated lungs. Electronically Signed   By: Sherian Rein M.D.   On: 09/28/2017 21:29   Ct Head Wo Contrast  Result Date: 09/28/2017 CLINICAL DATA:  Syncopal episode with fall EXAM: CT HEAD WITHOUT CONTRAST CT CERVICAL SPINE WITHOUT CONTRAST TECHNIQUE: Multidetector CT imaging of the head and cervical spine was performed following the standard protocol without intravenous contrast. Multiplanar CT image reconstructions of the cervical spine were also generated. COMPARISON:  July 18, 2015 FINDINGS: CT HEAD FINDINGS Brain: No evidence of acute infarction,  hemorrhage, hydrocephalus, extra-axial collection or mass lesion/mass effect. There is chronic diffuse atrophy. Chronic bilateral periventricular white matter small vessel ischemic changes identified. There is old lacunar infarction in the left internal capsule and left basal ganglia unchanged. Vascular: No hyperdense vessel or unexpected calcification. Skull: Normal. Negative for fracture or focal lesion. Sinuses/Orbits: Patient status post prior surgery of the medial wall of maxillary sinuses bilaterally. Mucoperiosteal thickening of bilateral ethmoid, right sphenoid and left maxillary sinuses are identified. Other: None. CT CERVICAL SPINE FINDINGS Alignment: Normal. Skull base and vertebrae: No acute fracture. No primary bone lesion or focal pathologic process. Soft tissues and spinal canal: No prevertebral fluid or swelling. No visible canal hematoma. Disc levels: Degenerative joint changes with narrowed joint space and osteophyte formation are identified in the mid and lower cervical spine. Upper chest: Negative. Other: None. IMPRESSION: No focal acute intracranial abnormality identified. Chronic diffuse atrophy and chronic bilateral periventricular white matter small vessel ischemic change. Stable old lacunar infarction in the left internal capsule and left basal ganglia. No acute fracture or dislocation of cervical spine. Electronically Signed   By: Sherian Rein M.D.   On: 09/28/2017 21:14   Ct Cervical Spine Wo Contrast  Result  Date: 09/28/2017 CLINICAL DATA:  Syncopal episode with fall EXAM: CT HEAD WITHOUT CONTRAST CT CERVICAL SPINE WITHOUT CONTRAST TECHNIQUE: Multidetector CT imaging of the head and cervical spine was performed following the standard protocol without intravenous contrast. Multiplanar CT image reconstructions of the cervical spine were also generated. COMPARISON:  July 18, 2015 FINDINGS: CT HEAD FINDINGS Brain: No evidence of acute infarction, hemorrhage, hydrocephalus, extra-axial  collection or mass lesion/mass effect. There is chronic diffuse atrophy. Chronic bilateral periventricular white matter small vessel ischemic changes identified. There is old lacunar infarction in the left internal capsule and left basal ganglia unchanged. Vascular: No hyperdense vessel or unexpected calcification. Skull: Normal. Negative for fracture or focal lesion. Sinuses/Orbits: Patient status post prior surgery of the medial wall of maxillary sinuses bilaterally. Mucoperiosteal thickening of bilateral ethmoid, right sphenoid and left maxillary sinuses are identified. Other: None. CT CERVICAL SPINE FINDINGS Alignment: Normal. Skull base and vertebrae: No acute fracture. No primary bone lesion or focal pathologic process. Soft tissues and spinal canal: No prevertebral fluid or swelling. No visible canal hematoma. Disc levels: Degenerative joint changes with narrowed joint space and osteophyte formation are identified in the mid and lower cervical spine. Upper chest: Negative. Other: None. IMPRESSION: No focal acute intracranial abnormality identified. Chronic diffuse atrophy and chronic bilateral periventricular white matter small vessel ischemic change. Stable old lacunar infarction in the left internal capsule and left basal ganglia. No acute fracture or dislocation of cervical spine. Electronically Signed   By: Sherian Rein M.D.   On: 09/28/2017 21:14    ____________________________________________   PROCEDURES  Procedure(s) performed:   Procedures  None ____________________________________________   INITIAL IMPRESSION / ASSESSMENT AND PLAN / ED COURSE  Pertinent labs & imaging results that were available during my care of the patient were reviewed by me and considered in my medical decision making (see chart for details).  Patient presents to the ED after syncope in the setting of not eating food today and drinking very little. Normal neuro exam. Labs unremarkable. Reviewed imaging and  EKG. Patient feeling dramatically better after IVF. Plan for discharge. Sending UA for culture with no UTI symptoms.  At this time, I do not feel there is any life-threatening condition present. I have reviewed and discussed all results (EKG, imaging, lab, urine as appropriate), exam findings with patient. I have reviewed nursing notes and appropriate previous records.  I feel the patient is safe to be discharged home without further emergent workup. Discussed usual and customary return precautions. Patient and family (if present) verbalize understanding and are comfortable with this plan.  Patient will follow-up with their primary care provider. If they do not have a primary care provider, information for follow-up has been provided to them. All questions have been answered.  ____________________________________________  FINAL CLINICAL IMPRESSION(S) / ED DIAGNOSES  Final diagnoses:  Syncope and collapse   Note:  This document was prepared using Dragon voice recognition software and may include unintentional dictation errors.  Alona Bene, MD Emergency Medicine    Long, Arlyss Repress, MD 09/29/17 [phone]

## 2017-10-17 ENCOUNTER — Ambulatory Visit: Payer: Medicare Other | Admitting: Adult Health

## 2017-10-17 ENCOUNTER — Encounter: Payer: Self-pay | Admitting: Adult Health

## 2017-10-17 VITALS — BP 116/58 | HR 71 | Ht 64.0 in | Wt 116.6 lb

## 2017-10-17 DIAGNOSIS — G250 Essential tremor: Secondary | ICD-10-CM

## 2017-10-17 NOTE — Patient Instructions (Signed)
Your Plan:  Continue Primidone If your symptoms worsen or you develop new symptoms please let us know.    Thank you for coming to see us at [facility]. I hope we have been able to provide you high quality care today.  You may receive a patient satisfaction survey over the next few weeks. We would appreciate your feedback and comments so that we may continue to improve ourselves and the health of our patients.  

## 2017-10-17 NOTE — Progress Notes (Addendum)
PATIENT: Brenda [name] DOB: Nov 28, 1940  REASON FOR VISIT: follow up HISTORY FROM: patient  HISTORY OF PRESENT ILLNESS: Today 10/17/17:  Brenda [name] is a 77 year old female with a history of essential tremor.  She returns today for follow-up.  She is currently on Mysoline and is tolerating well.  She has found it beneficial for her tremors.  She states that the tremor primarily affects the hands and head.  She does use Xanax approximately once a week.  She states that when she gets upset the tremor is worse.  She is able to complete most ADLs independently.  She states that she is no longer able to thread a needle or write a check.  Is able to feed herself.  Reports that she goes out to eat she tries to take her Mysoline before she goes.  She returns today for evaluation.  HISTORY She reports still taking mysoline 50 mg with success. She takes 3 pills in the morning and 3 at night and most days of the week she remembers to take 2 pills during the midday. She has had more anxiety which at times seems overwhelming to her. Her primary care physician prescribed Xanax 0.25 mg 3 times a day prn, as well as low-dose Celexa generic, both for anxiety.she typically takes her Celexa 10 mg daily and half a Xanax in the morning only. She continues to drink alcohol in the evening every day, limit herself to 6 ounces typically. She feels that this helps her relax and go to sleep easier. She has discussed it with her primary care physician who per her report told her not to worry about it.   The patient's allergies, current medications, family history, past medical history, past social history, past surgical history and problem list were reviewed and updated as appropriate  REVIEW OF SYSTEMS: Out of a complete 14 system review of symptoms, the patient complains only of the following symptoms, and all other reviewed systems are negative.  See HPI  ALLERGIES: Allergies  Allergen Reactions  . Macrodantin  [Nitrofurantoin Macrocrystal] Itching, Nausea And Vomiting and Rash    Tongue swelling  . Codeine Nausea And Vomiting  . Diclofenac Other (See Comments)    Stomach burning  . Topamax [Topiramate] Other (See Comments)    Loss of taste, fatigue  . Tramadol Other (See Comments)    Stomach burning    HOME MEDICATIONS: Outpatient Medications Prior to Visit  Medication Sig Dispense Refill  . ALPRAZolam (XANAX) 0.25 MG tablet Take 0.25 mg by mouth 3 (three) times daily as needed.  0  . atorvastatin (LIPITOR) 20 MG tablet Take 20 mg by mouth every morning.     . butalbital-acetaminophen-caffeine (FIORICET, ESGIC) 50-325-40 MG tablet Take 1 tablet by mouth every 4 (four) hours as needed (tension headache).   0  . Calcium-Magnesium-Vitamin D 500-250-125 MG-MG-UNIT TABS Take 1 tablet by mouth every morning.    . citalopram (CELEXA) 10 MG tablet Take 10 mg by mouth daily.  12  . lisinopril (PRINIVIL,ZESTRIL) 10 MG tablet Take 10 mg by mouth daily.  12  . metoprolol (LOPRESSOR) 50 MG tablet Take 50 mg by mouth 2 (two) times daily.    . Multiple Vitamins-Minerals (ICAPS AREDS 2 PO) Take 2 tablets by mouth daily.    . Multiple Vitamins-Minerals (MULTIVITAMIN WITH MINERALS) tablet Take 1 tablet by mouth every morning.     . primidone (MYSOLINE) 50 MG tablet Take 3 pills in AM, 2 midday, and 3 pills at night.  720 tablet 3  . promethazine (PHENERGAN) 25 MG tablet Take 12.5-25 mg by mouth every 8 (eight) hours as needed (stomach pain).   1  . sucralfate (CARAFATE) 1 g tablet Take 1 tablet (1 g total) by mouth 4 (four) times daily as needed. 30 tablet 0  . verapamil (CALAN-SR) 240 MG CR tablet Take 240 mg by mouth every morning.  11  . VIBERZI 100 MG TABS Take 1 tablet by mouth 2 (two) times daily with a meal.  4  . potassium chloride (K-DUR,KLOR-CON) 10 MEQ tablet Take 10 mEq by mouth every morning.     . hydrochlorothiazide (HYDRODIURIL) 25 MG tablet Take 25 mg by mouth every morning.      No  facility-administered medications prior to visit.     PAST MEDICAL HISTORY: Past Medical History:  Diagnosis Date  . Anxiety   . Chronic diarrhea   . Colon polyp   . DDD (degenerative disc disease), cervical   . Hiatal hernia   . History of skin cancer   . Hypercholesteremia   . Hypertension   . Migraines   . Osteoporosis   . Shortness of breath dyspnea   . Tremor   . Weight loss     PAST SURGICAL HISTORY: Past Surgical History:  Procedure Laterality Date  . ESOPHAGEAL MANOMETRY N/A 01/15/2015   Procedure: ESOPHAGEAL MANOMETRY (EM);  Surgeon: Graylin Shiver, MD;  Location: [facility];  Service: Endoscopy;  Laterality: N/A;  . HIATAL HERNIA REPAIR    . RHINOPLASTY    . SINUS SURGERY WITH INSTATRAK      FAMILY HISTORY: Family History  Problem Relation Age of Onset  . Lung cancer Mother   . COPD Father   . Hypertension Brother     SOCIAL HISTORY: Social History   Socioeconomic History  . Marital status: Married    Spouse name: Not on file  . Number of children: 0  . Years of education: MA  . Highest education level: Not on file  Occupational History  . Occupation: Retired  Engineer, production  . Financial resource strain: Not on file  . Food insecurity:    Worry: Not on file    Inability: Not on file  . Transportation needs:    Medical: Not on file    Non-medical: Not on file  Tobacco Use  . Smoking status: Former Smoker    Last attempt to quit: 09/11/1988    Years since quitting: 29.1  . Smokeless tobacco: Never Used  Substance and Sexual Activity  . Alcohol use: Yes    Alcohol/week: 0.0 standard drinks    Comment: OCCASIONAL  . Drug use: No  . Sexual activity: Not on file  Lifestyle  . Physical activity:    Days per week: Not on file    Minutes per session: Not on file  . Stress: Not on file  Relationships  . Social connections:    Talks on phone: Not on file    Gets together: Not on file    Attends religious service: Not on file    Active member  of club or organization: Not on file    Attends meetings of clubs or organizations: Not on file    Relationship status: Not on file  . Intimate partner violence:    Fear of current or ex partner: Not on file    Emotionally abused: Not on file    Physically abused: Not on file    Forced sexual activity: Not on file  Other Topics Concern  . Not on file  Social History Narrative   Denies any caffeine use       PHYSICAL EXAM  Vitals:   10/17/17 1140  BP: (!) 116/58  Pulse: 71  Weight: 116 lb 9.6 oz (52.9 kg)  Height: 5\' 4"  (1.626 m)   Body mass index is 20.01 kg/m.  Generalized: Well developed, in no acute distress   Neurological examination  Mentation: Alert oriented to time, place, history taking. Follows all commands speech and language fluent Cranial nerve II-XII:  Extraocular movements were full, visual field were full on confrontational test. Facial sensation and strength were normal. Uvula tongue midline. Head turning and shoulder shrug  were normal and symmetric.  Tremor noted in hands Motor: The motor testing reveals 5 over 5 strength of all 4 extremities. Good symmetric motor tone is noted throughout.  Sensory: Sensory testing is intact to soft touch on all 4 extremities. No evidence of extinction is noted.  Coordination: Cerebellar testing reveals good finger-nose-finger and heel-to-shin bilaterally.  Intention tremor noted in the upper extremities Gait and station: Gait is normal.  Reflexes: Deep tendon reflexes are symmetric and normal bilaterally.   DIAGNOSTIC DATA (LABS, IMAGING, TESTING) - I reviewed patient records, labs, notes, testing and imaging myself where available.  Lab Results  Component Value Date   WBC 6.1 09/28/2017   HGB 12.7 09/28/2017   HCT 39.3 09/28/2017   MCV 104.8 (H) 09/28/2017   PLT 127 (L) 09/28/2017      Component Value Date/Time   NA 139 09/28/2017 1956   K 3.8 09/28/2017 1956   CL 111 09/28/2017 1956   CO2 19 (L) 09/28/2017  1956   GLUCOSE 95 09/28/2017 1956   BUN <5 (L) 09/28/2017 1956   CREATININE 0.66 09/28/2017 1956   CALCIUM 7.3 (L) 09/28/2017 1956   PROT 6.8 09/08/2015 1718   ALBUMIN 3.1 (L) 09/08/2015 1718   AST 42 (H) 09/08/2015 1718   ALT 26 09/08/2015 1718   ALKPHOS 100 09/08/2015 1718   BILITOT 0.6 09/08/2015 1718   GFRNONAA >60 09/28/2017 1956   GFRAA >60 09/28/2017 1956    Lab Results  Component Value Date   HGBA1C 6.3 (H) 03/27/2015     ASSESSMENT AND PLAN 77 y.o. year old female  has a past medical history of Anxiety, Chronic diarrhea, Colon polyp, DDD (degenerative disc disease), cervical, Hiatal hernia, History of skin cancer, Hypercholesteremia, Hypertension, Migraines, Osteoporosis, Shortness of breath dyspnea, Tremor, and Weight loss. here with:  1.  Essential tremor  Overall the patient has remained stable.  She will continue on Mysoline.  I have advised that if her symptoms worsen or she develops new symptoms she should let us know.  She will follow-up in 6 to 8 months or sooner if needed.   Butch Penny, MSN, NP-C 10/17/2017, 11:46 AM [facility] [address], [address] [place], [place] [address] [phone]  I reviewed the above note and documentation by the Nurse Practitioner and agree with the history, physical exam, assessment and plan as outlined above. I was immediately available for face-to-face consultation. Huston Foley, MD, PhD [facility])

## 2017-11-28 ENCOUNTER — Ambulatory Visit
Admission: RE | Admit: 2017-11-28 | Discharge: 2017-11-28 | Disposition: A | Discharge status: Home / Self Care | Payer: Medicare Other | Source: Ambulatory Visit | Attending: Family Medicine | Admitting: Family Medicine

## 2017-11-28 ENCOUNTER — Other Ambulatory Visit: Payer: Self-pay | Admitting: Family Medicine

## 2017-11-28 DIAGNOSIS — M545 Low back pain, unspecified: Secondary | ICD-10-CM

## 2017-12-13 ENCOUNTER — Other Ambulatory Visit: Payer: Self-pay | Admitting: Orthopedic Surgery

## 2017-12-19 NOTE — Pre-Procedure Instructions (Signed)
Rico SheehanBonnie W Hnat  12/19/2017      [id number] - , [place] - [address] [facility] [address] [address] [place] [address] Phone: [phone] Fax: [phone]    Your procedure is scheduled on December 5th.  [facility] Ad[facility] at 9:15 A.M.  Call this number if you have problems the morning of surgery:  [phone]   Remember:  Do not eat or drink after midnight.     Take these medicines the morning of surgery with A SIP OF WATER    Alprazolam (Xanax)  Budesonide  Citalopram (Celexa)  Hydrocodone (Vicodin) - if needed  Metoprolol (Lopressor)  Oxycodone - if needed  Primidone (Mysoline)  Promethazine (Phenergan)  Tizanidine (Zanaflex)   7 days prior to surgery STOP taking any Aspirin(unless otherwise instructed by your surgeon), Aleve, Naproxen, Ibuprofen, Motrin, Advil, Goody's, BC's, all herbal medications, fish oil, and all vitamins     Do not wear jewelry, make-up or nail polish.  Do not wear lotions, powders, or perfumes, or deodorant.  Do not shave 48 hours prior to surgery.  Men may shave face and neck.  Do not bring valuables to the hospital.  [facility] is not responsible for any belongings or valuables.   [place]- Preparing For Surgery  Before surgery, you can play an important role. Because skin is not sterile, your skin needs to be as free of germs as possible. You can reduce the number of germs on your skin by washing with CHG (chlorahexidine gluconate) Soap before surgery.  CHG is an antiseptic cleaner which kills germs and bonds with the skin to continue killing germs even after washing.    Oral Hygiene is also important to reduce your risk of infection.  Remember - BRUSH YOUR TEETH THE MORNING OF SURGERY WITH YOUR REGULAR TOOTHPASTE  Please do not use if you have an allergy to CHG or antibacterial soaps. If your skin becomes reddened/irritated stop using the CHG.  Do not shave  (including legs and underarms) for at least 48 hours prior to first CHG shower. It is OK to shave your face.  Please follow these instructions carefully.   1. Shower the NIGHT BEFORE SURGERY and the MORNING OF SURGERY with CHG.   2. If you chose to wash your hair, wash your hair first as usual with your normal shampoo.  3. After you shampoo, rinse your hair and body thoroughly to remove the shampoo.  4. Use CHG as you would any other liquid soap. You can apply CHG directly to the skin and wash gently with a scrungie or a clean washcloth.   5. Apply the CHG Soap to your body ONLY FROM THE NECK DOWN.  Do not use on open wounds or open sores. Avoid contact with your eyes, ears, mouth and genitals (private parts). Wash Face and genitals (private parts)  with your normal soap.  6. Wash thoroughly, paying special attention to the area where your surgery will be performed.  7. Thoroughly rinse your body with warm water from the neck down.  8. DO NOT shower/wash with your normal soap after using and rinsing off the CHG Soap.  9. Pat yourself dry with a CLEAN TOWEL.  10. Wear CLEAN PAJAMAS to bed the night before surgery, wear comfortable clothes the morning of surgery  11. Place CLEAN SHEETS on your bed the night of your first shower and DO NOT SLEEP WITH PETS.  Day of Surgery:  Do not apply any deodorants/lotions.  Please wear clean clothes to the hospital/surgery center.   Remember to brush your teeth WITH YOUR REGULAR TOOTHPASTE.   Contacts, dentures or bridgework may not be worn into surgery.  Leave your suitcase in the car.  After surgery it may be brought to your room.  For patients admitted to the hospital, discharge time will be determined by your treatment team.  Patients discharged the day of surgery will not be allowed to drive home.    Please read over the following fact sheets that you were given. Coughing and Deep Breathing, MRSA Information and Surgical Site Infection  Prevention

## 2017-12-20 ENCOUNTER — Encounter (HOSPITAL_COMMUNITY)
Admission: RE | Admit: 2017-12-20 | Discharge: 2017-12-20 | Disposition: A | Discharge status: Home / Self Care | Payer: Medicare Other | Source: Ambulatory Visit | Attending: Orthopedic Surgery | Admitting: Orthopedic Surgery

## 2017-12-20 ENCOUNTER — Encounter (HOSPITAL_COMMUNITY): Payer: Self-pay

## 2017-12-20 ENCOUNTER — Other Ambulatory Visit: Payer: Self-pay

## 2017-12-20 DIAGNOSIS — K449 Diaphragmatic hernia without obstruction or gangrene: Secondary | ICD-10-CM | POA: Diagnosis not present

## 2017-12-20 DIAGNOSIS — F419 Anxiety disorder, unspecified: Secondary | ICD-10-CM | POA: Diagnosis not present

## 2017-12-20 DIAGNOSIS — Z01812 Encounter for preprocedural laboratory examination: Secondary | ICD-10-CM | POA: Insufficient documentation

## 2017-12-20 DIAGNOSIS — Z85828 Personal history of other malignant neoplasm of skin: Secondary | ICD-10-CM | POA: Diagnosis not present

## 2017-12-20 DIAGNOSIS — Z79899 Other long term (current) drug therapy: Secondary | ICD-10-CM | POA: Diagnosis not present

## 2017-12-20 DIAGNOSIS — M4856XA Collapsed vertebra, not elsewhere classified, lumbar region, initial encounter for fracture: Secondary | ICD-10-CM | POA: Diagnosis not present

## 2017-12-20 DIAGNOSIS — E7800 Pure hypercholesterolemia, unspecified: Secondary | ICD-10-CM | POA: Diagnosis not present

## 2017-12-20 DIAGNOSIS — Z87891 Personal history of nicotine dependence: Secondary | ICD-10-CM | POA: Diagnosis not present

## 2017-12-20 DIAGNOSIS — I10 Essential (primary) hypertension: Secondary | ICD-10-CM | POA: Diagnosis not present

## 2017-12-20 HISTORY — DX: Other specified forms of tremor: G25.2

## 2017-12-20 LAB — CBC WITH DIFFERENTIAL/PLATELET
Abs Immature Granulocytes: 0.02 10*3/uL (ref 0.00–0.07)
Basophils Absolute: 0.1 10*3/uL (ref 0.0–0.1)
Basophils Relative: 1 %
Eosinophils Absolute: 0.1 10*3/uL (ref 0.0–0.5)
Eosinophils Relative: 2 %
HCT: 44.3 % (ref 36.0–46.0)
Hemoglobin: 14 g/dL (ref 12.0–15.0)
Immature Granulocytes: 0 %
LYMPHS PCT: 25 %
Lymphs Abs: 1.7 10*3/uL (ref 0.7–4.0)
MCH: 32.9 pg (ref 26.0–34.0)
MCHC: 31.6 g/dL (ref 30.0–36.0)
MCV: 104 fL — ABNORMAL HIGH (ref 80.0–100.0)
Monocytes Absolute: 0.6 10*3/uL (ref 0.1–1.0)
Monocytes Relative: 8 %
Neutro Abs: 4.3 10*3/uL (ref 1.7–7.7)
Neutrophils Relative %: 64 %
Platelets: 190 10*3/uL (ref 150–400)
RBC: 4.26 MIL/uL (ref 3.87–5.11)
RDW: 11.8 % (ref 11.5–15.5)
WBC: 6.8 10*3/uL (ref 4.0–10.5)
nRBC: 0 % (ref 0.0–0.2)

## 2017-12-20 LAB — COMPREHENSIVE METABOLIC PANEL
ALT: 65 U/L — ABNORMAL HIGH (ref 0–44)
AST: 120 U/L — ABNORMAL HIGH (ref 15–41)
Albumin: 3.2 g/dL — ABNORMAL LOW (ref 3.5–5.0)
Alkaline Phosphatase: 268 U/L — ABNORMAL HIGH (ref 38–126)
Anion gap: 17 — ABNORMAL HIGH (ref 5–15)
BUN: 15 mg/dL (ref 8–23)
CO2: 17 mmol/L — ABNORMAL LOW (ref 22–32)
Calcium: 8.4 mg/dL — ABNORMAL LOW (ref 8.9–10.3)
Chloride: 99 mmol/L (ref 98–111)
Creatinine, Ser: 1.34 mg/dL — ABNORMAL HIGH (ref 0.44–1.00)
GFR calc Af Amer: 45 mL/min — ABNORMAL LOW (ref >60)
GFR calc non Af Amer: 39 mL/min — ABNORMAL LOW (ref >60)
Glucose, Bld: 94 mg/dL (ref 70–99)
Potassium: 4.1 mmol/L (ref 3.5–5.1)
Sodium: 133 mmol/L — ABNORMAL LOW (ref 135–145)
Total Bilirubin: 1.3 mg/dL — ABNORMAL HIGH (ref 0.3–1.2)
Total Protein: 7.7 g/dL (ref 6.5–8.1)

## 2017-12-20 LAB — SURGICAL PCR SCREEN
MRSA, PCR: NEGATIVE
Staphylococcus aureus: NEGATIVE

## 2017-12-20 LAB — TYPE AND SCREEN
ABO/RH(D): A POS
Antibody Screen: NEGATIVE

## 2017-12-20 LAB — ABO/RH: ABO/RH(D): A POS

## 2017-12-20 LAB — PROTIME-INR
INR: 1.29
Prothrombin Time: 15.9 seconds — ABNORMAL HIGH (ref 11.4–15.2)

## 2017-12-20 LAB — APTT: aPTT: 32 seconds (ref 24–36)

## 2017-12-20 NOTE — Progress Notes (Addendum)
PCP  Dr. Lupe Carneyean Mitchell  Pt. Denies any cardiac history or cardiac testing.  Pt. Unable to give urine specimen,will bring DOS

## 2017-12-20 NOTE — Pre-Procedure Instructions (Signed)
Brenda SheehanBonnie W [name]  12/20/2017      [id number] - [place], [place] - [address] [facility] [address] [place] [address] Phone: [phone] Fax: [phone]    Your procedure is scheduled on December 5th.  [facility] at 8:15 A.M.  Call this number if you have problems the morning of surgery:  [phone]   Remember:  Do not eat or drink after midnight.     Take these medicines the morning of surgery with A SIP OF WATER    Alprazolam (Xanax)  Budesonide  Citalopram (Celexa)  Hydrocodone (Vicodin) - if needed  Metoprolol (Lopressor)  Oxycodone - if needed  Primidone (Mysoline)  Promethazine (Phenergan)  Tizanidine (Zanaflex)   7 days prior to surgery STOP taking any Aspirin (unless otherwise instructed by your surgeon), Aleve, Naproxen, Ibuprofen, Motrin, Advil, Goody's, BC's, all herbal medications, fish oil, and all vitamins     Do not wear jewelry, make-up or nail polish.  Do not wear lotions, powders, or perfumes, or deodorant.  Do not shave 48 hours prior to surgery.  Men may shave face and neck.  Do not bring valuables to the hospital.  [facility] is not responsible for any belongings or valuables.   [place]- Preparing For Surgery  Before surgery, you can play an important role. Because skin is not sterile, your skin needs to be as free of germs as possible. You can reduce the number of germs on your skin by washing with CHG (chlorahexidine gluconate) Soap before surgery.  CHG is an antiseptic cleaner which kills germs and bonds with the skin to continue killing germs even after washing.    Oral Hygiene is also important to reduce your risk of infection.  Remember - BRUSH YOUR TEETH THE MORNING OF SURGERY WITH YOUR REGULAR TOOTHPASTE  Please do not use if you have an allergy to CHG or antibacterial soaps. If your skin becomes reddened/irritated stop using the CHG.  Do not shave  (including legs and underarms) for at least 48 hours prior to first CHG shower. It is OK to shave your face.  Please follow these instructions carefully.   1. Shower the NIGHT BEFORE SURGERY and the MORNING OF SURGERY with CHG.   2. If you chose to wash your hair, wash your hair first as usual with your normal shampoo.  3. After you shampoo, rinse your hair and body thoroughly to remove the shampoo.  4. Use CHG as you would any other liquid soap. You can apply CHG directly to the skin and wash gently with a scrungie or a clean washcloth.   5. Apply the CHG Soap to your body ONLY FROM THE NECK DOWN.  Do not use on open wounds or open sores. Avoid contact with your eyes, ears, mouth and genitals (private parts). Wash Face and genitals (private parts)  with your normal soap.  6. Wash thoroughly, paying special attention to the area where your surgery will be performed.  7. Thoroughly rinse your body with warm water from the neck down.  8. DO NOT shower/wash with your normal soap after using and rinsing off the CHG Soap.  9. Pat yourself dry with a CLEAN TOWEL.  10. Wear CLEAN PAJAMAS to bed the night before surgery, wear comfortable clothes the morning of surgery  11. Place CLEAN SHEETS on your bed the night of your first shower and DO NOT SLEEP WITH PETS.  Day of Surgery:  Do not apply any deodorants/lotions.  Please wear clean clothes to the hospital/surgery center.   Remember to brush your teeth WITH YOUR REGULAR TOOTHPASTE.   Contacts, dentures or bridgework may not be worn into surgery.  Leave your suitcase in the car.  After surgery it may be brought to your room.  For patients admitted to the hospital, discharge time will be determined by your treatment team.  Patients discharged the day of surgery will not be allowed to drive home.    Please read over the following fact sheets that you were given. Coughing and Deep Breathing, MRSA Information and Surgical Site Infection  Prevention

## 2017-12-21 NOTE — Anesthesia Preprocedure Evaluation (Addendum)
Anesthesia Evaluation  Patient identified by MRN, date of birth, ID band Patient awake    Reviewed: Allergy & Precautions, H&P , NPO status , Patient's Chart, lab work & pertinent test results  Airway Mallampati: II  TM Distance: >3 FB Neck ROM: Full    Dental no notable dental hx. (+) Teeth Intact, Dental Advisory Given   Pulmonary shortness of breath, former smoker,    Pulmonary exam normal breath sounds clear to auscultation       Cardiovascular Exercise Tolerance: Good hypertension, Pt. on medications and Pt. on home beta blockers Normal cardiovascular exam Rhythm:Regular Rate:Normal     Neuro/Psych  Headaches, Anxiety    GI/Hepatic GERD  ,  Endo/Other  negative endocrine ROS  Renal/GU negative Renal ROS     Musculoskeletal  (+) Arthritis ,   Abdominal   Peds  Hematology   Anesthesia Other Findings   Reproductive/Obstetrics                           Lab Results  Component Value Date   CREATININE 1.34 (H) 12/20/2017   BUN 15 12/20/2017   NA 133 (L) 12/20/2017   K 4.1 12/20/2017   CL 99 12/20/2017   CO2 17 (L) 12/20/2017    Lab Results  Component Value Date   WBC 6.8 12/20/2017   HGB 14.0 12/20/2017   HCT 44.3 12/20/2017   MCV 104.0 (H) 12/20/2017   PLT 190 12/20/2017    Anesthesia Physical Anesthesia Plan  ASA: III  Anesthesia Plan: General   Post-op Pain Management:    Induction: Intravenous  PONV Risk Score and Plan: 3 and Dexamethasone, Ondansetron and Treatment may vary due to age or medical condition  Airway Management Planned: Oral ETT  Additional Equipment:   Intra-op Plan:   Post-operative Plan:   Informed Consent: I have reviewed the patients History and Physical, chart, labs and discussed the procedure including the risks, benefits and alternatives for the proposed anesthesia with the patient or authorized representative who has indicated his/her  understanding and acceptance.   Dental advisory given  Plan Discussed with: CRNA  Anesthesia Plan Comments: (See PAT note 12/21/2017 by Antionette PolesJames Burns, PA-C)      Anesthesia Quick Evaluation

## 2017-12-21 NOTE — Progress Notes (Signed)
Anesthesia Chart Review:  Case:  [id number] Date/Time:  12/22/17 1100   Procedure:  LUMBAR 2 KYPHOPLASTY (N/A )   Anesthesia type:  General   Pre-op diagnosis:  LUMBAR 2 COMPRESSION FRACTURE RESULTING IN SEVERE ONGOING PAIN   Location:  [facility] ROOM 18 / [facility]   Surgeon:  Phylliss Bob, MD      DISCUSSION: 77 yo female former smoker. Pertinent hx includes HTN, essential tremor, Hiatal hernia, HLD, Anxiety, Migraines, Osteoporosis, Chronic diarrhea..  On PAT labs pt noted to have elevated AST, ALT, Alk phos, and creatinine. Records from PCP reviewed, last labs 08/12/17 show abnormal AST 48, and K+ 3.3 otherwise WNL.  Discussed case with Dr. Fransisco Beau. Elevated alk phos likely related to subacute lumbar compression fracture. Elevated creatinine without history of CKD most consistent with dehydration which would not be unexpected in an older pt with mobility limited secondary to fracture and pain. Elevated AST/ALT without clear proximate cause. The ratio suggests possible ETOH related. Review of records shows that she was seen in the ED 09/28/2017 for syncope and fall - the pt admitted to eating and drinking very little during the day and drinking wine prior to syncopal event. Workup was benign, EKG with sinus rhythm, event felt likely due to dehydration, she reported feeling much better after IVF.  Anticipate she can proceed as planned barring acute status change.  VS: BP (!) 153/88   Pulse 89   Temp (!) 36.4 C   Resp 20   Ht _0  (1.6 m)   Wt 49.2 kg   SpO2 97%   BMI 19.22 kg/m   PROVIDERS: Mitchell, L.Marlou Sa, MD is PCP   LABS: Lab abnormalities as discussed above. Comparison labs from PCP 08/12/17 on pt chart. (all labs ordered are listed, but only abnormal results are displayed)  Labs Reviewed  CBC WITH DIFFERENTIAL/PLATELET - Abnormal; Notable for the following components:      Result Value   MCV 104.0 (*)    All other components within normal limits  COMPREHENSIVE METABOLIC PANEL -  Abnormal; Notable for the following components:   Sodium 133 (*)    CO2 17 (*)    Creatinine, Ser 1.34 (*)    Calcium 8.4 (*)    Albumin 3.2 (*)    AST 120 (*)    ALT 65 (*)    Alkaline Phosphatase 268 (*)    Total Bilirubin 1.3 (*)    GFR calc non Af Amer 39 (*)    GFR calc Af Amer 45 (*)    Anion gap 17 (*)    All other components within normal limits  PROTIME-INR - Abnormal; Notable for the following components:   Prothrombin Time 15.9 (*)    All other components within normal limits  SURGICAL PCR SCREEN  APTT  TYPE AND SCREEN  ABO/RH     IMAGES: CHEST - 2 VIEW 09/28/2017  COMPARISON:  October 17, 2014  FINDINGS: The heart size and mediastinal contours are stable. The heart size is enlarged. The lungs are hyperinflated. There is no focal infiltrate, pulmonary edema, or pleural effusion. The visualized skeletal structures are unremarkable.  IMPRESSION: No active cardiopulmonary disease.  Hyperinflated lungs.   EKG: 09/29/2017:  Sinus rhythm. Rate 67.  CV: N/A  Past Medical History:  Diagnosis Date  . Anxiety   . Chronic diarrhea   . Coarse tremors   . Colon polyp   . DDD (degenerative disc disease), cervical   . Hiatal hernia   . History of  skin cancer   . Hypercholesteremia   . Hypertension   . Migraines   . Osteoporosis   . Shortness of breath dyspnea    with exertion  . Tremor   . Weight loss     Past Surgical History:  Procedure Laterality Date  . ESOPHAGEAL MANOMETRY N/A 01/15/2015   Procedure: ESOPHAGEAL MANOMETRY (EM);  Surgeon: Wonda Horner, MD;  Location: [facility];  Service: Endoscopy;  Laterality: N/A;  . HIATAL HERNIA REPAIR    . RHINOPLASTY    . SINUS SURGERY WITH INSTATRAK      MEDICATIONS: . ALPRAZolam (XANAX) 0.25 MG tablet  . atorvastatin (LIPITOR) 20 MG tablet  . budesonide (ENTOCORT EC) 3 MG 24 hr capsule  . butalbital-acetaminophen-caffeine (FIORICET, ESGIC) 50-325-40 MG tablet  . Calcium-Magnesium-Vitamin D  [phone] MG-MG-UNIT TABS  . citalopram (CELEXA) 10 MG tablet  . HYDROcodone-acetaminophen (NORCO/VICODIN) 5-325 MG tablet  . lisinopril (PRINIVIL,ZESTRIL) 10 MG tablet  . metoprolol (LOPRESSOR) 50 MG tablet  . Multiple Vitamins-Minerals (ICAPS AREDS 2 PO)  . Multiple Vitamins-Minerals (MULTIVITAMIN WITH MINERALS) tablet  . oxyCODONE-acetaminophen (PERCOCET/ROXICET) 5-325 MG tablet  . primidone (MYSOLINE) 50 MG tablet  . promethazine (PHENERGAN) 25 MG tablet  . sucralfate (CARAFATE) 1 g tablet  . tiZANidine (ZANAFLEX) 2 MG tablet  . verapamil (CALAN-SR) 240 MG CR tablet  . VIBERZI 100 MG TABS   No current facility-administered medications for this encounter.     Wynonia Musty [facility] Short Stay Center/Anesthesiology Phone [phone] 12/21/2017 9:44 AM

## 2017-12-22 ENCOUNTER — Ambulatory Visit (HOSPITAL_COMMUNITY)
Admission: RE | Admit: 2017-12-22 | Discharge: 2017-12-22 | Disposition: A | Discharge status: Home / Self Care | Payer: Medicare Other | Source: Ambulatory Visit | Attending: Orthopedic Surgery | Admitting: Orthopedic Surgery

## 2017-12-22 ENCOUNTER — Other Ambulatory Visit: Payer: Self-pay

## 2017-12-22 ENCOUNTER — Ambulatory Visit (HOSPITAL_COMMUNITY): Payer: Medicare Other

## 2017-12-22 ENCOUNTER — Encounter (HOSPITAL_COMMUNITY)
Admission: RE | Disposition: A | Discharge status: Home / Self Care | Payer: Self-pay | Source: Ambulatory Visit | Attending: Orthopedic Surgery

## 2017-12-22 ENCOUNTER — Ambulatory Visit (HOSPITAL_COMMUNITY): Payer: Medicare Other | Admitting: Anesthesiology

## 2017-12-22 ENCOUNTER — Encounter (HOSPITAL_COMMUNITY): Payer: Self-pay

## 2017-12-22 ENCOUNTER — Ambulatory Visit (HOSPITAL_COMMUNITY): Payer: Medicare Other | Admitting: Physician Assistant

## 2017-12-22 DIAGNOSIS — E7800 Pure hypercholesterolemia, unspecified: Secondary | ICD-10-CM | POA: Insufficient documentation

## 2017-12-22 DIAGNOSIS — Z87891 Personal history of nicotine dependence: Secondary | ICD-10-CM | POA: Insufficient documentation

## 2017-12-22 DIAGNOSIS — M4856XA Collapsed vertebra, not elsewhere classified, lumbar region, initial encounter for fracture: Secondary | ICD-10-CM | POA: Insufficient documentation

## 2017-12-22 DIAGNOSIS — I10 Essential (primary) hypertension: Secondary | ICD-10-CM | POA: Insufficient documentation

## 2017-12-22 DIAGNOSIS — Z79899 Other long term (current) drug therapy: Secondary | ICD-10-CM | POA: Insufficient documentation

## 2017-12-22 DIAGNOSIS — Z419 Encounter for procedure for purposes other than remedying health state, unspecified: Secondary | ICD-10-CM

## 2017-12-22 DIAGNOSIS — Z85828 Personal history of other malignant neoplasm of skin: Secondary | ICD-10-CM | POA: Insufficient documentation

## 2017-12-22 DIAGNOSIS — S32020G Wedge compression fracture of second lumbar vertebra, subsequent encounter for fracture with delayed healing: Secondary | ICD-10-CM

## 2017-12-22 DIAGNOSIS — F419 Anxiety disorder, unspecified: Secondary | ICD-10-CM | POA: Insufficient documentation

## 2017-12-22 DIAGNOSIS — K449 Diaphragmatic hernia without obstruction or gangrene: Secondary | ICD-10-CM | POA: Insufficient documentation

## 2017-12-22 HISTORY — PX: KYPHOPLASTY: SHX5884

## 2017-12-22 LAB — URINALYSIS, ROUTINE W REFLEX MICROSCOPIC
BILIRUBIN URINE: NEGATIVE
Glucose, UA: NEGATIVE mg/dL
Hgb urine dipstick: NEGATIVE
Ketones, ur: 5 mg/dL — AB
Nitrite: NEGATIVE
PROTEIN: NEGATIVE mg/dL
Specific Gravity, Urine: 1.011 (ref 1.005–1.030)
pH: 5 (ref 5.0–8.0)

## 2017-12-22 SURGERY — KYPHOPLASTY
Anesthesia: General

## 2017-12-22 MED ORDER — FENTANYL CITRATE (PF) 100 MCG/2ML IJ SOLN
INTRAMUSCULAR | Status: AC
  Filled 2017-12-22: qty 2

## 2017-12-22 MED ORDER — ONDANSETRON HCL 4 MG/2ML IJ SOLN: 4.0000 mg | Freq: Once | INTRAMUSCULAR | Status: DC | PRN

## 2017-12-22 MED ORDER — ONDANSETRON HCL 4 MG/2ML IJ SOLN
INTRAMUSCULAR | Status: AC
  Filled 2017-12-22: qty 2

## 2017-12-22 MED ORDER — LACTATED RINGERS IV SOLN
INTRAVENOUS | Status: DC
  Administered 2017-12-22 (×2): via INTRAVENOUS

## 2017-12-22 MED ORDER — BUPIVACAINE-EPINEPHRINE (PF) 0.25% -1:200000 IJ SOLN
INTRAMUSCULAR | Status: AC
  Filled 2017-12-22: qty 30

## 2017-12-22 MED ORDER — FENTANYL CITRATE (PF) 250 MCG/5ML IJ SOLN
INTRAMUSCULAR | Status: AC
  Filled 2017-12-22: qty 5

## 2017-12-22 MED ORDER — LIDOCAINE 2% (20 MG/ML) 5 ML SYRINGE
INTRAMUSCULAR | Status: DC | PRN
  Administered 2017-12-22: 100 mg via INTRAVENOUS

## 2017-12-22 MED ORDER — CEFAZOLIN SODIUM-DEXTROSE 2-4 GM/100ML-% IV SOLN
INTRAVENOUS | Status: AC
  Filled 2017-12-22: qty 100

## 2017-12-22 MED ORDER — PHENYLEPHRINE 40 MCG/ML (10ML) SYRINGE FOR IV PUSH (FOR BLOOD PRESSURE SUPPORT)
PREFILLED_SYRINGE | INTRAVENOUS | Status: AC
  Filled 2017-12-22: qty 10

## 2017-12-22 MED ORDER — LIDOCAINE 2% (20 MG/ML) 5 ML SYRINGE
INTRAMUSCULAR | Status: AC
  Filled 2017-12-22: qty 5

## 2017-12-22 MED ORDER — DEXAMETHASONE SODIUM PHOSPHATE 10 MG/ML IJ SOLN
INTRAMUSCULAR | Status: DC | PRN
  Administered 2017-12-22: 10 mg via INTRAVENOUS

## 2017-12-22 MED ORDER — BACITRACIN ZINC 500 UNIT/GM EX OINT
TOPICAL_OINTMENT | CUTANEOUS | Status: AC
  Filled 2017-12-22: qty 28.35

## 2017-12-22 MED ORDER — SUGAMMADEX SODIUM 200 MG/2ML IV SOLN
INTRAVENOUS | Status: DC | PRN
  Administered 2017-12-22: 100 mg via INTRAVENOUS

## 2017-12-22 MED ORDER — IOPAMIDOL (ISOVUE-300) INJECTION 61%
INTRAVENOUS | Status: AC
  Filled 2017-12-22: qty 50

## 2017-12-22 MED ORDER — ACETAMINOPHEN 500 MG PO TABS
500.0000 mg | ORAL_TABLET | Freq: Once | ORAL | Status: AC
  Administered 2017-12-22: 500 mg via ORAL

## 2017-12-22 MED ORDER — PROPOFOL 10 MG/ML IV BOLUS
INTRAVENOUS | Status: DC | PRN
  Administered 2017-12-22: 90 mg via INTRAVENOUS

## 2017-12-22 MED ORDER — ROCURONIUM BROMIDE 50 MG/5ML IV SOSY
PREFILLED_SYRINGE | INTRAVENOUS | Status: AC
  Filled 2017-12-22: qty 5

## 2017-12-22 MED ORDER — ACETAMINOPHEN 10 MG/ML IV SOLN: 700.0000 mg | Freq: Once | INTRAVENOUS | Status: DC | PRN

## 2017-12-22 MED ORDER — PHENYLEPHRINE 40 MCG/ML (10ML) SYRINGE FOR IV PUSH (FOR BLOOD PRESSURE SUPPORT)
PREFILLED_SYRINGE | INTRAVENOUS | Status: DC | PRN
  Administered 2017-12-22: 200 ug via INTRAVENOUS
  Administered 2017-12-22 (×2): 120 ug via INTRAVENOUS
  Administered 2017-12-22: 160 ug via INTRAVENOUS

## 2017-12-22 MED ORDER — IOPAMIDOL (ISOVUE-300) INJECTION 61%
INTRAVENOUS | Status: DC | PRN
  Administered 2017-12-22: 50 mL

## 2017-12-22 MED ORDER — BACITRACIN ZINC 500 UNIT/GM EX OINT
TOPICAL_OINTMENT | CUTANEOUS | Status: DC | PRN
  Administered 2017-12-22: 1 via TOPICAL

## 2017-12-22 MED ORDER — ONDANSETRON HCL 4 MG/2ML IJ SOLN
INTRAMUSCULAR | Status: DC | PRN
  Administered 2017-12-22: 4 mg via INTRAVENOUS

## 2017-12-22 MED ORDER — ESMOLOL HCL 100 MG/10ML IV SOLN
INTRAVENOUS | Status: DC | PRN
  Administered 2017-12-22: 20 mg via INTRAVENOUS

## 2017-12-22 MED ORDER — PROPOFOL 10 MG/ML IV BOLUS
INTRAVENOUS | Status: AC
  Filled 2017-12-22: qty 20

## 2017-12-22 MED ORDER — CEFAZOLIN SODIUM-DEXTROSE 2-4 GM/100ML-% IV SOLN
2.0000 g | INTRAVENOUS | Status: AC
  Administered 2017-12-22: 2 g via INTRAVENOUS

## 2017-12-22 MED ORDER — FENTANYL CITRATE (PF) 100 MCG/2ML IJ SOLN
INTRAMUSCULAR | Status: DC | PRN
  Administered 2017-12-22: 25 ug via INTRAVENOUS
  Administered 2017-12-22: 50 ug via INTRAVENOUS
  Administered 2017-12-22: 75 ug via INTRAVENOUS

## 2017-12-22 MED ORDER — PHENYLEPHRINE 40 MCG/ML (10ML) SYRINGE FOR IV PUSH (FOR BLOOD PRESSURE SUPPORT)
PREFILLED_SYRINGE | INTRAVENOUS | Status: AC
  Filled 2017-12-22: qty 20

## 2017-12-22 MED ORDER — 0.9 % SODIUM CHLORIDE (POUR BTL) OPTIME
TOPICAL | Status: DC | PRN
  Administered 2017-12-22: 1000 mL

## 2017-12-22 MED ORDER — FENTANYL CITRATE (PF) 100 MCG/2ML IJ SOLN
25.0000 ug | INTRAMUSCULAR | Status: DC | PRN
  Administered 2017-12-22: 25 ug via INTRAVENOUS

## 2017-12-22 MED ORDER — DEXAMETHASONE SODIUM PHOSPHATE 10 MG/ML IJ SOLN
INTRAMUSCULAR | Status: AC
  Filled 2017-12-22: qty 1

## 2017-12-22 MED ORDER — BUPIVACAINE-EPINEPHRINE (PF) 0.25% -1:200000 IJ SOLN
INTRAMUSCULAR | Status: DC | PRN
  Administered 2017-12-22: 7 mL via PERINEURAL

## 2017-12-22 MED ORDER — ROCURONIUM BROMIDE 50 MG/5ML IV SOSY
PREFILLED_SYRINGE | INTRAVENOUS | Status: DC | PRN
  Administered 2017-12-22: 30 mg via INTRAVENOUS

## 2017-12-22 MED ORDER — ACETAMINOPHEN 500 MG PO TABS
ORAL_TABLET | ORAL | Status: AC
  Administered 2017-12-22: 500 mg via ORAL
  Filled 2017-12-22: qty 1

## 2017-12-22 MED ORDER — POVIDONE-IODINE 7.5 % EX SOLN
Freq: Once | CUTANEOUS | Status: DC
  Filled 2017-12-22: qty 118

## 2017-12-22 SURGICAL SUPPLY — 45 items
BLADE SURG 15 STRL LF DISP TIS (BLADE) ×1 IMPLANT
BLADE SURG 15 STRL SS (BLADE) ×1
CEMENT KYPHON C01A KIT/MIXER (Cement) ×2 IMPLANT
COVER MAYO STAND STRL (DRAPES) ×2 IMPLANT
COVER SURGICAL LIGHT HANDLE (MISCELLANEOUS) IMPLANT
COVER WAND RF STERILE (DRAPES) IMPLANT
CURETTE EXPRESS SZ2 7MM (INSTRUMENTS) ×1 IMPLANT
CURRETTE EXPRESS SZ2 7MM (INSTRUMENTS) ×2
DECANTER SPIKE VIAL GLASS SM (MISCELLANEOUS) ×2 IMPLANT
DRAPE C-ARM 42X72 X-RAY (DRAPES) ×2 IMPLANT
DRAPE HALF SHEET 40X57 (DRAPES) IMPLANT
DRAPE INCISE IOBAN 66X45 STRL (DRAPES) ×2 IMPLANT
DRAPE LAPAROTOMY T 102X78X121 (DRAPES) ×2 IMPLANT
DRAPE SURG 17X23 STRL (DRAPES) ×8 IMPLANT
DRAPE WARM FLUID 44X44 (DRAPE) ×2 IMPLANT
DURAPREP 26ML APPLICATOR (WOUND CARE) ×2 IMPLANT
GAUZE 4X4 16PLY RFD (DISPOSABLE) ×2 IMPLANT
GAUZE SPONGE 4X4 12PLY STRL (GAUZE/BANDAGES/DRESSINGS) ×2 IMPLANT
GLOVE BIO SURGEON STRL SZ7 (GLOVE) ×2 IMPLANT
GLOVE BIO SURGEON STRL SZ8 (GLOVE) ×2 IMPLANT
GLOVE BIOGEL PI IND STRL 7.0 (GLOVE) ×1 IMPLANT
GLOVE BIOGEL PI IND STRL 8 (GLOVE) ×1 IMPLANT
GLOVE BIOGEL PI INDICATOR 7.0 (GLOVE) ×1
GLOVE BIOGEL PI INDICATOR 8 (GLOVE) ×1
GOWN STRL REUS W/ TWL LRG LVL3 (GOWN DISPOSABLE) ×2 IMPLANT
GOWN STRL REUS W/ TWL XL LVL3 (GOWN DISPOSABLE) ×1 IMPLANT
GOWN STRL REUS W/TWL LRG LVL3 (GOWN DISPOSABLE) ×2
GOWN STRL REUS W/TWL XL LVL3 (GOWN DISPOSABLE) ×1
KIT BASIN OR (CUSTOM PROCEDURE TRAY) ×2 IMPLANT
KIT TURNOVER KIT B (KITS) ×2 IMPLANT
NEEDLE 22X1 1/2 (OR ONLY) (NEEDLE) IMPLANT
NEEDLE HYPO 25X1 1.5 SAFETY (NEEDLE) ×2 IMPLANT
NEEDLE SPNL 18GX3.5 QUINCKE PK (NEEDLE) ×4 IMPLANT
NS IRRIG 1000ML POUR BTL (IV SOLUTION) ×2 IMPLANT
PACK UNIVERSAL I (CUSTOM PROCEDURE TRAY) ×2 IMPLANT
PAD ARMBOARD 7.5X6 YLW CONV (MISCELLANEOUS) ×4 IMPLANT
POSITIONER HEAD PRONE TRACH (MISCELLANEOUS) ×2 IMPLANT
SUT MNCRL AB 4-0 PS2 18 (SUTURE) ×2 IMPLANT
SYR BULB IRRIGATION 50ML (SYRINGE) ×2 IMPLANT
SYR CONTROL 10ML LL (SYRINGE) ×2 IMPLANT
TAPE CLOTH SURG 6X10 WHT LF (GAUZE/BANDAGES/DRESSINGS) ×2 IMPLANT
TOWEL OR 17X24 6PK STRL BLUE (TOWEL DISPOSABLE) ×2 IMPLANT
TOWEL OR 17X26 10 PK STRL BLUE (TOWEL DISPOSABLE) ×2 IMPLANT
TRAY KYPHOPAK 15/3 ONESTEP 1ST (MISCELLANEOUS) IMPLANT
TRAY KYPHOPAK 20/3 ONESTEP 1ST (MISCELLANEOUS) ×2 IMPLANT

## 2017-12-22 NOTE — Progress Notes (Signed)
Noted on discharge that patient's prescriptions were written with the wrong last name.  Notified Dr. Yevette Edwardsumonski.  He will discuss with his office and have correct prescriptions written and ready for patient to pick up on the way home.  Patient and husband are agreeable to this plan.

## 2017-12-22 NOTE — Op Note (Signed)
NAME:  Brenda [name]              MEDICAL RECORD NO.:  [id number]  PHYSICIAN:  Estill BambergMark Denzal Meir, MD           DATE OF BIRTH: March 25, 1940  DATE OF PROCEDURE:  12/22/2017                              OPERATIVE REPORT   PREOPERATIVE DIAGNOSIS:  L2 compression fracture.  POSTOPERATIVE DIAGNOSIS:  L2 compression fracture.  PROCEDURE:  L2 kyphoplasty.  SURGEON:  Estill BambergMark Arian Mcquitty, MD.  ASSISTANJason Coop:  Kayla McKenzie, PA-C.  ANESTHESIA:  General endotracheal anesthesia.  COMPLICATIONS:  None.  DISPOSITION:  Stable.  ESTIMATED BLOOD LOSS:  Minimal.  INDICATIONS FOR SURGERY:  Briefly, [name] is a very pleasant 77- year-old female, who did have an onset of pain in her low back for about 1 month.   Her pain was rather severe.  The patient's imaging studies did clearly reveal an L2 compression fracture. We did attempt a trial of nonoperative treatment, but the patient continued to feel rather debilitated as a result of her ongoing pain.  Given her ongoing pain and dysfunction, we did discuss proceeding with the procedure noted above.  I did fully discuss the procedure with the patient, and she did wish to proceed.  OPERATIVE DETAILS:  On 12/22/2017, the patient was brought to surgery and general endotracheal anesthesia was administered.  The patient was placed prone on a well-padded flat Jackson bed with gel rolls placed under the patient's chest and hips.  Antibiotics were given.  AP and lateral fluoroscopy was brought into the field.  The L2 pedicles were marked out in the usual fashion.  After a time-out procedure was performed, I did advance Jamshidis across the L2 pedicles on the right and left sides.  I then drilled through the Jamshidis.  I then inserted kyphoplasty balloons and I was able to inflate the balloons with approximately 5cc of contrast.  Partial restoration of the superior endplate was noted.  At this point, after the cement was mixed, a total of approximately 5-6cc of cement  was injected, half on the right, and half on the left.  Excellent interdigitation of cement was identified.  There was noted to be interdigitation of cement into the fractured upper endplate as well.  There was no abnormal extravasation of cement identified.  The cement was then allowed to [facility]arden, after which point the Jamshidis were removed.  The wound  was then irrigated and closed using 4-0 Monocryl.  Bacitracin and a sterile dressing were applied.   The patient was then awoken from general endotracheal anesthesia and transferred to recovery in stable condition.     Estill BambergMark Sophee Mckimmy, MD

## 2017-12-22 NOTE — H&P (Signed)
PREOPERATIVE H&P  Chief Complaint: Low back pain  HPI: Brenda [name] is a 77 y.o. female who presents with ongoing pain in the low back  MRI and xrays reveal an L2 compression fracture  Patient has failed multiple forms of conservative care and continues to have pain (see office notes for additional details regarding the patient's full course of treatment)  Past Medical History:  Diagnosis Date  . Anxiety   . Chronic diarrhea   . Coarse tremors   . Colon polyp   . DDD (degenerative disc disease), cervical   . Hiatal hernia   . History of skin cancer   . Hypercholesteremia   . Hypertension   . Migraines   . Osteoporosis   . Shortness of breath dyspnea    with exertion  . Tremor   . Weight loss    Past Surgical History:  Procedure Laterality Date  . ESOPHAGEAL MANOMETRY N/A 01/15/2015   Procedure: ESOPHAGEAL MANOMETRY (EM);  Surgeon: Graylin Shiver, MD;  Location: [facility];  Service: Endoscopy;  Laterality: N/A;  . HIATAL HERNIA REPAIR    . RHINOPLASTY    . SINUS SURGERY WITH INSTATRAK     Social History   Socioeconomic History  . Marital status: Married    Spouse name: Not on file  . Number of children: 0  . Years of education: MA  . Highest education level: Not on file  Occupational History  . Occupation: Retired  Engineer, production  . Financial resource strain: Not on file  . Food insecurity:    Worry: Not on file    Inability: Not on file  . Transportation needs:    Medical: Not on file    Non-medical: Not on file  Tobacco Use  . Smoking status: Former Smoker    Last attempt to quit: 09/11/1988    Years since quitting: 29.2  . Smokeless tobacco: Never Used  Substance and Sexual Activity  . Alcohol use: Yes    Alcohol/week: 0.0 standard drinks    Comment: OCCASIONAL  . Drug use: No  . Sexual activity: Not on file  Lifestyle  . Physical activity:    Days per week: Not on file    Minutes per session: Not on file  . Stress: Not on file    Relationships  . Social connections:    Talks on phone: Not on file    Gets together: Not on file    Attends religious service: Not on file    Active member of club or organization: Not on file    Attends meetings of clubs or organizations: Not on file    Relationship status: Not on file  Other Topics Concern  . Not on file  Social History Narrative   Denies any caffeine use    Family History  Problem Relation Age of Onset  . Lung cancer Mother   . COPD Father   . Hypertension Brother    Allergies  Allergen Reactions  . Diclofenac Other (See Comments)    Stomach burning  . Macrodantin [Nitrofurantoin Macrocrystal] Itching, Nausea And Vomiting, Swelling and Rash    Tongue swelling  . Tramadol Other (See Comments)    Stomach burning  . Codeine Nausea And Vomiting  . Topamax [Topiramate] Other (See Comments)    Loss of taste, fatigue   Prior to Admission medications   Medication Sig Start Date End Date Taking? Authorizing Provider  ALPRAZolam Prudy Feeler) 0.25 MG tablet Take 0.25 mg by mouth daily  as needed (tremors).  01/20/17  Yes [provider]  atorvastatin (LIPITOR) 20 MG tablet Take 20 mg by mouth every Monday, Wednesday, and Friday.    Yes [provider]  budesonide (ENTOCORT EC) 3 MG 24 hr capsule Take 9 mg by mouth daily before breakfast.   Yes [provider]  butalbital-acetaminophen-caffeine (FIORICET, ESGIC) 50-325-40 MG tablet Take 1 tablet by mouth every 4 (four) hours as needed for migraine (tension headaches).  07/09/15  Yes [provider]  Calcium-Magnesium-Vitamin D [phone] MG-MG-UNIT TABS Take 1 tablet by mouth every morning.   Yes [provider]  citalopram (CELEXA) 10 MG tablet Take 10 mg by mouth daily. 03/31/17  Yes [provider]  HYDROcodone-acetaminophen (NORCO/VICODIN) 5-325 MG tablet Take 1 tablet by mouth every 6 (six) hours as needed for moderate pain.   Yes [provider]  lisinopril  (PRINIVIL,ZESTRIL) 10 MG tablet Take 10 mg by mouth daily. 08/24/17  Yes [provider]  metoprolol (LOPRESSOR) 50 MG tablet Take 50 mg by mouth 2 (two) times daily.   Yes [provider]  Multiple Vitamins-Minerals (ICAPS AREDS 2 PO) Take 1 tablet by mouth 2 (two) times daily.    Yes [provider]  Multiple Vitamins-Minerals (MULTIVITAMIN WITH MINERALS) tablet Take 1 tablet by mouth every morning.    Yes [provider]  oxyCODONE-acetaminophen (PERCOCET/ROXICET) 5-325 MG tablet Take 1 tablet by mouth every 8 (eight) hours as needed for severe pain.   Yes [provider]  primidone (MYSOLINE) 50 MG tablet Take 3 pills in AM, 2 midday, and 3 pills at night. Patient taking differently: Take 100-150 mg by mouth See admin instructions. Take 150 mg by mouth in morning, 100 mg in the afternoon, and 150 mg at night. 04/06/17  Yes Huston FoleyAthar, Saima, MD  promethazine (PHENERGAN) 25 MG tablet Take 25 mg by mouth.  07/09/15  Yes [provider]  tiZANidine (ZANAFLEX) 2 MG tablet Take 2 mg by mouth every 8 (eight) hours as needed for muscle spasms.   Yes [provider]  verapamil (CALAN-SR) 240 MG CR tablet Take 240 mg by mouth daily.  02/25/15  Yes [provider]  VIBERZI 100 MG TABS Take 100 mg by mouth 2 (two) times daily with a meal.  03/20/16  Yes [provider]  sucralfate (CARAFATE) 1 g tablet Take 1 tablet (1 g total) by mouth 4 (four) times daily as needed. Patient not taking: Reported on 12/14/2017 09/09/15   Maretta BeesFornage, Louis, MD     All other systems have been reviewed and were otherwise negative with the exception of those mentioned in the HPI and as above.  Physical Exam: There were no vitals filed for this visit.  There is no height or weight on file to calculate BMI.  General: Alert, no acute distress Cardiovascular: No pedal edema Respiratory: No cyanosis, no use of accessory musculature Skin: No lesions in the area  of chief complaint Neurologic: Sensation intact distally Psychiatric: Patient is competent for consent with normal mood and affect Lymphatic: No axillary or cervical lymphadenopathy  MUSCULOSKELETAL: + TTP low back  Assessment/Plan: LUMBAR 2 COMPRESSION FRACTURE RESULTING IN SEVERE ONGOING PAIN Plan for Procedure(s): LUMBAR 2 KYPHOPLASTY   Jackelyn HoehnMark L Karlyn Glasco, MD 12/22/2017 7:18 AM

## 2017-12-22 NOTE — Transfer of Care (Signed)
Immediate Anesthesia Transfer of Care Note  Patient: Brenda [name]  Procedure(s) Performed: LUMBAR TWO KYPHOPLASTY (N/A )  Patient Location: PACU  Anesthesia Type:General  Level of Consciousness: drowsy and patient cooperative  Airway & Oxygen Therapy: Patient Spontanous Breathing  Post-op Assessment: Report given to RN and Post -op Vital signs reviewed and stable  Post vital signs: Reviewed and stable  Last Vitals:  Vitals Value Taken Time  BP 197/161 12/22/2017  1:39 PM  Temp    Pulse 98 12/22/2017  1:40 PM  Resp 14 12/22/2017  1:40 PM  SpO2 87 % 12/22/2017  1:40 PM  Vitals shown include unvalidated device data.  Last Pain:  Vitals:   12/22/17 0855  TempSrc:   PainSc: 6       Patients Stated Pain Goal: 2 (12/22/17 0855)  Complications: No apparent anesthesia complications

## 2017-12-22 NOTE — Anesthesia Postprocedure Evaluation (Signed)
Anesthesia Post Note  Patient: Brenda SheehanBonnie W [name]  Procedure(s) Performed: LUMBAR TWO KYPHOPLASTY (N/A )     Patient location during evaluation: PACU Anesthesia Type: General Level of consciousness: awake and alert Pain management: pain level controlled Vital Signs Assessment: post-procedure vital signs reviewed and stable Respiratory status: spontaneous breathing, nonlabored ventilation, respiratory function stable and patient connected to nasal cannula oxygen Cardiovascular status: blood pressure returned to baseline and stable Postop Assessment: no apparent nausea or vomiting Anesthetic complications: no    Last Vitals:  Vitals:   12/22/17 1430 12/22/17 1450  BP: (!) 162/79 (!) 164/78  Pulse: 86   Resp: 18 18  Temp: (!) 36.1 C   SpO2: 94% 96%    Last Pain:  Vitals:   12/22/17 1450  TempSrc:   PainSc: 3                  Trevor IhaStephen A Willett Lefeber

## 2017-12-22 NOTE — Anesthesia Procedure Notes (Signed)
Procedure Name: Intubation Date/Time: 12/22/2017 12:16 PM Performed by: Rosiland OzMeyers, Cloteal Isaacson, CRNA Pre-anesthesia Checklist: Patient identified, Emergency Drugs available, Suction available, Patient being monitored and Timeout performed Patient Re-evaluated:Patient Re-evaluated prior to induction Oxygen Delivery Method: Circle system utilized Preoxygenation: Pre-oxygenation with 100% oxygen Induction Type: IV induction Ventilation: Mask ventilation without difficulty Laryngoscope Size: Miller and 2 Grade View: Grade I Tube type: Oral Tube size: 7.0 mm Number of attempts: 1 Airway Equipment and Method: Stylet Placement Confirmation: ETT inserted through vocal cords under direct vision,  positive ETCO2 and breath sounds checked- equal and bilateral Secured at: 21 cm Tube secured with: Tape Dental Injury: Teeth and Oropharynx as per pre-operative assessment

## 2017-12-23 ENCOUNTER — Encounter (HOSPITAL_COMMUNITY): Payer: Self-pay | Admitting: Orthopedic Surgery

## 2018-03-18 ENCOUNTER — Emergency Department (HOSPITAL_COMMUNITY)
Admission: EM | Admit: 2018-03-18 | Discharge: 2018-03-19 | Disposition: A | Discharge status: Home / Self Care | Payer: Medicare Other | Attending: Emergency Medicine | Admitting: Emergency Medicine

## 2018-03-18 ENCOUNTER — Encounter (HOSPITAL_COMMUNITY): Payer: Self-pay | Admitting: *Deleted

## 2018-03-18 ENCOUNTER — Other Ambulatory Visit: Payer: Self-pay

## 2018-03-18 DIAGNOSIS — Z79899 Other long term (current) drug therapy: Secondary | ICD-10-CM | POA: Diagnosis not present

## 2018-03-18 DIAGNOSIS — R55 Syncope and collapse: Secondary | ICD-10-CM | POA: Diagnosis not present

## 2018-03-18 DIAGNOSIS — Z87891 Personal history of nicotine dependence: Secondary | ICD-10-CM | POA: Insufficient documentation

## 2018-03-18 DIAGNOSIS — I10 Essential (primary) hypertension: Secondary | ICD-10-CM | POA: Insufficient documentation

## 2018-03-18 LAB — BASIC METABOLIC PANEL
Anion gap: 13 (ref 5–15)
BUN: <5 mg/dL — ABNORMAL LOW (ref 8–23)
CO2: 18 mmol/L — ABNORMAL LOW (ref 22–32)
Calcium: 8.1 mg/dL — ABNORMAL LOW (ref 8.9–10.3)
Chloride: 105 mmol/L (ref 98–111)
Creatinine, Ser: 0.83 mg/dL (ref 0.44–1.00)
GFR calc Af Amer: >60 mL/min (ref >60)
GFR calc non Af Amer: >60 mL/min (ref >60)
GLUCOSE: 156 mg/dL — AB (ref 70–99)
Potassium: 3.3 mmol/L — ABNORMAL LOW (ref 3.5–5.1)
Sodium: 136 mmol/L (ref 135–145)

## 2018-03-18 LAB — CBC
HCT: 39 % (ref 36.0–46.0)
Hemoglobin: 12.6 g/dL (ref 12.0–15.0)
MCH: 32.5 pg (ref 26.0–34.0)
MCHC: 32.3 g/dL (ref 30.0–36.0)
MCV: 100.5 fL — AB (ref 80.0–100.0)
Platelets: 159 10*3/uL (ref 150–400)
RBC: 3.88 MIL/uL (ref 3.87–5.11)
RDW: 13.2 % (ref 11.5–15.5)
WBC: 3.8 10*3/uL — ABNORMAL LOW (ref 4.0–10.5)
nRBC: 0 % (ref 0.0–0.2)

## 2018-03-18 LAB — ETHANOL: Alcohol, Ethyl (B): <10 mg/dL (ref <10)

## 2018-03-18 LAB — CBG MONITORING, ED: Glucose-Capillary: 127 mg/dL — ABNORMAL HIGH (ref 70–99)

## 2018-03-18 MED ORDER — POTASSIUM CHLORIDE CRYS ER 20 MEQ PO TBCR
20.0000 meq | EXTENDED_RELEASE_TABLET | Freq: Once | ORAL | Status: AC
  Administered 2018-03-18: 20 meq via ORAL
  Filled 2018-03-18: qty 1

## 2018-03-18 MED ORDER — SODIUM CHLORIDE 0.9% FLUSH
3.0000 mL | Freq: Once | INTRAVENOUS | Status: AC
  Administered 2018-03-18: 3 mL via INTRAVENOUS

## 2018-03-18 MED ORDER — SODIUM CHLORIDE 0.9 % IV BOLUS
1000.0000 mL | Freq: Once | INTRAVENOUS | Status: AC
  Administered 2018-03-18: 1000 mL via INTRAVENOUS

## 2018-03-18 NOTE — Discharge Instructions (Addendum)
You were evaluated in the emergency department for a near fainting spell after standing up at the restaurant.  Your blood work showed that your potassium was mildly low.  Your symptoms improved while you were here.  It will be important for you to stay well-hydrated.  Please follow-up with your doctor as your blood pressure medications may still need to be adjusted.  Please return if any worsening symptoms.

## 2018-03-18 NOTE — ED Provider Notes (Signed)
[facility] EMERGENCY DEPARTMENT Provider Note   CSN: [id number] Arrival date & time: 03/18/18  2101    History   Chief Complaint Chief Complaint  Patient presents with  . Loss of Consciousness    HPI Brenda [name] is a 78 y.o. female.  She is brought in by EMS after a presyncopal events at a restaurant.  She is chronically frail and does not have much of an appetite.  She said she did not eat anything today and her husband was taking out to a restaurant.  She needed to go use the bathroom so he had to assist her and as she stood up and started walking she became less responsive and fainted.  She was assisted to the ground and she said she could continue to hear people the entire time and does not think she fully lost consciousness.  She said she was recently in the doctor's office and they took away 1 of her blood pressure medicines because she is been running low blood pressures.  Her husband said she is had a syncopal event before.     The history is provided by the patient and the spouse.  Loss of Consciousness  Episode history:  Single Most recent episode:  Today Progression:  Resolved Chronicity:  Recurrent Context: standing up   Witnessed: yes   Relieved by:  Certain positions Worsened by:  Nothing Ineffective treatments:  None tried Associated symptoms: malaise/fatigue and nausea   Associated symptoms: no chest pain, no diaphoresis, no difficulty breathing, no fever, no focal weakness, no headaches, no seizures, no shortness of breath and no vomiting     Past Medical History:  Diagnosis Date  . Anxiety   . Chronic diarrhea   . Coarse tremors   . Colon polyp   . DDD (degenerative disc disease), cervical   . Hiatal hernia   . History of skin cancer   . Hypercholesteremia   . Hypertension   . Migraines   . Osteoporosis   . Shortness of breath dyspnea    with exertion  . Tremor   . Weight loss     Patient Active Problem List   Diagnosis  Date Noted  . Paraesophageal hiatal hernia s/p repair w biologic mesh 03/26/2015 03/27/2015  . GERD (gastroesophageal reflux disease) s/p Nissen fundoplication 03/26/2015 03/26/2015    Past Surgical History:  Procedure Laterality Date  . ESOPHAGEAL MANOMETRY N/A 01/15/2015   Procedure: ESOPHAGEAL MANOMETRY (EM);  Surgeon: Graylin Shiver, MD;  Location: [facility] ENDOSCOPY;  Service: Endoscopy;  Laterality: N/A;  . HIATAL HERNIA REPAIR    . KYPHOPLASTY N/A 12/22/2017   Procedure: LUMBAR TWO KYPHOPLASTY;  Surgeon: Estill Bamberg, MD;  Location: [facility] OR;  Service: Orthopedics;  Laterality: N/A;  LUMBAR TWO KYPHOPLASTY  . RHINOPLASTY    . SINUS SURGERY WITH INSTATRAK       OB History   No obstetric history on file.      Home Medications    Prior to Admission medications   Medication Sig Start Date End Date Taking? Authorizing Provider  ALPRAZolam Prudy Feeler) 0.25 MG tablet Take 0.25 mg by mouth daily as needed (tremors).  01/20/17   [provider]  atorvastatin (LIPITOR) 20 MG tablet Take 20 mg by mouth every Monday, Wednesday, and Friday.     [provider]  budesonide (ENTOCORT EC) 3 MG 24 hr capsule Take 9 mg by mouth daily before breakfast.    [provider]  butalbital-acetaminophen-caffeine (FIORICET, ESGIC) 50-325-40 MG  tablet Take 1 tablet by mouth every 4 (four) hours as needed for migraine (tension headaches).  07/09/15   [provider]  Calcium-Magnesium-Vitamin D [phone] MG-MG-UNIT TABS Take 1 tablet by mouth every morning.    [provider]  citalopram (CELEXA) 10 MG tablet Take 10 mg by mouth daily. 03/31/17   [provider]  HYDROcodone-acetaminophen (NORCO/VICODIN) 5-325 MG tablet Take 1 tablet by mouth every 6 (six) hours as needed for moderate pain.    [provider]  lisinopril (PRINIVIL,ZESTRIL) 10 MG tablet Take 10 mg by mouth daily. 08/24/17   [provider]  metoprolol (LOPRESSOR) 50 MG tablet Take 50 mg  by mouth 2 (two) times daily.    [provider]  Multiple Vitamins-Minerals (ICAPS AREDS 2 PO) Take 1 tablet by mouth 2 (two) times daily.     [provider]  Multiple Vitamins-Minerals (MULTIVITAMIN WITH MINERALS) tablet Take 1 tablet by mouth every morning.     [provider]  oxyCODONE-acetaminophen (PERCOCET/ROXICET) 5-325 MG tablet Take 1 tablet by mouth every 8 (eight) hours as needed for severe pain.    [provider]  primidone (MYSOLINE) 50 MG tablet Take 3 pills in AM, 2 midday, and 3 pills at night. Patient taking differently: Take 100-150 mg by mouth See admin instructions. Take 150 mg by mouth in morning, 100 mg in the afternoon, and 150 mg at night. 04/06/17   Huston Foley, MD  promethazine (PHENERGAN) 25 MG tablet Take 25 mg by mouth.  07/09/15   [provider]  sucralfate (CARAFATE) 1 g tablet Take 1 tablet (1 g total) by mouth 4 (four) times daily as needed. Patient not taking: Reported on 12/14/2017 09/09/15   Maretta Bees, MD  verapamil (CALAN-SR) 240 MG CR tablet Take 240 mg by mouth daily.  02/25/15   [provider]  VIBERZI 100 MG TABS Take 100 mg by mouth 2 (two) times daily with a meal.  03/20/16   [provider]    Family History Family History  Problem Relation Age of Onset  . Lung cancer Mother   . COPD Father   . Hypertension Brother     Social History Social History   Tobacco Use  . Smoking status: Former Smoker    Last attempt to quit: 09/11/1988    Years since quitting: 29.5  . Smokeless tobacco: Never Used  Substance Use Topics  . Alcohol use: Yes    Alcohol/week: 0.0 standard drinks    Comment: OCCASIONAL  . Drug use: No     Allergies   Diclofenac; Macrodantin [nitrofurantoin macrocrystal]; Tramadol; Codeine; and Topamax [topiramate]   Review of Systems Review of Systems  Constitutional: Positive for activity change, appetite change and malaise/fatigue. Negative for diaphoresis  and fever.  HENT: Negative for sore throat.   Eyes: Negative for visual disturbance.  Respiratory: Negative for shortness of breath.   Cardiovascular: Positive for syncope. Negative for chest pain.  Gastrointestinal: Positive for nausea. Negative for abdominal pain and vomiting.  Genitourinary: Negative for dysuria.  Musculoskeletal: Positive for back pain (surgery, getting PT).  Skin: Negative for rash.  Neurological: Positive for light-headedness. Negative for focal weakness, seizures and headaches.     Physical Exam Updated Vital Signs BP (!) 129/48 (BP Location: Right Arm)   Pulse 83   Temp (!) 97.5 F (36.4 C) (Oral)   Resp (!) 21   SpO2 100%   Physical Exam Vitals signs and nursing note reviewed.  Constitutional:  General: She is not in acute distress.    Appearance: She is well-developed.  HENT:     Head: Normocephalic and atraumatic.  Eyes:     Conjunctiva/sclera: Conjunctivae normal.  Neck:     Musculoskeletal: Neck supple.  Cardiovascular:     Rate and Rhythm: Normal rate and regular rhythm.     Pulses: Normal pulses.     Heart sounds: No murmur.  Pulmonary:     Effort: Pulmonary effort is normal. No respiratory distress.     Breath sounds: Normal breath sounds.  Abdominal:     Palpations: Abdomen is soft.     Tenderness: There is no abdominal tenderness.  Musculoskeletal:        General: No tenderness or signs of injury.     Right lower leg: Edema present.     Left lower leg: Edema (pedal 2+ symmetric) present.  Skin:    General: Skin is warm and dry.  Neurological:     General: No focal deficit present.     Mental Status: She is alert. Mental status is at baseline.     Sensory: No sensory deficit.     Motor: No weakness.      ED Treatments / Results  Labs (all labs ordered are listed, but only abnormal results are displayed) Labs Reviewed  BASIC METABOLIC PANEL - Abnormal; Notable for the following components:      Result Value    Potassium 3.3 (*)    CO2 18 (*)    Glucose, Bld 156 (*)    BUN <5 (*)    Calcium 8.1 (*)    All other components within normal limits  CBC - Abnormal; Notable for the following components:   WBC 3.8 (*)    MCV 100.5 (*)    All other components within normal limits  CBG MONITORING, ED - Abnormal; Notable for the following components:   Glucose-Capillary 127 (*)    All other components within normal limits  ETHANOL    EKG None  Radiology No results found.  Procedures Procedures (including critical care time)  Medications Ordered in ED Medications  sodium chloride flush (NS) 0.9 % injection 3 mL (3 mLs Intravenous Given 03/18/18 2248)  sodium chloride 0.9 % bolus 1,000 mL (0 mLs Intravenous Stopped 03/18/18 2329)  potassium chloride SA (K-DUR,KLOR-CON) CR tablet 20 mEq (20 mEq Oral Given 03/18/18 2248)     Initial Impression / Assessment and Plan / ED Course  I have reviewed the triage vital signs and the nursing notes.  Pertinent labs & imaging results that were available during my care of the patient were reviewed by me and considered in my medical decision making (see chart for details).  Clinical Course as of Mar 19 1407  Sat Mar 18, 2018  2133 Triage nurse mentioned daily drinking per EMS.  The patient and her husband did not mention anything like that to me.  She said she was having a Pepsi at the dinner table.   [MB]  2219 Patient's alcohol level is 0 here.  She does say she drinks to help with her tremors.  Was not drinking tonight.   [MB]    Clinical Course User Index [MB] Terrilee Files, MD   Patient feels improved. Wants to go home. Husband will keep an eye on her. Will return if any problems.      Final Clinical Impressions(s) / ED Diagnoses   Final diagnoses:  Near syncope    ED Discharge Orders  None       Terrilee Files, MD 03/19/18 [phone]

## 2018-03-18 NOTE — ED Triage Notes (Signed)
Pt was at a restaurant, got up to use the restroom, had a witnessed syncopal episode and was assisted to floor by family. ETOH on board. Initially hypotensive, 60 palpated; has received NS, bp increased to 103/44, cbg 175. Per husband pt is a daily drinker and has had recent changes to bp meds

## 2018-03-18 NOTE — ED Notes (Signed)
Patient discharged from facility. Patient and family verbalized understanding of discharge instructions. Patient wheeled out by significant other.

## 2018-06-01 ENCOUNTER — Other Ambulatory Visit: Payer: Self-pay | Admitting: Neurology

## 2018-06-01 DIAGNOSIS — G250 Essential tremor: Secondary | ICD-10-CM

## 2018-06-19 ENCOUNTER — Telehealth: Payer: Self-pay | Admitting: *Deleted

## 2018-06-19 NOTE — Telephone Encounter (Signed)
acalled pt. Due to current COVID 19 pandemic, our office is severely reducing in office visits until further notice, in order to minimize the risk to our patients and healthcare providers. Unable to get in contact with the patient to convert their office visit with [facility] on 06/22/2018 into a doxy.me visit. I left a voicemail asking the patient to return my call. Office number was provided.     If patient calls back please convert their office visit into a doxy.me visit.

## 2018-06-21 NOTE — Telephone Encounter (Signed)
Pt returned call and provided consent for video visit , she states she has an iphone 5 and she thinks she may can proceed, she provided consent for insurance to be billed also  [email or web address] confirmation received

## 2018-06-21 NOTE — Telephone Encounter (Signed)
Noted  

## 2018-06-22 ENCOUNTER — Ambulatory Visit (INDEPENDENT_AMBULATORY_CARE_PROVIDER_SITE_OTHER): Payer: Medicare Other | Admitting: Adult Health

## 2018-06-22 ENCOUNTER — Other Ambulatory Visit: Payer: Self-pay

## 2018-06-22 ENCOUNTER — Encounter: Payer: Self-pay | Admitting: Adult Health

## 2018-06-22 DIAGNOSIS — G250 Essential tremor: Secondary | ICD-10-CM | POA: Diagnosis not present

## 2018-06-22 MED ORDER — PRIMIDONE 50 MG PO TABS: ORAL_TABLET | ORAL | 3 refills | Status: DC

## 2018-06-22 NOTE — Progress Notes (Signed)
  [facility] [address] [place]. [place] [address] [phone]     Virtual Visit via Telephone Note  I connected with Brenda [name] on 06/22/18 at  9:00 AM EDT by telephone located remotely at [facility] and verified that I am speaking with the correct person using two identifiers who reports being located at home.    Visit scheduled by Butch Penny. I discussed the limitations, risks, security and privacy concerns of performing an evaluation and management service by telephone and the availability of in person appointments. I also discussed with the patient that there may be a patient responsible charge related to this service. The patient expressed understanding and agreed to proceed. See telephone note for consent and additional scheduling information.    History of Present Illness:  Brenda [name] is a 78 y.o. female who has been followed in this office for essential tremor.  She was initially scheduled for face-to-face office follow up visit today time but due to COVID19, visit rescheduled for non-face-to-face telephone visit with patients consent. Unable to participate in video visit due to lack of access to device with camera.    Brenda [name] is a 78 year old female with a history of essential tremor.  She reports that she increased her Mysoline to 4 tablets in the morning, 2 tablets after lunch and 4 tablets at bedtime.  She states that since the increase her tremor has been stable.  She is able to complete most ADLs independently.  She states that she did have a fall while in her kitchen and "crushed 2 vertebrae's" in her back.  She is in physical therapy and is now using a walker.  She states that she does have trouble with her handwriting due to her tremor.  She continues on Xanax prescribed by her PCP.  She denies any new issues.   Observations/Objective:  Generalized: Well developed, in no acute distress   Neurological examination   Mentation: Alert oriented to time, place, history taking. Follows all commands speech and language fluent  Assessment and Plan:  1.  Essential tremor  The patient increase her Mysoline and is tolerating it better.  She will continue taking 4 tablets in the morning after lunch and 4 tablets in the evening.  We will check blood work at the next visit.  She is advised that if her symptoms worsen or she develops new symptoms she should let us know.  She will follow-up in 6 months or sooner if needed.  Follow Up Instructions:   FU 6 months    I discussed the assessment and treatment plan with the patient.  The patient was provided an opportunity to ask questions and all were answered to their satisfaction. The patient agreed with the plan and verbalized an understanding of the instructions.   I provided 15 minutes of non-face-to-face time during this encounter.    Butch Penny NP-C  [facility] [address] [place], [place] [address]  Phone [phone] Fax [phone]

## 2018-06-23 NOTE — Progress Notes (Signed)
I have reviewed and agreed above plan. 

## 2018-12-27 ENCOUNTER — Other Ambulatory Visit: Payer: Self-pay

## 2018-12-27 ENCOUNTER — Encounter: Payer: Self-pay | Admitting: Adult Health

## 2018-12-27 ENCOUNTER — Ambulatory Visit: Payer: Medicare Other | Admitting: Adult Health

## 2018-12-27 VITALS — BP 127/76 | HR 81 | Temp 97.4°F | Ht 63.0 in | Wt 105.6 lb

## 2018-12-27 DIAGNOSIS — G250 Essential tremor: Secondary | ICD-10-CM

## 2018-12-27 NOTE — Progress Notes (Signed)
I have read the note, and I agree with the clinical assessment and plan.  [name] K Willis   

## 2018-12-27 NOTE — Progress Notes (Signed)
PATIENT: Brenda [name] DOB: 25-Aug-1940  REASON FOR VISIT: follow up HISTORY FROM: patient  HISTORY OF PRESENT ILLNESS: Today 12/27/18:  [name] is a [age] year old female with a history of essential tremor.  She returns today for follow-up.  She continues on Mysoline 4 tablets in the morning, 2 tablets after lunch and 4 tablets at bedtime.  She states that her tremor has gotten slightly worse.  Tremor primarily affects the hands.  She states that she has trouble with her handwriting.  She has to use a spoon when eating.  She is tearful about her tremor.  The patient had surgery at the beginning of the year after suffering a fall.  She reports that she has not been using her walker consistently.  She states that her house is not conducive to a walker.  She did not bring her walker with her to today's visit.  She returns today for an evaluation.  HISTORY [name] is a 78 year old female with a history of essential tremor.  She reports that she increased her Mysoline to 4 tablets in the morning, 2 tablets after lunch and 4 tablets at bedtime.  She states that since the increase her tremor has been stable.  She is able to complete most ADLs independently.  She states that she did have a fall while in her kitchen and "crushed 2 vertebrae's" in her back.  She is in physical therapy and is now using a walker.  She states that she does have trouble with her handwriting due to her tremor.  She continues on Xanax prescribed by her PCP.  She denies any new issues.  REVIEW OF SYSTEMS: Out of a complete 14 system review of symptoms, the patient complains only of the following symptoms, and all other reviewed systems are negative.  See HPI  ALLERGIES: Allergies  Allergen Reactions  . Diclofenac Other (See Comments)    Stomach burning  . Macrodantin [Nitrofurantoin Macrocrystal] Itching, Nausea And Vomiting, Swelling and Rash    Tongue swelling  . Tramadol Other (See Comments)    Stomach burning  .  Codeine Nausea And Vomiting  . Topamax [Topiramate] Other (See Comments)    Loss of taste, fatigue    HOME MEDICATIONS: Outpatient Medications Prior to Visit  Medication Sig Dispense Refill  . ALPRAZolam (XANAX) 0.25 MG tablet Take 0.25 mg by mouth daily as needed (tremors).   0  . atorvastatin (LIPITOR) 20 MG tablet Take 20 mg by mouth every Monday, Wednesday, and Friday.     . budesonide (ENTOCORT EC) 3 MG 24 hr capsule Take 9 mg by mouth daily before breakfast.    . butalbital-acetaminophen-caffeine (FIORICET, ESGIC) 50-325-40 MG tablet Take 1 tablet by mouth every 4 (four) hours as needed (tension headache).   0  . citalopram (CELEXA) 10 MG tablet Take 10 mg by mouth daily as needed (depression).   12  . Eluxadoline (VIBERZI) 100 MG TABS Take 100 mg by mouth 2 (two) times daily.    Marland Kitchen. lisinopril (PRINIVIL,ZESTRIL) 10 MG tablet Take 10 mg by mouth daily.  12  . metoprolol (LOPRESSOR) 50 MG tablet Take 50 mg by mouth 2 (two) times daily.    . Multiple Vitamins-Minerals (ICAPS AREDS 2 PO) Take 1 capsule by mouth 2 (two) times daily.     . Multiple Vitamins-Minerals (MULTIVITAMIN WITH MINERALS) tablet Take 1 tablet by mouth daily.     . primidone (MYSOLINE) 50 MG tablet TAKE 4 TABLETS IN THE MORNING, AND  2 TABLETS IN THE AFTERNOON AND 4 TABLETS AT NIGHT 900 tablet 3  . sucralfate (CARAFATE) 1 g tablet Take 1 tablet (1 g total) by mouth 4 (four) times daily as needed. (Patient not taking: Reported on 12/14/2017) 30 tablet 0  . verapamil (CALAN-SR) 240 MG CR tablet Take 240 mg by mouth daily.   11  . HYDROcodone-acetaminophen (NORCO/VICODIN) 5-325 MG tablet Take 1 tablet by mouth every 6 (six) hours as needed for severe pain.      No facility-administered medications prior to visit.     PAST MEDICAL HISTORY: Past Medical History:  Diagnosis Date  . Anxiety   . Chronic diarrhea   . Coarse tremors   . Colon polyp   . DDD (degenerative disc disease), cervical   . Hiatal hernia   .  History of skin cancer   . Hypercholesteremia   . Hypertension   . Migraines   . Osteoporosis   . Shortness of breath dyspnea    with exertion  . Tremor   . Weight loss     PAST SURGICAL HISTORY: Past Surgical History:  Procedure Laterality Date  . ESOPHAGEAL MANOMETRY N/A 01/15/2015   Procedure: ESOPHAGEAL MANOMETRY (EM);  Surgeon: Wonda Horner, MD;  Location: [facility];  Service: Endoscopy;  Laterality: N/A;  . HIATAL HERNIA REPAIR    . KYPHOPLASTY N/A 12/22/2017   Procedure: LUMBAR TWO KYPHOPLASTY;  Surgeon: Phylliss Bob, MD;  Location: [facility];  Service: Orthopedics;  Laterality: N/A;  LUMBAR TWO KYPHOPLASTY  . RHINOPLASTY    . SINUS SURGERY WITH INSTATRAK      FAMILY HISTORY: Family History  Problem Relation Age of Onset  . Lung cancer Mother   . COPD Father   . Hypertension Brother     SOCIAL HISTORY: Social History   Socioeconomic History  . Marital status: Married    Spouse name: Not on file  . Number of children: 0  . Years of education: MA  . Highest education level: Not on file  Occupational History  . Occupation: Retired  Scientific laboratory technician  . Financial resource strain: Not on file  . Food insecurity    Worry: Not on file    Inability: Not on file  . Transportation needs    Medical: Not on file    Non-medical: Not on file  Tobacco Use  . Smoking status: Former Smoker    Quit date: 09/11/1988    Years since quitting: 30.3  . Smokeless tobacco: Never Used  Substance and Sexual Activity  . Alcohol use: Yes    Alcohol/week: 0.0 standard drinks    Comment: OCCASIONAL  . Drug use: No  . Sexual activity: Not on file  Lifestyle  . Physical activity    Days per week: Not on file    Minutes per session: Not on file  . Stress: Not on file  Relationships  . Social Herbalist on phone: Not on file    Gets together: Not on file    Attends religious service: Not on file    Active member of club or organization: Not on file    Attends meetings  of clubs or organizations: Not on file    Relationship status: Not on file  . Intimate partner violence    Fear of current or ex partner: Not on file    Emotionally abused: Not on file    Physically abused: Not on file    Forced sexual activity: Not on file  Other  Topics Concern  . Not on file  Social History Narrative   Denies any caffeine use       PHYSICAL EXAM  Vitals:   12/27/18 1342  Height: 5\' 3"  (1.6 m)   Body mass index is 19.22 kg/m.  Generalized: Well developed, in no acute distress   Neurological examination  Mentation: Alert oriented to time, place, history taking. Follows all commands speech and language fluent Cranial nerve II-XII: Extraocular movements were full, visual field were full on confrontational test. Facial sensation and strength were normal. Uvula tongue midline. Head turning and shoulder shrug  were normal and symmetric. Motor: The motor testing reveals 5 over 5 strength of all 4 extremities. Good symmetric motor tone is noted throughout.  Sensory: Sensory testing is intact to soft touch on all 4 extremities. No evidence of extinction is noted.   Coordination: Cerebellar testing reveals good finger-nose-finger and heel-to-shin bilaterally. Tremor noted in the upper extremities.  Slight tremor noted in the head. Gait and station: Gait is unsteady.  Tandem gait not attempted.   DIAGNOSTIC DATA (LABS, IMAGING, TESTING) - I reviewed patient records, labs, notes, testing and imaging myself where available.  Lab Results  Component Value Date   WBC 3.8 (L) 03/18/2018   HGB 12.6 03/18/2018   HCT 39.0 03/18/2018   MCV 100.5 (H) 03/18/2018   PLT 159 03/18/2018      Component Value Date/Time   NA 136 03/18/2018 2110   K 3.3 (L) 03/18/2018 2110   CL 105 03/18/2018 2110   CO2 18 (L) 03/18/2018 2110   GLUCOSE 156 (H) 03/18/2018 2110   BUN <5 (L) 03/18/2018 2110   CREATININE 0.83 03/18/2018 2110   CALCIUM 8.1 (L) 03/18/2018 2110   PROT 7.7  12/20/2017 1521   ALBUMIN 3.2 (L) 12/20/2017 1521   AST 120 (H) 12/20/2017 1521   ALT 65 (H) 12/20/2017 1521   ALKPHOS 268 (H) 12/20/2017 1521   BILITOT 1.3 (H) 12/20/2017 1521   GFRNONAA >60 03/18/2018 2110   GFRAA >60 03/18/2018 2110   No results found for: CHOL, HDL, LDLCALC, LDLDIRECT, TRIG, CHOLHDL Lab Results  Component Value Date   HGBA1C 6.3 (H) 03/27/2015   No results found for: VITAMINB12 No results found for: TSH    ASSESSMENT AND PLAN 78 y.o. year old female  has a past medical history of Anxiety, Chronic diarrhea, Coarse tremors, Colon polyp, DDD (degenerative disc disease), cervical, Hiatal hernia, History of skin cancer, Hypercholesteremia, Hypertension, Migraines, Osteoporosis, Shortness of breath dyspnea, Tremor, and Weight loss. here with:  1.  Essential tremor  -Continue Mysoline 4 tablets in the morning, 2 tablets at lunch and 4 tablets at bedtime -I will check a primidone level today -Encouraged patient to use her walker at all times  Advised if her symptoms worsen or she develops new symptoms she should let 73 know.  She will follow-up in 6 months or sooner if needed.   I spent 15 minutes with the patient. 50% of this time was spent reviewing plan of care   [place], MSN, NP-C 12/27/2018, 1:48 PM [facility] [address], [address] [place], [place] [phone]

## 2018-12-27 NOTE — Patient Instructions (Signed)
Your Plan:  Continue Mysoline Blood work today If your symptoms worsen or you develop new symptoms please let us know.   Thank you for coming to see [place] at [facility]. I hope we have been able to provide you high quality care today.  You may receive a patient satisfaction survey over the next few weeks. We would appreciate your feedback and comments so that we may continue to improve ourselves and the health of our patients.

## 2018-12-29 LAB — PRIMIDONE, SERUM
Phenobarbital, Serum: 36 ug/mL (ref 15–40)
Primidone Lvl: 23.9 ug/mL (ref 5.0–12.0)

## 2019-01-01 ENCOUNTER — Telehealth: Payer: Self-pay | Admitting: Adult Health

## 2019-01-01 DIAGNOSIS — G250 Essential tremor: Secondary | ICD-10-CM

## 2019-01-01 MED ORDER — PRIMIDONE 50 MG PO TABS: ORAL_TABLET | ORAL | 3 refills | Status: DC

## 2019-01-01 NOTE — Telephone Encounter (Signed)
I called the patient.  Advised that her primidone level was elevated.  She is currently taking primidone 50 mg 4 tablets in the morning, 2 tablets in the afternoon and 4 tablets at bedtime.  She will reduce her dose to 3 tablets in the morning, 2 tablets in the afternoon and 3 tablets at bedtime.  We will recheck levels in 3 to 4 weeks.

## 2019-01-20 IMAGING — CR DG CHEST 2V
2 series · 2 of 2 positions shown · non-contrast
Comparison: October 17, 2014

CLINICAL DATA: Syncope today

EXAM:
CHEST - 2 VIEW

[chest lat]
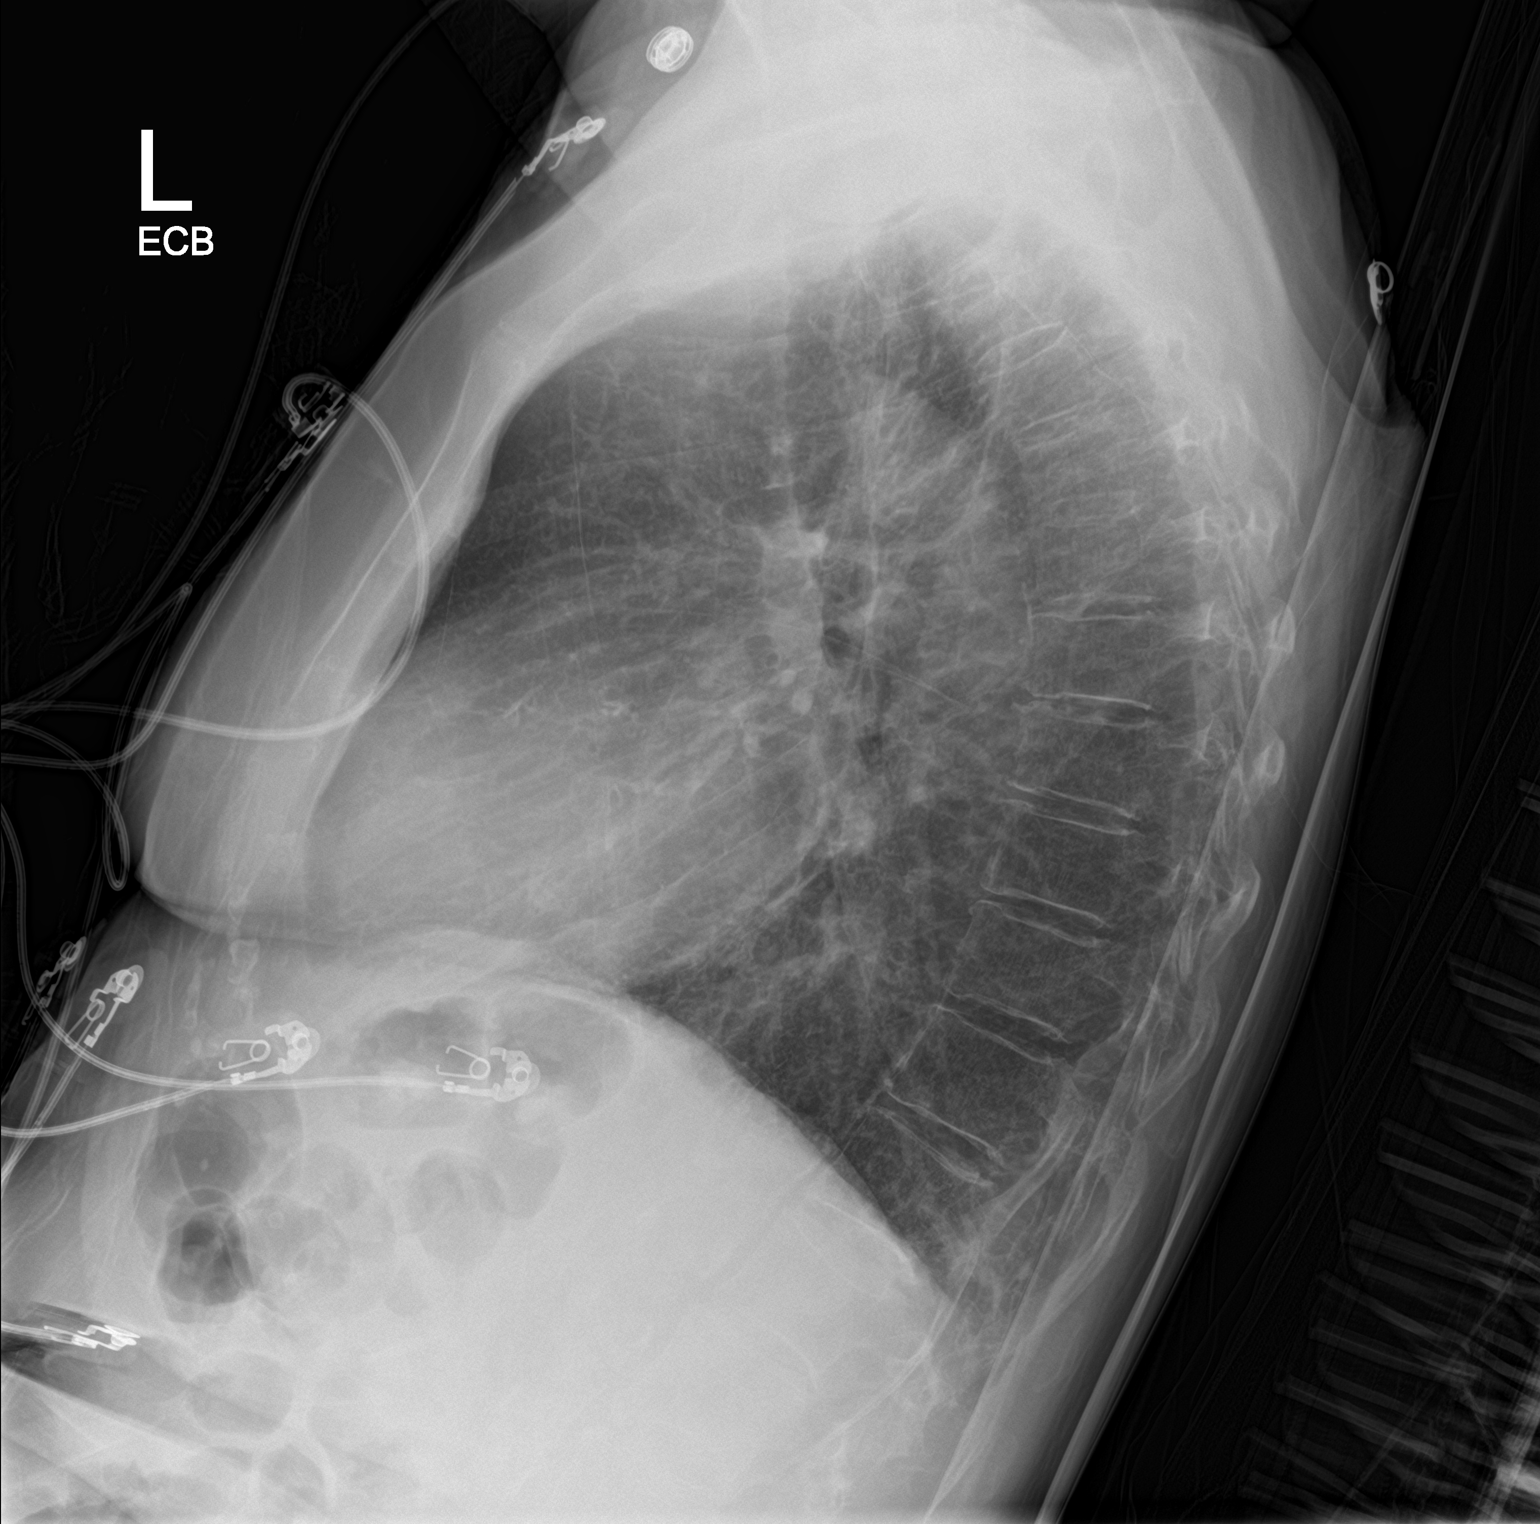

[chest ap]
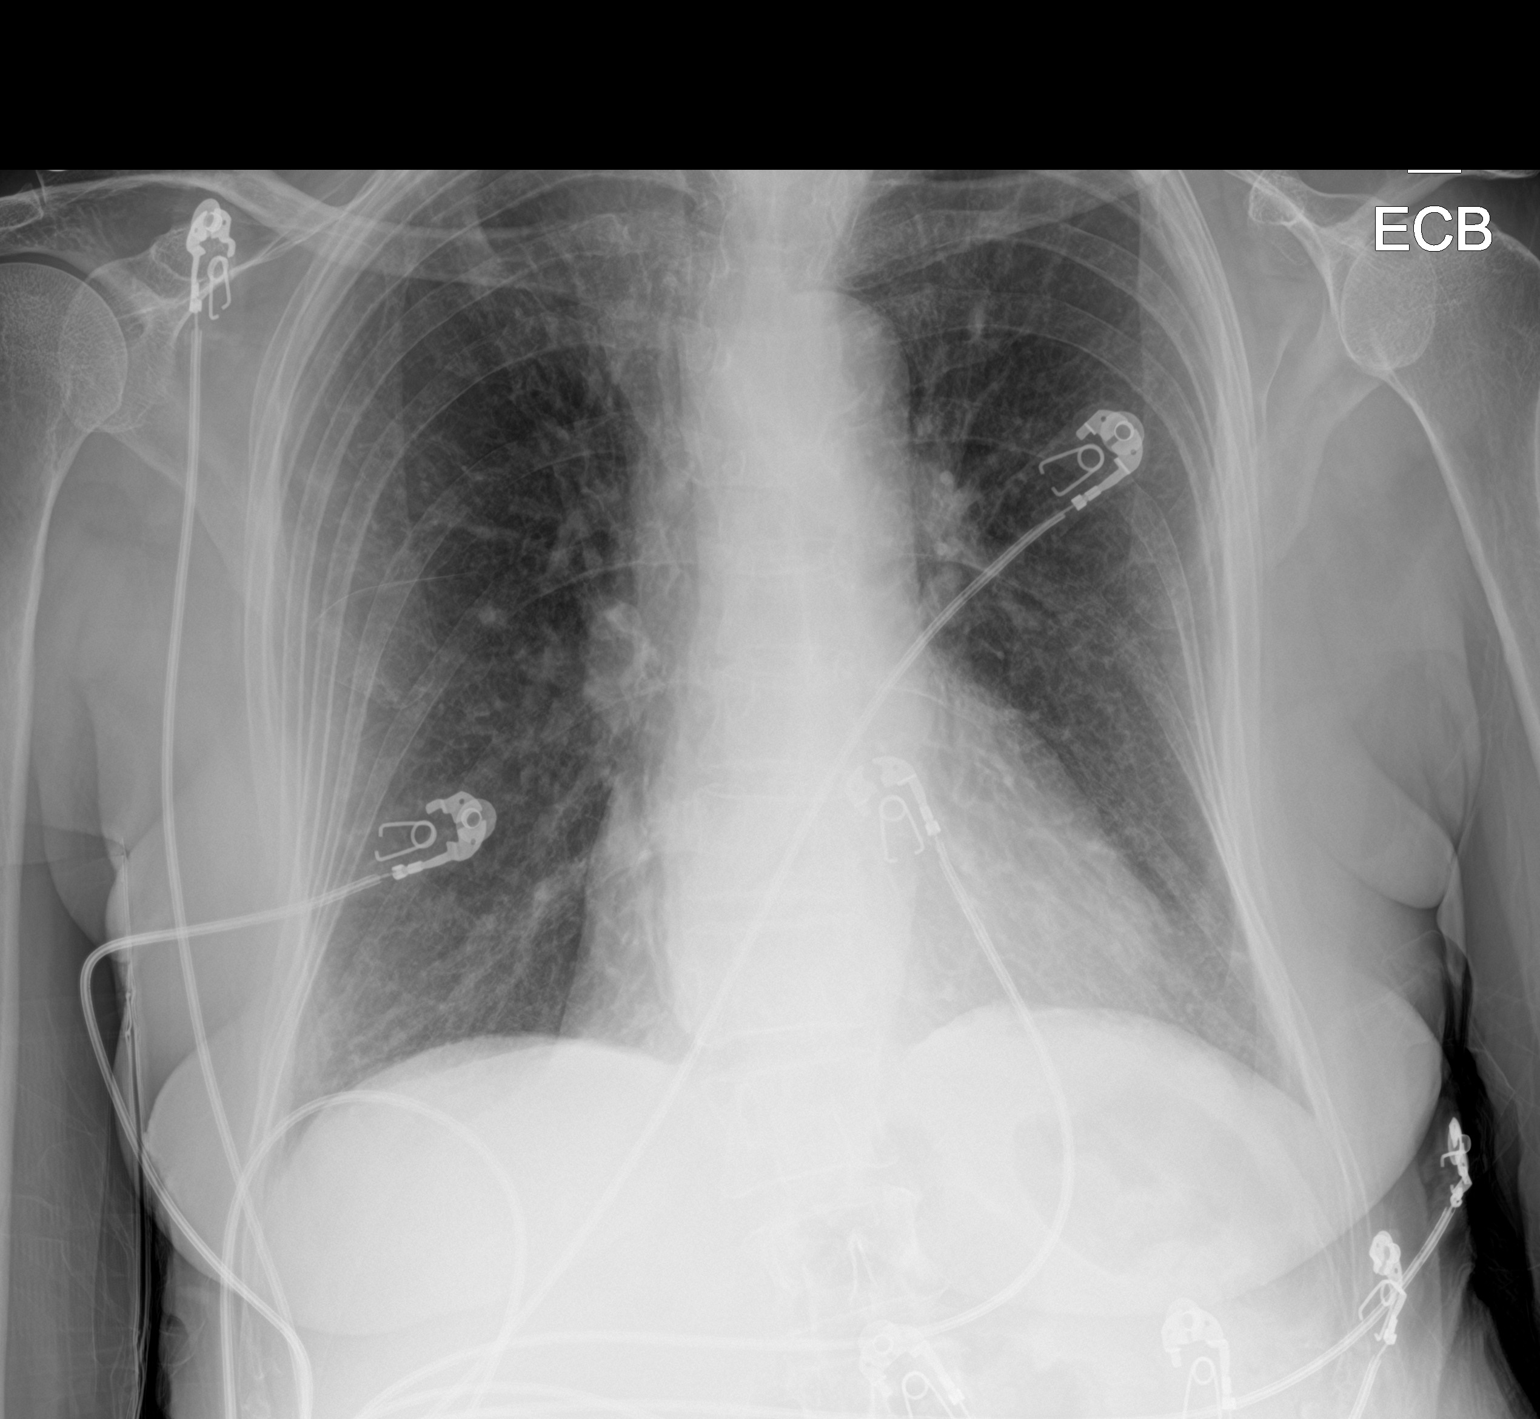

[2 of 2 positions shown; findings below may reference images not displayed]

FINDINGS: The heart size and mediastinal contours are stable. The heart size
is enlarged. The lungs are hyperinflated. There is no focal
infiltrate, pulmonary edema, or pleural effusion. The visualized
skeletal structures are unremarkable.
IMPRESSION: No active cardiopulmonary disease.  Hyperinflated lungs.

## 2019-02-06 ENCOUNTER — Ambulatory Visit: Payer: Medicare PPO | Attending: Internal Medicine

## 2019-02-06 DIAGNOSIS — Z23 Encounter for immunization: Secondary | ICD-10-CM | POA: Insufficient documentation

## 2019-02-06 NOTE — Progress Notes (Signed)
   Covid-19 Vaccination Clinic  Name:  ALAJIAH DUTKIEWICZ    MRN: [id number] DOB: 10-04-1940  02/06/2019  Ms. Scotti was observed post Covid-19 immunization for 15 minutes without incidence. She was provided with Vaccine Information Sheet and instruction to access the V-Safe system.   Ms. Etzkorn was instructed to call 911 with any severe reactions post vaccine: Marland Kitchen Difficulty breathing  . Swelling of your face and throat  . A fast heartbeat  . A bad rash all over your body  . Dizziness and weakness    Immunizations Administered    Name Date Dose VIS Date Route   Pfizer COVID-19 Vaccine 02/06/2019  4:55 PM 0.3 mL 12/29/2018 Intramuscular   Manufacturer: [employer], [employer]   Lot: [id number]   NDC: [address]-5

## 2019-02-21 ENCOUNTER — Telehealth: Payer: Self-pay | Admitting: Adult Health

## 2019-02-21 DIAGNOSIS — G250 Essential tremor: Secondary | ICD-10-CM

## 2019-02-21 NOTE — Telephone Encounter (Signed)
Please have patient come in for recheck of primidone level. Order has been placed.

## 2019-02-21 NOTE — Telephone Encounter (Signed)
-----   Message from Butch Penny, NP sent at 01/01/2019  2:39 PM EST ----- primidone

## 2019-02-22 NOTE — Telephone Encounter (Signed)
Patient has been scheduled for primidone recheck on 02-28-2019 at 1:30 pm. Patient is aware of appt date and time.

## 2019-02-26 ENCOUNTER — Ambulatory Visit: Payer: Medicare PPO | Attending: Internal Medicine

## 2019-02-26 DIAGNOSIS — Z23 Encounter for immunization: Secondary | ICD-10-CM | POA: Insufficient documentation

## 2019-02-26 NOTE — Progress Notes (Signed)
   Covid-19 Vaccination Clinic  Name:  Brenda [name]    MRN: [id number] DOB: Jun 07, 1940  02/26/2019  Brenda [name] was observed post Covid-19 immunization for 15 minutes without incidence. She was provided with Vaccine Information Sheet and instruction to access the V-Safe system.   Brenda [name] was instructed to call 911 with any severe reactions post vaccine: Marland Kitchen Difficulty breathing  . Swelling of your face and throat  . A fast heartbeat  . A bad rash all over your body  . Dizziness and weakness    Immunizations Administered    Name Date Dose VIS Date Route   Pfizer COVID-19 Vaccine 02/26/2019  8:42 AM 0.3 mL 12/29/2018 Intramuscular   Manufacturer: [employer], [employer]   Lot: [id number]   NDC: [address]-3

## 2019-02-28 ENCOUNTER — Other Ambulatory Visit: Payer: Self-pay

## 2019-03-20 NOTE — Telephone Encounter (Signed)
Pt called stating that she was not able to get her labs done and is wanting to know if she still has to come in and get them done. Please advise.

## 2019-03-21 NOTE — Telephone Encounter (Signed)
Spoke with the patient and she has been scheduled to have her primidone level rechecked at 04-02-2019 at 1:00 pm. Patient is aware of appt date and time.

## 2019-04-02 ENCOUNTER — Other Ambulatory Visit (INDEPENDENT_AMBULATORY_CARE_PROVIDER_SITE_OTHER): Payer: Self-pay

## 2019-04-02 DIAGNOSIS — Z0289 Encounter for other administrative examinations: Secondary | ICD-10-CM

## 2019-04-02 DIAGNOSIS — G250 Essential tremor: Secondary | ICD-10-CM

## 2019-04-03 LAB — PRIMIDONE, SERUM
Phenobarbital, Serum: 29 ug/mL (ref 15–40)
Primidone Lvl: 37.2 ug/mL (ref 5.0–12.0)

## 2019-04-04 ENCOUNTER — Telehealth: Payer: Self-pay | Admitting: Adult Health

## 2019-04-04 DIAGNOSIS — G250 Essential tremor: Secondary | ICD-10-CM

## 2019-04-04 MED ORDER — PRIMIDONE 50 MG PO TABS: 100.0000 mg | ORAL_TABLET | Freq: Three times a day (TID) | ORAL | 3 refills | Status: DC

## 2019-04-04 NOTE — Telephone Encounter (Signed)
I called pt and discussed this in an extended conversation with her. She will reduce the primidone level to 100mg  TID. She has the 50 mg tablets at home. She will return on 05/02/19 at 2:30 for a repeat primidone level. Pt verbalized understanding of results and recommendations. Pt reports that her gait is actually better.

## 2019-04-04 NOTE — Addendum Note (Signed)
Addended by: Geronimo Running A on: 04/04/2019 02:25 PM   Modules accepted: Orders

## 2019-04-04 NOTE — Telephone Encounter (Signed)
Primidone level remains elevated.  I consulted with Dr. Frances Furbish.  She agrees with reducing the dose.  Patient should currently have the 50 mg tablets please advise that she should reduce to 2 tablets 3 times a day. Recheck lab work in one month.  Please advise that high doses could lead to gait disturbance and falls.

## 2019-04-15 IMAGING — RF DG C-ARM 61-120 MIN
1 series · 1 of 1 positions shown · non-contrast
Comparison: Lumbosacral spine radiograph 11/28/2017, MRI of the
lumbosacral spine 12/02/2017

CLINICAL DATA: L2 vertebroplasty.  Post L2 vertebroplasty.

EXAM:
LUMBAR SPINE - 2-3 VIEW; DG C-ARM 61-120 MIN

[Series 1: run · 1 of 1 slices shown]
[im 1/1]
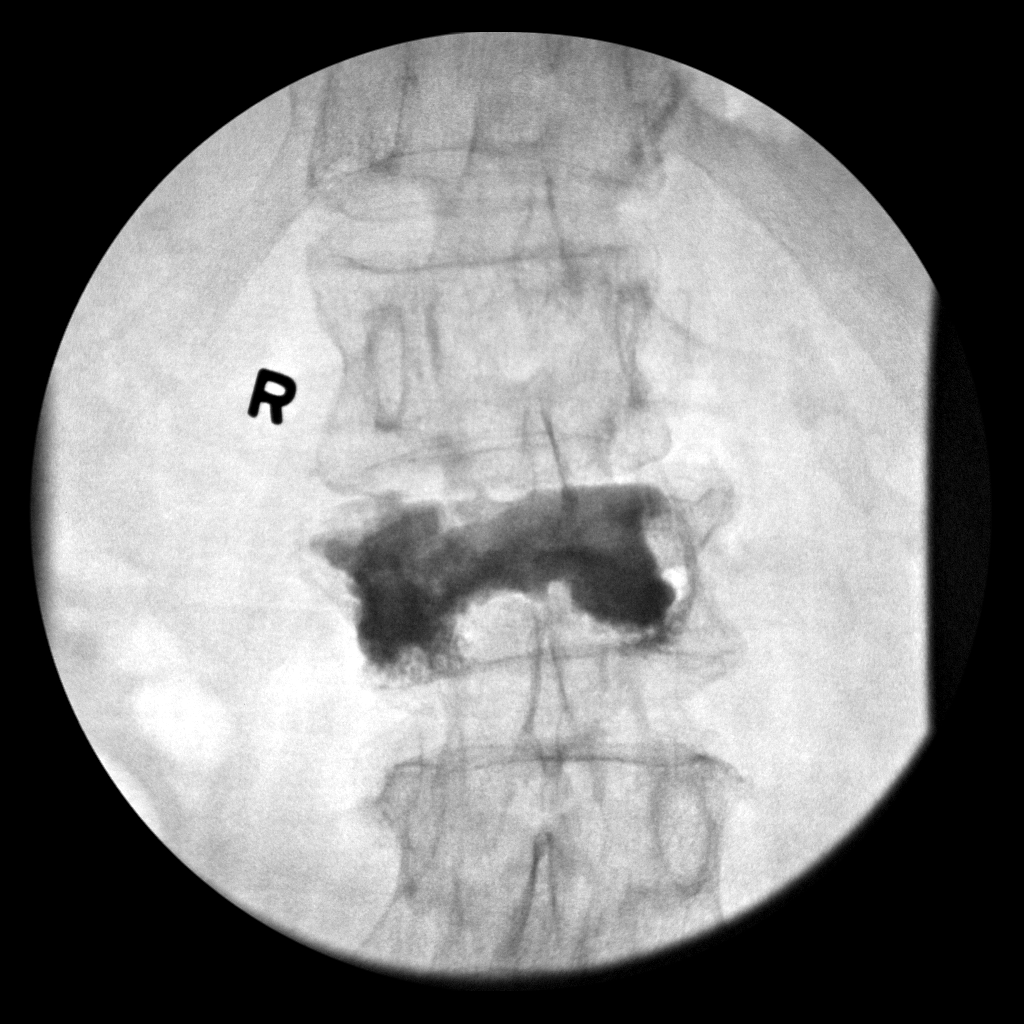

[1 of 1 positions shown; findings below may reference images not displayed]

FINDINGS: Post vertebroplasty of L2 vertebral body with improved height of the
vertebral body centrally. Normal alignment. No evidence of new
fractures.
IMPRESSION: Post L2 vertebroplasty without evidence of immediate complications.

Fluoroscopy time was recorded as 2 minutes 55 seconds.

## 2019-05-02 ENCOUNTER — Other Ambulatory Visit: Payer: Self-pay

## 2019-05-02 ENCOUNTER — Other Ambulatory Visit (INDEPENDENT_AMBULATORY_CARE_PROVIDER_SITE_OTHER): Payer: Self-pay

## 2019-05-02 DIAGNOSIS — Z0289 Encounter for other administrative examinations: Secondary | ICD-10-CM

## 2019-05-02 DIAGNOSIS — G250 Essential tremor: Secondary | ICD-10-CM

## 2019-05-04 LAB — PRIMIDONE, SERUM
Phenobarbital, Serum: 23 ug/mL (ref 15–40)
Primidone Lvl: 11.3 ug/mL (ref 5.0–12.0)

## 2019-05-07 ENCOUNTER — Telehealth: Payer: Self-pay | Admitting: *Deleted

## 2019-05-07 NOTE — Telephone Encounter (Signed)
Spoke with patient and informed her the Primidone level is in normal range. Patient verbalized understanding, appreciation.

## 2019-07-05 ENCOUNTER — Other Ambulatory Visit: Payer: Self-pay

## 2019-07-05 ENCOUNTER — Ambulatory Visit (INDEPENDENT_AMBULATORY_CARE_PROVIDER_SITE_OTHER): Payer: Medicare PPO | Admitting: Adult Health

## 2019-07-05 VITALS — BP 155/82 | HR 119 | Ht 61.0 in | Wt 109.0 lb

## 2019-07-05 DIAGNOSIS — G250 Essential tremor: Secondary | ICD-10-CM

## 2019-07-05 DIAGNOSIS — M25522 Pain in left elbow: Secondary | ICD-10-CM

## 2019-07-05 DIAGNOSIS — M25521 Pain in right elbow: Secondary | ICD-10-CM

## 2019-07-05 NOTE — Progress Notes (Addendum)
PATIENT: Brenda [name] DOB: [birth year]  REASON FOR VISIT: follow up HISTORY FROM: patient  HISTORY OF PRESENT ILLNESS: Today 07/05/19:  Brenda [name] is a 79 year old female with a history of essential tremor.  She returns today for follow-up.  She remains on Mysoline 100 mg 3 times a day.  She feels that this continues to offer her good benefit for her tremor.  She states that the tremor primarily affects her hands.  She on occasion has it in her legs.  She also reports that at least once a day she will get pain and and arm starting at the elbow she describes as an achy sensation.  Reports that it can occur in any arm.  Reports that it can occur during the day or at night.  She states that typically she can take Tylenol and it resolves this discomfort.  HISTORY 12/27/18:  Brenda [name] is a 79 year old female with a history of essential tremor.  She returns today for follow-up.  She continues on Mysoline 4 tablets in the morning, 2 tablets after lunch and 4 tablets at bedtime.  She states that her tremor has gotten slightly worse.  Tremor primarily affects the hands.  She states that she has trouble with her handwriting.  She has to use a spoon when eating.  She is tearful about her tremor.  The patient had surgery at the beginning of the year after suffering a fall.  She reports that she has not been using her walker consistently.  She states that her house is not conducive to a walker.  She did not bring her walker with her to today's visit.  She returns today for an evaluation.   REVIEW OF SYSTEMS: Out of a complete 14 system review of symptoms, the patient complains only of the following symptoms, and all other reviewed systems are negative.  ALLERGIES: Allergies  Allergen Reactions  . Diclofenac Other (See Comments)    Stomach burning  . Macrodantin [Nitrofurantoin Macrocrystal] Itching, Nausea And Vomiting, Swelling and Rash    Tongue swelling  . Tramadol Other (See Comments)     Stomach burning  . Codeine Nausea And Vomiting  . Topamax [Topiramate] Other (See Comments)    Loss of taste, fatigue    HOME MEDICATIONS: Outpatient Medications Prior to Visit  Medication Sig Dispense Refill  . ALPRAZolam (XANAX) 0.25 MG tablet Take 0.25 mg by mouth daily as needed (tremors).   0  . atorvastatin (LIPITOR) 20 MG tablet Take 20 mg by mouth every Monday, Wednesday, and Friday.     . budesonide (ENTOCORT EC) 3 MG 24 hr capsule Take 9 mg by mouth daily before breakfast.    . butalbital-acetaminophen-caffeine (FIORICET, ESGIC) 50-325-40 MG tablet Take 1 tablet by mouth every 4 (four) hours as needed (tension headache).   0  . citalopram (CELEXA) 10 MG tablet Take 10 mg by mouth daily as needed (depression).   12  . Eluxadoline (VIBERZI) 100 MG TABS Take 100 mg by mouth 2 (two) times daily.     Marland Kitchen lisinopril (PRINIVIL,ZESTRIL) 10 MG tablet Take 10 mg by mouth daily.  12  . metoprolol (LOPRESSOR) 50 MG tablet Take 50 mg by mouth 2 (two) times daily.    . Multiple Vitamins-Minerals (ICAPS AREDS 2 PO) Take 1 capsule by mouth 2 (two) times daily.     . Multiple Vitamins-Minerals (MULTIVITAMIN WITH MINERALS) tablet Take 1 tablet by mouth daily.     . primidone (MYSOLINE) 50 MG tablet  Take 2 tablets (100 mg total) by mouth 3 (three) times daily. 540 tablet 3  . sucralfate (CARAFATE) 1 g tablet Take 1 tablet (1 g total) by mouth 4 (four) times daily as needed. 30 tablet 0  . verapamil (CALAN-SR) 240 MG CR tablet Take 240 mg by mouth daily.   11   No facility-administered medications prior to visit.    PAST MEDICAL HISTORY: Past Medical History:  Diagnosis Date  . Anxiety   . Chronic diarrhea   . Coarse tremors   . Colon polyp   . DDD (degenerative disc disease), cervical   . Hiatal hernia   . History of skin cancer   . Hypercholesteremia   . Hypertension   . Migraines   . Osteoporosis   . Shortness of breath dyspnea    with exertion  . Tremor   . Weight loss     PAST  SURGICAL HISTORY: Past Surgical History:  Procedure Laterality Date  . ESOPHAGEAL MANOMETRY N/A 01/15/2015   Procedure: ESOPHAGEAL MANOMETRY (EM);  Surgeon: Graylin Shiver, MD;  Location: [facility];  Service: Endoscopy;  Laterality: N/A;  . HIATAL HERNIA REPAIR    . KYPHOPLASTY N/A 12/22/2017   Procedure: LUMBAR TWO KYPHOPLASTY;  Surgeon: Estill Bamberg, MD;  Location: [facility];  Service: Orthopedics;  Laterality: N/A;  LUMBAR TWO KYPHOPLASTY  . RHINOPLASTY    . SINUS SURGERY WITH INSTATRAK      FAMILY HISTORY: Family History  Problem Relation Age of Onset  . Lung cancer Mother   . COPD Father   . Hypertension Brother     SOCIAL HISTORY: Social History   Socioeconomic History  . Marital status: Married    Spouse name: Not on file  . Number of children: 0  . Years of education: MA  . Highest education level: Not on file  Occupational History  . Occupation: Retired  Tobacco Use  . Smoking status: Former Smoker    Quit date: 09/11/1988    Years since quitting: 30.8  . Smokeless tobacco: Never Used  Vaping Use  . Vaping Use: Never used  Substance and Sexual Activity  . Alcohol use: Yes    Alcohol/week: 0.0 standard drinks    Comment: OCCASIONAL  . Drug use: No  . Sexual activity: Not on file  Other Topics Concern  . Not on file  Social History Narrative   Denies any caffeine use    Social Determinants of Health   Financial Resource Strain:   . Difficulty of Paying Living Expenses:   Food Insecurity:   . Worried About Programme researcher, broadcasting/film/video in the Last Year:   . Barista in the Last Year:   Transportation Needs:   . Freight forwarder (Medical):   Marland Kitchen Lack of Transportation (Non-Medical):   Physical Activity:   . Days of Exercise per Week:   . Minutes of Exercise per Session:   Stress:   . Feeling of Stress :   Social Connections:   . Frequency of Communication with Friends and Family:   . Frequency of Social Gatherings with Friends and Family:   .  Attends Religious Services:   . Active Member of Clubs or Organizations:   . Attends Banker Meetings:   Marland Kitchen Marital Status:   Intimate Partner Violence:   . Fear of Current or Ex-Partner:   . Emotionally Abused:   Marland Kitchen Physically Abused:   . Sexually Abused:       PHYSICAL EXAM  Vitals:   07/05/19 1359  BP: (!) 155/82  Pulse: (!) 119  Weight: 109 lb (49.4 kg)  Height: 5\' 1"  (1.549 m)   Body mass index is 20.6 kg/m.  Generalized: Well developed, in no acute distress   Neurological examination  Mentation: Alert oriented to time, place, history taking. Follows all commands speech and language fluent Cranial nerve II-XII: Pupils were equal round reactive to light. Extraocular movements were full, visual field were full on confrontational test. Facial sensation and strength were normal. Uvula tongue midline. Head turning and shoulder shrug  were normal and symmetric. Motor: The motor testing reveals 5 over 5 strength of all 4 extremities. Good symmetric motor tone is noted throughout.  Sensory: Sensory testing is intact to soft touch on all 4 extremities. No evidence of extinction is noted.  Coordination: Cerebellar testing reveals good finger-nose-finger and heel-to-shin bilaterally.  Gait and station: Gait is slightly wide-based.  Tandem gait not attempted. Reflexes: Deep tendon reflexes are symmetric and normal bilaterally.   DIAGNOSTIC DATA (LABS, IMAGING, TESTING) - I reviewed patient records, labs, notes, testing and imaging myself where available.  Lab Results  Component Value Date   WBC 3.8 (L) 03/18/2018   HGB 12.6 03/18/2018   HCT 39.0 03/18/2018   MCV 100.5 (H) 03/18/2018   PLT 159 03/18/2018      Component Value Date/Time   NA 136 03/18/2018 2110   K 3.3 (L) 03/18/2018 2110   CL 105 03/18/2018 2110   CO2 18 (L) 03/18/2018 2110   GLUCOSE 156 (H) 03/18/2018 2110   BUN <5 (L) 03/18/2018 2110   CREATININE 0.83 03/18/2018 2110   CALCIUM 8.1 (L)  03/18/2018 2110   PROT 7.7 12/20/2017 1521   ALBUMIN 3.2 (L) 12/20/2017 1521   AST 120 (H) 12/20/2017 1521   ALT 65 (H) 12/20/2017 1521   ALKPHOS 268 (H) 12/20/2017 1521   BILITOT 1.3 (H) 12/20/2017 1521   GFRNONAA >60 03/18/2018 2110   GFRAA >60 03/18/2018 2110   No results found for: CHOL, HDL, LDLCALC, LDLDIRECT, TRIG, CHOLHDL Lab Results  Component Value Date   HGBA1C 6.3 (H) 03/27/2015   No results found for: VITAMINB12 No results found for: TSH    ASSESSMENT AND PLAN 79 y.o. year old female  has a past medical history of Anxiety, Chronic diarrhea, Coarse tremors, Colon polyp, DDD (degenerative disc disease), cervical, Hiatal hernia, History of skin cancer, Hypercholesteremia, Hypertension, Migraines, Osteoporosis, Shortness of breath dyspnea, Tremor, and Weight loss. here with:  Essential tremor   Continue primidone 100 mg 3 times a day  Arm/joint pain bilaterally   Could be arthritic pain?  Patient notes that the joints in her hands are swelling like her mother.  Encouraged her to follow-up with her PCP for further work-up  I spent 20 minutes of face-to-face and non-face-to-face time with patient.  This included previsit chart review, lab review, study review, order entry, electronic health record documentation, patient education.  62, MSN, NP-C 07/05/2019, 2:14 PM [facility] [address] [place], [place] [phone]  I reviewed the above note and documentation by the Nurse Practitioner and agree with the history, exam, assessment and plan as outlined above. I was available for consultation. [phone], MD, PhD [facility])

## 2019-07-05 NOTE — Patient Instructions (Signed)
Your Plan:  Continue primidone 100 mg three times a day Follow-up with PCP regarding arm/joint pain If your symptoms worsen or you develop new symptoms please let us know.    Thank you for coming to see [place] at [facility]. I hope we have been able to provide you high quality care today.  You may receive a patient satisfaction survey over the next few weeks. We would appreciate your feedback and comments so that we may continue to improve ourselves and the health of our patients.

## 2019-07-27 DIAGNOSIS — M79603 Pain in arm, unspecified: Secondary | ICD-10-CM | POA: Diagnosis not present

## 2019-08-05 ENCOUNTER — Emergency Department (HOSPITAL_COMMUNITY)
Admission: EM | Admit: 2019-08-05 | Discharge: 2019-08-06 | Disposition: A | Discharge status: Home / Self Care | Payer: Medicare PPO | Attending: Emergency Medicine | Admitting: Emergency Medicine

## 2019-08-05 ENCOUNTER — Encounter (HOSPITAL_COMMUNITY): Payer: Self-pay | Admitting: Emergency Medicine

## 2019-08-05 ENCOUNTER — Other Ambulatory Visit: Payer: Self-pay

## 2019-08-05 DIAGNOSIS — I499 Cardiac arrhythmia, unspecified: Secondary | ICD-10-CM | POA: Diagnosis not present

## 2019-08-05 DIAGNOSIS — R112 Nausea with vomiting, unspecified: Secondary | ICD-10-CM | POA: Diagnosis not present

## 2019-08-05 DIAGNOSIS — Z5321 Procedure and treatment not carried out due to patient leaving prior to being seen by health care provider: Secondary | ICD-10-CM | POA: Insufficient documentation

## 2019-08-05 DIAGNOSIS — Z743 Need for continuous supervision: Secondary | ICD-10-CM | POA: Diagnosis not present

## 2019-08-05 DIAGNOSIS — R109 Unspecified abdominal pain: Secondary | ICD-10-CM | POA: Diagnosis not present

## 2019-08-05 DIAGNOSIS — R110 Nausea: Secondary | ICD-10-CM | POA: Diagnosis not present

## 2019-08-05 LAB — CBC
HCT: 41.9 % (ref 36.0–46.0)
Hemoglobin: 14.1 g/dL (ref 12.0–15.0)
MCH: 33.3 pg (ref 26.0–34.0)
MCHC: 33.7 g/dL (ref 30.0–36.0)
MCV: 99.1 fL (ref 80.0–100.0)
Platelets: 218 10*3/uL (ref 150–400)
RBC: 4.23 MIL/uL (ref 3.87–5.11)
RDW: 12.3 % (ref 11.5–15.5)
WBC: 5.8 10*3/uL (ref 4.0–10.5)
nRBC: 0 % (ref 0.0–0.2)

## 2019-08-05 LAB — COMPREHENSIVE METABOLIC PANEL
ALT: 23 U/L (ref 0–44)
AST: 48 U/L — ABNORMAL HIGH (ref 15–41)
Albumin: 3.3 g/dL — ABNORMAL LOW (ref 3.5–5.0)
Alkaline Phosphatase: 122 U/L (ref 38–126)
Anion gap: 16 — ABNORMAL HIGH (ref 5–15)
BUN: 12 mg/dL (ref 8–23)
CO2: 20 mmol/L — ABNORMAL LOW (ref 22–32)
Calcium: 8.8 mg/dL — ABNORMAL LOW (ref 8.9–10.3)
Chloride: 98 mmol/L (ref 98–111)
Creatinine, Ser: 0.94 mg/dL (ref 0.44–1.00)
GFR calc Af Amer: >60 mL/min (ref >60)
GFR calc non Af Amer: 59 mL/min — ABNORMAL LOW (ref >60)
Glucose, Bld: 139 mg/dL — ABNORMAL HIGH (ref 70–99)
Potassium: 4.3 mmol/L (ref 3.5–5.1)
Sodium: 134 mmol/L — ABNORMAL LOW (ref 135–145)
Total Bilirubin: 1.5 mg/dL — ABNORMAL HIGH (ref 0.3–1.2)
Total Protein: 7.4 g/dL (ref 6.5–8.1)

## 2019-08-05 LAB — LIPASE, BLOOD: Lipase: 30 U/L (ref 11–51)

## 2019-08-05 MED ORDER — SODIUM CHLORIDE 0.9% FLUSH: 3.0000 mL | Freq: Once | INTRAVENOUS | Status: DC

## 2019-08-05 NOTE — ED Triage Notes (Signed)
Pt to triage via [facility] from home.  Reports generalized abd pain, nausea, vomiting, and diarrhea x 2 weeks.  CBG 121.  Marland Kitchen

## 2019-08-10 DIAGNOSIS — K52831 Collagenous colitis: Secondary | ICD-10-CM | POA: Diagnosis not present

## 2019-08-10 DIAGNOSIS — R1110 Vomiting, unspecified: Secondary | ICD-10-CM | POA: Diagnosis not present

## 2019-08-10 DIAGNOSIS — R634 Abnormal weight loss: Secondary | ICD-10-CM | POA: Diagnosis not present

## 2019-08-13 ENCOUNTER — Inpatient Hospital Stay (HOSPITAL_COMMUNITY)
Admission: EM | Admit: 2019-08-13 | Discharge: 2019-08-23 | DRG: 314 | Disposition: A | Discharge status: Skilled Nursing Facility | Payer: Medicare PPO | Attending: Family Medicine | Admitting: Family Medicine

## 2019-08-13 ENCOUNTER — Other Ambulatory Visit: Payer: Self-pay | Admitting: Gastroenterology

## 2019-08-13 ENCOUNTER — Emergency Department (HOSPITAL_COMMUNITY): Payer: Medicare PPO

## 2019-08-13 ENCOUNTER — Other Ambulatory Visit: Payer: Self-pay

## 2019-08-13 DIAGNOSIS — I959 Hypotension, unspecified: Secondary | ICD-10-CM | POA: Diagnosis present

## 2019-08-13 DIAGNOSIS — Z20822 Contact with and (suspected) exposure to covid-19: Secondary | ICD-10-CM | POA: Diagnosis not present

## 2019-08-13 DIAGNOSIS — R531 Weakness: Secondary | ICD-10-CM | POA: Diagnosis not present

## 2019-08-13 DIAGNOSIS — E43 Unspecified severe protein-calorie malnutrition: Secondary | ICD-10-CM | POA: Diagnosis not present

## 2019-08-13 DIAGNOSIS — R112 Nausea with vomiting, unspecified: Secondary | ICD-10-CM | POA: Diagnosis not present

## 2019-08-13 DIAGNOSIS — K52831 Collagenous colitis: Secondary | ICD-10-CM | POA: Diagnosis not present

## 2019-08-13 DIAGNOSIS — Z801 Family history of malignant neoplasm of trachea, bronchus and lung: Secondary | ICD-10-CM

## 2019-08-13 DIAGNOSIS — E860 Dehydration: Secondary | ICD-10-CM | POA: Diagnosis present

## 2019-08-13 DIAGNOSIS — R0602 Shortness of breath: Secondary | ICD-10-CM | POA: Diagnosis not present

## 2019-08-13 DIAGNOSIS — R0902 Hypoxemia: Secondary | ICD-10-CM | POA: Diagnosis present

## 2019-08-13 DIAGNOSIS — Z881 Allergy status to other antibiotic agents status: Secondary | ICD-10-CM

## 2019-08-13 DIAGNOSIS — R579 Shock, unspecified: Secondary | ICD-10-CM | POA: Diagnosis not present

## 2019-08-13 DIAGNOSIS — A072 Cryptosporidiosis: Secondary | ICD-10-CM | POA: Diagnosis present

## 2019-08-13 DIAGNOSIS — K580 Irritable bowel syndrome with diarrhea: Secondary | ICD-10-CM | POA: Diagnosis not present

## 2019-08-13 DIAGNOSIS — Z8249 Family history of ischemic heart disease and other diseases of the circulatory system: Secondary | ICD-10-CM

## 2019-08-13 DIAGNOSIS — K529 Noninfective gastroenteritis and colitis, unspecified: Secondary | ICD-10-CM | POA: Diagnosis present

## 2019-08-13 DIAGNOSIS — R269 Unspecified abnormalities of gait and mobility: Secondary | ICD-10-CM | POA: Diagnosis present

## 2019-08-13 DIAGNOSIS — J9811 Atelectasis: Secondary | ICD-10-CM | POA: Diagnosis not present

## 2019-08-13 DIAGNOSIS — E869 Volume depletion, unspecified: Secondary | ICD-10-CM | POA: Diagnosis not present

## 2019-08-13 DIAGNOSIS — Z825 Family history of asthma and other chronic lower respiratory diseases: Secondary | ICD-10-CM

## 2019-08-13 DIAGNOSIS — G250 Essential tremor: Secondary | ICD-10-CM | POA: Diagnosis present

## 2019-08-13 DIAGNOSIS — R918 Other nonspecific abnormal finding of lung field: Secondary | ICD-10-CM | POA: Diagnosis not present

## 2019-08-13 DIAGNOSIS — R42 Dizziness and giddiness: Secondary | ICD-10-CM | POA: Diagnosis not present

## 2019-08-13 DIAGNOSIS — E871 Hypo-osmolality and hyponatremia: Secondary | ICD-10-CM | POA: Diagnosis present

## 2019-08-13 DIAGNOSIS — R5381 Other malaise: Secondary | ICD-10-CM | POA: Diagnosis not present

## 2019-08-13 DIAGNOSIS — M810 Age-related osteoporosis without current pathological fracture: Secondary | ICD-10-CM | POA: Diagnosis present

## 2019-08-13 DIAGNOSIS — J189 Pneumonia, unspecified organism: Secondary | ICD-10-CM | POA: Diagnosis present

## 2019-08-13 DIAGNOSIS — I9589 Other hypotension: Secondary | ICD-10-CM | POA: Diagnosis not present

## 2019-08-13 DIAGNOSIS — R197 Diarrhea, unspecified: Secondary | ICD-10-CM | POA: Diagnosis not present

## 2019-08-13 DIAGNOSIS — E785 Hyperlipidemia, unspecified: Secondary | ICD-10-CM | POA: Diagnosis present

## 2019-08-13 DIAGNOSIS — E876 Hypokalemia: Secondary | ICD-10-CM | POA: Diagnosis present

## 2019-08-13 DIAGNOSIS — Z681 Body mass index (BMI) 19 or less, adult: Secondary | ICD-10-CM | POA: Diagnosis not present

## 2019-08-13 DIAGNOSIS — Z8719 Personal history of other diseases of the digestive system: Secondary | ICD-10-CM

## 2019-08-13 DIAGNOSIS — Z7401 Bed confinement status: Secondary | ICD-10-CM | POA: Diagnosis not present

## 2019-08-13 DIAGNOSIS — R52 Pain, unspecified: Secondary | ICD-10-CM | POA: Diagnosis not present

## 2019-08-13 DIAGNOSIS — J90 Pleural effusion, not elsewhere classified: Secondary | ICD-10-CM | POA: Diagnosis not present

## 2019-08-13 DIAGNOSIS — R680 Hypothermia, not associated with low environmental temperature: Secondary | ICD-10-CM | POA: Diagnosis present

## 2019-08-13 DIAGNOSIS — K440 Diaphragmatic hernia with obstruction, without gangrene: Secondary | ICD-10-CM | POA: Diagnosis not present

## 2019-08-13 DIAGNOSIS — E861 Hypovolemia: Secondary | ICD-10-CM | POA: Diagnosis present

## 2019-08-13 DIAGNOSIS — F419 Anxiety disorder, unspecified: Secondary | ICD-10-CM | POA: Diagnosis present

## 2019-08-13 DIAGNOSIS — I517 Cardiomegaly: Secondary | ICD-10-CM | POA: Diagnosis not present

## 2019-08-13 DIAGNOSIS — N179 Acute kidney failure, unspecified: Secondary | ICD-10-CM | POA: Diagnosis not present

## 2019-08-13 DIAGNOSIS — Z87891 Personal history of nicotine dependence: Secondary | ICD-10-CM

## 2019-08-13 DIAGNOSIS — Z79899 Other long term (current) drug therapy: Secondary | ICD-10-CM

## 2019-08-13 DIAGNOSIS — I361 Nonrheumatic tricuspid (valve) insufficiency: Secondary | ICD-10-CM | POA: Diagnosis not present

## 2019-08-13 DIAGNOSIS — F411 Generalized anxiety disorder: Secondary | ICD-10-CM | POA: Diagnosis not present

## 2019-08-13 DIAGNOSIS — I340 Nonrheumatic mitral (valve) insufficiency: Secondary | ICD-10-CM | POA: Diagnosis not present

## 2019-08-13 DIAGNOSIS — K219 Gastro-esophageal reflux disease without esophagitis: Secondary | ICD-10-CM | POA: Diagnosis not present

## 2019-08-13 DIAGNOSIS — R21 Rash and other nonspecific skin eruption: Secondary | ICD-10-CM | POA: Diagnosis not present

## 2019-08-13 DIAGNOSIS — R0600 Dyspnea, unspecified: Secondary | ICD-10-CM

## 2019-08-13 DIAGNOSIS — K449 Diaphragmatic hernia without obstruction or gangrene: Secondary | ICD-10-CM | POA: Diagnosis not present

## 2019-08-13 DIAGNOSIS — I10 Essential (primary) hypertension: Secondary | ICD-10-CM | POA: Diagnosis not present

## 2019-08-13 DIAGNOSIS — R1084 Generalized abdominal pain: Secondary | ICD-10-CM | POA: Diagnosis not present

## 2019-08-13 DIAGNOSIS — M2550 Pain in unspecified joint: Secondary | ICD-10-CM | POA: Diagnosis not present

## 2019-08-13 LAB — CBC WITH DIFFERENTIAL/PLATELET
Abs Immature Granulocytes: 0.08 10*3/uL — ABNORMAL HIGH (ref 0.00–0.07)
Basophils Absolute: 0.1 10*3/uL (ref 0.0–0.1)
Basophils Relative: 1 %
Eosinophils Absolute: 0 10*3/uL (ref 0.0–0.5)
Eosinophils Relative: 1 %
HCT: 36.3 % (ref 36.0–46.0)
Hemoglobin: 11.9 g/dL — ABNORMAL LOW (ref 12.0–15.0)
Immature Granulocytes: 1 %
Lymphocytes Relative: 12 %
Lymphs Abs: 0.9 10*3/uL (ref 0.7–4.0)
MCH: 33.5 pg (ref 26.0–34.0)
MCHC: 32.8 g/dL (ref 30.0–36.0)
MCV: 102.3 fL — ABNORMAL HIGH (ref 80.0–100.0)
Monocytes Absolute: 1 10*3/uL (ref 0.1–1.0)
Monocytes Relative: 13 %
Neutro Abs: 5.6 10*3/uL (ref 1.7–7.7)
Neutrophils Relative %: 72 %
Platelets: 206 10*3/uL (ref 150–400)
RBC: 3.55 MIL/uL — ABNORMAL LOW (ref 3.87–5.11)
RDW: 12.1 % (ref 11.5–15.5)
WBC: 7.7 10*3/uL (ref 4.0–10.5)
nRBC: 0 % (ref 0.0–0.2)

## 2019-08-13 LAB — COMPREHENSIVE METABOLIC PANEL
ALT: 17 U/L (ref 0–44)
AST: 29 U/L (ref 15–41)
Albumin: 2.5 g/dL — ABNORMAL LOW (ref 3.5–5.0)
Alkaline Phosphatase: 83 U/L (ref 38–126)
Anion gap: 17 — ABNORMAL HIGH (ref 5–15)
BUN: 46 mg/dL — ABNORMAL HIGH (ref 8–23)
CO2: 14 mmol/L — ABNORMAL LOW (ref 22–32)
Calcium: 6.5 mg/dL — ABNORMAL LOW (ref 8.9–10.3)
Chloride: 103 mmol/L (ref 98–111)
Creatinine, Ser: 2.04 mg/dL — ABNORMAL HIGH (ref 0.44–1.00)
GFR calc Af Amer: 27 mL/min — ABNORMAL LOW (ref >60)
GFR calc non Af Amer: 23 mL/min — ABNORMAL LOW (ref >60)
Glucose, Bld: 90 mg/dL (ref 70–99)
Potassium: 2.8 mmol/L — ABNORMAL LOW (ref 3.5–5.1)
Sodium: 134 mmol/L — ABNORMAL LOW (ref 135–145)
Total Bilirubin: 1.2 mg/dL (ref 0.3–1.2)
Total Protein: 5.8 g/dL — ABNORMAL LOW (ref 6.5–8.1)

## 2019-08-13 LAB — LIPASE, BLOOD: Lipase: 32 U/L (ref 11–51)

## 2019-08-13 LAB — CBG MONITORING, ED
Glucose-Capillary: 74 mg/dL (ref 70–99)
Glucose-Capillary: 106 mg/dL — ABNORMAL HIGH (ref 70–99)

## 2019-08-13 LAB — SARS CORONAVIRUS 2 BY RT PCR (HOSPITAL ORDER, PERFORMED IN ~~LOC~~ HOSPITAL LAB): SARS Coronavirus 2: NEGATIVE

## 2019-08-13 LAB — MAGNESIUM: Magnesium: 1.6 mg/dL — ABNORMAL LOW (ref 1.7–2.4)

## 2019-08-13 LAB — LACTIC ACID, PLASMA: Lactic Acid, Venous: 1.4 mmol/L (ref 0.5–1.9)

## 2019-08-13 MED ORDER — ONDANSETRON HCL 4 MG/2ML IJ SOLN
4.0000 mg | Freq: Four times a day (QID) | INTRAMUSCULAR | Status: DC | PRN
  Administered 2019-08-20: 4 mg via INTRAVENOUS
  Filled 2019-08-13 (×2): qty 2

## 2019-08-13 MED ORDER — DEXTROSE 10 % IV SOLN: INTRAVENOUS | Status: DC

## 2019-08-13 MED ORDER — DEXTROSE 5 % IV SOLN: Freq: Once | INTRAVENOUS | Status: DC

## 2019-08-13 MED ORDER — METHYLPREDNISOLONE SODIUM SUCC 125 MG IJ SOLR
60.0000 mg | Freq: Four times a day (QID) | INTRAMUSCULAR | Status: DC
  Administered 2019-08-13 – 2019-08-14 (×3): 60 mg via INTRAVENOUS
  Filled 2019-08-13 (×3): qty 2

## 2019-08-13 MED ORDER — ONDANSETRON HCL 4 MG PO TABS: 4.0000 mg | ORAL_TABLET | Freq: Four times a day (QID) | ORAL | Status: DC | PRN

## 2019-08-13 MED ORDER — LACTATED RINGERS IV BOLUS
1000.0000 mL | Freq: Once | INTRAVENOUS | Status: AC
  Administered 2019-08-13: 1000 mL via INTRAVENOUS

## 2019-08-13 MED ORDER — ONDANSETRON HCL 4 MG/2ML IJ SOLN: 4.0000 mg | Freq: Once | INTRAMUSCULAR | Status: AC

## 2019-08-13 MED ORDER — ALPRAZOLAM 0.25 MG PO TABS
0.2500 mg | ORAL_TABLET | Freq: Every day | ORAL | Status: DC | PRN
  Administered 2019-08-14 – 2019-08-21 (×7): 0.25 mg via ORAL
  Filled 2019-08-13 (×7): qty 1

## 2019-08-13 MED ORDER — POTASSIUM CHLORIDE CRYS ER 20 MEQ PO TBCR
40.0000 meq | EXTENDED_RELEASE_TABLET | Freq: Once | ORAL | Status: DC
  Filled 2019-08-13: qty 2

## 2019-08-13 MED ORDER — SODIUM CHLORIDE 0.9 % IV BOLUS
1000.0000 mL | Freq: Once | INTRAVENOUS | Status: AC
  Administered 2019-08-13: 1000 mL via INTRAVENOUS

## 2019-08-13 MED ORDER — POTASSIUM CHLORIDE 10 MEQ/100ML IV SOLN
10.0000 meq | INTRAVENOUS | Status: AC
  Administered 2019-08-13 (×3): 10 meq via INTRAVENOUS
  Filled 2019-08-13 (×3): qty 100

## 2019-08-13 MED ORDER — DEXTROSE-NACL 5-0.45 % IV SOLN: INTRAVENOUS | Status: DC

## 2019-08-13 MED ORDER — ONDANSETRON HCL 4 MG/2ML IJ SOLN
INTRAMUSCULAR | Status: AC
  Administered 2019-08-13: 4 mg via INTRAVENOUS
  Filled 2019-08-13: qty 2

## 2019-08-13 MED ORDER — POTASSIUM CHLORIDE 2 MEQ/ML IV SOLN
INTRAVENOUS | Status: DC
  Filled 2019-08-13 (×3): qty 1000

## 2019-08-13 NOTE — ED Triage Notes (Signed)
[facility] reports pt from home, sent by doc for 16.6 WBC and decreased kidney function. BP 82/40 at doc last week. Hr 100 bp 72/34, cbg 110, capno 24 , dizzy and lightheaded, Diarrhea x1 mth. Pt given 1000 bolus en route - bp 92/50

## 2019-08-13 NOTE — ED Notes (Signed)
Patient transported to CT 

## 2019-08-13 NOTE — ED Notes (Signed)
Called for EDP to see pt/

## 2019-08-13 NOTE — H&P (Addendum)
Brenda [name] is an 79 y.o. female.   Chief Complaint: Intractable nausea/vomiting/diarrhea with worsening generalized weakness and occasional epigastric abdominal pain. HPI: The patient is a 79 yr old woman who carries a past medical history significant for Hiatal hernia (s/p Nissan Fundoplication 03/2015), chronic diarrhea, IBS, GERD, Essential Tremor, colon polyps). She states that she has had intractable nausea, vomiting, and diarrhea for the past 4 weeks. It has become particularly severe in the past week. The patient states that she has chronic diarrhea which has been ongoing for years. She sees Dr. Evette [facility]alGanem with [facility]. Although from her PTA med list it appears that the patient is being treated for IBS, the patient states that Dr. 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 She states that it had been under control with medications that she was put on for it until the last 4 months when she has had more problems.  She states that in the last day or two her diarrhea has slowed down, because she isn't able to keep anything down. She states that everything she swallows she throws up. She states that she has epigastric abdominal pain. She also has been throwing up her medications.  In the ED the patient the patient was found to be hypotensive with a systolic in the low 80's, tachycardia, and mild hypothermia. Oxygenation has been in the 90's on room air.   Labwork demonstrated Magnesium of 1.6, Potassium of 2.8, Creatinine of 2.04 (baseline is 1.0), BUN 46, CO2 14. Lactic acid is 1.4.  CT abdomen and pelvis demonstrated evidence of enteritic in the right lower quadrant. And although the patient underwent a Nissan Fundoplication in 2017, it demonstrated a moderate hiatal hernia with stomach within the hernia.  [facility] were consulted to admit the patient for further evaluation and treatment.  Past Medical History:   Diagnosis Date  . Anxiety   . Chronic diarrhea   . Coarse tremors   . Colon polyp   . DDD (degenerative disc disease), cervical   . Hiatal hernia   . History of skin cancer   . Hypercholesteremia   . Hypertension   . Migraines   . Osteoporosis   . Shortness of breath dyspnea    with exertion  . Tremor   . Weight loss     Past Surgical History:  Procedure Laterality Date  . ESOPHAGEAL MANOMETRY N/A 01/15/2015   Procedure: ESOPHAGEAL MANOMETRY (EM);  Surgeon: Graylin ShiverSalem F Ganem, MD;  Location: [facility];  Service: Endoscopy;  Laterality: N/A;  . HIATAL HERNIA REPAIR    . KYPHOPLASTY N/A 12/22/2017   Procedure: LUMBAR TWO KYPHOPLASTY;  Surgeon: Estill Bambergumonski, Mark, MD;  Location: [facility];  Service: Orthopedics;  Laterality: N/A;  LUMBAR TWO KYPHOPLASTY  . RHINOPLASTY    . SINUS SURGERY WITH INSTATRAK      Family History  Problem Relation Age of Onset  . Lung cancer Mother   . COPD Father   . Hypertension Brother    Social History:  reports that she quit smoking about 30 years ago. She has never used smokeless tobacco. She reports current alcohol use. She reports that she does not use drugs. (Not in a hospital admission)   Allergies:  Allergies  Allergen Reactions  . Diclofenac Other (See Comments)    Stomach burning  . Macrodantin [Nitrofurantoin Macrocrystal] Itching, Nausea And Vomiting, Swelling and Rash    Tongue swelling  . Tramadol  Other (See Comments)    Stomach burning  . Codeine Nausea And Vomiting  . Topamax [Topiramate] Other (See Comments)    Loss of taste, fatigue    Pertinent items noted in HPI and remainder of comprehensive ROS otherwise negative.   General appearance: alert, cooperative and mild distress Head: Normocephalic, without obvious abnormality, atraumatic Eyes: conjunctivae/corneas clear. PERRL, EOM's intact. Fundi benign. Throat: lips, mucosa, and tongue normal; teeth and gums normal Neck: no adenopathy, no carotid bruit, no JVD, supple,  symmetrical, trachea midline and thyroid not enlarged, symmetric, no tenderness/mass/nodules Resp: No increased work of breathing. No wheezes, rales, or rhonchi.  No tactile fremitus. Chest wall: no tenderness Cardio: regular rate and rhythm, S1, S2 normal, no murmur, click, rub or gallop GI: Soft, positive for epigastric tenderness and tenderness in the right lower quadrant, hypoactive bowel sounds. Extremities: extremities normal, atraumatic, no cyanosis or edema Pulses: 2+ and symmetric Skin: Skin color, texture, turgor normal. No rashes or lesions Lymph nodes: Cervical, supraclavicular, and axillary nodes normal. Neurologic: Alert and oriented X 3, normal strength and tone. Normal symmetric reflexes. Normal coordination and gait  Results for orders placed or performed during the hospital encounter of 08/13/19 (from the past 48 hour(s))  Comprehensive metabolic panel     Status: Abnormal   Collection Time: 08/13/19  2:47 PM  Result Value Ref Range   Sodium 134 (L) 135 - 145 mmol/L   Potassium 2.8 (L) 3.5 - 5.1 mmol/L   Chloride 103 98 - 111 mmol/L   CO2 14 (L) 22 - 32 mmol/L   Glucose, Bld 90 70 - 99 mg/dL    Comment: Glucose reference range applies only to samples taken after fasting for at least 8 hours.   BUN 46 (H) 8 - 23 mg/dL   Creatinine, Ser 4.74 (H) 0.44 - 1.00 mg/dL   Calcium 6.5 (L) 8.9 - 10.3 mg/dL   Total Protein 5.8 (L) 6.5 - 8.1 g/dL   Albumin 2.5 (L) 3.5 - 5.0 g/dL   AST 29 15 - 41 U/L   ALT 17 0 - 44 U/L   Alkaline Phosphatase 83 38 - 126 U/L   Total Bilirubin 1.2 0.3 - 1.2 mg/dL   GFR calc non Af Amer 23 (L) >60 mL/min   GFR calc Af Amer 27 (L) >60 mL/min   Anion gap 17 (H) 5 - 15    Comment: Performed at [facility], [address], [place], [place] [address]  CBC with Differential     Status: Abnormal   Collection Time: 08/13/19  2:47 PM  Result Value Ref Range   WBC 7.7 4.0 - 10.5 K/uL   RBC 3.55 (L) 3.87 - 5.11 MIL/uL   Hemoglobin 11.9 (L) 12.0 -  15.0 g/dL   HCT 38.7 36 - 46 %   MCV 102.3 (H) 80.0 - 100.0 fL   MCH 33.5 26.0 - 34.0 pg   MCHC 32.8 30.0 - 36.0 g/dL   RDW 56.4 33.2 - 95.1 %   Platelets 206 150 - 400 K/uL   nRBC 0.0 0.0 - 0.2 %   Neutrophils Relative % 72 %   Neutro Abs 5.6 1.7 - 7.7 K/uL   Lymphocytes Relative 12 %   Lymphs Abs 0.9 0.7 - 4.0 K/uL   Monocytes Relative 13 %   Monocytes Absolute 1.0 0 - 1 K/uL   Eosinophils Relative 1 %   Eosinophils Absolute 0.0 0 - 0 K/uL   Basophils Relative 1 %  Basophils Absolute 0.1 0 - 0 K/uL   Immature Granulocytes 1 %   Abs Immature Granulocytes 0.08 (H) 0.00 - 0.07 K/uL    Comment: Performed at [facility], [address], [place], [place] [address]  Lipase, blood     Status: None   Collection Time: 08/13/19  2:47 PM  Result Value Ref Range   Lipase 32 11 - 51 U/L    Comment: Performed at [facility], [address] [place], [place] [address]  Lactic acid, plasma     Status: None   Collection Time: 08/13/19  3:03 PM  Result Value Ref Range   Lactic Acid, Venous 1.4 0.5 - 1.9 mmol/L    Comment: Performed at [facility], [address], [place], [place] [address]  CBG monitoring, ED     Status: None   Collection Time: 08/13/19  3:09 PM  Result Value Ref Range   Glucose-Capillary 74 70 - 99 mg/dL    Comment: Glucose reference range applies only to samples taken after fasting for at least 8 hours.  Magnesium     Status: Abnormal   Collection Time: 08/13/19  3:58 PM  Result Value Ref Range   Magnesium 1.6 (L) 1.7 - 2.4 mg/dL    Comment: Performed at [facility], [address] [place], [place] [address]  CBG monitoring, ED     Status: Abnormal   Collection Time: 08/13/19  4:53 PM  Result Value Ref Range   Glucose-Capillary 106 (H) 70 - 99 mg/dL    Comment: Glucose reference range applies only to samples taken after fasting for at least 8 hours.   @[id number] @  Blood pressure (!) 83/38, pulse (!) 108, temperature 97.8 F (36.6 C),  temperature source Oral, resp. rate 17, height 5\' 4"  (1.626 m), weight 45.4 kg, SpO2 95 %.    Assessment/Plan Problem  Aki (Acute Kidney Injury) (Hcc)  Hiatal Hernia With Obstruction But No Gangrene  Nausea Vomiting and Diarrhea  Volume Depletion  Hypotension  Enteritis  Paraesophageal hiatal hernia s/p repair w biologic mesh 03/26/2015  GERD (gastroesophageal reflux disease) s/p Nissen fundoplication 03/26/2015   Intractable Nausea, Vomiting, and Diarrhea: DDx: IBS exacerbation, Enteritis of the LLQ, Recurrent hiatal hernia with possible obstruction. Have ordered Upper GI with SB Follow through in the am. I have started IV steroids as the patient is unable to keep anything down. Day team will need to consult [facility] in the morning and possibly surgery depending on the results of the upper GI. The patient is being supported with IV LR, supplementary potassium and magnesium. She will receivine antiemetics and will be kept NPO for now as she is unable to keep anything down.  Recurrent Hiatal Hernia following Nissen fundoplication with biomesh in 03/2015: CT abdomen and pelvis demonstrates a moderate hiatal hernia with stomach contained in the hernia. Given the patient's complaint of throwing up anything she tries to eat, I have some concern for obstruction. However, normal WBC and lack of severe pain (she does complain of some epigastric discomfort), I do not suspect strangulation or gangrene at this point. Upper GI with small bowel follow through has been ordered.   Enteritis: Have ordered a GI pathogen panel. Do not suspect C Diff.   Volume depletion: Resulting in hypotension and AKI. Continue IV LR. Hold antihypertensives.   AKI: Avoid nephrotoxins and hypotension. Monitor creatinine, electrolytes, and volume status.  I have seen and examined this patient myself. I have spent  76 minutes in her evaluation and care.  DVT Prophylaxis: SCD's CODE STATUS: Full Code Family Communication: None  available Disposition: This patient is from home. She will be admitted to a telemetry bed in inpatient status. I anticipate that she will discharge to home.  Severity of Illness: The appropriate patient status for this patient is INPATIENT. Inpatient status is judged to be reasonable and necessary in order to provide the required intensity of service to ensure the patient's safety. The patient's presenting symptoms, physical exam findings, and initial radiographic and laboratory data in the context of their chronic comorbidities is felt to place them at high risk for further clinical deterioration. Furthermore, it is not anticipated that the patient will be medically stable for discharge from the hospital within 2 midnights of admission. The following factors support the patient status of inpatient.   " The patient's presenting symptoms include nausea, vomiting, diarrhea. " The worrisome physical exam findings include dry mucous memebranes, hypotension, tachycardia. " The initial radiographic and laboratory data are worrisome because of AKI, hypokalemia, hypomagnesemia. " The chronic co-morbidities include IBS.   * I certify that at the point of admission it is my clinical judgment that the patient will require inpatient hospital care spanning beyond 2 midnights from the point of admission due to high intensity of service, high risk for further deterioration and high frequency of surveillance required.*  Domnique Vantine 08/13/2019, 8:40 PM  ADDENDUM: The patient has been consistently running blood pressures with systolics in the 70's. While she has been receiving her three liter boluses of IV fluids she has briefly come up to the the 80's and then drops down into the 70's again. I have started her on Levophed and discussed the patient with Dr. Dellie Catholic who has accepted her for admission to the ICU.

## 2019-08-13 NOTE — ED Notes (Signed)
Ariel, RN contacted admitting provider regarding BP for bed request change.

## 2019-08-13 NOTE — ED Notes (Addendum)
Patient transported to CT 

## 2019-08-13 NOTE — ED Provider Notes (Signed)
[facility]   CSN: [id number] Arrival date & time: 08/13/19  1410     History Chief Complaint  Patient presents with  . Dizziness  . Emesis    Brenda [name] is a 79 y.o. female with a past medical history of hypertension, anxiety, chronic diarrhea, GERD presenting to the ED with a chief complaint of generalized weakness, nausea, vomiting and diarrhea.  For the past 4 weeks has been having persistent watery diarrhea that is worse than her typical chronic diarrhea.  Also reports several episodes of nonbloody, nonbilious emesis daily when attempting to eat or drink.  She was seen and evaluated by her PCP 2 weeks ago and was sent to her GI doctor.  They did lab work at the GI clinic 1 week ago and was told that she had a white blood cell count of 16 and a "abnormal kidney function."  She denies any injuries or falls.  Reports sometimes having upper abdominal pain radiating down.  She reports generalized weakness, dizziness.  She cannot recall any incident 4 weeks ago that may have triggered this vomiting or diarrhea, no sick contacts with similar symptoms.  No urinary symptoms.  Denies any shortness of breath, cough, back pain.  HPI     Past Medical History:  Diagnosis Date  . Anxiety   . Chronic diarrhea   . Coarse tremors   . Colon polyp   . DDD (degenerative disc disease), cervical   . Hiatal hernia   . History of skin cancer   . Hypercholesteremia   . Hypertension   . Migraines   . Osteoporosis   . Shortness of breath dyspnea    with exertion  . Tremor   . Weight loss     Patient Active Problem List   Diagnosis Date Noted  . AKI (acute kidney injury) (HCC) 08/13/2019  . Hiatal hernia with obstruction but no gangrene 08/13/2019  . Nausea vomiting and diarrhea 08/13/2019  . Volume depletion 08/13/2019  . Hypotension 08/13/2019  . Enteritis 08/13/2019  . Paraesophageal hiatal hernia s/p repair w biologic mesh 03/26/2015  03/27/2015  . GERD (gastroesophageal reflux disease) s/p Nissen fundoplication 03/26/2015 03/26/2015    Past Surgical History:  Procedure Laterality Date  . ESOPHAGEAL MANOMETRY N/A 01/15/2015   Procedure: ESOPHAGEAL MANOMETRY (EM);  Surgeon: Graylin ShiverSalem F Ganem, MD;  Location: [facility] END[facility]COPY;  Service: Endoscopy;  Laterality: N/A;  . HIATAL HERNIA REPAIR    . KYPHOPLASTY N/A 12/22/2017   Procedure: LUMBAR TWO KYPHOPLASTY;  Surgeon: Estill Bambergumonski, Mark, MD;  Location: [facility] OR;  Service: Orthopedics;  Laterality: N/A;  LUMBAR TWO KYPHOPLASTY  . RHINOPLASTY    . SINUS SURGERY WITH INSTATRAK       OB History   No obstetric history on file.     Family History  Problem Relation Age of Onset  . Lung cancer Mother   . COPD Father   . Hypertension Brother     Social History   Tobacco Use  . Smoking status: Former Smoker    Quit date: 09/11/1988    Years since quitting: 30.9  . Smokeless tobacco: Never Used  Vaping Use  . Vaping Use: Never used  Substance Use Topics  . Alcohol use: Yes    Alcohol/week: 0.0 standard drinks    Comment: OCCASIONAL  . Drug use: No    Home Medications Prior to Admission medications   Medication Sig Start Date End Date Taking? Authorizing Provider  ALPRAZolam Prudy Feeler(XANAX) 0.25 MG  tablet Take 0.25 mg by mouth daily as needed (tremors).  01/20/17  Yes [provider]  Apoaequorin (PREVAGEN PO) Take 1 capsule by mouth daily.   Yes [provider]  atorvastatin (LIPITOR) 20 MG tablet Take 20 mg by mouth every Monday, Wednesday, and Friday.    Yes [provider]  budesonide (ENTOCORT EC) 3 MG 24 hr capsule Take 9 mg by mouth daily before breakfast.   Yes [provider]  cholestyramine (QUESTRAN) 4 g packet Take 1 packet by mouth daily. 08/01/19  Yes [provider]  Eluxadoline (VIBERZI) 100 MG TABS Take 100 mg by mouth 2 (two) times daily.    Yes [provider]  famotidine (PEPCID) 40 MG tablet Take 40 mg by mouth daily.  07/07/19  Yes [provider]  ibuprofen (ADVIL) 200 MG tablet Take 200 mg by mouth daily as needed for headache (pain).   Yes [provider]  lisinopril (PRINIVIL,ZESTRIL) 10 MG tablet Take 10 mg by mouth daily. 08/24/17  Yes [provider]  Multiple Vitamins-Minerals (PRESERVISION AREDS 2) CAPS Take 1 capsule by mouth daily.   Yes [provider]  primidone (MYSOLINE) 50 MG tablet Take 2 tablets (100 mg total) by mouth 3 (three) times daily. 04/04/19  Yes Butch Penny, NP  promethazine (PHENERGAN) 25 MG tablet Take 25 mg by mouth 5 (five) times daily as needed for nausea or vomiting.  08/10/19  Yes [provider]  verapamil (CALAN-SR) 240 MG CR tablet Take 240 mg by mouth daily.  02/25/15  Yes [provider]  Vitamin D, Ergocalciferol, (DRISDOL) 1.25 MG (50000 UNIT) CAPS capsule Take 50,000 Units by mouth every 7 (seven) days.   Yes [provider]    Allergies    Diclofenac, Macrodantin [nitrofurantoin macrocrystal], Tramadol, Codeine, and Topamax [topiramate]  Review of Systems   Review of Systems  Constitutional: Negative for appetite change, chills and fever.  HENT: Negative for ear pain, rhinorrhea, sneezing and sore throat.   Eyes: Negative for photophobia and visual disturbance.  Respiratory: Negative for cough, chest tightness, shortness of breath and wheezing.   Cardiovascular: Negative for chest pain and palpitations.  Gastrointestinal: Positive for diarrhea, nausea and vomiting. Negative for abdominal pain, blood in stool and constipation.  Genitourinary: Negative for dysuria, hematuria and urgency.  Musculoskeletal: Negative for myalgias.  Skin: Negative for rash.  Neurological: Negative for dizziness, weakness and light-headedness.    Physical Exam Updated Vital Signs BP (!) 86/50   Pulse (!) 107   Temp 97.8 F (36.6 C) (Oral)   Resp 17   Ht 5\' 4"  (1.626 m)   Wt 45.4 kg   SpO2 92%   BMI 17.16 kg/m    Physical Exam Vitals and nursing note reviewed.  Constitutional:      General: She is not in acute distress.    Appearance: She is well-developed.  HENT:     Head: Normocephalic and atraumatic.     Nose: Nose normal.  Eyes:     General: No scleral icterus.       Left eye: No discharge.     Conjunctiva/sclera: Conjunctivae normal.  Cardiovascular:     Rate and Rhythm: Normal rate and regular rhythm.     Heart sounds: Normal heart sounds. No murmur heard.  No friction rub. No gallop.   Pulmonary:     Effort: Pulmonary effort is normal. No respiratory distress.     Breath sounds: Normal breath sounds.  Abdominal:  General: Bowel sounds are normal. There is no distension.     Palpations: Abdomen is soft.     Tenderness: There is no abdominal tenderness. There is no guarding.  Musculoskeletal:        General: Normal range of motion.     Cervical back: Normal range of motion and neck supple.  Skin:    General: Skin is warm and dry.     Findings: No rash.  Neurological:     Mental Status: She is alert.     Motor: No abnormal muscle tone.     Coordination: Coordination normal.     ED Results / Procedures / Treatments   Labs (all labs ordered are listed, but only abnormal results are displayed) Labs Reviewed  COMPREHENSIVE METABOLIC PANEL - Abnormal; Notable for the following components:      Result Value   Sodium 134 (*)    Potassium 2.8 (*)    CO2 14 (*)    BUN 46 (*)    Creatinine, Ser 2.04 (*)    Calcium 6.5 (*)    Total Protein 5.8 (*)    Albumin 2.5 (*)    GFR calc non Af Amer 23 (*)    GFR calc Af Amer 27 (*)    Anion gap 17 (*)    All other components within normal limits  CBC WITH DIFFERENTIAL/PLATELET - Abnormal; Notable for the following components:   RBC 3.55 (*)    Hemoglobin 11.9 (*)    MCV 102.3 (*)    Abs Immature Granulocytes 0.08 (*)    All other components within normal limits  MAGNESIUM - Abnormal; Notable for the following components:    Magnesium 1.6 (*)    All other components within normal limits  CBG MONITORING, ED - Abnormal; Notable for the following components:   Glucose-Capillary 106 (*)    All other components within normal limits  SARS CORONAVIRUS 2 BY RT PCR (HOSPITAL ORDER, PERFORMED IN [facility])  URINE CULTURE  CULTURE, BLOOD (ROUTINE X 2)  CULTURE, BLOOD (ROUTINE X 2)  GASTROINTESTINAL PANEL BY PCR, STOOL (REPLACES STOOL CULTURE)  LIPASE, BLOOD  LACTIC ACID, PLASMA  URINALYSIS, ROUTINE W REFLEX MICROSCOPIC  BASIC METABOLIC PANEL  CBC  BASIC METABOLIC PANEL  CBG MONITORING, ED    EKG EKG Interpretation  Date/Time:  Monday August 13 2019 16:42:52 EDT Ventricular Rate:  143 PR Interval:    QRS Duration: 102 QT Interval:  335 QTC Calculation: 517 R Axis:   82 Text Interpretation: Atrial fibrillation with rapid V-rate Ventricular premature complex Borderline right axis deviation Low voltage, precordial leads Repolarization abnormality, prob rate related Confirmed by Raeford Razor [phone]) on 08/13/2019 5:39:04 PM   Radiology CT ABDOMEN PELVIS WO CONTRAST  Result Date: 08/13/2019 CLINICAL DATA:  79 year old female with abdominal pain. Concern for acute diverticulitis. EXAM: CT ABDOMEN AND PELVIS WITHOUT CONTRAST TECHNIQUE: Multidetector CT imaging of the abdomen and pelvis was performed following the standard protocol without IV contrast. COMPARISON:  CT abdomen pelvis dated 09/08/2015. FINDINGS: Evaluation of this exam is limited in the absence of intravenous contrast. Lower chest: There is diffuse interstitial and interlobular septal prominence consistent with edema. There are small bilateral pleural effusions. Coronary vascular calcifications noted. There is no intra-abdominal free air or free fluid. Hepatobiliary: The liver is unremarkable. No intrahepatic biliary duct dilatation. The gallbladder is unremarkable. Pancreas: Unremarkable. No pancreatic ductal dilatation or surrounding  inflammatory changes. Spleen: Normal in size without focal abnormality. Adrenals/Urinary Tract: The adrenal glands  are unremarkable. The kidneys, visualized ureters, and urinary bladder appear unremarkable. Stomach/Bowel: There is mild diffuse mesenteric stranding and edema with inflammatory changes adjacent to a loop of small bowel in the right lower quadrant. Findings may represent enteritis. Clinical correlation is recommended. There is no bowel obstruction. Loose stool noted within the colon compatible with diarrheal state. There is somewhat featureless appearance of the colon which may be related to underlying chronic inflammatory bowel disease. Correlation with history of ulcerative colitis recommended. There is a moderate size hiatal hernia containing a portion of the stomach. The appendix is not visualized with certainty. No inflammatory changes identified in the right lower quadrant. Vascular/Lymphatic: Advanced aortoiliac atherosclerotic disease. The IVC is unremarkable. No portal venous gas. There is no adenopathy. Reproductive: The uterus and ovaries are grossly unremarkable. Other: None Musculoskeletal: Osteopenia with degenerative changes of the spine. Old L2 compression fracture and vertebroplasty. No acute osseous pathology. IMPRESSION: 1. Findings likely represent enteritis and diarrheal state. Clinical correlation is recommended. No bowel obstruction. 2. Moderate size hiatal hernia containing a portion of the stomach. 3. Aortic Atherosclerosis (ICD10-I70.0). Electronically Signed   By: Elgie Collard M.D.   On: 08/13/2019 18:42   DG Chest 2 View  Result Date: 08/13/2019 CLINICAL DATA:  Weakness EXAM: CHEST - 2 VIEW COMPARISON:  September 28, 2017 FINDINGS: The cardiomediastinal silhouette is unchanged in contour.Atherosclerotic calcifications. No pleural effusion. No pneumothorax. Diffuse interstitial prominence, nonspecific. Mild LEFT basilar heterogeneous opacity. Visualized abdomen is  unremarkable. Multilevel degenerative changes of the thoracic spine. Status post kyphoplasty of a lumbar vertebral body. IMPRESSION: Mild LEFT basilar heterogeneous opacity. Differential considerations include aspiration, atelectasis or infection. Electronically Signed   By: Meda Klinefelter MD   On: 08/13/2019 16:09    Procedures .Critical Care Performed by: Dietrich Pates, PA-C Authorized by: Dietrich Pates, PA-C   Critical care provider statement:    Critical care time (minutes):  35   Critical care was necessary to treat or prevent imminent or life-threatening deterioration of the following conditions:  Cardiac failure, circulatory failure, dehydration and renal failure   Critical care was time spent personally by me on the following activities:  Development of treatment plan with patient or surrogate, discussions with consultants, evaluation of patient's response to treatment, examination of patient, obtaining history from patient or surrogate, ordering and performing treatments and interventions, ordering and review of laboratory studies, ordering and review of radiographic studies, pulse oximetry, review of old charts and re-evaluation of patient's condition   I assumed direction of critical care for this patient from another provider in my specialty: no     (including critical care time)  Medications Ordered in ED Medications  ondansetron (ZOFRAN) tablet 4 mg (has no administration in time range)    Or  ondansetron (ZOFRAN) injection 4 mg (has no administration in time range)  lactated ringers 1,000 mL with potassium chloride 20 mEq infusion (has no administration in time range)  ALPRAZolam (XANAX) tablet 0.25 mg (has no administration in time range)  methylPREDNISolone sodium succinate (SOLU-MEDROL) 125 mg/2 mL injection 60 mg (60 mg Intravenous Given 08/13/19 2156)  dextrose 10 % infusion (has no administration in time range)  lactated ringers bolus 1,000 mL (has no administration in  time range)  sodium chloride 0.9 % bolus 1,000 mL (0 mLs Intravenous Stopped 08/13/19 1453)  lactated ringers bolus 1,000 mL (0 mLs Intravenous Stopped 08/13/19 1648)  lactated ringers bolus 1,000 mL (0 mLs Intravenous Stopped 08/13/19 1648)  potassium chloride 10 mEq in 100 mL  IVPB (0 mEq Intravenous Stopped 08/13/19 2148)  ondansetron (ZOFRAN) injection 4 mg (4 mg Intravenous Given 08/13/19 1811)    ED Course  I have reviewed the triage vital signs and the nursing notes.  Pertinent labs & imaging results that were available during my care of the patient were reviewed by me and considered in my medical decision making (see chart for details).  Clinical Course as of Aug 13 2203  Mon Aug 13, 2019  1536 CBC with Differential(!) [EW]  1639 Elevated from 0.9 on 08/05/2019.  Creatinine(!): 2.04 [HK]  1639 Will replete IV and check magnesium level.  Potassium(!): 2.8 [HK]  1639 Lactic Acid, Venous: 1.4 [HK]    Clinical Course User Index [EW] Mancel Bale, MD [HK] Dietrich Pates, PA-C   MDM Rules/Calculators/A&P                          79 year old female with a past medical history of hypertension, chronic diarrhea, GERD presents to the ED with a chief complaint of generalized weakness, nausea, vomiting and diarrhea.  Reports persistent watery diarrhea for the past 4 weeks in addition to several episodes of nonbloody, nonbilious emesis daily with any attempt at p.o. intake.  She has been followed by GI for the past 15 years due to her chronic diarrhea although states that this feels worse.  Reports upper abdominal pain radiating to her lower abdomen.  Reports generalized weakness and feelings of dehydration.  Denies any cough, chest pain or shortness of breath.  On exam patient is alert and oriented x4.  No neurological deficits noted.  Abdomen is soft, nontender nondistended.  Work-up here significant for creatinine of 2 which is elevated from 2.9 last week.  EKG initially showed sinus rhythm, she  was hypotensive to 70s to 80s systolic.  She denies any bloody stools or bloody vomiting.  Chest x-ray shows possible left-sided aspiration versus atelectasis versus infiltrate although patient denies any respiratory symptoms.  She does report some cough when she has her dry heaving and vomiting.  CT of the abdomen pelvis shows findings consistent with enteritis.  No obstruction noted.  Patient's blood pressure improved to 90s systolic with IV fluids.  Hypokalemia of 2.8 which was repleted IV.  She has a normal lactic acid level.  Blood cultures were obtained.  Magnesium level of 1.6.  Blood pressure has improved to 90s systolic with IV fluids given.  She continues to [facility] well.  Suspect that her symptoms are due to her AKI in the setting of decreased p.o. intake due to her vomiting and diarrhea.  Her repeat EKG shows A. fib, but due to her low blood pressure did give any rate control medications.  This improved with IV fluids to the 110s.  Will need to be admitted to hospitalist service for ongoing management of her AKI. Patient discussed with and seen by the attending, Dr. Effie Shy and Dr. Juleen China.    Portions of this note were generated with Scientist, clinical (histocompatibility and immunogenetics). Dictation errors may occur despite best attempts at proofreading.  Final Clinical Impression(s) / ED Diagnoses Final diagnoses:  AKI (acute kidney injury) (HCC)  Diarrhea, unspecified type  Dehydration    Rx / DC Orders ED Discharge Orders    None       Dietrich Pates, Cordelia Poche 08/13/19 2207    Mancel Bale, MD 08/18/19 2035

## 2019-08-13 NOTE — ED Notes (Signed)
Pt vomited x1.  

## 2019-08-13 NOTE — ED Notes (Signed)
Reported CBG of 106 and intermittent nausea to EDP

## 2019-08-13 NOTE — ED Notes (Signed)
Verified k+ can run with D5 half normal saline with Barbara Cower, Pharmd.

## 2019-08-13 NOTE — ED Notes (Signed)
NS bolus hung for low bp

## 2019-08-13 NOTE — ED Provider Notes (Signed)
  Face-to-face evaluation   History: Patient with chronic diarrhea, worsening over the last 4 weeks making it difficult to take her usual oral medicines because of coincident onset of frequent episodes of vomiting.  She is not seen any blood in the emesis or stool.  She contacted her GI doctor who recommended that she have a CT scan done.  She is also had some intermittent pains in her arms and legs bilaterally.  Physical exam: Alert, elderly female.  She is alert and cooperative.  She is nontoxic in appearance.  Abdomen soft and nontender to palpation.  No respiratory distress.  Medical screening examination/treatment/procedure(s) were conducted as a shared visit with non-physician practitioner(s) and myself.  I personally evaluated the patient during the encounter    Mancel Bale, MD 08/18/19 2035

## 2019-08-13 NOTE — ED Notes (Signed)
Holding PO K+ until pt is no longer nauseated.

## 2019-08-14 ENCOUNTER — Inpatient Hospital Stay (HOSPITAL_COMMUNITY): Payer: Medicare PPO

## 2019-08-14 DIAGNOSIS — R579 Shock, unspecified: Secondary | ICD-10-CM | POA: Insufficient documentation

## 2019-08-14 DIAGNOSIS — E860 Dehydration: Secondary | ICD-10-CM | POA: Insufficient documentation

## 2019-08-14 DIAGNOSIS — R197 Diarrhea, unspecified: Secondary | ICD-10-CM | POA: Insufficient documentation

## 2019-08-14 LAB — BASIC METABOLIC PANEL
Anion gap: 13 (ref 5–15)
Anion gap: 10 (ref 5–15)
BUN: 33 mg/dL — ABNORMAL HIGH (ref 8–23)
BUN: 28 mg/dL — ABNORMAL HIGH (ref 8–23)
CO2: 13 mmol/L — ABNORMAL LOW (ref 22–32)
CO2: 16 mmol/L — ABNORMAL LOW (ref 22–32)
Calcium: 6.5 mg/dL — ABNORMAL LOW (ref 8.9–10.3)
Calcium: 6.9 mg/dL — ABNORMAL LOW (ref 8.9–10.3)
Chloride: 104 mmol/L (ref 98–111)
Chloride: 103 mmol/L (ref 98–111)
Creatinine, Ser: 1.37 mg/dL — ABNORMAL HIGH (ref 0.44–1.00)
Creatinine, Ser: 1.16 mg/dL — ABNORMAL HIGH (ref 0.44–1.00)
GFR calc Af Amer: 43 mL/min — ABNORMAL LOW (ref >60)
GFR calc Af Amer: 53 mL/min — ABNORMAL LOW (ref >60)
GFR calc non Af Amer: 37 mL/min — ABNORMAL LOW (ref >60)
GFR calc non Af Amer: 45 mL/min — ABNORMAL LOW (ref >60)
Glucose, Bld: 159 mg/dL — ABNORMAL HIGH (ref 70–99)
Glucose, Bld: 273 mg/dL — ABNORMAL HIGH (ref 70–99)
Potassium: 3.5 mmol/L (ref 3.5–5.1)
Potassium: 4 mmol/L (ref 3.5–5.1)
Sodium: 130 mmol/L — ABNORMAL LOW (ref 135–145)
Sodium: 129 mmol/L — ABNORMAL LOW (ref 135–145)

## 2019-08-14 LAB — I-STAT ARTERIAL BLOOD GAS, ED
Acid-base deficit: 10 mmol/L — ABNORMAL HIGH (ref 0.0–2.0)
Bicarbonate: 14 mmol/L — ABNORMAL LOW (ref 20.0–28.0)
Calcium, Ion: 0.98 mmol/L — ABNORMAL LOW (ref 1.15–1.40)
HCT: 31 % — ABNORMAL LOW (ref 36.0–46.0)
Hemoglobin: 10.5 g/dL — ABNORMAL LOW (ref 12.0–15.0)
O2 Saturation: 91 %
Patient temperature: 97.8
Potassium: 4 mmol/L (ref 3.5–5.1)
Sodium: 133 mmol/L — ABNORMAL LOW (ref 135–145)
TCO2: 15 mmol/L — ABNORMAL LOW (ref 22–32)
pCO2 arterial: 25.6 mmHg — ABNORMAL LOW (ref 32.0–48.0)
pH, Arterial: 7.344 — ABNORMAL LOW (ref 7.350–7.450)
pO2, Arterial: 62 mmHg — ABNORMAL LOW (ref 83.0–108.0)

## 2019-08-14 LAB — URINALYSIS, ROUTINE W REFLEX MICROSCOPIC
Bilirubin Urine: NEGATIVE
Glucose, UA: 150 mg/dL — AB
Hgb urine dipstick: NEGATIVE
Ketones, ur: 5 mg/dL — AB
Leukocytes,Ua: NEGATIVE
Nitrite: NEGATIVE
Protein, ur: NEGATIVE mg/dL
Specific Gravity, Urine: 1.011 (ref 1.005–1.030)
pH: 5 (ref 5.0–8.0)

## 2019-08-14 LAB — CBC
HCT: 30.6 % — ABNORMAL LOW (ref 36.0–46.0)
Hemoglobin: 10.3 g/dL — ABNORMAL LOW (ref 12.0–15.0)
MCH: 34.2 pg — ABNORMAL HIGH (ref 26.0–34.0)
MCHC: 33.7 g/dL (ref 30.0–36.0)
MCV: 101.7 fL — ABNORMAL HIGH (ref 80.0–100.0)
Platelets: 228 10*3/uL (ref 150–400)
RBC: 3.01 MIL/uL — ABNORMAL LOW (ref 3.87–5.11)
RDW: 12.3 % (ref 11.5–15.5)
WBC: 5.3 10*3/uL (ref 4.0–10.5)
nRBC: 0 % (ref 0.0–0.2)

## 2019-08-14 LAB — MAGNESIUM: Magnesium: 2 mg/dL (ref 1.7–2.4)

## 2019-08-14 LAB — MRSA PCR SCREENING: MRSA by PCR: NEGATIVE

## 2019-08-14 LAB — PHOSPHORUS: Phosphorus: 1.6 mg/dL — ABNORMAL LOW (ref 2.5–4.6)

## 2019-08-14 MED ORDER — NOREPINEPHRINE 4 MG/250ML-% IV SOLN
2.0000 ug/min | INTRAVENOUS | Status: DC
  Administered 2019-08-14: 2 ug/min via INTRAVENOUS
  Filled 2019-08-14 (×2): qty 250

## 2019-08-14 MED ORDER — POTASSIUM PHOSPHATES 15 MMOLE/5ML IV SOLN
30.0000 mmol | Freq: Once | INTRAVENOUS | Status: AC
  Administered 2019-08-14: 30 mmol via INTRAVENOUS
  Filled 2019-08-14: qty 10

## 2019-08-14 MED ORDER — SODIUM CHLORIDE 0.9 % IV SOLN
1.0000 mg/kg/h | INTRAVENOUS | Status: DC
  Administered 2019-08-14 – 2019-08-15 (×3): 1 mg/kg/h via INTRAVENOUS
  Filled 2019-08-14 (×5): qty 100

## 2019-08-14 MED ORDER — LACTATED RINGERS IV BOLUS
1000.0000 mL | Freq: Once | INTRAVENOUS | Status: AC
  Administered 2019-08-14: 1000 mL via INTRAVENOUS

## 2019-08-14 MED ORDER — SODIUM CHLORIDE 0.9 % IV SOLN
1.5000 g | Freq: Three times a day (TID) | INTRAVENOUS | Status: DC
  Administered 2019-08-14 – 2019-08-16 (×5): 1.5 g via INTRAVENOUS
  Filled 2019-08-14: qty 4
  Filled 2019-08-14 (×2): qty 1.5
  Filled 2019-08-14 (×4): qty 4
  Filled 2019-08-14: qty 1.5

## 2019-08-14 MED ORDER — METHYLPREDNISOLONE SODIUM SUCC 125 MG IJ SOLR
60.0000 mg | Freq: Two times a day (BID) | INTRAMUSCULAR | Status: DC
  Administered 2019-08-15 (×2): 60 mg via INTRAVENOUS
  Filled 2019-08-14 (×2): qty 2

## 2019-08-14 MED ORDER — SODIUM CHLORIDE 0.9 % IV SOLN
250.0000 mL | INTRAVENOUS | Status: DC
  Administered 2019-08-14 (×2): 250 mL via INTRAVENOUS

## 2019-08-14 MED ORDER — METOCLOPRAMIDE HCL 5 MG/ML IJ SOLN
5.0000 mg | Freq: Four times a day (QID) | INTRAMUSCULAR | Status: AC
  Administered 2019-08-14 – 2019-08-16 (×12): 5 mg via INTRAVENOUS
  Filled 2019-08-14 (×12): qty 2

## 2019-08-14 MED ORDER — MAGNESIUM SULFATE 2 GM/50ML IV SOLN
2.0000 g | Freq: Once | INTRAVENOUS | Status: AC
  Administered 2019-08-14: 2 g via INTRAVENOUS
  Filled 2019-08-14: qty 50

## 2019-08-14 MED ORDER — LORAZEPAM 2 MG/ML IJ SOLN
1.0000 mg | Freq: Once | INTRAMUSCULAR | Status: AC | PRN
  Administered 2019-08-14: 1 mg via INTRAVENOUS
  Filled 2019-08-14: qty 1

## 2019-08-14 MED ORDER — IPRATROPIUM-ALBUTEROL 0.5-2.5 (3) MG/3ML IN SOLN
3.0000 mL | RESPIRATORY_TRACT | Status: DC | PRN
  Administered 2019-08-15: 3 mL via RESPIRATORY_TRACT
  Filled 2019-08-14: qty 3

## 2019-08-14 MED ORDER — CHLORHEXIDINE GLUCONATE CLOTH 2 % EX PADS
6.0000 | MEDICATED_PAD | Freq: Every day | CUTANEOUS | Status: DC
  Administered 2019-08-14 – 2019-08-23 (×6): 6 via TOPICAL

## 2019-08-14 NOTE — Progress Notes (Signed)
Pharmacy Antibiotic Note  Brenda [name] is a 79 y.o. female admitted on 08/13/2019 with hypotension and nausea/vomiting.   There is a concern for aspiration PNA and pharmacy has been consulted for Uasyn dosing. -WBC= 5.3, afebrile, blood cultures- ngtd -SCr= 1.16, CrCl ~ 30 -Wt= 45kg  Plan: -Unasyn 1.5gm IV q8h -Will follow renal function, cultures and clinical progress   Height: 5\' 4"  (162.6 cm) Weight: 45.4 kg (100 lb) IBW/kg (Calculated) : 54.7  Temp (24hrs), Avg:98 F (36.7 C), Min:97.8 F (36.6 C), Max:98.2 F (36.8 C)  Recent Labs  Lab 08/13/19 0017 08/13/19 1447 08/13/19 1503 08/14/19 0500  WBC  --  7.7  --  5.3  CREATININE 1.37* 2.04*  --  1.16*  LATICACIDVEN  --   --  1.4  --     Estimated Creatinine Clearance: 29.1 mL/min (A) (by C-G formula based on SCr of 1.16 mg/dL (H)).    Allergies  Allergen Reactions  . Diclofenac Other (See Comments)    Stomach burning  . Macrodantin [Nitrofurantoin Macrocrystal] Itching, Nausea And Vomiting, Swelling and Rash    Tongue swelling  . Tramadol Other (See Comments)    Stomach burning  . Codeine Nausea And Vomiting  . Topamax [Topiramate] Other (See Comments)    Loss of taste, fatigue    Antimicrobials this admission: 7/27 urine   Microbiology results: 7/26 blood x2 7/27 urine  Thank you for allowing pharmacy to be a part of this patient's care.  8/27, PharmD Clinical Pharmacist **Pharmacist phone directory can now be found on [email or web address] ([email or web address]).  Listed under [facility].

## 2019-08-14 NOTE — Progress Notes (Signed)
eLink Physician-Brief Progress Note Patient Name: Brenda [name] DOB: 12/10/1940 MRN: [id number]   Date of Service  08/14/2019  HPI/Events of Note  Notified of tachycardia 140s and hypoxemia. Patient nauseated and vomited after xanax given. Wheezing as per bedside exam. IVF at 100/hour and has received 4 liters prior  eICU Interventions  Ordered one time dose of ativan 1 mg CXR stat Ipratropium nebs     Intervention Category Major Interventions: Hypoxemia - evaluation and management;Arrhythmia - evaluation and management;Hypertension - evaluation and management Minor Interventions: Agitation / anxiety - evaluation and management  Darl Pikes 08/14/2019, 10:25 PM

## 2019-08-14 NOTE — ED Notes (Signed)
Pt resting comfortably in bed with no acute distress noted.  

## 2019-08-14 NOTE — ED Notes (Signed)
RT notified for ABG

## 2019-08-14 NOTE — ED Notes (Signed)
RT at bedside.

## 2019-08-14 NOTE — Progress Notes (Signed)
Brief Progress Note [facility] consult called 08/14/3030 at 15:25 pm  Bevelyn Ngo, MSN, AGACNP-BC [facility] Pulmonary/Critical Care Medicine See Amion for personal pager PCCM on call pager [phone] 08/14/2019 3:26 PM

## 2019-08-14 NOTE — Consult Note (Signed)
NAME:  Brenda [name], MRN:  [id number], DOB:  [birth year], LOS: 1 ADMISSION DATE:  08/13/2019, CONSULTATION DATE:  08/14/19 REFERRING MD:  Gerri Lins  CHIEF COMPLAINT:  Hypotension   Brief History   Brenda [name] is a 79 y.o. female who was admitted 7/27 with hypotension 2/2 intractable nausea, vomiting, diarrhea.  History of present illness   Brenda [name] is a 79 y.o. female who has a PMH as outlined below.  She presented to [facility] ED 7/27 with intractable nausea/vomiting/diarrhea x 4 weeks.  Symptoms worsened over the past week.  She has chronic diarrhea x several years.  She has seen Dr. Evette Cristal at [facility]; however, when asked what she is being treated for, she states "they don't really know what it is".   Denies any recent fevers/chills/sweats.  No exposures to recent abx.  She was admitted by [facility]; however, remained hypotensive despite 3L IVF.  She was subsequently started on norepinephrine and PCCM was asked to see in consultation.  CT abd / pelv was neg for any acute issues.  Upper GI series pending as is GI pathogen panel.  Past Medical History  has GERD (gastroesophageal reflux disease) s/p Nissen fundoplication 03/26/2015; Paraesophageal hiatal hernia s/p repair w biologic mesh 03/26/2015; AKI (acute kidney injury) (HCC); Hiatal hernia with obstruction but no gangrene; Nausea vomiting and diarrhea; Volume depletion; Hypotension; Enteritis; and Volume depletion, gastrointestinal loss on their problem list.  Significant Hospital Events   7/27 > admit.  Consults:  GI pending.  Procedures:  None.  Significant Diagnostic Tests:  CT abd / pelv 7/26 > enteritis, no obstruction, moderate hiatal hernia. UGI series 7/27 >   Micro Data:  COVID 7/26 > neg. GI PCR 7/26 >  Blood 7/26 >  Urine 7/26 >   Antimicrobials:  None.   Interim history/subjective:  BP improved on 4 mcg/kg/min levophed.  Objective:  Blood pressure (!) 98/55, pulse (!) 106, temperature 97.8 F (36.6 C), temperature  source Oral, resp. rate 18, height 5\' 4"  (1.626 m), weight 45.4 kg, SpO2 97 %.        Intake/Output Summary (Last 24 hours) at 08/14/2019 0134 Last data filed at 08/14/2019 0123 Gross per 24 hour  Intake 3000 ml  Output 0 ml  Net 3000 ml   Filed Weights   08/13/19 1429  Weight: 45.4 kg    Examination: General: Adult female, in NAD. Neuro: A&O x 3, no deficits. HEENT: [place]/AT. Sclerae anicteric.  EOMI. Cardiovascular: RRR, no M/R/G.  Lungs: Respirations even and unlabored.  CTA bilaterally, No W/R/R.  Abdomen: BS x 4, soft, NT/ND.  Musculoskeletal: No gross deformities, no edema.  Skin: Intact, warm, no rashes.  Assessment & Plan:   Hypotension - presumed hypovolemic 2/2 intractable nausea/vomiting/diarrhea.  Doubt any infectious source. - Additional 1L LR bolus now then continue MIVF. - OK to continue low dose levo for now.  Intractable nausea/vomiting/diarrhea. Recurrent hiatal hernia - despite hx of nissen fundoplication in 2017. - Continue zofran. - Add reglan. - F/u on UGI series. - GI consult in AM (Dr. 2018).  Enteritis. - F/u on GI panel.  - Defer abx as doubt infectious source.  AKI. Hypokalemia and hypomagnesemia - 2/2 vomiting/diarrhea.  S/p repletion. - Continue supportive care. - Replete electrolytes as indicated. - Follow BMP.  Hx HTN, HLD. - Hold home atorvastatin, lisinopril, verapamil.  Hx anxiety. - Continue home alprazolam.  Best Practice:  Diet: NPO. Pain/Anxiety/Delirium protocol (if indicated): N/A. VAP protocol (if indicated): N/A. DVT prophylaxis:  SCD's only. GI prophylaxis: N/A. Glucose control: N/A. Mobility: Bedrest. Code Status: Full. Family Communication: None. Disposition: ICU.  Labs   CBC: Recent Labs  Lab 08/13/19 1447  WBC 7.7  NEUTROABS 5.6  HGB 11.9*  HCT 36.3  MCV 102.3*  PLT 206   Basic Metabolic Panel: Recent Labs  Lab 08/13/19 0017 08/13/19 1447 08/13/19 1558  NA 130* 134*  --   K 3.5 2.8*  --    CL 104 103  --   CO2 13* 14*  --   GLUCOSE 159* 90  --   BUN 33* 46*  --   CREATININE 1.37* 2.04*  --   CALCIUM 6.5* 6.5*  --   MG  --   --  1.6*   GFR: Estimated Creatinine Clearance: 16.6 mL/min (A) (by C-G formula based on SCr of 2.04 mg/dL (H)). Recent Labs  Lab 08/13/19 1447 08/13/19 1503  WBC 7.7  --   LATICACIDVEN  --  1.4   Liver Function Tests: Recent Labs  Lab 08/13/19 1447  AST 29  ALT 17  ALKPHOS 83  BILITOT 1.2  PROT 5.8*  ALBUMIN 2.5*   Recent Labs  Lab 08/13/19 1447  LIPASE 32   No results for input(s): AMMONIA in the last 168 hours. ABG No results found for: PHART, PCO2ART, PO2ART, HCO3, TCO2, ACIDBASEDEF, O2SAT  Coagulation Profile: No results for input(s): INR, PROTIME in the last 168 hours. Cardiac Enzymes: No results for input(s): CKTOTAL, CKMB, CKMBINDEX, TROPONINI in the last 168 hours. HbA1C: Hgb A1c MFr Bld  Date/Time Value Ref Range Status  03/27/2015 04:01 AM 6.3 (H) 4.8 - 5.6 % Final    Comment:    (NOTE)         Pre-diabetes: 5.7 - 6.4         Diabetes: >6.4         Glycemic control for adults with diabetes: <7.0    CBG: Recent Labs  Lab 08/13/19 1509 08/13/19 1653  GLUCAP 74 106*    Review of Systems:   All negative; except for those that are bolded, which indicate positives.  Constitutional: weight loss, weight gain, night sweats, fevers, chills, fatigue, weakness.  HEENT: headaches, sore throat, sneezing, nasal congestion, post nasal drip, difficulty swallowing, tooth/dental problems, visual complaints, visual changes, ear aches. Neuro: difficulty with speech, weakness, numbness, ataxia. CV:  chest pain, orthopnea, PND, swelling in lower extremities, dizziness, palpitations, syncope.  Resp: cough, hemoptysis, dyspnea, wheezing. GI: heartburn, indigestion, abdominal pain, nausea, vomiting, diarrhea, constipation, change in bowel habits, loss of appetite, hematemesis, melena, hematochezia.  GU: dysuria, change in color  of urine, urgency or frequency, flank pain, hematuria. MSK: joint pain or swelling, decreased range of motion. Psych: change in mood or affect, depression, anxiety, suicidal ideations, homicidal ideations. Skin: rash, itching, bruising.   Past medical history  She,  has a past medical history of Anxiety, Chronic diarrhea, Coarse tremors, Colon polyp, DDD (degenerative disc disease), cervical, Hiatal hernia, History of skin cancer, Hypercholesteremia, Hypertension, Migraines, Osteoporosis, Shortness of breath dyspnea, Tremor, and Weight loss.   Surgical History    Past Surgical History:  Procedure Laterality Date  . ESOPHAGEAL MANOMETRY N/A 01/15/2015   Procedure: ESOPHAGEAL MANOMETRY (EM);  Surgeon: Graylin Shiver, MD;  Location: [facility] ENDOSCOPY;  Service: Endoscopy;  Laterality: N/A;  . HIATAL HERNIA REPAIR    . KYPHOPLASTY N/A 12/22/2017   Procedure: LUMBAR TWO KYPHOPLASTY;  Surgeon: Estill Bamberg, MD;  Location: [facility] OR;  Service: Orthopedics;  Laterality: N/A;  LUMBAR TWO KYPHOPLASTY  . RHINOPLASTY    . SINUS SURGERY WITH INSTATRAK       Social History   reports that she quit smoking about 30 years ago. She has never used smokeless tobacco. She reports current alcohol use. She reports that she does not use drugs.   Family history   Her family history includes COPD in her father; Hypertension in her brother; Lung cancer in her mother.   Allergies Allergies  Allergen Reactions  . Diclofenac Other (See Comments)    Stomach burning  . Macrodantin [Nitrofurantoin Macrocrystal] Itching, Nausea And Vomiting, Swelling and Rash    Tongue swelling  . Tramadol Other (See Comments)    Stomach burning  . Codeine Nausea And Vomiting  . Topamax [Topiramate] Other (See Comments)    Loss of taste, fatigue     Home meds  Prior to Admission medications   Medication Sig Start Date End Date Taking? Authorizing Provider  ALPRAZolam Prudy Feeler) 0.25 MG tablet Take 0.25 mg by mouth daily as needed  (tremors).  01/20/17  Yes [provider]  Apoaequorin (PREVAGEN PO) Take 1 capsule by mouth daily.   Yes [provider]  atorvastatin (LIPITOR) 20 MG tablet Take 20 mg by mouth every Monday, Wednesday, and Friday.    Yes [provider]  budesonide (ENTOCORT EC) 3 MG 24 hr capsule Take 9 mg by mouth daily before breakfast.   Yes [provider]  cholestyramine (QUESTRAN) 4 g packet Take 1 packet by mouth daily. 08/01/19  Yes [provider]  Eluxadoline (VIBERZI) 100 MG TABS Take 100 mg by mouth 2 (two) times daily.    Yes [provider]  famotidine (PEPCID) 40 MG tablet Take 40 mg by mouth daily. 07/07/19  Yes [provider]  ibuprofen (ADVIL) 200 MG tablet Take 200 mg by mouth daily as needed for headache (pain).   Yes [provider]  lisinopril (PRINIVIL,ZESTRIL) 10 MG tablet Take 10 mg by mouth daily. 08/24/17  Yes [provider]  Multiple Vitamins-Minerals (PRESERVISION AREDS 2) CAPS Take 1 capsule by mouth daily.   Yes [provider]  primidone (MYSOLINE) 50 MG tablet Take 2 tablets (100 mg total) by mouth 3 (three) times daily. 04/04/19  Yes Butch Penny, NP  promethazine (PHENERGAN) 25 MG tablet Take 25 mg by mouth 5 (five) times daily as needed for nausea or vomiting.  08/10/19  Yes [provider]  verapamil (CALAN-SR) 240 MG CR tablet Take 240 mg by mouth daily.  02/25/15  Yes [provider]  Vitamin D, Ergocalciferol, (DRISDOL) 1.25 MG (50000 UNIT) CAPS capsule Take 50,000 Units by mouth every 7 (seven) days.   Yes [provider]    Critical care time: 35 min.    Rutherford Guys, [place] [facility] 08/14/2019, 1:34 AM

## 2019-08-14 NOTE — ED Notes (Signed)
Pt called out stating she was having a "panic attack", also states "I just don't feel right". Pt offered xanax to assist with anxiety. Pt accepted stating that it may help her sleep.

## 2019-08-14 NOTE — ED Notes (Signed)
Orthostatics not performed at this time due to patient vital signs

## 2019-08-14 NOTE — ED Notes (Signed)
Taken to imaging for upper GI study,

## 2019-08-14 NOTE — Progress Notes (Signed)
Discussed with Dr. Evette Cristal, she has a history of chronic collagenous colitis and is on oral budesonide Also has moderate hiatal hernia. Discussed CT and upper GI findings We will correct hypokalemia and electrolyte abnormalities while checking for other GI pathogens  Mayrin Schmuck V. Vassie Loll MD

## 2019-08-15 ENCOUNTER — Inpatient Hospital Stay (HOSPITAL_COMMUNITY): Payer: Medicare PPO

## 2019-08-15 DIAGNOSIS — K449 Diaphragmatic hernia without obstruction or gangrene: Secondary | ICD-10-CM

## 2019-08-15 DIAGNOSIS — I9589 Other hypotension: Secondary | ICD-10-CM

## 2019-08-15 DIAGNOSIS — K440 Diaphragmatic hernia with obstruction, without gangrene: Secondary | ICD-10-CM

## 2019-08-15 DIAGNOSIS — N179 Acute kidney failure, unspecified: Secondary | ICD-10-CM

## 2019-08-15 DIAGNOSIS — R197 Diarrhea, unspecified: Secondary | ICD-10-CM

## 2019-08-15 DIAGNOSIS — E861 Hypovolemia: Secondary | ICD-10-CM

## 2019-08-15 DIAGNOSIS — R112 Nausea with vomiting, unspecified: Secondary | ICD-10-CM

## 2019-08-15 DIAGNOSIS — E43 Unspecified severe protein-calorie malnutrition: Secondary | ICD-10-CM | POA: Insufficient documentation

## 2019-08-15 DIAGNOSIS — K529 Noninfective gastroenteritis and colitis, unspecified: Secondary | ICD-10-CM

## 2019-08-15 LAB — URINE CULTURE: Culture: NO GROWTH

## 2019-08-15 LAB — BASIC METABOLIC PANEL
Anion gap: 8 (ref 5–15)
BUN: 17 mg/dL (ref 8–23)
CO2: 18 mmol/L — ABNORMAL LOW (ref 22–32)
Calcium: 10.1 mg/dL (ref 8.9–10.3)
Chloride: 106 mmol/L (ref 98–111)
Creatinine, Ser: 0.95 mg/dL (ref 0.44–1.00)
GFR calc Af Amer: >60 mL/min (ref >60)
GFR calc non Af Amer: 58 mL/min — ABNORMAL LOW (ref >60)
Glucose, Bld: 198 mg/dL — ABNORMAL HIGH (ref 70–99)
Potassium: 6.1 mmol/L — ABNORMAL HIGH (ref 3.5–5.1)
Sodium: 132 mmol/L — ABNORMAL LOW (ref 135–145)

## 2019-08-15 LAB — POTASSIUM
Potassium: 4.9 mmol/L (ref 3.5–5.1)
Potassium: 5.4 mmol/L — ABNORMAL HIGH (ref 3.5–5.1)

## 2019-08-15 LAB — CBC
HCT: 32.7 % — ABNORMAL LOW (ref 36.0–46.0)
Hemoglobin: 10.8 g/dL — ABNORMAL LOW (ref 12.0–15.0)
MCH: 33.1 pg (ref 26.0–34.0)
MCHC: 33 g/dL (ref 30.0–36.0)
MCV: 100.3 fL — ABNORMAL HIGH (ref 80.0–100.0)
Platelets: 198 10*3/uL (ref 150–400)
RBC: 3.26 MIL/uL — ABNORMAL LOW (ref 3.87–5.11)
RDW: 12.1 % (ref 11.5–15.5)
WBC: 7 10*3/uL (ref 4.0–10.5)
nRBC: 0 % (ref 0.0–0.2)

## 2019-08-15 LAB — GLUCOSE, CAPILLARY: Glucose-Capillary: 244 mg/dL — ABNORMAL HIGH (ref 70–99)

## 2019-08-15 LAB — MAGNESIUM: Magnesium: 1.5 mg/dL — ABNORMAL LOW (ref 1.7–2.4)

## 2019-08-15 LAB — PHOSPHORUS: Phosphorus: 4 mg/dL (ref 2.5–4.6)

## 2019-08-15 MED ORDER — ADULT MULTIVITAMIN W/MINERALS CH
1.0000 | ORAL_TABLET | Freq: Every day | ORAL | Status: DC
  Administered 2019-08-15 – 2019-08-23 (×10): 1 via ORAL
  Filled 2019-08-15 (×9): qty 1

## 2019-08-15 MED ORDER — MAGNESIUM SULFATE 2 GM/50ML IV SOLN
2.0000 g | Freq: Once | INTRAVENOUS | Status: AC
  Administered 2019-08-15: 2 g via INTRAVENOUS
  Filled 2019-08-15: qty 50

## 2019-08-15 MED ORDER — LACTATED RINGERS IV SOLN: INTRAVENOUS | Status: DC

## 2019-08-15 MED ORDER — BOOST / RESOURCE BREEZE PO LIQD CUSTOM
1.0000 | Freq: Three times a day (TID) | ORAL | Status: DC
  Administered 2019-08-15 – 2019-08-23 (×10): 1 via ORAL

## 2019-08-15 MED ORDER — VERAPAMIL HCL ER 240 MG PO TBCR
240.0000 mg | EXTENDED_RELEASE_TABLET | Freq: Every day | ORAL | Status: DC
  Administered 2019-08-15 – 2019-08-22 (×8): 240 mg via ORAL
  Filled 2019-08-15 (×10): qty 1

## 2019-08-15 MED ORDER — FUROSEMIDE 10 MG/ML IJ SOLN
40.0000 mg | Freq: Every day | INTRAMUSCULAR | Status: DC
  Administered 2019-08-15: 40 mg via INTRAVENOUS
  Filled 2019-08-15: qty 4

## 2019-08-15 MED ORDER — FUROSEMIDE 10 MG/ML IJ SOLN
40.0000 mg | Freq: Every day | INTRAMUSCULAR | Status: AC
  Administered 2019-08-16: 40 mg via INTRAVENOUS
  Filled 2019-08-15: qty 4

## 2019-08-15 MED ORDER — DEXTROSE 50 % IV SOLN
1.0000 | Freq: Once | INTRAVENOUS | Status: AC
  Administered 2019-08-15: 50 mL via INTRAVENOUS
  Filled 2019-08-15: qty 50

## 2019-08-15 MED ORDER — MAGNESIUM SULFATE 50 % IJ SOLN: 3.0000 g | Freq: Once | INTRAVENOUS | Status: DC

## 2019-08-15 MED ORDER — INSULIN ASPART 100 UNIT/ML IV SOLN
5.0000 [IU] | Freq: Once | INTRAVENOUS | Status: AC
  Administered 2019-08-15: 5 [IU] via INTRAVENOUS

## 2019-08-15 NOTE — Progress Notes (Addendum)
PROGRESS NOTE  Brenda [name] ZOX:[id number] DOB: March 24, 1940 DOA: 08/13/2019 PCP: Clovis RileyMitchell, L.August Saucerean, MD   LOS: 2 days   Brief narrative: As per HPI,  Brenda SheehanBonnie W Halter is a 79 y.o. female  with past medical history of GERD status post Nissen fundoplication, paraesophageal hiatal hernia repair, acute kidney injury, enteritis, presented to hospital on 08/14/2019 with complaints of intractable nausea vomiting and diarrhea for 4 weeks.   Patient was being treated by Dr. [facility] as [facility].  During this admission patient subsequently remained hypotensive despite 3 L of IV fluid and was started on Levophed and PCCM was consulted.   Patient remained in the ICU for vasopressors and was subsequently weaned off vasopressors.  She was then transferred out of the ICU.  CT abd / pelvis was negative for any acute issues.    Assessment/Plan:  Principal Problem:   Volume depletion Active Problems:   Paraesophageal hiatal hernia s/p repair w biologic mesh 03/26/2015   AKI (acute kidney injury) (HCC)   Hiatal hernia with obstruction but no gangrene   Nausea vomiting and diarrhea   Hypotension   Enteritis   Volume depletion, gastrointestinal loss   Hypotension -presumed to be hypovolemic secondary to intractable nausea, vomiting and diarrhea.  Received IV fluids/ vasopressors.  At this time, blood pressure has improved.  On Solu-Medrol IV every 12hrly.  Shortness of breath, cough.  Patient is on Unasyn.  Will add chest x-ray today.  Might consider 1 dose of IV Lasix as well.  Chest x-ray done yesterday showed increasing bibasilar opacity with left-sided effusion.  Possibility of aspiration pneumonia.  Currently on 2 L of nasal cannula.  We will get a speech and swallow evaluation.  The family states that she occasionally has swallow issues.  Intractable nausea/vomiting/diarrhea.  History of collagenous colitis.  Recurrent hiatal hernia - despite hx of nissen fundoplication in 2017.  As needed Zofran.   GI on board.  Will try clears today.  UGI series with KUB -limited study.  Patient was on budesonide and Viberzi as outpatient.  Currently on IV Solu-Medrol.  GI on board.    Enteritis. - F/u on GI panel and C. difficile study.  Has not had a bowel movement today. Seen by GI today.  AKI.  Likely secondary to volume depletion.  Has improved at this time.  Check BMP in a.m.  Hypokalemia and hypomagnesemia -secondary to vomiting/diarrhea.  S/p repletion. Check BMP magnesium, phosphorus in a.m. magnesium sulfate today.  Hyponatremia.  Mild.  Sodium 132 today.  History of HTN, HLD. Continue to hold home atorvastatin, lisinopril, verapamil.  Blood pressure.  Hx anxiety. - Continue home alprazolam.  Deconditioning, debility, ambulatory dysfunction.  Patient uses walker at home.  Will get PT evaluation.  Consider out of bed to chair.  DVT prophylaxis: SCDs Start: 08/13/19 1906  Code Status: Full code  Family Communication: Spoke with the patient's husband at bedside and updated him about the clinical condition of the patient.  Status is: Inpatient  Remains inpatient appropriate because:Unsafe d/c plan, IV treatments appropriate due to intensity of illness or inability to take PO and Inpatient level of care appropriate due to severity of illness   Dispo: The patient is from: Home              Anticipated d/c is to: Undetermined at this time.  Will get PT evaluation.              Anticipated d/c date is: 2 days.  Okay  to transfer to telemetry unit.              Patient currently is not medically stable to d/c.   Consultants:  PCCM  GI  Procedures:  None  Antibiotics:  . Unasyn IV  Anti-infectives (From admission, onward)   Start     Dose/Rate Route Frequency Ordered Stop   08/14/19 1730  ampicillin-sulbactam (UNASYN) 1.5 g in sodium chloride 0.9 % 100 mL IVPB     Discontinue     1.5 g 200 mL/hr over 30 Minutes Intravenous Every 8 hours 08/14/19 1636        Subjective: Today, patient was seen and examined at bedside.  Has not had a bowel movement today.  Denies overt abdominal pain.  Feels weak and deconditioned.  N P.O. due to vomiting.  Complains of mild cough and shortness of breath.  Objective: Vitals:   08/15/19 0600 08/15/19 0620  BP: (!) 101/60   Pulse: 99 (!) 113  Resp: 16 19  Temp:    SpO2: 92% (!) 89%    Intake/Output Summary (Last 24 hours) at 08/15/2019 0702 Last data filed at 08/15/2019 0200 Gross per 24 hour  Intake 4586.49 ml  Output 1000 ml  Net 3586.49 ml   Filed Weights   08/13/19 1429 08/15/19 0620  Weight: 45.4 kg 57.7 kg   Body mass index is 21.83 kg/m.   Physical Exam: GENERAL: Patient is alert awake and communicative, not in obvious distress.  Thinly built.  On nasal cannula oxygen HENT: No scleral pallor or icterus. Pupils equally reactive to light. Oral mucosa is moist NECK: is supple, no gross swelling noted. CHEST:   Diminished breath sounds bilaterally. CVS: S1 and S2 heard, no murmur. Regular rate and rhythm.  ABDOMEN: Soft, diffuse tenderness, mild and nonspecific bowel sounds are present. EXTREMITIES: No edema. CNS: Cranial nerves are intact.  Moving all extremities SKIN: warm and dry without rashes.  Data Review: I have personally reviewed the following laboratory data and studies,  CBC: Recent Labs  Lab 08/13/19 1447 08/14/19 0252 08/14/19 0500 08/15/19 0058  WBC 7.7  --  5.3 7.0  NEUTROABS 5.6  --   --   --   HGB 11.9* 10.5* 10.3* 10.8*  HCT 36.3 31.0* 30.6* 32.7*  MCV 102.3*  --  101.7* 100.3*  PLT 206  --  228 198   Basic Metabolic Panel: Recent Labs  Lab 08/13/19 0017 08/13/19 1447 08/13/19 1558 08/14/19 0252 08/14/19 0500 08/15/19 0058  NA 130* 134*  --  133* 129* 132*  K 3.5 2.8*  --  4.0 4.0 6.1*  CL 104 103  --   --  103 106  CO2 13* 14*  --   --  16* 18*  GLUCOSE 159* 90  --   --  273* 198*  BUN 33* 46*  --   --  28* 17  CREATININE 1.37* 2.04*  --   --   1.16* 0.95  CALCIUM 6.5* 6.5*  --   --  6.9* 10.1  MG  --   --  1.6*  --  2.0 1.5*  PHOS  --   --   --   --  1.6* 4.0   Liver Function Tests: Recent Labs  Lab 08/13/19 1447  AST 29  ALT 17  ALKPHOS 83  BILITOT 1.2  PROT 5.8*  ALBUMIN 2.5*   Recent Labs  Lab 08/13/19 1447  LIPASE 32   No results for input(s): AMMONIA in the last 168  hours. Cardiac Enzymes: No results for input(s): CKTOTAL, CKMB, CKMBINDEX, TROPONINI in the last 168 hours. BNP (last 3 results) No results for input(s): BNP in the last 8760 hours.  ProBNP (last 3 results) No results for input(s): PROBNP in the last 8760 hours.  CBG: Recent Labs  Lab 08/13/19 1509 08/13/19 1653  GLUCAP 74 106*   Recent Results (from the past 240 hour(s))  Blood culture (routine x 2)     Status: None (Preliminary result)   Collection Time: 08/13/19  2:54 PM   Specimen: BLOOD RIGHT WRIST  Result Value Ref Range Status   Specimen Description BLOOD RIGHT WRIST  Final   Special Requests   Final    BOTTLES DRAWN AEROBIC AND ANAEROBIC Blood Culture adequate volume   Culture   Final    NO GROWTH < 24 HOURS Performed at [facility], [address] [place], [place] [address]    Report Status PENDING  Incomplete  Blood culture (routine x 2)     Status: None (Preliminary result)   Collection Time: 08/13/19  3:20 PM   Specimen: BLOOD RIGHT HAND  Result Value Ref Range Status   Specimen Description BLOOD RIGHT HAND  Final   Special Requests   Final    BOTTLES DRAWN AEROBIC AND ANAEROBIC Blood Culture results may not be optimal due to an inadequate volume of blood received in culture bottles   Culture   Final    NO GROWTH < 24 HOURS Performed at [facility], [address], [place], [place] [address]    Report Status PENDING  Incomplete  SARS Coronavirus 2 by RT PCR (hospital order, performed in [facility] hospital lab) Nasopharyngeal Nasopharyngeal Swab     Status: None   Collection Time: 08/13/19  7:44 PM    Specimen: Nasopharyngeal Swab  Result Value Ref Range Status   SARS Coronavirus 2 NEGATIVE NEGATIVE Final    Comment: (NOTE) SARS-CoV-2 target nucleic acids are NOT DETECTED.  The SARS-CoV-2 RNA is generally detectable in upper and lower respiratory specimens during the acute phase of infection. The lowest concentration of SARS-CoV-2 viral copies this assay can detect is 250 copies / mL. A negative result does not preclude SARS-CoV-2 infection and should not be used as the sole basis for treatment or other patient management decisions.  A negative result may occur with improper specimen collection / handling, submission of specimen other than nasopharyngeal swab, presence of viral mutation(s) within the areas targeted by this assay, and inadequate number of viral copies (<250 copies / mL). A negative result must be combined with clinical observations, patient history, and epidemiological information.  Fact Sheet for Patients:   BoilerBrush.com.cy  Fact Sheet for Healthcare Providers: https://pope.com/  This test is not yet approved or  cleared by the [place] FDA and has been authorized for detection and/or diagnosis of SARS-CoV-2 by FDA under an Emergency Use Authorization (EUA).  This EUA will remain in effect (meaning this test can be used) for the duration of the COVID-19 declaration under Section 564(b)(1) of the Act, 21 U.S.C. section 360bbb-3(b)(1), unless the authorization is terminated or revoked sooner.  Performed at [facility], [address] [place], [place] [address]   MRSA PCR Screening     Status: None   Collection Time: 08/14/19 10:27 AM   Specimen: Nasopharyngeal  Result Value Ref Range Status   MRSA by PCR NEGATIVE NEGATIVE Final    Comment:  The GeneXpert MRSA Assay (FDA approved for NASAL specimens only), is one component of a comprehensive MRSA colonization surveillance program. It  is not intended to diagnose MRSA infection nor to guide or monitor treatment for MRSA infections. Performed at [facility], [address], [place], [place] [address]      Studies: CT ABDOMEN PELVIS WO CONTRAST  Result Date: 08/13/2019 CLINICAL DATA:  79 year old female with abdominal pain. Concern for acute diverticulitis. EXAM: CT ABDOMEN AND PELVIS WITHOUT CONTRAST TECHNIQUE: Multidetector CT imaging of the abdomen and pelvis was performed following the standard protocol without IV contrast. COMPARISON:  CT abdomen pelvis dated 09/08/2015. FINDINGS: Evaluation of this exam is limited in the absence of intravenous contrast. Lower chest: There is diffuse interstitial and interlobular septal prominence consistent with edema. There are small bilateral pleural effusions. Coronary vascular calcifications noted. There is no intra-abdominal free air or free fluid. Hepatobiliary: The liver is unremarkable. No intrahepatic biliary duct dilatation. The gallbladder is unremarkable. Pancreas: Unremarkable. No pancreatic ductal dilatation or surrounding inflammatory changes. Spleen: Normal in size without focal abnormality. Adrenals/Urinary Tract: The adrenal glands are unremarkable. The kidneys, visualized ureters, and urinary bladder appear unremarkable. Stomach/Bowel: There is mild diffuse mesenteric stranding and edema with inflammatory changes adjacent to a loop of small bowel in the right lower quadrant. Findings may represent enteritis. Clinical correlation is recommended. There is no bowel obstruction. Loose stool noted within the colon compatible with diarrheal state. There is somewhat featureless appearance of the colon which may be related to underlying chronic inflammatory bowel disease. Correlation with history of ulcerative colitis recommended. There is a moderate size hiatal hernia containing a portion of the stomach. The appendix is not visualized with certainty. No inflammatory changes  identified in the right lower quadrant. Vascular/Lymphatic: Advanced aortoiliac atherosclerotic disease. The IVC is unremarkable. No portal venous gas. There is no adenopathy. Reproductive: The uterus and ovaries are grossly unremarkable. Other: None Musculoskeletal: Osteopenia with degenerative changes of the spine. Old L2 compression fracture and vertebroplasty. No acute osseous pathology. IMPRESSION: 1. Findings likely represent enteritis and diarrheal state. Clinical correlation is recommended. No bowel obstruction. 2. Moderate size hiatal hernia containing a portion of the stomach. 3. Aortic Atherosclerosis (ICD10-I70.0). Electronically Signed   By: Elgie Collard M.D.   On: 08/13/2019 18:42   DG Chest 2 View  Result Date: 08/13/2019 CLINICAL DATA:  Weakness EXAM: CHEST - 2 VIEW COMPARISON:  September 28, 2017 FINDINGS: The cardiomediastinal silhouette is unchanged in contour.Atherosclerotic calcifications. No pleural effusion. No pneumothorax. Diffuse interstitial prominence, nonspecific. Mild LEFT basilar heterogeneous opacity. Visualized abdomen is unremarkable. Multilevel degenerative changes of the thoracic spine. Status post kyphoplasty of a lumbar vertebral body. IMPRESSION: Mild LEFT basilar heterogeneous opacity. Differential considerations include aspiration, atelectasis or infection. Electronically Signed   By: Meda Klinefelter MD   On: 08/13/2019 16:09   DG Chest Port 1 View  Result Date: 08/14/2019 CLINICAL DATA:  Hypoxia EXAM: PORTABLE CHEST 1 VIEW COMPARISON:  08/13/2019 FINDINGS: Cardiac shadow is stable. Aortic calcifications are noted. Increasing left basilar infiltrate and effusion is seen. Some increase in right basilar opacity is noted as well. Mild vascular congestion is seen. No bony abnormality is noted. IMPRESSION: Increasing bibasilar airspace opacity with left-sided effusion. Electronically Signed   By: Alcide Clever M.D.   On: 08/14/2019 22:46   DG UGI W SINGLE CM (SOL  OR THIN BA)  Result Date: 08/14/2019 CLINICAL DATA:  Vomiting and regurgitation. EXAM: UPPER GI SERIES WITH KUB TECHNIQUE: After obtaining  a scout radiograph a routine upper GI series was performed using thin barium FLUOROSCOPY TIME:  Fluoroscopy Time:  2 minutes and 48 seconds. Radiation Exposure Index (if provided by the fluoroscopic device): 44.2 mGy Number of Acquired Spot Images: COMPARISON:  Abdomen pelvis CT 08/13/2019.  Esophagram 06/18/2015. FINDINGS: Pre-procedure KUB shows gaseous distention of stomach and colon. Upper lumbar vertebral augmentation evident. Limited study due to patient immobility. Exam was performed with the patient RAO and LPO on the fluoro table positioned at 30 degree head up ankle. Patient was given sips of contrast material from a cup. This demonstrates disruption of primary peristalsis with tertiary contractions in the distal third of the esophagus. Distal esophageal wall appears irregular with a potential tiny distal esophageal diverticulum. As noted on the CT scan, there is a moderate hiatal hernia with approximately 25-50% of the stomach above the diaphragmatic hiatus. Patient is reportedly status post fundoplication which may account for deformity at the gastroesophageal junction. This deformity may also be aggravated by the hiatal hernia. Otherwise no gross gastric mass lesion evident. Gastric emptying is prompt. Duodenal C-loop and ligament of Treitz are normally positioned. IMPRESSION: 1. Markedly limited study due to patient immobility. 2. Disruption of primary peristalsis with tertiary contractions in the distal third of the esophagus. There is some smooth wall irregularity in the distal esophagus, indeterminate. Potential tiny distal esophageal diverticulum. 3. Moderate hiatal hernia with 25-50% of the stomach contained in the chest. Deformity of the esophagogastric junction may be related to fundoplication and aggravated by the hiatal hernia. 4. Prompt gastric emptying  with normal position of the duodenum and ligament of Treitz. 5. Due to the markedly limited nature of this study, follow-up EGD or dedicated upper GI series after patient recovers from acute symptoms recommended. Electronically Signed   By: Kennith Center M.D.   On: 08/14/2019 09:40      Joycelyn Das, MD  [facility] 08/15/2019

## 2019-08-15 NOTE — Progress Notes (Signed)
CRITICAL VALUE ALERT  Critical Value:  Potassium 6.1, magnesium 1.5  Date & Time Notied:  7/28 0300  Provider Notified: [id number]  Orders Received/Actions taken:none yet

## 2019-08-15 NOTE — Progress Notes (Addendum)
Initial Nutrition Assessment  DOCUMENTATION CODES:   Severe malnutrition in context of chronic illness  INTERVENTION:    Boost Breeze po TID, each supplement provides 250 kcal and 9 grams of protein  MVI daily   NUTRITION DIAGNOSIS:   Severe Malnutrition related to chronic illness (colitis) as evidenced by severe fat depletion, severe muscle depletion, energy intake < or equal to 75% for > or equal to 1 month.  GOAL:   Patient will meet greater than or equal to 90% of their needs   MONITOR:   PO intake, Supplement acceptance, Weight trends, Labs, Diet advancement, I & O's  REASON FOR ASSESSMENT:   Malnutrition Screening Tool    ASSESSMENT:   Patient with PMH significant for GERD s/p Nissen fundoplication, chronic diarrhea, paraesophageal hiatal hernia s/p repair, collagenous colitis, and HTN. Presents this admission with nausea, vomiting, diarrhea resulting in dehydration.   Pt sleeping upon RD assessment. Obtained history form husband at bedside. States intake has decreased over the last 4 weeks due to ongoing diarrhea. During this time she consumed two meal daily that consisted of eggs and sherbet. Did not use supplementation. States diarrhea has improved over the last few days and nausea has resolved. Discussed the importance of protein intake for preservation of lean body mass. RD to provide supplementation to maximize kcal and protein this admission.   Husband reports pt's UBW stays around 120 lb and is unsure of wt loss. Records indicate pt weighed 109 lb on 6/17 and 127 this admission. Suspect fluid is masking losses.   Medications: solumedrol, reglan Labs: K 5.4 (H) Mg 1.5 (L)   NUTRITION - FOCUSED PHYSICAL EXAM:    Most Recent Value  Orbital Region Mild depletion  Upper Arm Region Severe depletion  Thoracic and Lumbar Region Unable to assess  Buccal Region Severe depletion  Temple Region Moderate depletion  Clavicle Bone Region Severe depletion  Clavicle and  Acromion Bone Region Severe depletion  Scapular Bone Region Unable to assess  Dorsal Hand Moderate depletion  Patellar Region Severe depletion  Anterior Thigh Region Severe depletion  Posterior Calf Region Severe depletion  Edema (RD Assessment) Mild  Hair Reviewed  Eyes Reviewed  Mouth Reviewed  Skin Reviewed  Nails Reviewed     Diet Order:   Diet Order            Diet clear liquid Room service appropriate? Yes; Fluid consistency: Thin  Diet effective now                 EDUCATION NEEDS:   Not appropriate for education at this time  Skin:  Skin Assessment: Reviewed RN Assessment  Last BM:  7/27  Height:   Ht Readings from Last 1 Encounters:  08/13/19 5\' 4"  (1.626 m)    Weight:   Wt Readings from Last 1 Encounters:  08/15/19 57.7 kg    BMI:  Body mass index is 21.83 kg/m.  Estimated Nutritional Needs:   Kcal:  1600-1800 kcal  Protein:  80-95 grams  Fluid:  >/= 1.6 L/day   08/17/19 RD, LDN Clinical Nutrition Pager listed in AMION

## 2019-08-15 NOTE — Progress Notes (Signed)
eLink Physician-Brief Progress Note Patient Name: [name] DOB: [birth year] MRN: [id number]   Date of Service  08/15/2019  HPI/Events of Note  Notified of K 6.1. patient was on IVF with K incorporation which has since been discontinued  eICU Interventions  Ordered D50/insulin and repeat K     Intervention Category Major Interventions: Electrolyte abnormality - evaluation and management  Darl Pikes 08/15/2019, 4:09 AM

## 2019-08-15 NOTE — Progress Notes (Signed)
Pt. HR sustaining in the 120s. On call for [facility] paged to make aware. New orders received.

## 2019-08-15 NOTE — Consult Note (Signed)
Subjective:   HPI  The patient is a 79 year old female who is known to me.  She has a history of collagenous colitis.  She has had problems over the years with diarrhea which has been controlled with combination of budesonide and Viberzi.  I had not seen her in quite some time in the office until a week or so ago when she presented with complaints of several weeks of nausea vomiting and diarrhea and generalized weakness.  I obtained some outpatient labs showing an elevated white blood cell count.  Her BUN and creatinine were also elevated.  I had told her to come to the hospital for further evaluation because I felt she was getting dehydrated and we needed to further treat and investigate in the hospital setting.  She tells me today that her diarrhea seems to be better.  Review of Systems Denies chest pain or shortness of breath  Past Medical History:  Diagnosis Date  . Anxiety   . Chronic diarrhea   . Coarse tremors   . Colon polyp   . DDD (degenerative disc disease), cervical   . Hiatal hernia   . History of skin cancer   . Hypercholesteremia   . Hypertension   . Migraines   . Osteoporosis   . Shortness of breath dyspnea    with exertion  . Tremor   . Weight loss    Past Surgical History:  Procedure Laterality Date  . ESOPHAGEAL MANOMETRY N/A 01/15/2015   Procedure: ESOPHAGEAL MANOMETRY (EM);  Surgeon: Graylin Shiver, MD;  Location: [facility];  Service: Endoscopy;  Laterality: N/A;  . HIATAL HERNIA REPAIR    . KYPHOPLASTY N/A 12/22/2017   Procedure: LUMBAR TWO KYPHOPLASTY;  Surgeon: Estill Bamberg, MD;  Location: [facility] OR;  Service: Orthopedics;  Laterality: N/A;  LUMBAR TWO KYPHOPLASTY  . RHINOPLASTY    . SINUS SURGERY WITH INSTATRAK     Social History   Socioeconomic History  . Marital status: Married    Spouse name: Not on file  . Number of children: 0  . Years of education: MA  . Highest education level: Not on file  Occupational History  . Occupation: Retired   Tobacco Use  . Smoking status: Former Smoker    Quit date: 09/11/1988    Years since quitting: 30.9  . Smokeless tobacco: Never Used  Vaping Use  . Vaping Use: Never used  Substance and Sexual Activity  . Alcohol use: Yes    Alcohol/week: 0.0 standard drinks    Comment: OCCASIONAL  . Drug use: No  . Sexual activity: Not on file  Other Topics Concern  . Not on file  Social History Narrative   Denies any caffeine use    Social Determinants of Health   Financial Resource Strain:   . Difficulty of Paying Living Expenses:   Food Insecurity:   . Worried About Programme researcher, broadcasting/film/video in the Last Year:   . Barista in the Last Year:   Transportation Needs:   . Freight forwarder (Medical):   Marland Kitchen Lack of Transportation (Non-Medical):   Physical Activity:   . Days of Exercise per Week:   . Minutes of Exercise per Session:   Stress:   . Feeling of Stress :   Social Connections:   . Frequency of Communication with Friends and Family:   . Frequency of Social Gatherings with Friends and Family:   . Attends Religious Services:   . Active Member of Clubs or Organizations:   .  Attends Banker Meetings:   Marland Kitchen Marital Status:   Intimate Partner Violence:   . Fear of Current or Ex-Partner:   . Emotionally Abused:   Marland Kitchen Physically Abused:   . Sexually Abused:    family history includes COPD in her father; Hypertension in her brother; Lung cancer in her mother.  Current Facility-Administered Medications:  .  0.9 %  sodium chloride infusion, 250 mL, Intravenous, Continuous, Swayze, Ava, DO, Last Rate: 10 mL/hr at 08/14/19 1747, 250 mL at 08/14/19 1747 .  ALPRAZolam (XANAX) tablet 0.25 mg, 0.25 mg, Oral, Daily PRN, Swayze, Ava, DO, 0.25 mg at 08/14/19 2147 .  ampicillin-sulbactam (UNASYN) 1.5 g in sodium chloride 0.9 % 100 mL IVPB, 1.5 g, Intravenous, Q8H, Oretha Milch, MD, Stopped at 08/15/19 [phone] .  calcium gluconate 930 mg of elemental calcium in sodium chloride 0.9 %  1,000 mL infusion, 1 mg elemental calcium/kg/hr, Intravenous, Continuous, Ramaswamy, Murali, MD, Last Rate: 48.8 mL/hr at 08/14/19 2308, 1 mg elemental calcium/kg/hr at 08/14/19 2308 .  Chlorhexidine Gluconate Cloth 2 % PADS 6 each, 6 each, Topical, Daily, Kalman Shan, MD, 6 each at 08/14/19 1000 .  ipratropium-albuterol (DUONEB) 0.5-2.5 (3) MG/3ML nebulizer solution 3 mL, 3 mL, Nebulization, Q4H PRN, Jeannette Corpus T, MD, 3 mL at 08/15/19 0235 .  methylPREDNISolone sodium succinate (SOLU-MEDROL) 125 mg/2 mL injection 60 mg, 60 mg, Intravenous, Q12H, Oretha Milch, MD, 60 mg at 08/15/19 0215 .  metoCLOPramide (REGLAN) injection 5 mg, 5 mg, Intravenous, Q6H, Desai, Rahul P, PA-C, 5 mg at 08/15/19 7654 .  norepinephrine (LEVOPHED) 4mg  in premix infusion, 2-10 mcg/min, Intravenous, Titrated, Swayze, Ava, DO, Stopped at 08/14/19 1545 .  ondansetron (ZOFRAN) tablet 4 mg, 4 mg, Oral, Q6H PRN **OR** ondansetron (ZOFRAN) injection 4 mg, 4 mg, Intravenous, Q6H PRN, Swayze, Ava, DO Allergies  Allergen Reactions  . Diclofenac Other (See Comments)    Stomach burning  . Macrodantin [Nitrofurantoin Macrocrystal] Itching, Nausea And Vomiting, Swelling and Rash    Tongue swelling  . Tramadol Other (See Comments)    Stomach burning  . Codeine Nausea And Vomiting  . Topamax [Topiramate] Other (See Comments)    Loss of taste, fatigue     Objective:     BP (!) 91/54   Pulse 98   Temp (!) 97.5 F (36.4 C) (Oral)   Resp 16   Ht 5\' 4"  (1.626 m)   Wt 57.7 kg   SpO2 96%   BMI 21.83 kg/m   She appears ill  No acute distress  Heart regular rhythm  Lungs clear  Abdomen soft and nontender    Laboratory No components found for: D1    Assessment:     Nausea vomiting and diarrhea resulting in dehydration.  Electrolyte abnormality  History of collagenous colitis  Recurrent hiatal hernia      Plan:     I would be suspicious of an underlying enteric infection and asked if  stool studies have been obtained but found out today they have not been.  GI pathogens panel needs to be done as well as C. difficile.  Continue supportive care per ICU team.  Appreciate their treatment.  Will follow.

## 2019-08-16 DIAGNOSIS — E43 Unspecified severe protein-calorie malnutrition: Secondary | ICD-10-CM

## 2019-08-16 LAB — CBC
HCT: 31.6 % — ABNORMAL LOW (ref 36.0–46.0)
Hemoglobin: 10.7 g/dL — ABNORMAL LOW (ref 12.0–15.0)
MCH: 33.6 pg (ref 26.0–34.0)
MCHC: 33.9 g/dL (ref 30.0–36.0)
MCV: 99.4 fL (ref 80.0–100.0)
Platelets: 188 10*3/uL (ref 150–400)
RBC: 3.18 MIL/uL — ABNORMAL LOW (ref 3.87–5.11)
RDW: 12.4 % (ref 11.5–15.5)
WBC: 5.8 10*3/uL (ref 4.0–10.5)
nRBC: 0 % (ref 0.0–0.2)

## 2019-08-16 LAB — MAGNESIUM: Magnesium: 1.6 mg/dL — ABNORMAL LOW (ref 1.7–2.4)

## 2019-08-16 LAB — PHOSPHORUS: Phosphorus: 3.7 mg/dL (ref 2.5–4.6)

## 2019-08-16 LAB — COMPREHENSIVE METABOLIC PANEL
ALT: 13 U/L (ref 0–44)
AST: 15 U/L (ref 15–41)
Albumin: 2.4 g/dL — ABNORMAL LOW (ref 3.5–5.0)
Alkaline Phosphatase: 74 U/L (ref 38–126)
Anion gap: 11 (ref 5–15)
BUN: 12 mg/dL (ref 8–23)
CO2: 21 mmol/L — ABNORMAL LOW (ref 22–32)
Calcium: 12 mg/dL — ABNORMAL HIGH (ref 8.9–10.3)
Chloride: 103 mmol/L (ref 98–111)
Creatinine, Ser: 0.83 mg/dL (ref 0.44–1.00)
GFR calc Af Amer: >60 mL/min (ref >60)
GFR calc non Af Amer: >60 mL/min (ref >60)
Glucose, Bld: 124 mg/dL — ABNORMAL HIGH (ref 70–99)
Potassium: 4.5 mmol/L (ref 3.5–5.1)
Sodium: 135 mmol/L (ref 135–145)
Total Bilirubin: 0.9 mg/dL (ref 0.3–1.2)
Total Protein: 5.7 g/dL — ABNORMAL LOW (ref 6.5–8.1)

## 2019-08-16 MED ORDER — MAGNESIUM SULFATE 2 GM/50ML IV SOLN
2.0000 g | Freq: Once | INTRAVENOUS | Status: AC
  Administered 2019-08-16: 2 g via INTRAVENOUS
  Filled 2019-08-16: qty 50

## 2019-08-16 MED ORDER — SODIUM CHLORIDE 0.9 % IV SOLN
1.5000 g | Freq: Four times a day (QID) | INTRAVENOUS | Status: DC
  Administered 2019-08-16 – 2019-08-18 (×10): 1.5 g via INTRAVENOUS
  Filled 2019-08-16: qty 4
  Filled 2019-08-16 (×4): qty 1.5
  Filled 2019-08-16: qty 4
  Filled 2019-08-16 (×3): qty 1.5
  Filled 2019-08-16: qty 4
  Filled 2019-08-16: qty 1.5
  Filled 2019-08-16: qty 4

## 2019-08-16 MED ORDER — METHYLPREDNISOLONE SODIUM SUCC 125 MG IJ SOLR
60.0000 mg | Freq: Two times a day (BID) | INTRAMUSCULAR | Status: DC
  Administered 2019-08-16 – 2019-08-17 (×4): 60 mg via INTRAVENOUS
  Filled 2019-08-16 (×4): qty 2

## 2019-08-16 MED ORDER — SODIUM CHLORIDE 0.9% FLUSH: 10.0000 mL | INTRAVENOUS | Status: DC | PRN

## 2019-08-16 MED ORDER — SODIUM CHLORIDE 0.9% FLUSH
10.0000 mL | Freq: Two times a day (BID) | INTRAVENOUS | Status: DC
  Administered 2019-08-16 – 2019-08-23 (×10): 10 mL

## 2019-08-16 NOTE — Evaluation (Signed)
Physical Therapy Evaluation Patient Details Name: Brenda [name] MRN: [id number] DOB: [birth year] Today's Date: 08/16/2019   History of Present Illness  Pt is a 79 y.o. female  with past medical history of GERD, status post Nissen fundoplication, paraesophageal hiatal hernia repair, acute kidney injury, enteritis, presented to hospital on 08/14/2019 with complaints of intractable nausea, vomiting and diarrhea for 4 weeks.  During hospitalization pt has been hypotensive. Admitted with volume depletion, AKI, hypotension, intractable N/V/D, hypokalemia, and hypomangesemia  Clinical Impression   Pt admitted with above diagnosis. Evaluation was limited due to pt fatigued, lethargic, and reports "just feels bad."  She was able to complete bed level transfers and partial stands with cues for technique, sequencing, and min A.  Pt fatigued very easily.  Pt presented with generalized weakness, decreased endurance, strength, mobility, safety, and balance.  Additionally, pt with tremors throughout - reports this is baseline, but was unable to getting sitting BP due to tremors.  Pt was previously independent.  She has good support system from spouse but lives in a multi-level home and is requiring min-mod A at this time.  Recommend SNF at d/c.  Pt currently with functional limitations due to the deficits listed below (see PT Problem List). Pt will benefit from skilled PT to increase their independence and safety with mobility to allow discharge to the venue listed below.       Follow Up Recommendations SNF    Equipment Recommendations  Wheelchair cushion (measurements PT);Wheelchair (measurements PT);3in1 (PT) (to be further assessed next venue)    Recommendations for Other Services       Precautions / Restrictions Precautions Precautions: Fall Precaution Comments: hypotension      Mobility  Bed Mobility Overal bed mobility: Needs Assistance Bed Mobility: Supine to Sit;Sit to Supine;Rolling Rolling:  Min assist   Supine to sit: Min assist Sit to supine: Min assist   General bed mobility comments: increased time and cues for sequencing; pt's sheets were wet (appears to be from L arm weeping - notified RN) - pt rolled for sheet change  Transfers Overall transfer level: Needs assistance Equipment used: 1 person hand held assist Transfers: Sit to/from Stand;Lateral/Scoot Transfers Sit to Stand: Min assist        Lateral/Scoot Transfers: Min assist General transfer comment: Pt performed 3 partial stands with min A; declined full stand or ambulation due to not feeling well and wanting to sleep; she was able to lateral scoot up toward [facility]  Ambulation/Gait                Stairs            Wheelchair Mobility    Modified Rankin (Stroke Patients Only)       Balance Overall balance assessment: Needs assistance Sitting-balance support: No upper extremity supported Sitting balance-Leahy Scale: Fair Sitting balance - Comments: able to maintain static sitting with legs crossed   Standing balance support: Bilateral upper extremity supported Standing balance-Leahy Scale: Poor                               Pertinent Vitals/Pain Pain Assessment: No/denies pain    Home Living Family/patient expects to be discharged to:: Skilled nursing facility Living Arrangements: Spouse/significant other Available Help at Discharge: Family;Available 24 hours/day Type of Home: House Home Access: Stairs to enter Entrance Stairs-Rails: None Entrance Stairs-Number of Steps: 2 Home Layout: Two level;Bed/bath upstairs;1/2 bath on main level Home Equipment: Emergency planning/management officer -  2 wheels;Bedside commode      Prior Function Level of Independence: Needs assistance   Gait / Transfers Assistance Needed: Reports was independent with ambulation until last 4 weeks.  ADL's / Homemaking Assistance Needed: Pt was independent with ADLs , spouse has been providing min  guard/supervision past 4 weeks due to weakness        Hand Dominance        Extremity/Trunk Assessment   Upper Extremity Assessment Upper Extremity Assessment: Generalized weakness    Lower Extremity Assessment Lower Extremity Assessment: Generalized weakness (Pt not feeling well ; unable to MMT but appears generally weak)    Cervical / Trunk Assessment Cervical / Trunk Assessment: Normal  Communication   Communication: No difficulties  Cognition Arousal/Alertness: Awake/alert Behavior During Therapy: WFL for tasks assessed/performed Overall Cognitive Status: Impaired/Different from baseline Area of Impairment: Problem solving;Orientation;Following commands                 Orientation Level: Disoriented to;Time     Following Commands: Follows multi-step commands inconsistently;Follows one step commands with increased time     Problem Solving: Slow processing;Decreased initiation;Difficulty sequencing;Requires verbal cues;Requires tactile cues        General Comments General comments (skin integrity, edema, etc.): Pt's BP earlier was 133//83.  Unable to get reading in sitting due to tremors. Pt with tremors throughout body - reports this is baseline and varied depending on when she had her medicine.    Exercises     Assessment/Plan    PT Assessment Patient needs continued PT services  PT Problem List Decreased strength;Decreased mobility;Decreased safety awareness;Decreased range of motion;Decreased coordination;Decreased activity tolerance;Decreased balance;Cardiopulmonary status limiting activity;Decreased knowledge of use of DME       PT Treatment Interventions DME instruction;Therapeutic activities;Gait training;Therapeutic exercise;Patient/family education;Stair training;Balance training;Functional mobility training    PT Goals (Current goals can be found in the Care Plan section)  Acute Rehab PT Goals Patient Stated Goal: regain sterngth PT Goal  Formulation: With patient/family Time For Goal Achievement: 08/30/19 Potential to Achieve Goals: Good    Frequency Min 2X/week   Barriers to discharge Inaccessible home environment      Co-evaluation               AM-PAC PT "6 Clicks" Mobility  Outcome Measure Help needed turning from your back to your side while in a flat bed without using bedrails?: A Little Help needed moving from lying on your back to sitting on the side of a flat bed without using bedrails?: A Little Help needed moving to and from a bed to a chair (including a wheelchair)?: A Little Help needed standing up from a chair using your arms (e.g., wheelchair or bedside chair)?: A Little Help needed to walk in hospital room?: A Lot Help needed climbing 3-5 steps with a railing? : Total 6 Click Score: 15    End of Session Equipment Utilized During Treatment: Gait belt Activity Tolerance: Patient limited by lethargy Patient left: in bed;with call bell/phone within reach;with bed alarm set;with family/visitor present Nurse Communication: Mobility status; L arm weeping PT Visit Diagnosis: Unsteadiness on feet (R26.81);Other abnormalities of gait and mobility (R26.89);Muscle weakness (generalized) (M62.81)    Time: 7867-6720 PT Time Calculation (min) (ACUTE ONLY): 30 min   Charges:   PT Evaluation $PT Eval Moderate Complexity: 1 Mod PT Treatments $Therapeutic Activity: 8-22 mins        Brenda [name], PT Acute Rehab Services Pager [phone] [facility] [phone]    Rayetta Humphrey  08/16/2019, 3:50 PM

## 2019-08-16 NOTE — Progress Notes (Signed)
PROGRESS NOTE  Brenda [name] ZOX:[id number] DOB: 18-Apr-1940 DOA: 08/13/2019 PCP: Clovis Riley, L.August Saucer, MD   LOS: 3 days   Brief narrative: As per HPI,  Brenda [name] is a 79 y.o. female  with past medical history of GERD, status post Nissen fundoplication, paraesophageal hiatal hernia repair, acute kidney injury, enteritis, presented to hospital on 08/14/2019 with complaints of intractable nausea, vomiting and diarrhea for 4 weeks.   Patient was being treated by Dr. Bing Matter GI as outpatient.  During this admission, patient subsequently remained hypotensive despite 3 L of IV fluid and was started on Levophed and PCCM was consulted.   Patient remained in the ICU for vasopressors and was subsequently weaned off vasopressors.  She was then transferred out of the ICU.  CT abd / pelvis was negative for any acute issues.    Assessment/Plan:  Principal Problem:   Volume depletion Active Problems:   Paraesophageal hiatal hernia s/p repair w biologic mesh 03/26/2015   AKI (acute kidney injury) (HCC)   Hiatal hernia with obstruction but no gangrene   Nausea vomiting and diarrhea   Hypotension   Enteritis   Volume depletion, gastrointestinal loss   Protein-calorie malnutrition, severe   Hypotension -presumed to be hypovolemic secondary to intractable nausea, vomiting and diarrhea.  Received IV fluids/ vasopressors.  At this time, blood pressure has improved.    Shortness of breath, cough.  Patient is on Unasyn.  CXR showed showed  bibasilar opacity bilateral mild to moderate effusions and atelectasis. Currently on 3 L of nasal cannula.  Await speech and swallow evaluation.  The family states that she occasionally has swallow issues.  Patient received 40 mg of Lasix yesterday and will receive 40 mg today.   Intractable nausea/vomiting/diarrhea.  History of collagenous colitis.  Recurrent hiatal hernia - despite hx of nissen fundoplication in 2017.  As needed Zofran.  GI on board.  Tried clears  yesterday.  UGI series with KUB -limited study.  Patient was on budesonide and Viberzi as outpatient.  Currently on IV Solu-Medrol.  GI on board.  Advance diet as tolerated to full liquids today..  Enteritis. Unable to collect stool sample due to lack of bowel movement for GI panel and C. difficile study.    AKI.  Likely secondary to volume depletion.  Has improved at this time.  Check BMP in a.m. monitor on IV Lasix.  Hypokalemia and hypomagnesemia -secondary to vomiting/diarrhea.  S/p repletion. Received iv magnesium yesterday.  Magnesium 1.6 today.  Will replenish, will give 2 g of IV magnesium sulfate today. Potassium has improved.  Hyponatremia.  Improved.  Sodium of 135 today.  History of HTN, HLD. Continue to hold home atorvastatin, lisinopril.  Restarted verapamil yesterday.  Blood pressure has improved.  Hx anxiety. - Continue alprazolam.  Deconditioning, debility, ambulatory dysfunction.  Patient uses walker at home.   PT evaluation.   DVT prophylaxis: SCDs Start: 08/13/19 1906  Code Status: Full code  Family Communication: I again spoke with the patient's husband at bedside and updated him about the clinical condition of the patient.  Status is: Inpatient  Remains inpatient appropriate because:Unsafe d/c plan, IV treatments appropriate due to intensity of illness or inability to take PO and Inpatient level of care appropriate due to severity of illness   Dispo: The patient is from: Home              Anticipated d/c is to: Undetermined at this time.  Await PT evaluation.  Anticipated d/c date is: 2 days.                Patient currently is not medically stable to d/c.   Consultants:  PCCM  GI  Procedures:  None  Antibiotics:  . Unasyn IV  Anti-infectives (From admission, onward)   Start     Dose/Rate Route Frequency Ordered Stop   08/14/19 1730  ampicillin-sulbactam (UNASYN) 1.5 g in sodium chloride 0.9 % 100 mL IVPB     Discontinue      1.5 g 200 mL/hr over 30 Minutes Intravenous Every 8 hours 08/14/19 1636       Subjective:  Today, patient was seen and examined at bedside.  Still has not had a bowel movement.  Denies any nausea or overt vomiting.  On clear liquids.  Complains of generalized weakness.  Objective: Vitals:   08/16/19 0014 08/16/19 0339  BP: (!) 162/95 128/76  Pulse: (!) 129 (!) 118  Resp: 19 20  Temp:  98.4 F (36.9 C)  SpO2: 100% 95%    Intake/Output Summary (Last 24 hours) at 08/16/2019 0722 Last data filed at 08/16/2019 0339 Gross per 24 hour  Intake 1957.74 ml  Output 1700 ml  Net 257.74 ml   Filed Weights   08/13/19 1429 08/15/19 0620 08/16/19 0339  Weight: 45.4 kg 57.7 kg 55.2 kg   Body mass index is 20.89 kg/m.   Physical Exam: GENERAL: Patient is alert awake and communicative, not in obvious distress.  Thinly built.  On nasal cannula oxygen HENT: No scleral pallor or icterus. Pupils equally reactive to light. Oral mucosa is moist NECK: is supple, no gross swelling noted. CHEST:   Diminished breath sounds bilaterally.  No obvious crackles or wheezes noted CVS: S1 and S2 heard, no murmur. Regular rate and rhythm.  ABDOMEN: Soft, diffuse tenderness, mild and nonspecific bowel sounds are present. EXTREMITIES: No edema. CNS: Cranial nerves are intact.  Moving all extremities SKIN: warm and dry without rashes.  Data Review: I have personally reviewed the following laboratory data and studies,  CBC: Recent Labs  Lab 08/13/19 1447 08/14/19 0252 08/14/19 0500 08/15/19 0058 08/16/19 0430  WBC 7.7  --  5.3 7.0 5.8  NEUTROABS 5.6  --   --   --   --   HGB 11.9* 10.5* 10.3* 10.8* 10.7*  HCT 36.3 31.0* 30.6* 32.7* 31.6*  MCV 102.3*  --  101.7* 100.3* 99.4  PLT 206  --  228 198 188   Basic Metabolic Panel: Recent Labs  Lab 08/13/19 0017 08/13/19 0017 08/13/19 1447 08/13/19 1447 08/13/19 1558 08/14/19 0252 08/14/19 0252 08/14/19 0500 08/15/19 0058 08/15/19 0735  08/15/19 1157 08/16/19 0430  NA 130*   < > 134*  --   --  133*  --  129* 132*  --   --  135  K 3.5   < > 2.8*   < >  --  4.0   < > 4.0 6.1* 4.9 5.4* 4.5  CL 104  --  103  --   --   --   --  103 106  --   --  103  CO2 13*  --  14*  --   --   --   --  16* 18*  --   --  21*  GLUCOSE 159*  --  90  --   --   --   --  273* 198*  --   --  124*  BUN 33*  --  46*  --   --   --   --  28* 17  --   --  12  CREATININE 1.37*  --  2.04*  --   --   --   --  1.16* 0.95  --   --  0.83  CALCIUM 6.5*  --  6.5*  --   --   --   --  6.9* 10.1  --   --  12.0*  MG  --   --   --   --  1.6*  --   --  2.0 1.5*  --   --  1.6*  PHOS  --   --   --   --   --   --   --  1.6* 4.0  --   --  3.7   < > = values in this interval not displayed.   Liver Function Tests: Recent Labs  Lab 08/13/19 1447 08/16/19 0430  AST 29 15  ALT 17 13  ALKPHOS 83 74  BILITOT 1.2 0.9  PROT 5.8* 5.7*  ALBUMIN 2.5* 2.4*   Recent Labs  Lab 08/13/19 1447  LIPASE 32   No results for input(s): AMMONIA in the last 168 hours. Cardiac Enzymes: No results for input(s): CKTOTAL, CKMB, CKMBINDEX, TROPONINI in the last 168 hours. BNP (last 3 results) No results for input(s): BNP in the last 8760 hours.  ProBNP (last 3 results) No results for input(s): PROBNP in the last 8760 hours.  CBG: Recent Labs  Lab 08/13/19 1509 08/13/19 1653 08/15/19 0734  GLUCAP 74 106* 244*   Recent Results (from the past 240 hour(s))  Blood culture (routine x 2)     Status: None (Preliminary result)   Collection Time: 08/13/19  2:54 PM   Specimen: BLOOD RIGHT WRIST  Result Value Ref Range Status   Specimen Description BLOOD RIGHT WRIST  Final   Special Requests   Final    BOTTLES DRAWN AEROBIC AND ANAEROBIC Blood Culture adequate volume   Culture   Final    NO GROWTH < 24 HOURS Performed at [facility], [address], [place], [place] [address]    Report Status PENDING  Incomplete  Blood culture (routine x 2)     Status: None (Preliminary  result)   Collection Time: 08/13/19  3:20 PM   Specimen: BLOOD RIGHT HAND  Result Value Ref Range Status   Specimen Description BLOOD RIGHT HAND  Final   Special Requests   Final    BOTTLES DRAWN AEROBIC AND ANAEROBIC Blood Culture results may not be optimal due to an inadequate volume of blood received in culture bottles   Culture   Final    NO GROWTH < 24 HOURS Performed at [facility], [address], [place], [place] [address]    Report Status PENDING  Incomplete  SARS Coronavirus 2 by RT PCR (hospital order, performed in [facility] hospital lab) Nasopharyngeal Nasopharyngeal Swab     Status: None   Collection Time: 08/13/19  7:44 PM   Specimen: Nasopharyngeal Swab  Result Value Ref Range Status   SARS Coronavirus 2 NEGATIVE NEGATIVE Final    Comment: (NOTE) SARS-CoV-2 target nucleic acids are NOT DETECTED.  The SARS-CoV-2 RNA is generally detectable in upper and lower respiratory specimens during the acute phase of infection. The lowest concentration of SARS-CoV-2 viral copies this assay can detect is 250 copies / mL. A negative result does not preclude SARS-CoV-2 infection and should not be  used as the sole basis for treatment or other patient management decisions.  A negative result may occur with improper specimen collection / handling, submission of specimen other than nasopharyngeal swab, presence of viral mutation(s) within the areas targeted by this assay, and inadequate number of viral copies (<250 copies / mL). A negative result must be combined with clinical observations, patient history, and epidemiological information.  Fact Sheet for Patients:   BoilerBrush.com.cy  Fact Sheet for Healthcare Providers: https://pope.com/  This test is not yet approved or  cleared by the [place] FDA and has been authorized for detection and/or diagnosis of SARS-CoV-2 by FDA under an Emergency Use Authorization (EUA).   This EUA will remain in effect (meaning this test can be used) for the duration of the COVID-19 declaration under Section 564(b)(1) of the Act, 21 U.S.C. section 360bbb-3(b)(1), unless the authorization is terminated or revoked sooner.  Performed at [facility], [address], [place], [place] [address]   Urine culture     Status: None   Collection Time: 08/14/19 10:27 AM   Specimen: Urine, Random  Result Value Ref Range Status   Specimen Description URINE, RANDOM  Final   Special Requests NONE  Final   Culture   Final    NO GROWTH Performed at [facility], [address], [place], [place] [address]    Report Status 08/15/2019 FINAL  Final  MRSA PCR Screening     Status: None   Collection Time: 08/14/19 10:27 AM   Specimen: Nasopharyngeal  Result Value Ref Range Status   MRSA by PCR NEGATIVE NEGATIVE Final    Comment:        The GeneXpert MRSA Assay (FDA approved for NASAL specimens only), is one component of a comprehensive MRSA colonization surveillance program. It is not intended to diagnose MRSA infection nor to guide or monitor treatment for MRSA infections. Performed at [facility], [address] [place], [place] [address]      Studies: DG CHEST PORT 1 VIEW  Result Date: 08/15/2019 CLINICAL DATA:  79 year old female with shortness of breath. EXAM: PORTABLE CHEST 1 VIEW COMPARISON:  Chest radiograph dated 08/14/2019. FINDINGS: Small to moderate bilateral pleural effusions with associated bibasilar atelectasis. Pneumonia is not excluded clinical correlation is recommended. There is mild cardiomegaly with mild vascular congestion. No pneumothorax. Atherosclerotic calcification of the aorta. No acute osseous pathology. IMPRESSION: 1. Cardiomegaly with mild vascular congestion and small to moderate bilateral pleural effusions. 2. Bibasilar atelectasis. Pneumonia is not excluded. Overall no significant interval change since the prior radiograph.  Electronically Signed   By: Elgie Collard M.D.   On: 08/15/2019 15:59   DG Chest Port 1 View  Result Date: 08/14/2019 CLINICAL DATA:  Hypoxia EXAM: PORTABLE CHEST 1 VIEW COMPARISON:  08/13/2019 FINDINGS: Cardiac shadow is stable. Aortic calcifications are noted. Increasing left basilar infiltrate and effusion is seen. Some increase in right basilar opacity is noted as well. Mild vascular congestion is seen. No bony abnormality is noted. IMPRESSION: Increasing bibasilar airspace opacity with left-sided effusion. Electronically Signed   By: Alcide Clever M.D.   On: 08/14/2019 22:46   DG UGI W SINGLE CM (SOL OR THIN BA)  Result Date: 08/14/2019 CLINICAL DATA:  Vomiting and regurgitation. EXAM: UPPER GI SERIES WITH KUB TECHNIQUE: After obtaining a scout radiograph a routine upper GI series was performed using thin barium FLUOROSCOPY TIME:  Fluoroscopy Time:  2 minutes and 48 seconds. Radiation Exposure Index (if provided by the  fluoroscopic device): 44.2 mGy Number of Acquired Spot Images: COMPARISON:  Abdomen pelvis CT 08/13/2019.  Esophagram 06/18/2015. FINDINGS: Pre-procedure KUB shows gaseous distention of stomach and colon. Upper lumbar vertebral augmentation evident. Limited study due to patient immobility. Exam was performed with the patient RAO and LPO on the fluoro table positioned at 30 degree head up ankle. Patient was given sips of contrast material from a cup. This demonstrates disruption of primary peristalsis with tertiary contractions in the distal third of the esophagus. Distal esophageal wall appears irregular with a potential tiny distal esophageal diverticulum. As noted on the CT scan, there is a moderate hiatal hernia with approximately 25-50% of the stomach above the diaphragmatic hiatus. Patient is reportedly status post fundoplication which may account for deformity at the gastroesophageal junction. This deformity may also be aggravated by the hiatal hernia. Otherwise no gross gastric  mass lesion evident. Gastric emptying is prompt. Duodenal C-loop and ligament of Treitz are normally positioned. IMPRESSION: 1. Markedly limited study due to patient immobility. 2. Disruption of primary peristalsis with tertiary contractions in the distal third of the esophagus. There is some smooth wall irregularity in the distal esophagus, indeterminate. Potential tiny distal esophageal diverticulum. 3. Moderate hiatal hernia with 25-50% of the stomach contained in the chest. Deformity of the esophagogastric junction may be related to fundoplication and aggravated by the hiatal hernia. 4. Prompt gastric emptying with normal position of the duodenum and ligament of Treitz. 5. Due to the markedly limited nature of this study, follow-up EGD or dedicated upper GI series after patient recovers from acute symptoms recommended. Electronically Signed   By: Kennith Center M.D.   On: 08/14/2019 09:40      Joycelyn Das, MD  [facility] 08/16/2019

## 2019-08-16 NOTE — Evaluation (Signed)
Clinical/Bedside Swallow Evaluation Patient Details  Name: Brenda [name] MRN: [id number] Date of Birth: Nov 09, 1940  Today's Date: 08/16/2019 Time: SLP Start Time (ACUTE ONLY): 1516 SLP Stop Time (ACUTE ONLY): 1540 SLP Time Calculation (min) (ACUTE ONLY): 24 min  Past Medical History:  Past Medical History:  Diagnosis Date  . Anxiety   . Chronic diarrhea   . Coarse tremors   . Colon polyp   . DDD (degenerative disc disease), cervical   . Hiatal hernia   . History of skin cancer   . Hypercholesteremia   . Hypertension   . Migraines   . Osteoporosis   . Shortness of breath dyspnea    with exertion  . Tremor   . Weight loss    Past Surgical History:  Past Surgical History:  Procedure Laterality Date  . ESOPHAGEAL MANOMETRY N/A 01/15/2015   Procedure: ESOPHAGEAL MANOMETRY (EM);  Surgeon: Graylin Shiver, MD;  Location: [facility]COPY;  Service: Endoscopy;  Laterality: N/A;  . HIATAL HERNIA REPAIR    . KYPHOPLASTY N/A 12/22/2017   Procedure: LUMBAR TWO KYPHOPLASTY;  Surgeon: Estill Bamberg, MD;  Location: [facility] OR;  Service: Orthopedics;  Laterality: N/A;  LUMBAR TWO KYPHOPLASTY  . RHINOPLASTY    . SINUS SURGERY WITH INSTATRAK     HPI:  Pt is a 79 yr old female with past medical history significant for hiatal hernia (s/p [employer] Fundoplication 03/2015), chronic diarrhea, IBS, paraesophageal hiatal hernia repair, GERD, essential tremor, colon polyps) who was admitted secondary to intractable nausea, vomiting, and diarrhea for the past 4 weeks which became particularly severe in the past week. CXR 7/28: Cardiomegaly with mild vascular congestion and small to moderate bilateral pleural effusions. Bibasilar atelectasis. Pneumonia is not excluded. Unasyn started due to concern for aspiration pneumonia. CT abdomen: Moderate size hiatal hernia containing a portion of the stomach.   Assessment / Plan / Recommendation Clinical Impression  Pt was seen for bedside swallow evaluation with her husband  present who assissted with provision of history. Pt reported globus sensation and getting "strangled" with some solids including steak. She stated that she has been symptomatic for approximately 4 weeks and had similar symptoms 3-4 years prior which were significantly improved and almost resolved following hernia repair. Oral mechanism exam was [facility] and she presented with adequate dentition. The evaluation was limited to clear liquids due to her current diet restrictions and no symptoms of oropharyngeal dysphagia were noted with trials. SLP will follow for assessment of solids when her diet is advanced and for further intervention if warranted.  SLP Visit Diagnosis: Dysphagia, unspecified (R13.10)    Aspiration Risk  No limitations    Diet Recommendation Thin liquid (Continue clear liquids with advancement per MD rx)   Liquid Administration via: Cup;Straw;Spoon Medication Administration: Whole meds with liquid Supervision: Patient able to self feed Postural Changes: Seated upright at 90 degrees    Other  Recommendations Oral Care Recommendations: Oral care BID   Follow up Recommendations Other (comment) (TBD)      Frequency and Duration min 2x/week  2 weeks       Prognosis        Swallow Study   General Date of Onset: 08/16/19 HPI: Pt is a 79 yr old female with past medical history significant for hiatal hernia (s/p Nissan Fundoplication 03/2015), chronic diarrhea, IBS, paraesophageal hiatal hernia repair, GERD, essential tremor, colon polyps) who was admitted secondary to intractable nausea, vomiting, and diarrhea for the past 4 weeks which became particularly severe in the  past week. CXR 7/28: Cardiomegaly with mild vascular congestion and small to moderate bilateral pleural effusions. Bibasilar atelectasis. Pneumonia is not excluded. Unasyn started due to concern for aspiration pneumonia. CT abdomen: Moderate size hiatal hernia containing a portion of the stomach. Type of Study:  Bedside Swallow Evaluation Previous Swallow Assessment: None Diet Prior to this Study: Thin liquids (clear liquids) Temperature Spikes Noted: No Respiratory Status: Nasal cannula History of Recent Intubation: No Behavior/Cognition: Alert;Cooperative;Pleasant mood Oral Cavity Assessment: Within Functional Limits Oral Care Completed by SLP: No Vision: Functional for self-feeding Self-Feeding Abilities: Able to feed self Patient Positioning: Upright in bed;Postural control adequate for testing Baseline Vocal Quality: Normal Volitional Cough: Strong Volitional Swallow: Able to elicit    Oral/Motor/Sensory Function Overall Oral Motor/Sensory Function: Within functional limits   Ice Chips Ice chips: Within functional limits Presentation: Spoon   Thin Liquid Thin Liquid: Within functional limits Presentation: Straw;Cup    Nectar Thick Nectar Thick Liquid: Not tested   Honey Thick Honey Thick Liquid: Not tested   Puree Puree: Not tested   Solid     Solid: Not tested     Yvone Neu I. Vear Clock, MS, CCC-SLP Acute Rehabilitation Services Office number [phone] Pager [phone]  Scheryl Marten 08/16/2019,5:04 PM

## 2019-08-16 NOTE — Progress Notes (Signed)
[facility] Progress Note  Subjective: Continues to feel weak and rundown.  No further diarrhea or vomiting reported.  Objective: Vital signs in last 24 hours: Temp:  [97.7 F (36.5 C)-98.5 F (36.9 C)] 97.9 F (36.6 C) (07/29 1118) Pulse Rate:  [100-130] 110 (07/29 1118) Resp:  [15-26] 15 (07/29 1118) BP: (124-178)/(69-95) 133/83 (07/29 1118) SpO2:  [95 %-100 %] 97 % (07/29 1118) Weight:  [55.2 kg] 55.2 kg (07/29 0339) Weight change: -2.5 kg   PE:  Alert  Heart regular rhythm  Abdomen soft nontender  Lab Results: Results for orders placed or performed during the hospital encounter of 08/13/19 (from the past 24 hour(s))  Comprehensive metabolic panel     Status: Abnormal   Collection Time: 08/16/19  4:30 AM  Result Value Ref Range   Sodium 135 135 - 145 mmol/L   Potassium 4.5 3.5 - 5.1 mmol/L   Chloride 103 98 - 111 mmol/L   CO2 21 (L) 22 - 32 mmol/L   Glucose, Bld 124 (H) 70 - 99 mg/dL   BUN 12 8 - 23 mg/dL   Creatinine, Ser 5.64 0.44 - 1.00 mg/dL   Calcium 33.2 (H) 8.9 - 10.3 mg/dL   Total Protein 5.7 (L) 6.5 - 8.1 g/dL   Albumin 2.4 (L) 3.5 - 5.0 g/dL   AST 15 15 - 41 U/L   ALT 13 0 - 44 U/L   Alkaline Phosphatase 74 38 - 126 U/L   Total Bilirubin 0.9 0.3 - 1.2 mg/dL   GFR calc non Af Amer >60 >60 mL/min   GFR calc Af Amer >60 >60 mL/min   Anion gap 11 5 - 15  Magnesium     Status: Abnormal   Collection Time: 08/16/19  4:30 AM  Result Value Ref Range   Magnesium 1.6 (L) 1.7 - 2.4 mg/dL  Phosphorus     Status: None   Collection Time: 08/16/19  4:30 AM  Result Value Ref Range   Phosphorus 3.7 2.5 - 4.6 mg/dL  CBC     Status: Abnormal   Collection Time: 08/16/19  4:30 AM  Result Value Ref Range   WBC 5.8 4.0 - 10.5 K/uL   RBC 3.18 (L) 3.87 - 5.11 MIL/uL   Hemoglobin 10.7 (L) 12.0 - 15.0 g/dL   HCT 95.1 (L) 36 - 46 %   MCV 99.4 80.0 - 100.0 fL   MCH 33.6 26.0 - 34.0 pg   MCHC 33.9 30.0 - 36.0 g/dL   RDW 88.4 16.6 - 06.3 %   Platelets 188  150 - 400 K/uL   nRBC 0.0 0.0 - 0.2 %    Studies/Results: DG CHEST PORT 1 VIEW  Result Date: 08/15/2019 CLINICAL DATA:  79 year old female with shortness of breath. EXAM: PORTABLE CHEST 1 VIEW COMPARISON:  Chest radiograph dated 08/14/2019. FINDINGS: Small to moderate bilateral pleural effusions with associated bibasilar atelectasis. Pneumonia is not excluded clinical correlation is recommended. There is mild cardiomegaly with mild vascular congestion. No pneumothorax. Atherosclerotic calcification of the aorta. No acute osseous pathology. IMPRESSION: 1. Cardiomegaly with mild vascular congestion and small to moderate bilateral pleural effusions. 2. Bibasilar atelectasis. Pneumonia is not excluded. Overall no significant interval change since the prior radiograph. Electronically Signed   By: Elgie Collard M.D.   On: 08/15/2019 15:59      Assessment: Volume depletion in a patient who had diarrhea and vomiting.  This seems to have resolved.  Plan:   Continue supportive care    SAM Leafy Ro  08/16/2019, 1:38 PM  Pager: [phone] If no answer or after 5 PM call [phone]

## 2019-08-16 NOTE — Progress Notes (Signed)
VAST consulted to obtain IV access. Pt has had 4 IV's in 3 days, all of which have infiltrated. Pt has swelling in bilateral arms, left greater than right. Pt complaining of tenderness in lower right arm where previously stuck. Placed 24g IV in left hand, but pt needs more reliable access. This RN hesitant to place midline d/t swelling and inability to recognize possible infiltration early. Second assess requested by IV team.

## 2019-08-16 NOTE — Plan of Care (Signed)
  Problem: Clinical Measurements: Goal: Respiratory complications will improve Outcome: Progressing   Problem: Coping: Goal: Level of anxiety will decrease Outcome: Progressing   Problem: Safety: Goal: Ability to remain free from injury will improve Outcome: Progressing   

## 2019-08-16 NOTE — Progress Notes (Signed)
SLP Cancellation Note  Patient Details Name: Brenda [name] MRN: [id number] DOB: 1940/08/08   Cancelled treatment:       Reason Eval/Treat Not Completed: Patient at procedure or test/unavailable (Pt having line inserted. SLP will follow up. )  Alvar Malinoski I. Vear Clock, MS, CCC-SLP Acute Rehabilitation Services Office number [phone] Pager [phone]  Scheryl Marten 08/16/2019, 12:29 PM

## 2019-08-16 NOTE — Progress Notes (Signed)
Pharmacy Antibiotic Note  Brenda [name] is a 79 y.o. female admitted on 08/13/2019 with hypotension and nausea/vomiting.   There is a concern for aspiration PNA and pharmacy has been consulted for Uasyn dosing.  Will adjust the Unasyn dose slightly today for wt/renal function. Today is D#3 of coverage for aspiration - need to evaluate if clinically still needed as could contribute to worsening diarrhea.   Plan: - Adjust Unasyn to 1.5g IV every 6 hours - Will continue to follow renal function, culture results, LOT, and antibiotic de-escalation plans   Height: 5\' 4"  (162.6 cm) Weight: 55.2 kg (121 lb 11.1 oz) IBW/kg (Calculated) : 54.7  Temp (24hrs), Avg:98 F (36.7 C), Min:97.7 F (36.5 C), Max:98.5 F (36.9 C)  Recent Labs  Lab 08/13/19 0017 08/13/19 1447 08/13/19 1503 08/14/19 0500 08/15/19 0058 08/16/19 0430  WBC  --  7.7  --  5.3 7.0 5.8  CREATININE 1.37* 2.04*  --  1.16* 0.95 0.83  LATICACIDVEN  --   --  1.4  --   --   --     Estimated Creatinine Clearance: 49 mL/min (by C-G formula based on SCr of 0.83 mg/dL).    Allergies  Allergen Reactions  . Diclofenac Other (See Comments)    Stomach burning  . Macrodantin [Nitrofurantoin Macrocrystal] Itching, Nausea And Vomiting, Swelling and Rash    Tongue swelling  . Tramadol Other (See Comments)    Stomach burning  . Codeine Nausea And Vomiting  . Topamax [Topiramate] Other (See Comments)    Loss of taste, fatigue    Antimicrobials this admission: Unasyn 7/27 >>  Microbiology results: 7/26 COVID >> neg 7/26 BCX >> ngtd 7/27 MRSA PCR >> neg 7/27 UCx >> NG 7/27 GI panel >> 7/27 CDiff >>  Thank you for allowing pharmacy to be a part of this patient's care.  8/27, PharmD, BCPS Clinical Pharmacist Clinical phone for 08/16/2019: 08/18/2019 08/16/2019 8:59 AM   **Pharmacist phone directory can now be found on [email or web address] ([email or web address]).  Listed under [facility].

## 2019-08-17 LAB — COMPREHENSIVE METABOLIC PANEL
ALT: 12 U/L (ref 0–44)
AST: 15 U/L (ref 15–41)
Albumin: 2.3 g/dL — ABNORMAL LOW (ref 3.5–5.0)
Alkaline Phosphatase: 75 U/L (ref 38–126)
Anion gap: 9 (ref 5–15)
BUN: 14 mg/dL (ref 8–23)
CO2: 25 mmol/L (ref 22–32)
Calcium: 10.3 mg/dL (ref 8.9–10.3)
Chloride: 102 mmol/L (ref 98–111)
Creatinine, Ser: 0.81 mg/dL (ref 0.44–1.00)
GFR calc Af Amer: >60 mL/min (ref >60)
GFR calc non Af Amer: >60 mL/min (ref >60)
Glucose, Bld: 152 mg/dL — ABNORMAL HIGH (ref 70–99)
Potassium: 4.4 mmol/L (ref 3.5–5.1)
Sodium: 136 mmol/L (ref 135–145)
Total Bilirubin: 0.4 mg/dL (ref 0.3–1.2)
Total Protein: 6 g/dL — ABNORMAL LOW (ref 6.5–8.1)

## 2019-08-17 LAB — CBC
HCT: 31.1 % — ABNORMAL LOW (ref 36.0–46.0)
Hemoglobin: 10.2 g/dL — ABNORMAL LOW (ref 12.0–15.0)
MCH: 33.1 pg (ref 26.0–34.0)
MCHC: 32.8 g/dL (ref 30.0–36.0)
MCV: 101 fL — ABNORMAL HIGH (ref 80.0–100.0)
Platelets: 189 10*3/uL (ref 150–400)
RBC: 3.08 MIL/uL — ABNORMAL LOW (ref 3.87–5.11)
RDW: 12.6 % (ref 11.5–15.5)
WBC: 7.7 10*3/uL (ref 4.0–10.5)
nRBC: 0 % (ref 0.0–0.2)

## 2019-08-17 LAB — PHOSPHORUS: Phosphorus: 3.6 mg/dL (ref 2.5–4.6)

## 2019-08-17 LAB — MAGNESIUM: Magnesium: 1.6 mg/dL — ABNORMAL LOW (ref 1.7–2.4)

## 2019-08-17 MED ORDER — BUDESONIDE 3 MG PO CPEP
9.0000 mg | ORAL_CAPSULE | Freq: Every day | ORAL | Status: DC
  Administered 2019-08-18 – 2019-08-23 (×6): 9 mg via ORAL
  Filled 2019-08-17 (×7): qty 3

## 2019-08-17 MED ORDER — MAGNESIUM OXIDE 400 (241.3 MG) MG PO TABS
400.0000 mg | ORAL_TABLET | Freq: Two times a day (BID) | ORAL | Status: DC
  Administered 2019-08-17 – 2019-08-23 (×13): 400 mg via ORAL
  Filled 2019-08-17 (×14): qty 1

## 2019-08-17 MED ORDER — FUROSEMIDE 10 MG/ML IJ SOLN
40.0000 mg | Freq: Every day | INTRAMUSCULAR | Status: DC
  Administered 2019-08-17 – 2019-08-19 (×3): 40 mg via INTRAVENOUS
  Filled 2019-08-17 (×3): qty 4

## 2019-08-17 NOTE — Evaluation (Signed)
Occupational Therapy Evaluation Patient Details Name: Brenda [name] MRN: [id number] DOB: 1940/11/09 Today's Date: 08/17/2019    History of Present Illness Pt is a 79 y.o. female  with past medical history of GERD, status post Nissen fundoplication, paraesophageal hiatal hernia repair, acute kidney injury, enteritis, presented to hospital on 08/14/2019 with complaints of intractable nausea, vomiting and diarrhea for 4 weeks.  During hospitalization pt has been hypotensive. Admitted with volume depletion, AKI, hypotension, intractable N/V/D, hypokalemia, and hypomangesemia   Clinical Impression   Pt PTA: Pt was independent with mobility >1 mos ago, requiring assist for ADL due to BUE tremors. Pt recently requiring increased assist from spouse for ADL and mobility.  Pt very limited by weakness, decreased mobility, decreased ability to care for self and severe BUE tremors.  "I want to try standing." Pt minA overall for pivot use of RW to recliner. Pt minA for mobility and nearly maxA for self care tasks. VSS. BP 125/79 at EOB; no dizziness reported. Pt would benefit from continued OT skilled services. OT following acutely.        Follow Up Recommendations  SNF    Equipment Recommendations  Other (comment) (defer)    Recommendations for Other Services       Precautions / Restrictions Precautions Precautions: Fall Precaution Comments: hypotension Restrictions Weight Bearing Restrictions: No      Mobility Bed Mobility Overal bed mobility: Needs Assistance Bed Mobility: Supine to Sit     Supine to sit: Min assist     General bed mobility comments: MinA for trunk elevation and scooting to EOB  Transfers Overall transfer level: Needs assistance Equipment used: Rolling walker (2 wheeled) Transfers: Sit to/from [employer] Sit to Stand: Min assist Stand pivot transfers: Min assist       General transfer comment: "I want to try standing." Pt minA overall for  pivot use of RW to recliner    Balance Overall balance assessment: Needs assistance Sitting-balance support: No upper extremity supported Sitting balance-Leahy Scale: Fair Sitting balance - Comments: able to maintain static sitting with legs crossed   Standing balance support: Bilateral upper extremity supported Standing balance-Leahy Scale: Poor Standing balance comment: <10 secs for stand pivot with RW                           ADL either performed or assessed with clinical judgement   ADL Overall ADL's : Needs assistance/impaired Eating/Feeding: Minimal assistance;Sitting Eating/Feeding Details (indicate cue type and reason): pt with severe tremors and able to suck from straw, but difficulty bringing food to mouth Grooming: Minimal assistance;Sitting   Upper Body Bathing: Minimal assistance;Sitting   Lower Body Bathing: Maximal assistance;Sitting/lateral leans;Sit to/from stand   Upper Body Dressing : Minimal assistance;Sitting   Lower Body Dressing: Maximal assistance;Sitting/lateral leans;Sit to/from stand   Toilet Transfer: Min guard;Stand-pivot;RW   Toileting- Architect and Hygiene: Maximal assistance;Sitting/lateral lean;Sit to/from stand;Cueing for safety       Functional mobility during ADLs: Moderate assistance;Cueing for safety;Rolling walker General ADL Comments: Pt very limited by weakness, decreased mobility, decreased ability to care for self and severe BUE tremors. VSS. BP 125/79 at EOB; no dizziness reported.     Vision Baseline Vision/History: Wears glasses Wears Glasses: Reading only Patient Visual Report: No change from baseline Vision Assessment?: No apparent visual deficits     Perception     Praxis      Pertinent Vitals/Pain Pain Assessment: No/denies pain  Hand Dominance Right   Extremity/Trunk Assessment Upper Extremity Assessment Upper Extremity Assessment: Generalized weakness;RUE deficits/detail;LUE  deficits/detail RUE Deficits / Details: nearly constant tremors present RUE Coordination: decreased fine motor LUE Deficits / Details: nearly constant tremors present LUE Coordination: decreased fine motor   Lower Extremity Assessment Lower Extremity Assessment: Generalized weakness   Cervical / Trunk Assessment Cervical / Trunk Assessment: Normal   Communication Communication Communication: No difficulties   Cognition Arousal/Alertness: Awake/alert Behavior During Therapy: WFL for tasks assessed/performed Overall Cognitive Status: Impaired/Different from baseline Area of Impairment: Orientation;Problem solving                 Orientation Level: Disoriented to;Time           Problem Solving: Slow processing;Decreased initiation;Difficulty sequencing;Requires verbal cues;Requires tactile cues     General Comments  BP at EOB 125/79; no dizziness reported    Exercises     Shoulder Instructions      Home Living Family/patient expects to be discharged to:: Skilled nursing facility Living Arrangements: Spouse/significant other Available Help at Discharge: Family;Available 24 hours/day Type of Home: House Home Access: Stairs to enter [employer] of Steps: 2 Entrance Stairs-Rails: None Home Layout: Two level;Bed/bath upstairs;1/2 bath on main level   Alternate Level Stairs-Rails: Left Bathroom Shower/Tub: Producer, television/film/video: Standard     Home Equipment: Emergency planning/management officer - 2 wheels;Bedside commode          Prior Functioning/Environment Level of Independence: Needs assistance  Gait / Transfers Assistance Needed: Reports was independent with ambulation until last 4 weeks. ADL's / Homemaking Assistance Needed: Pt was independent with ADLs , spouse has been providing min guard/supervision past 4 weeks due to weakness Communication / Swallowing Assistance Needed: low tone voice          OT Problem List: Decreased  strength;Decreased activity tolerance;Impaired balance (sitting and/or standing);Decreased safety awareness;Pain;Impaired UE functional use;Decreased knowledge of precautions      OT Treatment/Interventions: Self-care/ADL training;Therapeutic exercise;Energy conservation;DME and/or AE instruction;Therapeutic activities;Patient/family education;Balance training    OT Goals(Current goals can be found in the care plan section) Acute Rehab OT Goals Patient Stated Goal: "I want to walk." OT Goal Formulation: With patient Time For Goal Achievement: 08/31/19 Potential to Achieve Goals: Good ADL Goals Pt Will Perform Grooming: (P) with min guard assist;sitting Pt Will Transfer to Toilet: (P) with min guard assist;ambulating;bedside commode Pt/caregiver will Perform Home Exercise Program: (P) Increased strength;Both right and left upper extremity Additional ADL Goal #1: (P) Pt will tolerating standing x5 mins for OOB ADL in order to increase endurance.  OT Frequency: Min 2X/week   Barriers to D/C:            Co-evaluation              AM-PAC OT "6 Clicks" Daily Activity     Outcome Measure Help from another person eating meals?: A Lot Help from another person taking care of personal grooming?: A Lot Help from another person toileting, which includes using toliet, bedpan, or urinal?: A Lot Help from another person bathing (including washing, rinsing, drying)?: A Lot Help from another person to put on and taking off regular upper body clothing?: A Little Help from another person to put on and taking off regular lower body clothing?: A Lot 6 Click Score: 13   End of Session Equipment Utilized During Treatment: Gait belt;Rolling walker Nurse Communication: Mobility status  Activity Tolerance: Patient tolerated treatment well Patient left: in chair;with call bell/phone within reach;with  chair alarm set  OT Visit Diagnosis: Unsteadiness on feet (R26.81);Muscle weakness (generalized)  (M62.81);Other symptoms and signs involving cognitive function                Time: 9371-6967 OT Time Calculation (min): 29 min Charges:  OT General Charges $OT Visit: 1 Visit OT Evaluation $OT Eval Moderate Complexity: 1 Mod OT Treatments $Self Care/Home Management : 8-22 mins  Flora Lipps, OTR/L Acute Rehabilitation Services Pager: [phone] Office: [phone]   Ashlay Altieri C 08/17/2019, 4:48 PM

## 2019-08-17 NOTE — NC FL2 (Addendum)
MEDICAID FL2 LEVEL OF CARE SCREENING TOOL     IDENTIFICATION  Patient Name: Brenda [name] Birthdate: 1940-05-30 Sex: female Admission Date (Current Location): 08/13/2019  [facility] and [place] Number:  Producer, television/film/video and Address:  [facility] [place] [facility], [address] [place], [place] [address]      Provider Number: [id number]  Attending Physician Name and Address:  Joycelyn Das, MD  Relative Name and Phone Number:  Erika Slaby    Current Level of Care: Hospital Recommended Level of Care: Skilled Nursing Facility Prior Approval Number:    Date Approved/Denied:   PASRR Number: [id number]  Discharge Plan: SNF    Current Diagnoses: Patient Active Problem List   Diagnosis Date Noted  . Protein-calorie malnutrition, severe 08/15/2019  . Dehydration   . Diarrhea   . Shock circulatory (HCC)   . AKI (acute kidney injury) (HCC) 08/13/2019  . Hiatal hernia with obstruction but no gangrene 08/13/2019  . Nausea vomiting and diarrhea 08/13/2019  . Volume depletion 08/13/2019  . Hypotension 08/13/2019  . Enteritis 08/13/2019  . Volume depletion, gastrointestinal loss 08/13/2019  . Paraesophageal hiatal hernia s/p repair w biologic mesh 03/26/2015 03/27/2015  . GERD (gastroesophageal reflux disease) s/p Nissen fundoplication 03/26/2015 03/26/2015    Orientation RESPIRATION BLADDER Height & Weight     Self, Time, Situation, Place  O2 (Nasal Cannula) Continent, External catheter Weight: 117 lb 4.6 oz (53.2 kg) Height:  5\' 4"  (162.6 cm)  BEHAVIORAL SYMPTOMS/MOOD NEUROLOGICAL BOWEL NUTRITION STATUS      Continent Diet (See discharge summary)  AMBULATORY STATUS COMMUNICATION OF NEEDS Skin   Extensive Assist Verbally Normal                       Personal Care Assistance Level of Assistance  Bathing, Feeding, Dressing Bathing Assistance: Limited assistance Feeding assistance: Independent Dressing Assistance: Limited assistance      Functional Limitations Info  Sight, Hearing, Speech Sight Info: Adequate Hearing Info: Adequate Speech Info: Adequate    SPECIAL CARE FACTORS FREQUENCY  OT (By licensed OT), PT (By licensed PT)     PT Frequency: 5x a week OT Frequency: 5x a week            Contractures Contractures Info: Not present    Additional Factors Info  Code Status, Allergies, Isolation Precautions Code Status Info: FULL Allergies Info: Diclofenac, Macrodantin (Nitrofurantoin Macrocrystal), Tramadol, Codeine, Topamax (Topiramate)     Isolation Precautions Info: Enteric precautions (UV disinfection)     Current Medications (08/17/2019):  This is the current hospital active medication list Current Facility-Administered Medications  Medication Dose Route Frequency Provider Last Rate Last Admin  . 0.9 %  sodium chloride infusion  250 mL Intravenous Continuous Swayze, Ava, DO 10 mL/hr at 08/14/19 1747 250 mL at 08/14/19 1747  . ALPRAZolam 08/16/19) tablet 0.25 mg  0.25 mg Oral Daily PRN Swayze, Ava, DO   0.25 mg at 08/15/19 2334  . ampicillin-sulbactam (UNASYN) 1.5 g in sodium chloride 0.9 % 100 mL IVPB  1.5 g Intravenous Q6H Pokhrel, Laxman, MD 200 mL/hr at 08/17/19 1542 1.5 g at 08/17/19 1542  . [START ON 08/18/2019] budesonide (ENTOCORT EC) 24 hr capsule 9 mg  9 mg Oral QAC breakfast Pokhrel, Laxman, MD      . Chlorhexidine Gluconate Cloth 2 % PADS 6 each  6 each Topical Daily 08/20/2019, MD   6 each at 08/17/19 [phone]  . feeding supplement (BOOST / RESOURCE BREEZE) liquid  1 Container  1 Container Oral TID BM Pokhrel, Laxman, MD   1 Container at 08/17/19 1443  . furosemide (LASIX) injection 40 mg  40 mg Intravenous Daily Pokhrel, Laxman, MD   40 mg at 08/17/19 1443  . ipratropium-albuterol (DUONEB) 0.5-2.5 (3) MG/3ML nebulizer solution 3 mL  3 mL Nebulization Q4H PRN Darl Pikes, MD   3 mL at 08/15/19 0235  . magnesium oxide (MAG-OX) tablet 400 mg  400 mg Oral BID Pokhrel, Laxman, MD   400 mg at  08/17/19 1443  . multivitamin with minerals tablet 1 tablet  1 tablet Oral Daily Pokhrel, Laxman, MD   1 tablet at 08/17/19 0946  . ondansetron (ZOFRAN) tablet 4 mg  4 mg Oral Q6H PRN Swayze, Ava, DO       Or  . ondansetron (ZOFRAN) injection 4 mg  4 mg Intravenous Q6H PRN Swayze, Ava, DO      . sodium chloride flush (NS) 0.9 % injection 10-40 mL  10-40 mL Intracatheter Q12H Pokhrel, Laxman, MD   10 mL at 08/17/19 1445  . sodium chloride flush (NS) 0.9 % injection 10-40 mL  10-40 mL Intracatheter PRN Pokhrel, Laxman, MD      . verapamil (CALAN-SR) CR tablet 240 mg  240 mg Oral Daily Pokhrel, Laxman, MD   240 mg at 08/17/19 7782     Discharge Medications: Please see discharge summary for a list of discharge medications.  Relevant Imaging Results:  Relevant Lab Results:   Additional Information SSN [id number]  Dannette Barbara Gu [place], [place]

## 2019-08-17 NOTE — Care Management Important Message (Signed)
Important Message  Patient Details  Name: Brenda [name] MRN: [id number] Date of Birth: 10/01/1940   Medicare Important Message Given:  Yes     Leone Haven, RN 08/17/2019, 4:04 PM

## 2019-08-17 NOTE — Progress Notes (Signed)
The patient is doing better today.  She is eating something.  No further vomiting or diarrhea.  No stools in a few days.  She seems to be responding to intravenous fluids and intravenous antibiotics.  Her electrolyte disorder seems to be improved as is her dehydration.  I think looking at the whole picture she probably had an infectious enteritis.  I think we can stop her Solu-Medrol and put her back on budesonide 9 mg daily for her collagenous colitis.  She can follow-up with me in the office post discharge.  We will sign off now.  Call us if needed.

## 2019-08-17 NOTE — TOC Initial Note (Signed)
Transition of Care [facility] - Initial/Assessment Note    Patient Details  Name: Brenda [name] MRN: [id number] Date of Birth: 1941/01/18  [facility] CM/SW Contact:    Jacquelynn Cree Phone Number: 08/17/2019, 5:18 PM  Clinical Narrative:                 CSW met with patient, patient's spouse Shanon Brow and patient's nephew Zenia Resides. CSW informed patient and family of PT recommendation for short-term rehab at discharge.  Prior to arrival, patient lived with her spouse in a single story home. She uses a walker and is otherwise independent. Patient expressed understanding and is in agreement with SNF, having a preference for [facility], [facility] or [facility]. CSW explained insurance authorization process and where to find Medicare.gov ratings. Patient is vaccinated.   Expected Discharge Plan: Skilled Nursing Facility Barriers to Discharge: Ship broker, Continued Medical Work up   Patient Goals and CMS Choice   CMS Medicare.gov Compare Post Acute Care list provided to:: Patient Choice offered to / list presented to : Patient  Expected Discharge Plan and Services Expected Discharge Plan: [place]       Living arrangements for the past 2 months: Single Family Home                                      Prior Living Arrangements/Services Living arrangements for the past 2 months: Single Family Home Lives with:: Spouse Patient language and need for interpreter reviewed:: Yes Do you feel safe going back to the place where you live?: Yes      Need for Family Participation in Patient Care: No (Comment) Care giver support system in place?: Yes (comment)   Criminal Activity/Legal Involvement Pertinent to Current Situation/Hospitalization: No - Comment as needed  Activities of Daily Living Home Assistive Devices/Equipment: None ADL Screening (condition at time of admission) Patient's cognitive ability adequate to safely complete daily  activities?: Yes Is the patient deaf or have difficulty hearing?: No Does the patient have difficulty seeing, even when wearing glasses/contacts?: No Does the patient have difficulty concentrating, remembering, or making decisions?: No Patient able to express need for assistance with ADLs?: Yes Does the patient have difficulty dressing or bathing?: No Independently performs ADLs?: Yes (appropriate for developmental age) Does the patient have difficulty walking or climbing stairs?: No Weakness of Legs: None Weakness of Arms/Hands: None  Permission Sought/Granted Permission sought to share information with : Facility Sport and exercise psychologist, Family Supports Permission granted to share information with : Yes, Verbal Permission Granted  Share Information with NAME: Maryama Kuriakose  Permission granted to share info w AGENCY: SNFs  Permission granted to share info w Relationship: Spouse  Permission granted to share info w Contact Information: [phone]  Emotional Assessment Appearance:: Appears stated age Attitude/Demeanor/Rapport: Unable to Assess Affect (typically observed): Pleasant Orientation: : Oriented to Self, Oriented to Place, Oriented to  Time, Oriented to Situation Alcohol / Substance Use: Not Applicable Psych Involvement: No (comment)  Admission diagnosis:  Dehydration [E86.0] Volume depletion, gastrointestinal loss [E86.9] AKI (acute kidney injury) ([place]) [N17.9] Intractable nausea and vomiting [R11.2] Hypotension [I95.9] Diarrhea, unspecified type [R19.7] Patient Active Problem List   Diagnosis Date Noted  . Protein-calorie malnutrition, severe 08/15/2019  . Dehydration   . Diarrhea   . Shock circulatory ([place])   . AKI (acute kidney injury) ([place]) 08/13/2019  . Hiatal hernia with obstruction but no gangrene  08/13/2019  . Nausea vomiting and diarrhea 08/13/2019  . Volume depletion 08/13/2019  . Hypotension 08/13/2019  . Enteritis 08/13/2019  . Volume depletion,  gastrointestinal loss 08/13/2019  . Paraesophageal hiatal hernia s/p repair w biologic mesh 03/26/2015 03/27/2015  . GERD (gastroesophageal reflux disease) s/p Nissen fundoplication 0/03/5246 18/59/0931   PCP:  Alroy Dust, L.Marlou Sa, MD Pharmacy:   [id number] [address]Phone: [phone]: [phone]    Social Determinants of Health (SDOH) Interventions    Readmission Risk Interventions No flowsheet data found.

## 2019-08-17 NOTE — Progress Notes (Addendum)
PROGRESS NOTE  [name]:[id number] DOB: [birth year] DOA: 08/13/2019 PCP: Clovis Riley, L.August Saucer, MD   LOS: 4 days   Brief narrative: As per HPI,  Brenda [name] is a 79 y.o. female  with past medical history of GERD, status post Nissen fundoplication, paraesophageal hiatal hernia repair, acute kidney injury, enteritis, presented to hospital on 08/14/2019 with complaints of intractable nausea, vomiting and diarrhea for 4 weeks.   Patient was being treated by Dr. Bing Matter GI as outpatient.  During this admission, patient subsequently remained hypotensive despite 3 L of IV fluid and was started on Levophed and PCCM was consulted.   Patient remained in the ICU for vasopressors and was subsequently weaned off vasopressors.  She was then transferred out of the ICU.  CT abd / pelvis was negative for any acute issues.    Assessment/Plan:  Principal Problem:   Volume depletion Active Problems:   Paraesophageal hiatal hernia s/p repair w biologic mesh 03/26/2015   AKI (acute kidney injury) (HCC)   Hiatal hernia with obstruction but no gangrene   Nausea vomiting and diarrhea   Hypotension   Enteritis   Volume depletion, gastrointestinal loss   Protein-calorie malnutrition, severe   Hypotension -presumed to be hypovolemic secondary to intractable nausea, vomiting and diarrhea.  Received IV fluids/ vasopressors.  At this time, blood pressure has improved.  Has been started on verapamil.  Blood pressure seems to stable.  Shortness of breath, cough.  Patient is on Unasyn.  Will DC after 5-day course.  CXR showed showed  bibasilar opacity bilateral mild to moderate effusions and atelectasis. Was on room air and back on canula. Still with significant positive balance.  Will administer 1 dose of IV Lasix again today..  Intractable nausea/vomiting/diarrhea.  History of collagenous colitis.  Improved.  Has not had any bowel movements, nausea vomiting.  Tolerated full liquids.  Will advance as tolerated.   Recurrent hiatal hernia - despite hx of nissen fundoplication in 2017.  As needed Zofran.  GI on board.   UGI series with KUB -limited study.  Patient was on budesonide and Viberzi as outpatient.  Currently on IV Solu-Medrol.  GI on board and will discuss about steroids.  Advance diet as tolerated    Enteritis. Unable to collect stool sample due to lack of bowel movement for GI panel and C. difficile study.    AKI.  Resolved.  Latest creatinine of 0.8.  Hypokalemia and hypomagnesemia -secondary to vomiting/diarrhea.  Potassium has improved.  Magnesium 1.6.  Continue oral magnesium oxide.  Hyponatremia.  Improved.  Sodium of 136 today.  History of HTN, HLD. Continue to hold home atorvastatin, lisinopril.  Has been started on verapamil.  Blood pressure has improved.  Hx anxiety. - Continue alprazolam.  Deconditioning, debility, ambulatory dysfunction.  Patient uses walker at home.   PT evaluation recommended skilled nursing facility placement.  Addendum:  08/17/2019 1:56 PM  Spoke with Dr. Evette Cristal.  Recommend stopping IV Solu-Medrol.  Resume home budesonide.  DVT prophylaxis: SCDs Start: 08/13/19 1906, add heparin subcu.  Code Status: Full code  Family Communication: None today.  Spoke with the patient's husband yesterday.    Status is: Inpatient  Remains inpatient appropriate because:Unsafe d/c plan, IV treatments appropriate due to intensity of illness or inability to take PO and Inpatient level of care appropriate due to severity of illness   Dispo: The patient is from: Home              Anticipated d/c is to:  Skilled nursing facility              Anticipated d/c date is: 2 days.  Advance diet today.              Patient currently is not medically stable to d/c.   Consultants:  PCCM  GI  Procedures:  None  Antibiotics:   Unasyn IV  Anti-infectives (From admission, onward)   Start     Dose/Rate Route Frequency Ordered Stop   08/16/19 1000   ampicillin-sulbactam (UNASYN) 1.5 g in sodium chloride 0.9 % 100 mL IVPB     Discontinue     1.5 g 200 mL/hr over 30 Minutes Intravenous Every 6 hours 08/16/19 0823     08/14/19 1730  ampicillin-sulbactam (UNASYN) 1.5 g in sodium chloride 0.9 % 100 mL IVPB  Status:  Discontinued        1.5 g 200 mL/hr over 30 Minutes Intravenous Every 8 hours 08/14/19 1636 08/16/19 0823     Subjective:  Today, patient was seen and examined at bedside.  Still has not had a bowel movement.  Denies any nausea or overt vomiting.  On clear liquids.  Complains of generalized weakness.  Objective: Vitals:   08/17/19 0339 08/17/19 1109  BP: 128/74 128/81  Pulse: (!) 107 103  Resp: 19 15  Temp: 98.7 F (37.1 C) 97.8 F (36.6 C)  SpO2: 92% 92%    Intake/Output Summary (Last 24 hours) at 08/17/2019 1312 Last data filed at 08/17/2019 1307 Gross per 24 hour  Intake 1214.83 ml  Output 1490 ml  Net -275.17 ml   Filed Weights   08/15/19 0620 08/16/19 0339 08/17/19 0339  Weight: 57.7 kg 55.2 kg 53.2 kg   Body mass index is 20.13 kg/m.   Physical Exam: GENERAL: Patient is alert awake and communicative, not in obvious distress.  Thinly built.  On nasal cannula oxygen HENT: No scleral pallor or icterus. Pupils equally reactive to light. Oral mucosa is moist NECK: is supple, no gross swelling noted. CHEST:   Diminished breath sounds bilaterally.  No obvious crackles or wheezes noted CVS: S1 and S2 heard, no murmur. Regular rate and rhythm.  ABDOMEN: Soft, diffuse tenderness, mild and nonspecific bowel sounds are present. EXTREMITIES: No edema. CNS: Cranial nerves are intact.  Moving all extremities SKIN: warm and dry without rashes.  Data Review: I have personally reviewed the following laboratory data and studies,  CBC: Recent Labs  Lab 08/13/19 1447 08/13/19 1447 08/14/19 0252 08/14/19 0500 08/15/19 0058 08/16/19 0430 08/17/19 0448  WBC 7.7  --   --  5.3 7.0 5.8 7.7  NEUTROABS 5.6  --   --    --   --   --   --   HGB 11.9*   < > 10.5* 10.3* 10.8* 10.7* 10.2*  HCT 36.3   < > 31.0* 30.6* 32.7* 31.6* 31.1*  MCV 102.3*  --   --  101.7* 100.3* 99.4 101.0*  PLT 206  --   --  228 198 188 189   < > = values in this interval not displayed.   Basic Metabolic Panel: Recent Labs  Lab 08/13/19 1447 08/13/19 1447 08/13/19 1558 08/14/19 0252 08/14/19 0252 08/14/19 0500 08/14/19 0500 08/15/19 0058 08/15/19 0735 08/15/19 1157 08/16/19 0430 08/17/19 0448  NA 134*   < >  --  133*  --  129*  --  132*  --   --  135 136  K 2.8*   < >  --  4.0   < > 4.0   < > 6.1* 4.9 5.4* 4.5 4.4  CL 103  --   --   --   --  103  --  106  --   --  103 102  CO2 14*  --   --   --   --  16*  --  18*  --   --  21* 25  GLUCOSE 90  --   --   --   --  273*  --  198*  --   --  124* 152*  BUN 46*  --   --   --   --  28*  --  17  --   --  12 14  CREATININE 2.04*  --   --   --   --  1.16*  --  0.95  --   --  0.83 0.81  CALCIUM 6.5*  --   --   --   --  6.9*  --  10.1  --   --  12.0* 10.3  MG  --   --  1.6*  --   --  2.0  --  1.5*  --   --  1.6* 1.6*  PHOS  --   --   --   --   --  1.6*  --  4.0  --   --  3.7 3.6   < > = values in this interval not displayed.   Liver Function Tests: Recent Labs  Lab 08/13/19 1447 08/16/19 0430 08/17/19 0448  AST 29 15 15   ALT 17 13 12   ALKPHOS 83 74 75  BILITOT 1.2 0.9 0.4  PROT 5.8* 5.7* 6.0*  ALBUMIN 2.5* 2.4* 2.3*   Recent Labs  Lab 08/13/19 1447  LIPASE 32   No results for input(s): AMMONIA in the last 168 hours. Cardiac Enzymes: No results for input(s): CKTOTAL, CKMB, CKMBINDEX, TROPONINI in the last 168 hours. BNP (last 3 results) No results for input(s): BNP in the last 8760 hours.  ProBNP (last 3 results) No results for input(s): PROBNP in the last 8760 hours.  CBG: Recent Labs  Lab 08/13/19 1509 08/13/19 1653 08/15/19 0734  GLUCAP 74 106* 244*   Recent Results (from the past 240 hour(s))  Blood culture (routine x 2)     Status: None (Preliminary  result)   Collection Time: 08/13/19  2:54 PM   Specimen: BLOOD RIGHT WRIST  Result Value Ref Range Status   Specimen Description BLOOD RIGHT WRIST  Final   Special Requests   Final    BOTTLES DRAWN AEROBIC AND ANAEROBIC Blood Culture adequate volume   Culture   Final    NO GROWTH 3 DAYS Performed at [facility], [address], [place], [address] [place]    Report Status PENDING  Incomplete  Blood culture (routine x 2)     Status: None (Preliminary result)   Collection Time: 08/13/19  3:20 PM   Specimen: BLOOD RIGHT HAND  Result Value Ref Range Status   Specimen Description BLOOD RIGHT HAND  Final   Special Requests   Final    BOTTLES DRAWN AEROBIC AND ANAEROBIC Blood Culture results may not be optimal due to an inadequate volume of blood received in culture bottles   Culture   Final    NO GROWTH 3 DAYS Performed at [facility], [address] [place], [address] [place]    Report Status PENDING  Incomplete  SARS Coronavirus  2 by RT PCR (hospital order, performed in [facility] hospital lab) Nasopharyngeal Nasopharyngeal Swab     Status: None   Collection Time: 08/13/19  7:44 PM   Specimen: Nasopharyngeal Swab  Result Value Ref Range Status   SARS Coronavirus 2 NEGATIVE NEGATIVE Final    Comment: (NOTE) SARS-CoV-2 target nucleic acids are NOT DETECTED.  The SARS-CoV-2 RNA is generally detectable in upper and lower respiratory specimens during the acute phase of infection. The lowest concentration of SARS-CoV-2 viral copies this assay can detect is 250 copies / mL. A negative result does not preclude SARS-CoV-2 infection and should not be used as the sole basis for treatment or other patient management decisions.  A negative result may occur with improper specimen collection / handling, submission of specimen other than nasopharyngeal swab, presence of viral mutation(s) within the areas targeted by this assay, and inadequate number of viral copies (<250 copies /  mL). A negative result must be combined with clinical observations, patient history, and epidemiological information.  Fact Sheet for Patients:   BoilerBrush.com.cy  Fact Sheet for Healthcare Providers: https://pope.com/  This test is not yet approved or  cleared by the [place] FDA and has been authorized for detection and/or diagnosis of SARS-CoV-2 by FDA under an Emergency Use Authorization (EUA).  This EUA will remain in effect (meaning this test can be used) for the duration of the COVID-19 declaration under Section 564(b)(1) of the Act, 21 U.S.C. section 360bbb-3(b)(1), unless the authorization is terminated or revoked sooner.  Performed at [facility], [address] [place], [place] [address]   Urine culture     Status: None   Collection Time: 08/14/19 10:27 AM   Specimen: Urine, Random  Result Value Ref Range Status   Specimen Description URINE, RANDOM  Final   Special Requests NONE  Final   Culture   Final    NO GROWTH Performed at [facility], [address], [place], [place] [address]    Report Status 08/15/2019 FINAL  Final  MRSA PCR Screening     Status: None   Collection Time: 08/14/19 10:27 AM   Specimen: Nasopharyngeal  Result Value Ref Range Status   MRSA by PCR NEGATIVE NEGATIVE Final    Comment:        The GeneXpert MRSA Assay (FDA approved for NASAL specimens only), is one component of a comprehensive MRSA colonization surveillance program. It is not intended to diagnose MRSA infection nor to guide or monitor treatment for MRSA infections. Performed at [facility], [address], [place], [place] [address]      Studies: DG CHEST PORT 1 VIEW  Result Date: 08/15/2019 CLINICAL DATA:  79 year old female with shortness of breath. EXAM: PORTABLE CHEST 1 VIEW COMPARISON:  Chest radiograph dated 08/14/2019. FINDINGS: Small to moderate bilateral pleural effusions with associated  bibasilar atelectasis. Pneumonia is not excluded clinical correlation is recommended. There is mild cardiomegaly with mild vascular congestion. No pneumothorax. Atherosclerotic calcification of the aorta. No acute osseous pathology. IMPRESSION: 1. Cardiomegaly with mild vascular congestion and small to moderate bilateral pleural effusions. 2. Bibasilar atelectasis. Pneumonia is not excluded. Overall no significant interval change since the prior radiograph. Electronically Signed   By: Elgie Collard M.D.   On: 08/15/2019 15:59      Joycelyn Das, MD  [facility] 08/17/2019

## 2019-08-18 LAB — MAGNESIUM: Magnesium: 1.6 mg/dL — ABNORMAL LOW (ref 1.7–2.4)

## 2019-08-18 LAB — COMPREHENSIVE METABOLIC PANEL
ALT: 11 U/L (ref 0–44)
AST: 14 U/L — ABNORMAL LOW (ref 15–41)
Albumin: 2.1 g/dL — ABNORMAL LOW (ref 3.5–5.0)
Alkaline Phosphatase: 56 U/L (ref 38–126)
Anion gap: 8 (ref 5–15)
BUN: 16 mg/dL (ref 8–23)
CO2: 26 mmol/L (ref 22–32)
Calcium: 8.6 mg/dL — ABNORMAL LOW (ref 8.9–10.3)
Chloride: 102 mmol/L (ref 98–111)
Creatinine, Ser: 0.81 mg/dL (ref 0.44–1.00)
GFR calc Af Amer: >60 mL/min (ref >60)
GFR calc non Af Amer: >60 mL/min (ref >60)
Glucose, Bld: 101 mg/dL — ABNORMAL HIGH (ref 70–99)
Potassium: 3.4 mmol/L — ABNORMAL LOW (ref 3.5–5.1)
Sodium: 136 mmol/L (ref 135–145)
Total Bilirubin: 0.4 mg/dL (ref 0.3–1.2)
Total Protein: 4.8 g/dL — ABNORMAL LOW (ref 6.5–8.1)

## 2019-08-18 LAB — CBC
HCT: 29 % — ABNORMAL LOW (ref 36.0–46.0)
Hemoglobin: 9.3 g/dL — ABNORMAL LOW (ref 12.0–15.0)
MCH: 33.1 pg (ref 26.0–34.0)
MCHC: 32.1 g/dL (ref 30.0–36.0)
MCV: 103.2 fL — ABNORMAL HIGH (ref 80.0–100.0)
Platelets: 162 10*3/uL (ref 150–400)
RBC: 2.81 MIL/uL — ABNORMAL LOW (ref 3.87–5.11)
RDW: 12.4 % (ref 11.5–15.5)
WBC: 6.1 10*3/uL (ref 4.0–10.5)
nRBC: 0.3 % — ABNORMAL HIGH (ref 0.0–0.2)

## 2019-08-18 LAB — CULTURE, BLOOD (ROUTINE X 2)
Culture: NO GROWTH
Culture: NO GROWTH
Special Requests: ADEQUATE

## 2019-08-18 MED ORDER — POTASSIUM CHLORIDE CRYS ER 20 MEQ PO TBCR
40.0000 meq | EXTENDED_RELEASE_TABLET | Freq: Two times a day (BID) | ORAL | Status: DC
  Administered 2019-08-18 – 2019-08-19 (×2): 40 meq via ORAL
  Filled 2019-08-18 (×3): qty 2

## 2019-08-18 MED ORDER — MAGNESIUM SULFATE 2 GM/50ML IV SOLN
2.0000 g | Freq: Once | INTRAVENOUS | Status: AC
  Administered 2019-08-18: 2 g via INTRAVENOUS
  Filled 2019-08-18: qty 50

## 2019-08-18 MED ORDER — HEPARIN SODIUM (PORCINE) 5000 UNIT/ML IJ SOLN
5000.0000 [IU] | Freq: Three times a day (TID) | INTRAMUSCULAR | Status: DC
  Administered 2019-08-18 – 2019-08-23 (×15): 5000 [IU] via SUBCUTANEOUS
  Filled 2019-08-18 (×16): qty 1

## 2019-08-18 NOTE — Progress Notes (Signed)
PROGRESS NOTE  Brenda [name] EHU:[id number] DOB: [birth year] DOA: 08/13/2019 PCP: Clovis Riley, L.August Saucer, MD   LOS: 5 days   Brief narrative: As per HPI,  Brenda [name] is a 79 y.o. female  with past medical history of GERD, status post Nissen fundoplication, paraesophageal hiatal hernia repair, acute kidney injury, enteritis, presented to hospital on 08/14/2019 with complaints of intractable nausea, vomiting and diarrhea for 4 weeks.   Patient was being treated by Dr. Bing Matter GI as outpatient.  During this admission, patient subsequently remained hypotensive despite 3 L of IV fluid and was started on Levophed and PCCM was consulted.   Patient remained in the ICU for vasopressors and was subsequently weaned off vasopressors.  She was then transferred out of the ICU.  CT abd / pelvis was negative for any acute issues.    Assessment/Plan:  Principal Problem:   Volume depletion Active Problems:   Paraesophageal hiatal hernia s/p repair w biologic mesh 03/26/2015   AKI (acute kidney injury) (HCC)   Hiatal hernia with obstruction but no gangrene   Nausea vomiting and diarrhea   Hypotension   Enteritis   Volume depletion, gastrointestinal loss   Protein-calorie malnutrition, severe   Hypotension -presumed to be hypovolemic secondary to intractable nausea, vomiting and diarrhea.  Received IV fluids/ vasopressors.  At this time, blood pressure has improved.  Has been started on verapamil.  Blood pressure seems to stable.  Shortness of breath, cough.  Patient is on Unasyn.  Will DC after 5-day course.  CXR showed showed  bibasilar opacity bilateral mild to moderate effusions and atelectasis. . Still with significant positive balance.  Continue IV Lasix daily for now.  Wean oxygen as able.  Patient is  still 4992 mL positive.  Has been having good urinary output.  Intractable nausea/vomiting/diarrhea.  History of collagenous colitis.  Improved.  Recurrent hiatal hernia - despite hx of nissen  fundoplication in 2017.  As needed Zofran.  GI on board.   UGI series with KUB -limited study.  Patient was on budesonide and Viberzi as outpatient.  Received IV Solu-Medrol.  Has been advanced on soft diet.  Spoke with GI yesterday and recommend changing to budesonide p.o. Discontinued Solu-Medrol.  Tolerating p.o. diet except for some abdominal pain.  Enteritis. Unable to collect stool sample due to lack of bowel movement for GI panel and C. difficile study.    AKI.  Resolved.  Latest creatinine of 0.8.  Hypokalemia and hypomagnesemia -secondary to vomiting/diarrhea.  .  Magnesium 1.6.  Continue oral magnesium oxide.  Potassium of 3.4 today.  Will replenish with potassium supplements.  Hyponatremia.  Improved.  Sodium of 136   History of HTN, HLD. Continue to hold home atorvastatin, lisinopril.  Has been started on verapamil.  Blood pressure has improved.  Hx anxiety. - Continue alprazolam.  Deconditioning, debility, ambulatory dysfunction.  Patient uses walker at home.   PT evaluation recommended skilled nursing facility placement.   DVT prophylaxis: SCDs Start: 08/13/19 1906, add heparin subcu.  Code Status: Full code  Family Communication: .  Spoke with the patient's husband at bedside.  Status is: Inpatient  Remains inpatient appropriate because:Unsafe d/c plan, IV treatments appropriate due to intensity of illness or inability to take PO and Inpatient level of care appropriate due to severity of illness   Dispo: The patient is from: Home              Anticipated d/c is to: Skilled nursing facility  Anticipated d/c date is: 1-2 days.                Patient currently is medically stable for disposition.   Consultants:  PCCM  GI  Procedures:  None  Antibiotics:  . Unasyn IV  Anti-infectives (From admission, onward)   Start     Dose/Rate Route Frequency Ordered Stop   08/16/19 1000  ampicillin-sulbactam (UNASYN) 1.5 g in sodium chloride 0.9 % 100  mL IVPB     Discontinue     1.5 g 200 mL/hr over 30 Minutes Intravenous Every 6 hours 08/16/19 0823 08/18/19 2359   08/14/19 1730  ampicillin-sulbactam (UNASYN) 1.5 g in sodium chloride 0.9 % 100 mL IVPB  Status:  Discontinued        1.5 g 200 mL/hr over 30 Minutes Intravenous Every 8 hours 08/14/19 1636 08/16/19 0823     Subjective: Today, patient was seen and examined at bedside.  Feels less dyspneic and short winded.  Has had bowel movements..  Complains of generalized weakness.  Objective: Vitals:   08/17/19 1109 08/18/19 0419  BP: 128/81 (!) 120/64  Pulse: 103 92  Resp: 15 16  Temp: 97.8 F (36.6 C) 98.1 F (36.7 C)  SpO2: 92% 95%    Intake/Output Summary (Last 24 hours) at 08/18/2019 0817 Last data filed at 08/18/2019 0424 Gross per 24 hour  Intake 1003.88 ml  Output 1500 ml  Net -496.12 ml   Filed Weights   08/16/19 0339 08/17/19 0339 08/18/19 0419  Weight: 55.2 kg 53.2 kg 51.4 kg   Body mass index is 19.45 kg/m.   Physical Exam: GENERAL: Patient is alert awake and communicative, not in obvious distress.  Thinly built.  On nasal cannula oxygen HENT: No scleral pallor or icterus. Pupils equally reactive to light. Oral mucosa is moist NECK: is supple, no gross swelling noted. CHEST:   Diminished breath sounds bilaterally.  No obvious crackles or wheezes noted CVS: S1 and S2 heard, no murmur. Regular rate and rhythm.  ABDOMEN: Soft, diffuse tenderness, mild and nonspecific bowel sounds are present. EXTREMITIES: No edema. CNS: Cranial nerves are intact.  Moving all extremities SKIN: warm and dry without rashes.  Data Review: I have personally reviewed the following laboratory data and studies,  CBC: Recent Labs  Lab 08/13/19 1447 08/14/19 0252 08/14/19 0500 08/15/19 0058 08/16/19 0430 08/17/19 0448 08/18/19 0654  WBC 7.7  --  5.3 7.0 5.8 7.7 6.1  NEUTROABS 5.6  --   --   --   --   --   --   HGB 11.9*   < > 10.3* 10.8* 10.7* 10.2* 9.3*  HCT 36.3   < >  30.6* 32.7* 31.6* 31.1* 29.0*  MCV 102.3*  --  101.7* 100.3* 99.4 101.0* 103.2*  PLT 206  --  228 198 188 189 162   < > = values in this interval not displayed.   Basic Metabolic Panel: Recent Labs  Lab 08/13/19 1447 08/13/19 1447 08/13/19 1558 08/14/19 0252 08/14/19 0252 08/14/19 0500 08/14/19 0500 08/15/19 0058 08/15/19 0735 08/15/19 1157 08/16/19 0430 08/17/19 0448  NA 134*   < >  --  133*  --  129*  --  132*  --   --  135 136  K 2.8*   < >  --  4.0   < > 4.0   < > 6.1* 4.9 5.4* 4.5 4.4  CL 103  --   --   --   --  103  --  106  --   --  103 102  CO2 14*  --   --   --   --  16*  --  18*  --   --  21* 25  GLUCOSE 90  --   --   --   --  273*  --  198*  --   --  124* 152*  BUN 46*  --   --   --   --  28*  --  17  --   --  12 14  CREATININE 2.04*  --   --   --   --  1.16*  --  0.95  --   --  0.83 0.81  CALCIUM 6.5*  --   --   --   --  6.9*  --  10.1  --   --  12.0* 10.3  MG  --   --  1.6*  --   --  2.0  --  1.5*  --   --  1.6* 1.6*  PHOS  --   --   --   --   --  1.6*  --  4.0  --   --  3.7 3.6   < > = values in this interval not displayed.   Liver Function Tests: Recent Labs  Lab 08/13/19 1447 08/16/19 0430 08/17/19 0448  AST 29 15 15   ALT 17 13 12   ALKPHOS 83 74 75  BILITOT 1.2 0.9 0.4  PROT 5.8* 5.7* 6.0*  ALBUMIN 2.5* 2.4* 2.3*   Recent Labs  Lab 08/13/19 1447  LIPASE 32   No results for input(s): AMMONIA in the last 168 hours. Cardiac Enzymes: No results for input(s): CKTOTAL, CKMB, CKMBINDEX, TROPONINI in the last 168 hours. BNP (last 3 results) No results for input(s): BNP in the last 8760 hours.  ProBNP (last 3 results) No results for input(s): PROBNP in the last 8760 hours.  CBG: Recent Labs  Lab 08/13/19 1509 08/13/19 1653 08/15/19 0734  GLUCAP 74 106* 244*   Recent Results (from the past 240 hour(s))  Blood culture (routine x 2)     Status: None (Preliminary result)   Collection Time: 08/13/19  2:54 PM   Specimen: BLOOD RIGHT WRIST   Result Value Ref Range Status   Specimen Description BLOOD RIGHT WRIST  Final   Special Requests   Final    BOTTLES DRAWN AEROBIC AND ANAEROBIC Blood Culture adequate volume   Culture   Final    NO GROWTH 4 DAYS Performed at [facility], [address], [place], [address] [place]    Report Status PENDING  Incomplete  Blood culture (routine x 2)     Status: None (Preliminary result)   Collection Time: 08/13/19  3:20 PM   Specimen: BLOOD RIGHT HAND  Result Value Ref Range Status   Specimen Description BLOOD RIGHT HAND  Final   Special Requests   Final    BOTTLES DRAWN AEROBIC AND ANAEROBIC Blood Culture results may not be optimal due to an inadequate volume of blood received in culture bottles   Culture   Final    NO GROWTH 4 DAYS Performed at [facility], [address] [place], [address] [place]    Report Status PENDING  Incomplete  SARS Coronavirus 2 by RT PCR (hospital order, performed in [facility] hospital lab) Nasopharyngeal Nasopharyngeal Swab     Status: None   Collection Time: 08/13/19  7:44 PM   Specimen: Nasopharyngeal Swab  Result Value Ref Range Status   SARS Coronavirus 2 NEGATIVE NEGATIVE Final    Comment: (NOTE) SARS-CoV-2 target nucleic acids are NOT DETECTED.  The SARS-CoV-2 RNA is generally detectable in upper and lower respiratory specimens during the acute phase of infection. The lowest concentration of SARS-CoV-2 viral copies this assay can detect is 250 copies / mL. A negative result does not preclude SARS-CoV-2 infection and should not be used as the sole basis for treatment or other patient management decisions.  A negative result may occur with improper specimen collection / handling, submission of specimen other than nasopharyngeal swab, presence of viral mutation(s) within the areas targeted by this assay, and inadequate number of viral copies (<250 copies / mL). A negative result must be combined with clinical observations, patient  history, and epidemiological information.  Fact Sheet for Patients:   BoilerBrush.com.cy  Fact Sheet for Healthcare Providers: https://pope.com/  This test is not yet approved or  cleared by the [place] FDA and has been authorized for detection and/or diagnosis of SARS-CoV-2 by FDA under an Emergency Use Authorization (EUA).  This EUA will remain in effect (meaning this test can be used) for the duration of the COVID-19 declaration under Section 564(b)(1) of the Act, 21 U.S.C. section 360bbb-3(b)(1), unless the authorization is terminated or revoked sooner.  Performed at [facility], [address] [place], [place] [address]   Urine culture     Status: None   Collection Time: 08/14/19 10:27 AM   Specimen: Urine, Random  Result Value Ref Range Status   Specimen Description URINE, RANDOM  Final   Special Requests NONE  Final   Culture   Final    NO GROWTH Performed at [facility], [address] [place], [place] [address]    Report Status 08/15/2019 FINAL  Final  MRSA PCR Screening     Status: None   Collection Time: 08/14/19 10:27 AM   Specimen: Nasopharyngeal  Result Value Ref Range Status   MRSA by PCR NEGATIVE NEGATIVE Final    Comment:        The GeneXpert MRSA Assay (FDA approved for NASAL specimens only), is one component of a comprehensive MRSA colonization surveillance program. It is not intended to diagnose MRSA infection nor to guide or monitor treatment for MRSA infections. Performed at [facility], [address], [place], [place] [address]      Studies: No results found.    Joycelyn Das, MD  [facility] 08/18/2019

## 2019-08-18 NOTE — Plan of Care (Signed)
  Problem: Activity: Goal: Risk for activity intolerance will decrease Outcome: Progressing   Problem: Safety: Goal: Ability to remain free from injury will improve Outcome: Progressing   

## 2019-08-18 NOTE — Progress Notes (Signed)
RE: Brenda [name] Date of Birth: 2040/10/17 Date: 08/18/19  Please be advised that the above-named patient will require a short-term nursing home stay - anticipated 30 days or less for rehabilitation and strengthening.  The plan is for return home.

## 2019-08-19 LAB — COMPREHENSIVE METABOLIC PANEL
ALT: 19 U/L (ref 0–44)
AST: 23 U/L (ref 15–41)
Albumin: 2.1 g/dL — ABNORMAL LOW (ref 3.5–5.0)
Alkaline Phosphatase: 72 U/L (ref 38–126)
Anion gap: 6 (ref 5–15)
BUN: 12 mg/dL (ref 8–23)
CO2: 30 mmol/L (ref 22–32)
Calcium: 8.1 mg/dL — ABNORMAL LOW (ref 8.9–10.3)
Chloride: 101 mmol/L (ref 98–111)
Creatinine, Ser: 0.75 mg/dL (ref 0.44–1.00)
GFR calc Af Amer: >60 mL/min (ref >60)
GFR calc non Af Amer: >60 mL/min (ref >60)
Glucose, Bld: 107 mg/dL — ABNORMAL HIGH (ref 70–99)
Potassium: 3.8 mmol/L (ref 3.5–5.1)
Sodium: 137 mmol/L (ref 135–145)
Total Bilirubin: 0.6 mg/dL (ref 0.3–1.2)
Total Protein: 5.1 g/dL — ABNORMAL LOW (ref 6.5–8.1)

## 2019-08-19 LAB — CBC
HCT: 31.5 % — ABNORMAL LOW (ref 36.0–46.0)
Hemoglobin: 10.2 g/dL — ABNORMAL LOW (ref 12.0–15.0)
MCH: 33 pg (ref 26.0–34.0)
MCHC: 32.4 g/dL (ref 30.0–36.0)
MCV: 101.9 fL — ABNORMAL HIGH (ref 80.0–100.0)
Platelets: 168 10*3/uL (ref 150–400)
RBC: 3.09 MIL/uL — ABNORMAL LOW (ref 3.87–5.11)
RDW: 12.6 % (ref 11.5–15.5)
WBC: 6.1 10*3/uL (ref 4.0–10.5)
nRBC: 0 % (ref 0.0–0.2)

## 2019-08-19 LAB — MAGNESIUM: Magnesium: 2.1 mg/dL (ref 1.7–2.4)

## 2019-08-19 LAB — PHOSPHORUS: Phosphorus: 2.5 mg/dL (ref 2.5–4.6)

## 2019-08-19 MED ORDER — POTASSIUM CHLORIDE 20 MEQ PO PACK
40.0000 meq | PACK | Freq: Two times a day (BID) | ORAL | Status: AC
  Administered 2019-08-19 – 2019-08-20 (×2): 40 meq via ORAL
  Filled 2019-08-19 (×2): qty 2

## 2019-08-19 MED ORDER — FUROSEMIDE 10 MG/ML IJ SOLN
40.0000 mg | Freq: Two times a day (BID) | INTRAMUSCULAR | Status: DC
  Administered 2019-08-19 – 2019-08-21 (×3): 40 mg via INTRAVENOUS
  Filled 2019-08-19 (×4): qty 4

## 2019-08-19 NOTE — Progress Notes (Signed)
   08/19/19 0746  Assess: MEWS Score  Pulse Rate (!) 132  Level of Consciousness Alert  SpO2 (!) 86 %  O2 Device Room Air (trail walking in room)  Assess: MEWS Score  MEWS Temp 0  MEWS Systolic 0  MEWS Pulse 3  MEWS RR 0  MEWS LOC 0  MEWS Score 3  MEWS Score Color Yellow  Assess: if the MEWS score is Yellow or Red  Were vital signs taken at a resting state? No  Focused Assessment No change from prior assessment  Early Detection of Sepsis Score *See Row Information* Low  MEWS guidelines implemented *See Row Information* No, vital signs rechecked  Treat  Pain Scale 0-10  Pain Score 0  Document  Patient Outcome Stabilized after interventions  Progress note created (see row info) Yes  Patient quickly excerted when walking on room air stabilized after reapplying 02 and sitting.

## 2019-08-19 NOTE — Progress Notes (Addendum)
PROGRESS NOTE  Brenda [name] ZOX:[id number] DOB: 05-Aug-1940 DOA: 08/13/2019 PCP: Brenda [name], Brenda Saucerean, MD   LOS: 6 days   Brief narrative: As per HPI,  Brenda [name] is a 79 y.o. female  with past medical history of GERD, status post Nissen fundoplication, paraesophageal hiatal hernia repair, acute kidney injury, enteritis, presented to hospital on 08/14/2019 with complaints of intractable nausea, vomiting and diarrhea for 4 weeks.   Patient was being treated by Dr. Bing [facility] as outpatient.  During this admission, patient subsequently remained hypotensive despite 3 L of IV fluid and was started on Levophed and PCCM was consulted.   Patient remained in the ICU for vasopressors and was subsequently weaned off vasopressors.  She was then transferred out of the ICU.  CT abd / pelvis was negative for any acute issues.    Assessment/Plan:  Principal Problem:   Volume depletion Active Problems:   Paraesophageal hiatal hernia s/p repair w biologic mesh 03/26/2015   AKI (acute kidney injury) (HCC)   Hiatal hernia with obstruction but no gangrene   Nausea vomiting and diarrhea   Hypotension   Enteritis   Volume depletion, gastrointestinal loss   Protein-calorie malnutrition, severe   Hypotension -presumed to be hypovolemic secondary to intractable nausea, vomiting and diarrhea.  Received IV fluids/ vasopressors. Resolved. Now on verapamil. Tolerating well.  Shortness of breath, cough.  on Unasyn.  Completed course.  CXR showed showed  bibasilar opacity bilateral mild to moderate effusions and atelectasis. . Still with significant positive balance.  Continue IV Lasix daily for now.  Wean oxygen as able.  Patient is  still 5339 mL positive but has lost significant weight..  Changed lasix to  BID  Intractable nausea/vomiting/diarrhea.  History of collagenous colitis.  Improved.  Recurrent hiatal hernia - despite hx of nissen fundoplication in 2017.  As needed Zofran.  GI on board.   UGI series with  KUB -limited study.  Patient was on budesonide and Viberzi as outpatient.  Received IV Solu-Medrol.  Has been advanced on soft diet.  Resumed home budesonide..    Enteritis. Unable to collect stool sample due to lack of bowel movement for GI panel and C. difficile study.    AKI.  Resolved.  Latest creatinine of 0.7.  Hypokalemia and hypomagnesemia -secondary to vomiting/diarrhea.  . Improved.  Continue potassium and magnesium supplement  Hyponatremia.  Improved.  Sodium of 137  History of HTN, HLD. Continue to hold home atorvastatin, lisinopril. on verapamil.  Blood pressure has improved.  Hx anxiety.  - Continue alprazolam.  Deconditioning, debility, ambulatory dysfunction.  Patient uses walker at home.   PT evaluation recommended skilled nursing facility placement.   DVT prophylaxis: heparin injection 5,000 Units Start: 08/18/19 1430 SCDs Start: 08/13/19 1906, add heparin subcu.  Code Status: Full code  Family Communication: .  Spoke with the patient's husband at bedside.  Status is: Inpatient  Remains inpatient appropriate because:Unsafe d/c plan, IV treatments appropriate due to intensity of illness or inability to take PO and Inpatient level of care appropriate due to severity of illness   Dispo: The patient is from: Home              Anticipated d/c is to: Skilled nursing facility              Anticipated d/c date is: 1-2 days.                Patient currently is medically stable for disposition.  Consultants:  PCCM  GI  Procedures:  None  Antibiotics:  . Unasyn IV  Anti-infectives (From admission, onward)   Start     Dose/Rate Route Frequency Ordered Stop   08/16/19 1000  ampicillin-sulbactam (UNASYN) 1.5 g in sodium chloride 0.9 % 100 mL IVPB        1.5 g 200 mL/hr over 30 Minutes Intravenous Every 6 hours 08/16/19 0823 08/18/19 2359   08/14/19 1730  ampicillin-sulbactam (UNASYN) 1.5 g in sodium chloride 0.9 % 100 mL IVPB  Status:  Discontinued         1.5 g 200 mL/hr over 30 Minutes Intravenous Every 8 hours 08/14/19 1636 08/16/19 0823     Subjective: Today, patient was seen and examined at bedside.  Had bowel movement yesterday.  Denies overt shortness of breath cough but has weakness.  Objective: Vitals:   08/18/19 1934 08/19/19 0418  BP: 115/67 124/75  Pulse: 87 (!) 107  Resp: 18 18  Temp: 97.7 F (36.5 C) 98.2 F (36.8 C)  SpO2: 95% 90%    Intake/Output Summary (Last 24 hours) at 08/19/2019 0741 Last data filed at 08/19/2019 0400 Gross per 24 hour  Intake 847.2 ml  Output 1551 ml  Net -703.8 ml   Filed Weights   08/17/19 0339 08/18/19 0419 08/19/19 0418  Weight: 53.2 kg 51.4 kg 51 kg   Body mass index is 19.3 kg/m.   Physical Exam: GENERAL: Patient is alert awake and communicative, not in obvious distress.  Thinly built.  On nasal cannula oxygen HENT: No scleral pallor or icterus. Pupils equally reactive to light. Oral mucosa is moist NECK: is supple, no gross swelling noted. CHEST:   Diminished breath sounds bilaterally.   CVS: S1 and S2 heard, no murmur. Regular rate and rhythm.  ABDOMEN: Soft, diffuse tenderness, mild and nonspecific bowel sounds are present. EXTREMITIES: No edema. CNS: Cranial nerves are intact.  Moving all extremities SKIN: warm and dry without rashes.  Data Review: I have personally reviewed the following laboratory data and studies,  CBC: Recent Labs  Lab 08/13/19 1447 08/14/19 0252 08/15/19 0058 08/16/19 0430 08/17/19 0448 08/18/19 0654 08/19/19 0515  WBC 7.7   < > 7.0 5.8 7.7 6.1 6.1  NEUTROABS 5.6  --   --   --   --   --   --   HGB 11.9*   < > 10.8* 10.7* 10.2* 9.3* 10.2*  HCT 36.3   < > 32.7* 31.6* 31.1* 29.0* 31.5*  MCV 102.3*   < > 100.3* 99.4 101.0* 103.2* 101.9*  PLT 206   < > 198 188 189 162 168   < > = values in this interval not displayed.   Basic Metabolic Panel: Recent Labs  Lab 08/14/19 0500 08/14/19 0500 08/15/19 0058 08/15/19 0735 08/15/19 1157  08/16/19 0430 08/17/19 0448 08/18/19 0725 08/19/19 0515  NA 129*   < > 132*  --   --  135 136 136 137  K 4.0   < > 6.1*   < > 5.4* 4.5 4.4 3.4* 3.8  CL 103   < > 106  --   --  103 102 102 101  CO2 16*   < > 18*  --   --  21* 25 26 30   GLUCOSE 273*   < > 198*  --   --  124* 152* 101* 107*  BUN 28*   < > 17  --   --  12 14 16 12   CREATININE 1.16*   < > 0.95  --   --  0.83 0.81 0.81 0.75  CALCIUM 6.9*   < > 10.1  --   --  12.0* 10.3 8.6* 8.1*  MG 2.0   < > 1.5*  --   --  1.6* 1.6* 1.6* 2.1  PHOS 1.6*  --  4.0  --   --  3.7 3.6  --  2.5   < > = values in this interval not displayed.   Liver Function Tests: Recent Labs  Lab 08/13/19 1447 08/16/19 0430 08/17/19 0448 08/18/19 0725 08/19/19 0515  AST 29 15 15  14* 23  ALT 17 13 12 11 19   ALKPHOS 83 74 75 56 72  BILITOT 1.2 0.9 0.4 0.4 0.6  PROT 5.8* 5.7* 6.0* 4.8* 5.1*  ALBUMIN 2.5* 2.4* 2.3* 2.1* 2.1*   Recent Labs  Lab 08/13/19 1447  LIPASE 32   No results for input(s): AMMONIA in the last 168 hours. Cardiac Enzymes: No results for input(s): CKTOTAL, CKMB, CKMBINDEX, TROPONINI in the last 168 hours. BNP (last 3 results) No results for input(s): BNP in the last 8760 hours.  ProBNP (last 3 results) No results for input(s): PROBNP in the last 8760 hours.  CBG: Recent Labs  Lab 08/13/19 1509 08/13/19 1653 08/15/19 0734  GLUCAP 74 106* 244*   Recent Results (from the past 240 hour(s))  Blood culture (routine x 2)     Status: None   Collection Time: 08/13/19  2:54 PM   Specimen: BLOOD RIGHT WRIST  Result Value Ref Range Status   Specimen Description BLOOD RIGHT WRIST  Final   Special Requests   Final    BOTTLES DRAWN AEROBIC AND ANAEROBIC Blood Culture adequate volume   Culture   Final    NO GROWTH 5 DAYS Performed at [facility], [address], [place], [address] [place]    Report Status 08/18/2019 FINAL  Final  Blood culture (routine x 2)     Status: None   Collection Time: 08/13/19  3:20 PM    Specimen: BLOOD RIGHT HAND  Result Value Ref Range Status   Specimen Description BLOOD RIGHT HAND  Final   Special Requests   Final    BOTTLES DRAWN AEROBIC AND ANAEROBIC Blood Culture results may not be optimal due to an inadequate volume of blood received in culture bottles   Culture   Final    NO GROWTH 5 DAYS Performed at [facility], [address], [place], [address] [place]    Report Status 08/18/2019 FINAL  Final  SARS Coronavirus 2 by RT PCR (hospital order, performed in [facility] hospital lab) Nasopharyngeal Nasopharyngeal Swab     Status: None   Collection Time: 08/13/19  7:44 PM   Specimen: Nasopharyngeal Swab  Result Value Ref Range Status   SARS Coronavirus 2 NEGATIVE NEGATIVE Final    Comment: (NOTE) SARS-CoV-2 target nucleic acids are NOT DETECTED.  The SARS-CoV-2 RNA is generally detectable in upper and lower respiratory specimens during the acute phase of infection. The lowest concentration of SARS-CoV-2 viral copies this assay can detect is 250 copies / mL. A negative result does not preclude SARS-CoV-2 infection and should not be used as the sole basis for treatment or other patient management decisions.  A negative result may occur with improper specimen collection / handling, submission of specimen other than nasopharyngeal swab, presence of viral mutation(s) within the areas targeted by this assay, and inadequate number of viral copies (<250 copies / mL). A negative result must be combined with clinical observations,  patient history, and epidemiological information.  Fact Sheet for Patients:   BoilerBrush.com.cy  Fact Sheet for Healthcare Providers: https://pope.com/  This test is not yet approved or  cleared by the [place] FDA and has been authorized for detection and/or diagnosis of SARS-CoV-2 by FDA under an Emergency Use Authorization (EUA).  This EUA will remain in effect (meaning this  test can be used) for the duration of the COVID-19 declaration under Section 564(b)(1) of the Act, 21 U.S.C. section 360bbb-3(b)(1), unless the authorization is terminated or revoked sooner.  Performed at [facility], [address], [place], [place] [address]   Urine culture     Status: None   Collection Time: 08/14/19 10:27 AM   Specimen: Urine, Random  Result Value Ref Range Status   Specimen Description URINE, RANDOM  Final   Special Requests NONE  Final   Culture   Final    NO GROWTH Performed at [facility], [address], [place], [place] [address]    Report Status 08/15/2019 FINAL  Final  MRSA PCR Screening     Status: None   Collection Time: 08/14/19 10:27 AM   Specimen: Nasopharyngeal  Result Value Ref Range Status   MRSA by PCR NEGATIVE NEGATIVE Final    Comment:        The GeneXpert MRSA Assay (FDA approved for NASAL specimens only), is one component of a comprehensive MRSA colonization surveillance program. It is not intended to diagnose MRSA infection nor to guide or monitor treatment for MRSA infections. Performed at [facility], [address] [place], [place] [address]      Studies: No results found.    Joycelyn Das, MD  [facility] 08/19/2019

## 2019-08-19 NOTE — Plan of Care (Signed)
  Problem: Clinical Measurements: Goal: Diagnostic test results will improve Outcome: Progressing   Problem: Activity: Goal: Risk for activity intolerance will decrease Outcome: Progressing   

## 2019-08-20 ENCOUNTER — Inpatient Hospital Stay (HOSPITAL_COMMUNITY): Payer: Medicare PPO

## 2019-08-20 DIAGNOSIS — I340 Nonrheumatic mitral (valve) insufficiency: Secondary | ICD-10-CM

## 2019-08-20 DIAGNOSIS — I361 Nonrheumatic tricuspid (valve) insufficiency: Secondary | ICD-10-CM

## 2019-08-20 LAB — CBC
HCT: 32.8 % — ABNORMAL LOW (ref 36.0–46.0)
Hemoglobin: 10.8 g/dL — ABNORMAL LOW (ref 12.0–15.0)
MCH: 33.8 pg (ref 26.0–34.0)
MCHC: 32.9 g/dL (ref 30.0–36.0)
MCV: 102.5 fL — ABNORMAL HIGH (ref 80.0–100.0)
Platelets: 166 10*3/uL (ref 150–400)
RBC: 3.2 MIL/uL — ABNORMAL LOW (ref 3.87–5.11)
RDW: 12.5 % (ref 11.5–15.5)
WBC: 4.4 10*3/uL (ref 4.0–10.5)
nRBC: 0 % (ref 0.0–0.2)

## 2019-08-20 LAB — BASIC METABOLIC PANEL
Anion gap: 10 (ref 5–15)
BUN: 8 mg/dL (ref 8–23)
CO2: 29 mmol/L (ref 22–32)
Calcium: 7.7 mg/dL — ABNORMAL LOW (ref 8.9–10.3)
Chloride: 99 mmol/L (ref 98–111)
Creatinine, Ser: 0.86 mg/dL (ref 0.44–1.00)
GFR calc Af Amer: >60 mL/min (ref >60)
GFR calc non Af Amer: >60 mL/min (ref >60)
Glucose, Bld: 97 mg/dL (ref 70–99)
Potassium: 3.6 mmol/L (ref 3.5–5.1)
Sodium: 138 mmol/L (ref 135–145)

## 2019-08-20 LAB — ECHOCARDIOGRAM COMPLETE
Area-P 1/2: 4.41 cm2
Calc EF: 52.2 %
Height: 64 in
MV M vel: 5.18 m/s
MV Peak grad: 107.3 mmHg
S' Lateral: 3.14 cm
Single Plane A2C EF: 50.1 %
Single Plane A4C EF: 56 %
Weight: 1791.9 oz

## 2019-08-20 LAB — MAGNESIUM: Magnesium: 1.7 mg/dL (ref 1.7–2.4)

## 2019-08-20 MED ORDER — PERFLUTREN LIPID MICROSPHERE
1.0000 mL | INTRAVENOUS | Status: AC | PRN
  Administered 2019-08-20: 2 mL via INTRAVENOUS
  Filled 2019-08-20: qty 10

## 2019-08-20 MED ORDER — PRIMIDONE 50 MG PO TABS
100.0000 mg | ORAL_TABLET | Freq: Three times a day (TID) | ORAL | Status: DC
  Administered 2019-08-20 – 2019-08-23 (×10): 100 mg via ORAL
  Filled 2019-08-20 (×11): qty 2

## 2019-08-20 MED ORDER — ELUXADOLINE 100 MG PO TABS
100.0000 mg | ORAL_TABLET | Freq: Two times a day (BID) | ORAL | Status: DC
  Administered 2019-08-21 – 2019-08-23 (×6): 100 mg via ORAL
  Filled 2019-08-20 (×6): qty 1

## 2019-08-20 MED ORDER — PROSOURCE PLUS PO LIQD
30.0000 mL | Freq: Three times a day (TID) | ORAL | Status: DC
  Administered 2019-08-21 – 2019-08-23 (×3): 30 mL via ORAL
  Filled 2019-08-20 (×10): qty 30

## 2019-08-20 MED ORDER — FAMOTIDINE 20 MG PO TABS
40.0000 mg | ORAL_TABLET | Freq: Every day | ORAL | Status: DC
  Administered 2019-08-20 – 2019-08-23 (×4): 40 mg via ORAL
  Filled 2019-08-20 (×4): qty 2

## 2019-08-20 MED ORDER — APOAEQUORIN 10 MG PO CAPS: ORAL_CAPSULE | Freq: Every day | ORAL | Status: DC

## 2019-08-20 MED ORDER — CHOLESTYRAMINE 4 G PO PACK
1.0000 | PACK | Freq: Every day | ORAL | Status: DC
  Administered 2019-08-20 – 2019-08-23 (×4): 1 via ORAL
  Filled 2019-08-20 (×4): qty 1

## 2019-08-20 MED ORDER — POTASSIUM CHLORIDE 20 MEQ PO PACK
40.0000 meq | PACK | Freq: Two times a day (BID) | ORAL | Status: AC
  Administered 2019-08-20 – 2019-08-21 (×2): 40 meq via ORAL
  Filled 2019-08-20 (×2): qty 2

## 2019-08-20 NOTE — Progress Notes (Signed)
  Speech Language Pathology Treatment: Dysphagia  Patient Details Name: Brenda [name] MRN: [id number] DOB: 02-27-1940 Today's Date: 08/20/2019 Time: 1751-0258 SLP Time Calculation (min) (ACUTE ONLY): 14 min  Assessment / Plan / Recommendation Clinical Impression  Pt was seen for dysphagia treatment with her husband present. Pt's husband and nurse indicated that the pt has not been eating much. Pt's husband stated that he has never been a "big eater" and the pt indicated that she is fearful of eating too much since this has caused emesis in the past. She exhibited coughing with thin liquids via straw and indicated that this is how she "gets strangled" at home as was reported during the initial evaluation. A modified barium swallow study is recommended to further assess physiology and it is currently scheduled for today at 1400.    HPI HPI: Pt is a 79 yr old female with past medical history significant for hiatal hernia (s/p [employer] Fundoplication 03/2015), chronic diarrhea, IBS, paraesophageal hiatal hernia repair, GERD, essential tremor, colon polyps) who was admitted secondary to intractable nausea, vomiting, and diarrhea for the past 4 weeks which became particularly severe in the past week. CXR 7/28: Cardiomegaly with mild vascular congestion and small to moderate bilateral pleural effusions. Bibasilar atelectasis. Pneumonia is not excluded. Unasyn started due to concern for aspiration pneumonia. CT abdomen: Moderate size hiatal hernia containing a portion of the stomach.      SLP Plan  New goals to be determined pending instrumental study       Recommendations  Diet recommendations: Dysphagia 3 (mechanical soft);Thin liquid Liquids provided via: Cup;Straw Medication Administration: Whole meds with liquid Supervision: Staff to assist with self feeding Compensations: Slow rate;Follow solids with liquid Postural Changes and/or Swallow Maneuvers: Seated upright 90 degrees                 Oral Care Recommendations: Oral care BID Follow up Recommendations: Other (comment) (TBD) SLP Visit Diagnosis: Dysphagia, unspecified (R13.10) Plan: New goals to be determined pending instrumental study       Janaa Acero I. Vear Clock, MS, CCC-SLP Acute Rehabilitation Services Office number [phone] Pager [phone]                Scheryl Marten 08/20/2019, 12:56 PM

## 2019-08-20 NOTE — Progress Notes (Signed)
PT Cancellation Note  Patient Details Name: KEAISHA SUBLETTE MRN: [id number] DOB: 05/22/1940   Cancelled Treatment:    Reason Eval/Treat Not Completed: Other (comment).  PT has made two attempts, and pt has been in a procedure and will retry at another time.     Ivar Drape 08/20/2019, 3:09 PM   Samul Dada, PT MS Acute Rehab Dept. Number: [facility] [id number] and [facility] [phone]

## 2019-08-20 NOTE — Progress Notes (Signed)
  Echocardiogram 2D Echocardiogram has been performed.  Brenda [name] 08/20/2019, 10:40 AM

## 2019-08-20 NOTE — TOC Progression Note (Addendum)
Transition of Care [facility]) - Progression Note    Patient Details  Name: MARSHELLE BILGER MRN: [id number] Date of Birth: 04-22-1940  [facility] CM/SW Contact  Nonda Lou, [place] Phone Number: 08/20/2019, 2:33 PM  Clinical Narrative:    4:45p CSW was able to meet with patient and spouse [name] bedside to provide bed offers. [name] will review and provide to CSW tomorrow.  2:30p CSW attempted to meet with patient to discuss bed choices. Patient was gone for a procedure. CSW attempted to make contact with patient's husband [name] and left a voicemail.   Patient's insurance authorization is approved, pending bed choice.  Expected Discharge Plan: Skilled Nursing Facility Barriers to Discharge: English as a second language teacher, Continued Medical Work up  Expected Discharge Plan and Services Expected Discharge Plan: Skilled Nursing Facility       Living arrangements for the past 2 months: Single Family Home                                       Social Determinants of Health (SDOH) Interventions    Readmission Risk Interventions No flowsheet data found.

## 2019-08-20 NOTE — Progress Notes (Signed)
PROGRESS NOTE  [name] UXN:[id number] DOB: [birth year] DOA: 08/13/2019 PCP: Clovis Riley, L.August Saucer, MD   LOS: 7 days   Brief narrative: As per HPI,  Brenda [name] is a 79 y.o. female  with past medical history of GERD, status post Nissen fundoplication, paraesophageal hiatal hernia repair, acute kidney injury, enteritis, presented to hospital on 08/14/2019 with complaints of intractable nausea, vomiting and diarrhea for 4 weeks.   Patient was being treated by Dr. Bing Matter GI as outpatient.  During this admission, patient subsequently remained hypotensive despite 3 L of IV fluid and was started on Levophed and PCCM was consulted.   Patient remained in the ICU for vasopressors and was subsequently weaned off vasopressors.  She was then transferred out of the ICU.  CT abd / pelvis was negative for any acute issues.      Assessment/Plan:  Principal Problem:   Volume depletion Active Problems:   Paraesophageal hiatal hernia s/p repair w biologic mesh 03/26/2015   AKI (acute kidney injury) (HCC)   Hiatal hernia with obstruction but no gangrene   Nausea vomiting and diarrhea   Hypotension   Enteritis   Volume depletion, gastrointestinal loss   Protein-calorie malnutrition, severe   Hypotension -presumed to be hypovolemic secondary to intractable nausea, vomiting and diarrhea.  Received IV fluids/ vasopressors. Resolved. Now on verapamil. Tolerating well.  Shortness of breath, cough.  Improving.  Received Unasyn.  Completed course.  CXR showed showed  bibasilar opacity bilateral mild to moderate effusions and atelectasis. . Still with significant positive balance.  Continue IV Lasix for now. Change to oral on discharge.  11 pounds of weight loss but positive for 4841 mL. On room air now.  Nausea/vomiting/diarrhea/enteritis.  History of collagenous colitis.  Patient has been having some fevers with diarrhea.  Will restart the patient on Viberzi and Questran from home.  Recurrent hiatal hernia -  despite hx of nissen fundoplication in 2017.  As needed Zofran.    UGI series with KUB -limited study.  Patient was on budesonide and Viberzi as outpatient.  Received IV Solu-Medrol during hospitalization during hospitalization which has been changed to budesonide at this time..  On soft diet.  Tolerating.  AKI.  Resolved.  Latest creatinine of 0.8.  Hypokalemia and hypomagnesemia -secondary to vomiting/diarrhea. Improved with supplements  Hyponatremia.  Improved.  Sodium of 137  History of HTN, HLD. Continue to hold home atorvastatin, lisinopril.  Currently on verapamil.   Hx anxiety.  - Continue alprazolam.  Deconditioning, debility, ambulatory dysfunction.  Patient uses walker at home.   PT evaluation recommended skilled nursing facility placement.   DVT prophylaxis: heparin injection 5,000 Units Start: 08/18/19 1430 SCDs Start: 08/13/19 1906, add heparin subcu.  Code Status: Full code  Family Communication: .  Spoke with the patient's husband at bedside  Status is: Inpatient  Remains inpatient appropriate because Inpatient level of care appropriate due to severity of illness, await skilled nursing facility, recurrent diarrhoea, On IV lasix   Dispo: The patient is from: Home              Anticipated d/c is to: Skilled nursing facility              Anticipated d/c date is: 1-2 days if diarrhea slows down, on iv lasix              Patient currently is not medically stable for disposition.  Consultants:  PCCM  GI  Procedures:  None  Antibiotics:   Unasyn IV-  completed  Subjective: Today, patient was seen and examined at bedside.  Patient stated that he had loose bowel movements today today at least 2 episodes yesterday.  Objective: Vitals:   08/19/19 1948 08/20/19 0346  BP: (!) 146/86 115/66  Pulse: 94 96  Resp: 20   Temp: 97.9 F (36.6 C) 98 F (36.7 C)  SpO2: 100% 98%    Intake/Output Summary (Last 24 hours) at 08/20/2019 0717 Last data filed at  08/19/2019 2006 Gross per 24 hour  Intake 603 ml  Output 1101 ml  Net -498 ml   Filed Weights   08/18/19 0419 08/19/19 0418 08/20/19 0346  Weight: 51.4 kg 51 kg 50.8 kg   Body mass index is 19.22 kg/m.   Physical Exam: GENERAL: Patient is alert awake and communicative, not in obvious distress.  Thinly built.  On nasal cannula oxygen HENT: No scleral pallor or icterus. Pupils equally reactive to light. Oral mucosa is moist NECK: is supple, no gross swelling noted. CHEST:   Diminished breath sounds bilaterally.   CVS: S1 and S2 heard, no murmur. Regular rate and rhythm.  ABDOMEN: Soft,  Nonspecific tenderness, bowel sounds are present. EXTREMITIES: No edema. CNS: Cranial nerves are intact.  Moving all extremities SKIN: warm and dry without rashes.  Data Review: I have personally reviewed the following laboratory data and studies,  CBC: Recent Labs  Lab 08/13/19 1447 08/14/19 0252 08/16/19 0430 08/17/19 0448 08/18/19 0654 08/19/19 0515 08/20/19 0638  WBC 7.7   < > 5.8 7.7 6.1 6.1 4.4  NEUTROABS 5.6  --   --   --   --   --   --   HGB 11.9*   < > 10.7* 10.2* 9.3* 10.2* 10.8*  HCT 36.3   < > 31.6* 31.1* 29.0* 31.5* 32.8*  MCV 102.3*   < > 99.4 101.0* 103.2* 101.9* 102.5*  PLT 206   < > 188 189 162 168 166   < > = values in this interval not displayed.   Basic Metabolic Panel: Recent Labs  Lab 08/14/19 0500 08/14/19 0500 08/15/19 0058 08/15/19 0735 08/15/19 1157 08/16/19 0430 08/17/19 0448 08/18/19 0725 08/19/19 0515  NA 129*   < > 132*  --   --  135 136 136 137  K 4.0   < > 6.1*   < > 5.4* 4.5 4.4 3.4* 3.8  CL 103   < > 106  --   --  103 102 102 101  CO2 16*   < > 18*  --   --  21* 25 26 30   GLUCOSE 273*   < > 198*  --   --  124* 152* 101* 107*  BUN 28*   < > 17  --   --  12 14 16 12   CREATININE 1.16*   < > 0.95  --   --  0.83 0.81 0.81 0.75  CALCIUM 6.9*   < > 10.1  --   --  12.0* 10.3 8.6* 8.1*  MG 2.0   < > 1.5*  --   --  1.6* 1.6* 1.6* 2.1  PHOS 1.6*  --   4.0  --   --  3.7 3.6  --  2.5   < > = values in this interval not displayed.   Liver Function Tests: Recent Labs  Lab 08/13/19 1447 08/16/19 0430 08/17/19 0448 08/18/19 0725 08/19/19 0515  AST 29 15 15  14* 23  ALT 17 13 12 11 19   ALKPHOS 83 74 75  56 72  BILITOT 1.2 0.9 0.4 0.4 0.6  PROT 5.8* 5.7* 6.0* 4.8* 5.1*  ALBUMIN 2.5* 2.4* 2.3* 2.1* 2.1*   Recent Labs  Lab 08/13/19 1447  LIPASE 32   No results for input(s): AMMONIA in the last 168 hours. Cardiac Enzymes: No results for input(s): CKTOTAL, CKMB, CKMBINDEX, TROPONINI in the last 168 hours. BNP (last 3 results) No results for input(s): BNP in the last 8760 hours.  ProBNP (last 3 results) No results for input(s): PROBNP in the last 8760 hours.  CBG: Recent Labs  Lab 08/13/19 1509 08/13/19 1653 08/15/19 0734  GLUCAP 74 106* 244*   Recent Results (from the past 240 hour(s))  Blood culture (routine x 2)     Status: None   Collection Time: 08/13/19  2:54 PM   Specimen: BLOOD RIGHT WRIST  Result Value Ref Range Status   Specimen Description BLOOD RIGHT WRIST  Final   Special Requests   Final    BOTTLES DRAWN AEROBIC AND ANAEROBIC Blood Culture adequate volume   Culture   Final    NO GROWTH 5 DAYS Performed at [facility], [address], [place], [place] [address]    Report Status 08/18/2019 FINAL  Final  Blood culture (routine x 2)     Status: None   Collection Time: 08/13/19  3:20 PM   Specimen: BLOOD RIGHT HAND  Result Value Ref Range Status   Specimen Description BLOOD RIGHT HAND  Final   Special Requests   Final    BOTTLES DRAWN AEROBIC AND ANAEROBIC Blood Culture results may not be optimal due to an inadequate volume of blood received in culture bottles   Culture   Final    NO GROWTH 5 DAYS Performed at [facility], [address], [place], [place] [address]    Report Status 08/18/2019 FINAL  Final  SARS Coronavirus 2 by RT PCR (hospital order, performed in [facility] hospital lab)  Nasopharyngeal Nasopharyngeal Swab     Status: None   Collection Time: 08/13/19  7:44 PM   Specimen: Nasopharyngeal Swab  Result Value Ref Range Status   SARS Coronavirus 2 NEGATIVE NEGATIVE Final    Comment: (NOTE) SARS-CoV-2 target nucleic acids are NOT DETECTED.  The SARS-CoV-2 RNA is generally detectable in upper and lower respiratory specimens during the acute phase of infection. The lowest concentration of SARS-CoV-2 viral copies this assay can detect is 250 copies / mL. A negative result does not preclude SARS-CoV-2 infection and should not be used as the sole basis for treatment or other patient management decisions.  A negative result may occur with improper specimen collection / handling, submission of specimen other than nasopharyngeal swab, presence of viral mutation(s) within the areas targeted by this assay, and inadequate number of viral copies (<250 copies / mL). A negative result must be combined with clinical observations, patient history, and epidemiological information.  Fact Sheet for Patients:   BoilerBrush.com.cy  Fact Sheet for Healthcare Providers: https://pope.com/  This test is not yet approved or  cleared by the [place] FDA and has been authorized for detection and/or diagnosis of SARS-CoV-2 by FDA under an Emergency Use Authorization (EUA).  This EUA will remain in effect (meaning this test can be used) for the duration of the COVID-19 declaration under Section 564(b)(1) of the Act, 21 U.S.C. section 360bbb-3(b)(1), unless the authorization is terminated or revoked sooner.  Performed at [facility], [address], [place], [place] [address]   Urine  culture     Status: None   Collection Time: 08/14/19 10:27 AM   Specimen: Urine, Random  Result Value Ref Range Status   Specimen Description URINE, RANDOM  Final   Special Requests NONE  Final   Culture   Final    NO GROWTH Performed at  [facility], [address] [place], [place] [address]    Report Status 08/15/2019 FINAL  Final  MRSA PCR Screening     Status: None   Collection Time: 08/14/19 10:27 AM   Specimen: Nasopharyngeal  Result Value Ref Range Status   MRSA by PCR NEGATIVE NEGATIVE Final    Comment:        The GeneXpert MRSA Assay (FDA approved for NASAL specimens only), is one component of a comprehensive MRSA colonization surveillance program. It is not intended to diagnose MRSA infection nor to guide or monitor treatment for MRSA infections. Performed at [facility], [address] [place], [place] [address]      Studies: No results found.   Joycelyn Das, MD  [facility] 08/20/2019

## 2019-08-20 NOTE — Progress Notes (Addendum)
Modified Barium Swallow Progress Note  Patient Details  Name: Brenda [name] MRN: [id number] Date of Birth: 11-Apr-1940  Today's Date: 08/20/2019  Modified Barium Swallow completed.  Full report located under Chart Review in the Imaging Section.  Brief recommendations include the following:  Clinical Impression  Pt was intermittently tearful throughout the study and required encouragement to participate in the evaluation. She stated that she "would do better" if her husband were present. Pt's unit and nurse were contacted; however, pt's husband was not in the room and she indicated that would continue without him.  The study was somewhat limited due to pt spitting out dysphagia 1 solids, refusing consecutive swallows and taking very small boluses of regular texture solids. She presented with oropharyngeal dysphagia characterized by piecemeal deglutition, reduced bolus cohesion with premature spillage, and a mild pharyngeal delay which together resulted in an episode of sensed aspiration (PAS 6) with the initial bolus of thin liquids via cup and subsequent expulsion with coughing. However, no subsequent instances of laryngeal invasion were noted with solids or liquids via straw and her oropharyngeal swallow mechanism was otherwise within functional limits. Screening of the esophageal phase of the pt's swallow revealed stasis of the 71mm barium tablet in the upper thoracic esophagus. The tablet was resistant to movement with additional intake of thin liquids or with nectar thick liquids, but was ultimately transported to the stomach following intake of an additional bolus of dysphagia 3 solids.    Swallow Evaluation Recommendations       SLP Diet Recommendations: Dysphagia 3 (Mech soft) solids;Thin liquid   Liquid Administration via: Cup;No straw   Medication Administration: Whole meds with liquid   Supervision: Patient able to self feed   Compensations: Small sips/bites;Slow rate;Follow solids  with liquid   Postural Changes: Seated upright at 90 degrees   Oral Care Recommendations: Oral care BID      Larene Ascencio I. Vear Clock, MS, CCC-SLP Acute Rehabilitation Services Office number [phone] Pager [phone]  Scheryl Marten 08/20/2019,5:35 PM

## 2019-08-20 NOTE — Progress Notes (Signed)
SLP Cancellation Note  Patient Details Name: Brenda [name] MRN: [id number] DOB: 11-Jul-1940   Cancelled treatment:       Reason Eval/Treat Not Completed: Patient at procedure or test/unavailable (Pt with RN being assisted with toileting. SLP will follow up)  Jrue Jarriel I. Vear Clock, MS, CCC-SLP Acute Rehabilitation Services Office number [phone] Pager [phone]  Scheryl Marten 08/20/2019, 11:03 AM

## 2019-08-20 NOTE — Social Work (Signed)
Pasrr received [phone] [id number].  Verlon Au, LCSWA Clinical Social Worker

## 2019-08-20 NOTE — Progress Notes (Signed)
Pt experiencing loose stools again collected a GI Panel sample

## 2019-08-20 NOTE — Progress Notes (Signed)
PHARMACIST - PHYSICIAN ORDER COMMUNICATION  CONCERNING: P&T Medication Policy on Herbal Medications  DESCRIPTION:  This patient's order for: Prevagen  has been noted.  This product(s) is classified as an "herbal" or natural product/ supplement. Due to a lack of definitive safety studies or FDA approval, nonstandard manufacturing practices, plus the potential risk of unknown drug-drug interactions while on inpatient medications, the Pharmacy and Therapeutics Committee does not permit the use of "herbal" or natural products of this type within [facility].   ACTION TAKEN: The pharmacy department is unable to verify this order at this time. Please reevaluate patient's clinical condition at discharge and address if the herbal or natural product(s) should be resumed at that time.   Hilarie Fredrickson, RPh 08/20/2019 2:14 PM

## 2019-08-20 NOTE — Progress Notes (Signed)
Nutrition Follow-up  DOCUMENTATION CODES:   Severe malnutrition in context of chronic illness  INTERVENTION:   -MVI with minerals daily -Continue Boost Breeze po TID, each supplement provides 250 kcal and 9 grams of protein -30 ml Prosource Plus TID, each supplement provides 100 kcals and 15 grams protein  NUTRITION DIAGNOSIS:   Severe Malnutrition related to chronic illness (colitis) as evidenced by severe fat depletion, severe muscle depletion, energy intake < or equal to 75% for > or equal to 1 month.  Ongoing  GOAL:   Patient will meet greater than or equal to 90% of their needs  Unmet  MONITOR:   PO intake, Supplement acceptance, Weight trends, Labs, Diet advancement, I & O's  REASON FOR ASSESSMENT:   Malnutrition Screening Tool    ASSESSMENT:   Patient with PMH significant for GERD s/p Nissen fundoplication, chronic diarrhea, paraesophageal hiatal hernia s/p repair, collagenous colitis, and HTN. Presents this admission with nausea, vomiting, diarrhea resulting in dehydration.  7/29- advanced to full liquid diet 7/30- advanced to soft diet  Reviewed I/O's: -498 ml x 24 hours and +4.8 L since admission  UOP: 1.1 L x 24 hours  Case discussed with RN, who requests RD re-evaluate pt, as pt with very poor oral intake and is refusing supplements.   Pt receiving nursing care at time of visit. Also ttempted to speak with pt via hospital room phone, however, no answer.   Per SLP notes, plan for MBSS today. Pt reports she is afraid to eat much secondary to emesis. Noted meal completion poor; PO: 0-50% (averaging 25% meal completion). Pt with history of early satiety.   Labs reviewed.   Diet Order:   Diet Order            DIET SOFT Room service appropriate? Yes; Fluid consistency: Thin  Diet effective now                 EDUCATION NEEDS:   Not appropriate for education at this time  Skin:  Skin Assessment: Reviewed RN Assessment  Last BM:   08/19/19  Height:   Ht Readings from Last 1 Encounters:  08/13/19 5\' 4"  (1.626 m)    Weight:   Wt Readings from Last 1 Encounters:  08/20/19 50.8 kg   BMI:  Body mass index is 19.22 kg/m.  Estimated Nutritional Needs:   Kcal:  1600-1800 kcal  Protein:  80-95 grams  Fluid:  >/= 1.6 L/day    10/20/19, RD, LDN, CDCES Registered Dietitian II Certified Diabetes Care and Education Specialist Please refer to [facility] for RD and/or RD on-call/weekend/after hours pager

## 2019-08-21 LAB — BASIC METABOLIC PANEL
Anion gap: 7 (ref 5–15)
BUN: 6 mg/dL — ABNORMAL LOW (ref 8–23)
CO2: 26 mmol/L (ref 22–32)
Calcium: 7.6 mg/dL — ABNORMAL LOW (ref 8.9–10.3)
Chloride: 102 mmol/L (ref 98–111)
Creatinine, Ser: 0.84 mg/dL (ref 0.44–1.00)
GFR calc Af Amer: >60 mL/min (ref >60)
GFR calc non Af Amer: >60 mL/min (ref >60)
Glucose, Bld: 100 mg/dL — ABNORMAL HIGH (ref 70–99)
Potassium: 4.2 mmol/L (ref 3.5–5.1)
Sodium: 135 mmol/L (ref 135–145)

## 2019-08-21 LAB — CBC
HCT: 32.3 % — ABNORMAL LOW (ref 36.0–46.0)
Hemoglobin: 10.4 g/dL — ABNORMAL LOW (ref 12.0–15.0)
MCH: 32.9 pg (ref 26.0–34.0)
MCHC: 32.2 g/dL (ref 30.0–36.0)
MCV: 102.2 fL — ABNORMAL HIGH (ref 80.0–100.0)
Platelets: 166 10*3/uL (ref 150–400)
RBC: 3.16 MIL/uL — ABNORMAL LOW (ref 3.87–5.11)
RDW: 12.6 % (ref 11.5–15.5)
WBC: 4.3 10*3/uL (ref 4.0–10.5)
nRBC: 0 % (ref 0.0–0.2)

## 2019-08-21 LAB — MAGNESIUM: Magnesium: 1.7 mg/dL (ref 1.7–2.4)

## 2019-08-21 MED ORDER — POTASSIUM CHLORIDE 20 MEQ PO PACK
20.0000 meq | PACK | Freq: Every day | ORAL | Status: AC
  Administered 2019-08-22 – 2019-08-23 (×2): 20 meq via ORAL
  Filled 2019-08-21 (×2): qty 1

## 2019-08-21 MED ORDER — FUROSEMIDE 10 MG/ML IJ SOLN
40.0000 mg | Freq: Every day | INTRAMUSCULAR | Status: DC
  Administered 2019-08-22: 40 mg via INTRAVENOUS
  Filled 2019-08-21: qty 4

## 2019-08-21 NOTE — Plan of Care (Signed)
  Problem: Activity: Goal: Risk for activity intolerance will decrease Outcome: Progressing   

## 2019-08-21 NOTE — TOC Progression Note (Addendum)
Transition of Care [facility]) - Progression Note    Patient Details  Name: Brenda [name] MRN: [id number] Date of Birth: 12/29/1940  [facility] of [facility]) CM/SW Contact  Levada Schilling Phone Number: 08/21/2019, 1:27 PM  Clinical Narrative:    Pt and pt's spouse chose [facility] for SNF.  [facility] will have a bed for pt on Thursday. CSW spoke with pt's spouse concerning choosing another SNF for disposition.  Pt's spouse prefers [facility] for pt's needs. Pt's insurance auth 8/2-8/4, review 08/22/19. [facility] Health Reference  ID# [id number], Navi Case Mgr Caryl Ada, fax# [phone]   Expected Discharge Plan: Skilled Nursing Facility Barriers to Discharge: Insurance Authorization, Continued Medical Work up  Expected Discharge Plan and Services Expected Discharge Plan: Skilled Nursing Facility       Living arrangements for the past 2 months: Single Family Home                                       Social Determinants of Health (SDOH) Interventions    Readmission Risk Interventions No flowsheet data found.

## 2019-08-21 NOTE — Progress Notes (Signed)
PROGRESS NOTE  Brenda [name] GQQ:[id number] DOB: 1940/11/26 DOA: 08/13/2019 PCP: Clovis Riley, L.August Saucer, MD   LOS: 8 days   Brief narrative: As per HPI,  Brenda [name] is a 79 y.o. female  with past medical history of GERD, status post Nissen fundoplication, paraesophageal hiatal hernia repair, acute kidney injury, enteritis, presented to hospital on 08/14/2019 with complaints of intractable nausea, vomiting and diarrhea for 4 weeks.   Patient was being treated by Dr. Bing Matter GI as outpatient.  During this admission, patient subsequently remained hypotensive despite 3 L of IV fluid and was started on Levophed and PCCM was consulted.   Patient remained in the ICU for vasopressors and was subsequently weaned off vasopressors.  She was then transferred out of the ICU.  CT abd / pelvis was negative for any acute issues.      Assessment/Plan:  Principal Problem:   Volume depletion Active Problems:   Paraesophageal hiatal hernia s/p repair w biologic mesh 03/26/2015   AKI (acute kidney injury) (HCC)   Hiatal hernia with obstruction but no gangrene   Nausea vomiting and diarrhea   Hypotension   Enteritis   Volume depletion, gastrointestinal loss   Protein-calorie malnutrition, severe   Hypotension -presumed to be hypovolemic secondary to intractable nausea, vomiting and diarrhea.  Received IV fluids/ vasopressors. Resolved. Now on verapamil. Tolerating well.  Shortness of breath, cough.  Improving.  Received Unasyn- completed course for possible pneumonia.  CXR showed showed  bibasilar opacity bilateral mild to moderate effusions and atelectasis.  Continue IV Lasix for now. Change to oral on discharge.  23 pounds of weight loss documented but positive for 3221 mL. On room air now.  Change Lasix IV to once a day  Nausea/vomiting/diarrhea/enteritis.  History of collagenous colitis.  Had diarrhea yesterday after many days of constipation.  Has been restarted on Viberzi and Questran from home.   Improved today.  Received IV Solu-Medrol which has been changed to home budesonide as per GI recommendation.  Patient will need to follow-up with GI as outpatient.  AKI.  Secondary to nausea vomiting and diarrhea improved after IV fluids.   Latest creatinine of 0.8.  Monitor closely on IV diuretics.  Hypokalemia and hypomagnesemia -secondary to vomiting/diarrhea. Improved with supplements  Hyponatremia.  Improved.  Sodium of 135  History of HTN, HLD.  home atorvastatin, lisinopril on hold.  Currently on verapamil.  Blood pressure marginally low.  Hx anxiety.  - Continue alprazolam.  Deconditioning, debility, ambulatory dysfunction.  Patient uses walker at home.   PT evaluation recommended skilled nursing facility placement.  DVT prophylaxis: heparin injection 5,000 Units Start: 08/18/19 1430 SCDs Start: 08/13/19 1906, add heparin subcu.  Code Status: Full code  Family Communication:  Spoke with the patient's husband at bedside  Status is: Inpatient  Remains inpatient appropriate because Inpatient level of care appropriate due to severity of illness, await skilled nursing facility  Dispo: The patient is from: Home              Anticipated d/c is to: Skilled nursing facility              Anticipated d/c date is:1-2 days              Patient currently is  medically stable for disposition.  Will order for Covid test.  Spoke with transition of care.  Likely will have bed on Thursday at [facility] health care.  Consultants:  PCCM  GI  Procedures:  None  Antibiotics:  .  Unasyn IV- completed  Subjective:  Today, patient was seen and examined today.  Patient states that her bowel movements have slowed down after medication.  Off supplemental oxygen.  Feels much better.  Objective: Vitals:   08/21/19 0424 08/21/19 1225  BP: (!) 106/54 (!) 93/54  Pulse: 83 87  Resp:  18  Temp: 98.4 F (36.9 C) 98 F (36.7 C)  SpO2: 95% 96%    Intake/Output Summary (Last 24 hours) at  08/21/2019 1339 Last data filed at 08/21/2019 0857 Gross per 24 hour  Intake 580 ml  Output 1800 ml  Net -1220 ml   Filed Weights   08/19/19 0418 08/20/19 0346 08/21/19 0424  Weight: 51 kg 50.8 kg 47.3 kg   Body mass index is 17.9 kg/m.   Physical Exam: General: Thinly built, not in obvious distress HENT:   No scleral pallor or icterus noted. Oral mucosa is moist.  Chest:    Diminished breath sounds bilaterally.  CVS: S1 &S2 heard. No murmur.  Regular rate and rhythm. Abdomen: Soft, nontender, nondistended.  Bowel sounds are heard.   Extremities: No cyanosis, clubbing or edema.  Peripheral pulses are palpable. Psych: Alert, awake and communicative CNS:  No cranial nerve deficits.  Power equal in all extremities.   Skin: Warm and dry.  No rashes noted.  Data Review: I have personally reviewed the following laboratory data and studies,  CBC: Recent Labs  Lab 08/17/19 0448 08/18/19 0654 08/19/19 0515 08/20/19 0638 08/21/19 0500  WBC 7.7 6.1 6.1 4.4 4.3  HGB 10.2* 9.3* 10.2* 10.8* 10.4*  HCT 31.1* 29.0* 31.5* 32.8* 32.3*  MCV 101.0* 103.2* 101.9* 102.5* 102.2*  PLT 189 162 168 166 166   Basic Metabolic Panel: Recent Labs  Lab 08/15/19 0058 08/15/19 0735 08/16/19 0430 08/16/19 0430 08/17/19 0448 08/18/19 0725 08/19/19 0515 08/20/19 0638 08/21/19 0500  NA 132*  --  135   < > 136 136 137 138 135  K 6.1*   < > 4.5   < > 4.4 3.4* 3.8 3.6 4.2  CL 106  --  103   < > 102 102 101 99 102  CO2 18*  --  21*   < > 25 26 30 29 26   GLUCOSE 198*  --  124*   < > 152* 101* 107* 97 100*  BUN 17  --  12   < > 14 16 12 8  6*  CREATININE 0.95  --  0.83   < > 0.81 0.81 0.75 0.86 0.84  CALCIUM 10.1  --  12.0*   < > 10.3 8.6* 8.1* 7.7* 7.6*  MG 1.5*  --  1.6*   < > 1.6* 1.6* 2.1 1.7 1.7  PHOS 4.0  --  3.7  --  3.6  --  2.5  --   --    < > = values in this interval not displayed.   Liver Function Tests: Recent Labs  Lab 08/16/19 0430 08/17/19 0448 08/18/19 0725 08/19/19 0515  AST  15 15 14* 23  ALT 13 12 11 19   ALKPHOS 74 75 56 72  BILITOT 0.9 0.4 0.4 0.6  PROT 5.7* 6.0* 4.8* 5.1*  ALBUMIN 2.4* 2.3* 2.1* 2.1*   No results for input(s): LIPASE, AMYLASE in the last 168 hours. No results for input(s): AMMONIA in the last 168 hours. Cardiac Enzymes: No results for input(s): CKTOTAL, CKMB, CKMBINDEX, TROPONINI in the last 168 hours. BNP (last 3 results) No results for input(s): BNP in the last 8760 hours.  ProBNP (last 3 results) No results for input(s): PROBNP in the last 8760 hours.  CBG: Recent Labs  Lab 08/15/19 0734  GLUCAP 244*   Recent Results (from the past 240 hour(s))  Blood culture (routine x 2)     Status: None   Collection Time: 08/13/19  2:54 PM   Specimen: BLOOD RIGHT WRIST  Result Value Ref Range Status   Specimen Description BLOOD RIGHT WRIST  Final   Special Requests   Final    BOTTLES DRAWN AEROBIC AND ANAEROBIC Blood Culture adequate volume   Culture   Final    NO GROWTH 5 DAYS Performed at [facility], [address] [place], [place] [address]    Report Status 08/18/2019 FINAL  Final  Blood culture (routine x 2)     Status: None   Collection Time: 08/13/19  3:20 PM   Specimen: BLOOD RIGHT HAND  Result Value Ref Range Status   Specimen Description BLOOD RIGHT HAND  Final   Special Requests   Final    BOTTLES DRAWN AEROBIC AND ANAEROBIC Blood Culture results may not be optimal due to an inadequate volume of blood received in culture bottles   Culture   Final    NO GROWTH 5 DAYS Performed at [facility], [address] [place], [place] [address]    Report Status 08/18/2019 FINAL  Final  SARS Coronavirus 2 by RT PCR (hospital order, performed in [facility] hospital lab) Nasopharyngeal Nasopharyngeal Swab     Status: None   Collection Time: 08/13/19  7:44 PM   Specimen: Nasopharyngeal Swab  Result Value Ref Range Status   SARS Coronavirus 2 NEGATIVE NEGATIVE Final    Comment: (NOTE) SARS-CoV-2 target nucleic  acids are NOT DETECTED.  The SARS-CoV-2 RNA is generally detectable in upper and lower respiratory specimens during the acute phase of infection. The lowest concentration of SARS-CoV-2 viral copies this assay can detect is 250 copies / mL. A negative result does not preclude SARS-CoV-2 infection and should not be used as the sole basis for treatment or other patient management decisions.  A negative result may occur with improper specimen collection / handling, submission of specimen other than nasopharyngeal swab, presence of viral mutation(s) within the areas targeted by this assay, and inadequate number of viral copies (<250 copies / mL). A negative result must be combined with clinical observations, patient history, and epidemiological information.  Fact Sheet for Patients:   BoilerBrush.com.cyhttps://www.fda.gov/media/136312/download  Fact Sheet for Healthcare Providers: https://pope.com/https://www.fda.gov/media/136313/download  This test is not yet approved or  cleared by the [place] FDA and has been authorized for detection and/or diagnosis of SARS-CoV-2 by FDA under an Emergency Use Authorization (EUA).  This EUA will remain in effect (meaning this test can be used) for the duration of the COVID-19 declaration under Section 564(b)(1) of the Act, 21 U.S.C. section 360bbb-3(b)(1), unless the authorization is terminated or revoked sooner.  Performed at [facility], [address] [place], [place] [address]   Urine culture     Status: None   Collection Time: 08/14/19 10:27 AM   Specimen: Urine, Random  Result Value Ref Range Status   Specimen Description URINE, RANDOM  Final   Special Requests NONE  Final   Culture   Final    NO GROWTH Performed at [facility], [address] [place], [place] [address]    Report Status 08/15/2019 FINAL  Final  MRSA PCR Screening     Status:  None   Collection Time: 08/14/19 10:27 AM   Specimen: Nasopharyngeal  Result Value Ref Range Status   MRSA  by PCR NEGATIVE NEGATIVE Final    Comment:        The GeneXpert MRSA Assay (FDA approved for NASAL specimens only), is one component of a comprehensive MRSA colonization surveillance program. It is not intended to diagnose MRSA infection nor to guide or monitor treatment for MRSA infections. Performed at [facility], [address] [place], [place] [address]      Studies: DG Swallowing Func-Speech Pathology  Result Date: 08/20/2019 Objective Swallowing Evaluation: Type of Study: MBS-Modified Barium Swallow Study  Patient Details Name: Brenda [name] MRN: [id number] Date of Birth: 04-Dec-1940 Today's Date: 08/20/2019 Time: SLP Start Time (ACUTE ONLY): 1430 -SLP Stop Time (ACUTE ONLY): 1450 SLP Time Calculation (min) (ACUTE ONLY): 20 min Past Medical History: Past Medical History: Diagnosis Date . Anxiety  . Chronic diarrhea  . Coarse tremors  . Colon polyp  . DDD (degenerative disc disease), cervical  . Hiatal hernia  . History of skin cancer  . Hypercholesteremia  . Hypertension  . Migraines  . Osteoporosis  . Shortness of breath dyspnea   with exertion . Tremor  . Weight loss  Past Surgical History: Past Surgical History: Procedure Laterality Date . ESOPHAGEAL MANOMETRY N/A 01/15/2015  Procedure: ESOPHAGEAL MANOMETRY (EM);  Surgeon: Graylin Shiver, MD;  Location: [facility]COPY;  Service: Endoscopy;  Laterality: N/A; . HIATAL HERNIA REPAIR   . KYPHOPLASTY N/A 12/22/2017  Procedure: LUMBAR TWO KYPHOPLASTY;  Surgeon: Estill Bamberg, MD;  Location: [facility] OR;  Service: Orthopedics;  Laterality: N/A;  LUMBAR TWO KYPHOPLASTY . RHINOPLASTY   . SINUS SURGERY WITH INSTATRAK   HPI: Pt is a 79 yr old female with past medical history significant for hiatal hernia (s/p Nissan Fundoplication 03/2015), chronic diarrhea, IBS, paraesophageal hiatal hernia repair, GERD, essential tremor, colon polyps) who was admitted secondary to intractable nausea, vomiting, and diarrhea for the past 4 weeks which became particularly  severe in the past week. CXR 7/28: Cardiomegaly with mild vascular congestion and small to moderate bilateral pleural effusions. Bibasilar atelectasis. Pneumonia is not excluded. Unasyn started due to concern for aspiration pneumonia. CT abdomen: Moderate size hiatal hernia containing a portion of the stomach.  No data recorded Assessment / Plan / Recommendation CHL IP CLINICAL IMPRESSIONS 08/20/2019 Clinical Impression Pt was intermittently tearful throughout the study and required encouragement to participate in the evaluation. She stated that she "would do better" if her husband were present. Pt's unit and nurse were contacted; however, pt's husband was not in the room and she indicated that would continue without him.  The study was somewhat limited due to pt spitting out dysphagia 1 solids, refusing consecutive swallows and taking very small boluses of regular texture solids. She presented with oropharyngeal dysphagia characterized by piecemeal deglutition, reduced bolus cohesion with premature spillage, and a mild pharyngeal delay which together resulted in an episode of sensed aspiration (PAS 6) with the initial bolus of thin liquids via cup and subsequent expulsion with coughing. However, no subsequent instances of laryngeal invasion were noted with solids or liquids via straw and her oropharyngeal swallow mechanism was otherwise within functional limits. Screening of the esophageal phase of the pt's swallow revealed stasis of the 13mm barium tablet in the upper thoracic esophagus. The tablet was resistant to movement with additional intake of thin liquids or with nectar thick liquids, but was ultimately transported to the stomach following  intake of an additional bolus of dysphagia 3 solids.  SLP Visit Diagnosis Dysphagia, unspecified (R13.10) Attention and concentration deficit following -- Frontal lobe and executive function deficit following -- Impact on safety and function No limitations   CHL IP  TREATMENT RECOMMENDATION 08/20/2019 Treatment Recommendations Therapy as outlined in treatment plan below   No flowsheet data found. CHL IP DIET RECOMMENDATION 08/20/2019 SLP Diet Recommendations Dysphagia 3 (Mech soft) solids;Thin liquid Liquid Administration via Cup;No straw Medication Administration Whole meds with liquid Compensations Small sips/bites;Slow rate;Follow solids with liquid Postural Changes Seated upright at 90 degrees   CHL IP OTHER RECOMMENDATIONS 08/20/2019 Recommended Consults -- Oral Care Recommendations Oral care BID Other Recommendations --   CHL IP FOLLOW UP RECOMMENDATIONS 08/20/2019 Follow up Recommendations None   CHL IP FREQUENCY AND DURATION 08/20/2019 Speech Therapy Frequency (ACUTE ONLY) min 2x/week Treatment Duration 2 weeks      CHL IP ORAL PHASE 08/20/2019 Oral Phase Impaired Oral - Pudding Teaspoon -- Oral - Pudding Cup -- Oral - Honey Teaspoon -- Oral - Honey Cup -- Oral - Nectar Teaspoon -- Oral - Nectar Cup -- Oral - Nectar Straw Decreased bolus cohesion;Premature spillage;Piecemeal swallowing Oral - Thin Teaspoon -- Oral - Thin Cup Decreased bolus cohesion;Premature spillage;Piecemeal swallowing Oral - Thin Straw Decreased bolus cohesion;Premature spillage;Piecemeal swallowing Oral - Puree WFL Oral - Mech Soft WFL Oral - Regular WFL Oral - Multi-Consistency -- Oral - Pill -- Oral Phase - Comment --  CHL IP PHARYNGEAL PHASE 08/20/2019 Pharyngeal Phase Impaired Pharyngeal- Pudding Teaspoon -- Pharyngeal -- Pharyngeal- Pudding Cup -- Pharyngeal -- Pharyngeal- Honey Teaspoon -- Pharyngeal -- Pharyngeal- Honey Cup -- Pharyngeal -- Pharyngeal- Nectar Teaspoon -- Pharyngeal -- Pharyngeal- Nectar Cup Delayed swallow initiation-vallecula Pharyngeal -- Pharyngeal- Nectar Straw -- Pharyngeal -- Pharyngeal- Thin Teaspoon -- Pharyngeal -- Pharyngeal- Thin Cup Delayed swallow initiation-vallecula;Delayed swallow initiation-pyriform sinuses;Penetration/Aspiration before swallow Pharyngeal Material enters  airway, passes BELOW cords then ejected out Pharyngeal- Thin Straw Delayed swallow initiation-vallecula Pharyngeal -- Pharyngeal- Puree -- Pharyngeal -- Pharyngeal- Mechanical Soft WFL Pharyngeal -- Pharyngeal- Regular WFL Pharyngeal -- Pharyngeal- Multi-consistency -- Pharyngeal -- Pharyngeal- Pill -- Pharyngeal -- Pharyngeal Comment --  CHL IP CERVICAL ESOPHAGEAL PHASE 08/20/2019 Cervical Esophageal Phase WFL Pudding Teaspoon -- Pudding Cup -- Honey Teaspoon -- Honey Cup -- Nectar Teaspoon -- Nectar Cup -- Nectar Straw -- Thin Teaspoon -- Thin Cup -- Thin Straw -- Puree -- Mechanical Soft -- Regular -- Multi-consistency -- Pill -- Cervical Esophageal Comment -- Shanika I. Vear Clock, MS, CCC-SLP Acute Rehabilitation Services Office number [phone] Pager [phone] Scheryl Marten 08/20/2019, 5:45 PM              ECHOCARDIOGRAM COMPLETE  Result Date: 08/20/2019    ECHOCARDIOGRAM REPORT   Patient Name:   AILYNE PAWLEY Date of Exam: 08/20/2019 Medical Rec #:  [id number]     Height:       64.0 in Accession #:    [id number]    Weight:       112.0 lb Date of Birth:  January 06, 1941    BSA:          1.529 m Patient Age:    78 years      BP:           115/66 mmHg Patient Gender: F             HR:           94 bpm. Exam Location:  Inpatient Procedure: 2D Echo, Cardiac Doppler, Color Doppler  and Intracardiac            Opacification Agent Indications:    Dyspnea 786.09 / R06.00  History:        Patient has no prior history of Echocardiogram examinations.                 Signs/Symptoms:Shortness of Breath; Risk Factors:Hypertension,                 Dyslipidemia and Former Smoker. GERD.  Sonographer:    Renella Cunas RDCS Referring Phys: [id number] [facility] Clella Mckeel IMPRESSIONS  1. Left ventricular ejection fraction, by estimation, is 65 to 70%. The left ventricle has normal function. The left ventricle has no regional wall motion abnormalities. Left ventricular diastolic parameters are consistent with Grade I diastolic dysfunction  (impaired relaxation).  2. Right ventricular systolic function is normal. The right ventricular size is normal. There is normal pulmonary artery systolic pressure. The estimated right ventricular systolic pressure is 31.7 mmHg.  3. The mitral valve is degenerative. Mild to moderate mitral valve regurgitation. No evidence of mitral stenosis.  4. Tricuspid valve regurgitation is mild to moderate.  5. The aortic valve is abnormal. Aortic valve regurgitation is trivial. No aortic stenosis is present.  6. The inferior vena cava is normal in size with greater than 50% respiratory variability, suggesting right atrial pressure of 3 mmHg. FINDINGS  Left Ventricle: Left ventricular ejection fraction, by estimation, is 65 to 70%. The left ventricle has normal function. The left ventricle has no regional wall motion abnormalities. Definity contrast agent was given IV to delineate the left ventricular  endocardial borders. The left ventricular internal cavity size was normal in size. There is no left ventricular hypertrophy. Left ventricular diastolic parameters are consistent with Grade I diastolic dysfunction (impaired relaxation). Right Ventricle: The right ventricular size is normal. No increase in right ventricular wall thickness. Right ventricular systolic function is normal. There is normal pulmonary artery systolic pressure. The tricuspid regurgitant velocity is 2.68 m/s, and  with an assumed right atrial pressure of 3 mmHg, the estimated right ventricular systolic pressure is 31.7 mmHg. Left Atrium: Left atrial size was normal in size. Right Atrium: Right atrial size was normal in size. Pericardium: There is no evidence of pericardial effusion. Mitral Valve: The mitral valve is degenerative in appearance. Normal mobility of the mitral valve leaflets. Mild mitral annular calcification. Mild to moderate mitral valve regurgitation. No evidence of mitral valve stenosis. Tricuspid Valve: The tricuspid valve is normal in  structure. Tricuspid valve regurgitation is mild to moderate. No evidence of tricuspid stenosis. Aortic Valve: The aortic valve is abnormal. Aortic valve regurgitation is trivial. No aortic stenosis is present. There is moderate calcification of the aortic valve. Pulmonic Valve: The pulmonic valve was normal in structure. Pulmonic valve regurgitation is trivial. No evidence of pulmonic stenosis. Aorta: The aortic root is normal in size and structure. Venous: The inferior vena cava is normal in size with greater than 50% respiratory variability, suggesting right atrial pressure of 3 mmHg. IAS/Shunts: No atrial level shunt detected by color flow Doppler.  LEFT VENTRICLE PLAX 2D LVIDd:         3.94 cm  Diastology LVIDs:         3.14 cm  LV e' lateral:   4.46 cm/s LV PW:         0.86 cm  LV E/e' lateral: 13.5 LV IVS:        0.88 cm  LV e' medial:    3.59  cm/s LVOT diam:     1.90 cm  LV E/e' medial:  16.7 LV SV:         36 LV SV Index:   23 LVOT Area:     2.84 cm  RIGHT VENTRICLE RV S prime:     14.30 cm/s TAPSE (M-mode): 1.6 cm LEFT ATRIUM             Index       RIGHT ATRIUM           Index LA diam:        3.60 cm 2.35 cm/m  RA Area:     10.10 cm LA Vol (A2C):   29.4 ml 19.23 ml/m RA Volume:   20.50 ml  13.41 ml/m LA Vol (A4C):   26.1 ml 17.07 ml/m LA Biplane Vol: 28.2 ml 18.44 ml/m  AORTIC VALVE LVOT Vmax:   67.80 cm/s LVOT Vmean:  44.500 cm/s LVOT VTI:    0.126 m  AORTA Ao Root diam: 2.90 cm MITRAL VALVE               TRICUSPID VALVE MV Area (PHT): 4.41 cm    TR Peak grad:   28.7 mmHg MV Decel Time: 172 msec    TR Vmax:        268.00 cm/s MR Peak grad: 107.3 mmHg MR Vmax:      518.00 cm/s  SHUNTS MV E velocity: 60.00 cm/s  Systemic VTI:  0.13 m MV A velocity: 70.30 cm/s  Systemic Diam: 1.90 cm MV E/A ratio:  0.85 Weston Brass MD Electronically signed by Weston Brass MD Signature Date/Time: 08/20/2019/4:10:23 PM    Final      Joycelyn Das, MD  [facility] 08/21/2019

## 2019-08-21 NOTE — Progress Notes (Signed)
Physical Therapy Treatment Patient Details Name: Brenda [name] MRN: [id number] DOB: 06/06/1940 Today's Date: 08/21/2019    History of Present Illness Pt is a 79 y.o. female  with past medical history of GERD, status post Nissen fundoplication, paraesophageal hiatal hernia repair, acute kidney injury, enteritis, presented to hospital on 08/14/2019 with complaints of intractable nausea, vomiting and diarrhea for 4 weeks.  During hospitalization pt has been hypotensive. Admitted with volume depletion, AKI, hypotension, intractable N/V/D, hypokalemia, and hypomangesemia    PT Comments    Pt admitted with above diagnosis. Pt was able to ambulate with RW with min assist and cues with need for sitting rest break as she fatigues and HR incr with min activity. Will follow acutely.  Pt currently with functional limitations due to balance and endurance deficits. Pt will benefit from skilled PT to increase their independence and safety with mobility to allow discharge to the venue listed below.     Follow Up Recommendations  SNF     Equipment Recommendations  Wheelchair cushion (measurements PT);Wheelchair (measurements PT);3in1 (PT) (to be further assessed next venue)    Recommendations for Other Services       Precautions / Restrictions Precautions Precautions: Fall Restrictions Weight Bearing Restrictions: No    Mobility  Bed Mobility               General bed mobility comments: in chair on arrival  Transfers Overall transfer level: Needs assistance Equipment used: Rolling walker (2 wheeled) Transfers: Sit to/from [employer] Sit to Stand: Min assist         General transfer comment: Pt needed assit to power up.   Ambulation/Gait Ambulation/Gait assistance: Min assist Gait Distance (Feet): 160 Feet (80 feet x 2) Assistive device: Rolling walker (2 wheeled) Gait Pattern/deviations: Step-through pattern;Decreased stride length;Trunk flexed   Gait velocity  interpretation: <1.31 ft/sec, indicative of household ambulator General Gait Details: Pt ambulated 80 feet with chair follow. Had to sit and rest and then walked back to room.  Fatigues quickly.  HR 104 - 132 bpm with activity.    Stairs             Wheelchair Mobility    Modified Rankin (Stroke Patients Only)       Balance Overall balance assessment: Needs assistance Sitting-balance support: No upper extremity supported Sitting balance-Leahy Scale: Fair Sitting balance - Comments: able to maintain static sitting with legs crossed   Standing balance support: Bilateral upper extremity supported Standing balance-Leahy Scale: Poor Standing balance comment: <10 secs for stand pivot with RW                            Cognition Arousal/Alertness: Awake/alert Behavior During Therapy: WFL for tasks assessed/performed Overall Cognitive Status: Impaired/Different from baseline Area of Impairment: Orientation;Problem solving                 Orientation Level: Disoriented to;Time     Following Commands: Follows multi-step commands inconsistently;Follows one step commands with increased time     Problem Solving: Slow processing;Decreased initiation;Difficulty sequencing;Requires verbal cues;Requires tactile cues        Exercises General Exercises - Lower Extremity Long Arc Quad: AROM;Both;5 reps;Seated    General Comments General comments (skin integrity, edema, etc.): BP stable      Pertinent Vitals/Pain Pain Assessment: No/denies pain    Home Living  Prior Function            PT Goals (current goals can now be found in the care plan section) Progress towards PT goals: Progressing toward goals    Frequency    Min 2X/week      PT Plan Current plan remains appropriate    Co-evaluation              AM-PAC PT "6 Clicks" Mobility   Outcome Measure  Help needed turning from your back to your side while  in a flat bed without using bedrails?: A Little Help needed moving from lying on your back to sitting on the side of a flat bed without using bedrails?: A Little Help needed moving to and from a bed to a chair (including a wheelchair)?: A Little Help needed standing up from a chair using your arms (e.g., wheelchair or bedside chair)?: A Little Help needed to walk in hospital room?: A Little Help needed climbing 3-5 steps with a railing? : Total 6 Click Score: 16    End of Session Equipment Utilized During Treatment: Gait belt Activity Tolerance: Patient limited by fatigue Patient left: with call bell/phone within reach;with family/visitor present;in chair Nurse Communication: Mobility status PT Visit Diagnosis: Unsteadiness on feet (R26.81);Other abnormalities of gait and mobility (R26.89);Muscle weakness (generalized) (M62.81)     Time: 4854-6270 PT Time Calculation (min) (ACUTE ONLY): 18 min  Charges:  $Gait Training: 8-22 mins                     Azalya Galyon W,PT Acute Rehabilitation Services Pager:  [phone]  Office:  [phone]     Berline Lopes 08/21/2019, 1:11 PM

## 2019-08-22 ENCOUNTER — Other Ambulatory Visit: Payer: Medicare PPO

## 2019-08-22 LAB — GASTROINTESTINAL PANEL BY PCR, STOOL (REPLACES STOOL CULTURE)

## 2019-08-22 LAB — BASIC METABOLIC PANEL
Anion gap: 10 (ref 5–15)
BUN: 5 mg/dL — ABNORMAL LOW (ref 8–23)
CO2: 22 mmol/L (ref 22–32)
Calcium: 7.6 mg/dL — ABNORMAL LOW (ref 8.9–10.3)
Chloride: 103 mmol/L (ref 98–111)
Creatinine, Ser: 0.87 mg/dL (ref 0.44–1.00)
GFR calc Af Amer: >60 mL/min (ref >60)
GFR calc non Af Amer: >60 mL/min (ref >60)
Glucose, Bld: 147 mg/dL — ABNORMAL HIGH (ref 70–99)
Potassium: 3.4 mmol/L — ABNORMAL LOW (ref 3.5–5.1)
Sodium: 135 mmol/L (ref 135–145)

## 2019-08-22 LAB — CBC
HCT: 33.6 % — ABNORMAL LOW (ref 36.0–46.0)
Hemoglobin: 11 g/dL — ABNORMAL LOW (ref 12.0–15.0)
MCH: 32.5 pg (ref 26.0–34.0)
MCHC: 32.7 g/dL (ref 30.0–36.0)
MCV: 99.4 fL (ref 80.0–100.0)
Platelets: 183 10*3/uL (ref 150–400)
RBC: 3.38 MIL/uL — ABNORMAL LOW (ref 3.87–5.11)
RDW: 12.4 % (ref 11.5–15.5)
WBC: 4.1 10*3/uL (ref 4.0–10.5)
nRBC: 0 % (ref 0.0–0.2)

## 2019-08-22 LAB — SARS CORONAVIRUS 2 BY RT PCR (HOSPITAL ORDER, PERFORMED IN ~~LOC~~ HOSPITAL LAB): SARS Coronavirus 2: NEGATIVE

## 2019-08-22 NOTE — TOC Progression Note (Signed)
Transition of Care [facility]) - Progression Note    Patient Details  Name: Brenda [name] MRN: [id number] Date of Birth: 1940/10/10  [facility] CM/SW Contact  Eduard Roux, [place] Phone Number: 08/22/2019, 12:11 PM  Clinical Narrative:     CSW contacted [employer]-  Informed patient still in hospital- new SNF auth dates 08/05-08/09 Reference # [id number] for [facility].  RN updated -covid test needed  Antony Blackbird, MSW, LCSWA Clinical Social Worker    Expected Discharge Plan: Skilled Nursing Facility Barriers to Discharge: English as a second language teacher, Continued Medical Work up  [facility] Expected Discharge Plan: Skilled Nursing Facility       Living arrangements for the past 2 months: Single Family Home                                       Social Determinants of Health (SDOH) Interventions    Readmission Risk Interventions No flowsheet data found.

## 2019-08-22 NOTE — Progress Notes (Signed)
PROGRESS NOTE    Brenda [name]  AOZ:[id number] DOB: 13-Aug-1940 DOA: 08/13/2019 PCP: Clovis Riley, L.August Saucer, MD   Brief Narrative: Brenda [name] is a 79 y.o. female with past medical history of GERD, status post Nissen fundoplication, paraesophageal hiatal hernia repair, acute kidney injury, enteritis with resultant hypovolemia and hypotension requiring vasopressors.   Assessment & Plan:   Principal Problem:   Volume depletion Active Problems:   Paraesophageal hiatal hernia s/p repair w biologic mesh 03/26/2015   AKI (acute kidney injury) (HCC)   Hiatal hernia with obstruction but no gangrene   Nausea vomiting and diarrhea   Hypotension   Enteritis   Volume depletion, gastrointestinal loss   Protein-calorie malnutrition, severe   Hypotension Patient with hypovolemia leading to hypotension related to intractable nausea/vomiting/diarrhea. Patient required ICU admission and vasopressor support to sustain adequate blood pressure control. Patient was in the ICU from 7/26 - 7/28. Hypotension is resolved.  Possible pneumonia Patient with concern for pneumonia with associated cough. Unclear of diagnosis, however, patient was treated for five days on Unasyn and has completed course.  Cryptosporidium infection Enteritis  Patient is not immunocompromised. Likely self-limiting. Supportive care and now symptoms have improved.  Hypokalemia Hypomagnesemia Secondary to vomiting/diarrhea. Supplementation given. Continue as needed.  Hypertension Suffered hypotension during this admission. Patientis on lisinopril and Verapamil as an outpatient.  -Continue Verapamil  Hyperlipidemia -Continue Lipitor  Anxiety -Continue Xanax prn   DVT prophylaxis: Heparin subq Code Status:   Code Status: Full Code Family Communication: None at bedside Disposition Plan: Discharge to SNF likely in 24 hours pending bed availability. Medically stable for discharge. COVID-19 testing pending   Consultants:    PCCM  GI  Procedures:   None  Antimicrobials:  Unasyn    Subjective: No issues overnight. Has been enjoying the olympics  Objective: Vitals:   08/21/19 1500 08/21/19 1930 08/22/19 0428 08/22/19 0815  BP: (!) 91/56 (!) 97/55 (!) 90/42 104/65  Pulse: 85 96 68 86  Resp: Temp: 98.5 F (36.9 C) 98.7 F (37.1 C) 98.4 F (36.9 C) (!) 97.4 F (36.3 C)  TempSrc: Oral Oral Oral Oral  SpO2: 95% 91% 96% 96%  Weight:   46.6 kg   Height:        Intake/Output Summary (Last 24 hours) at 08/22/2019 0901 Last data filed at 08/22/2019 0835 Gross per 24 hour  Intake 120 ml  Output 202 ml  Net -82 ml   Filed Weights   08/20/19 0346 08/21/19 0424 08/22/19 0428  Weight: 50.8 kg 47.3 kg 46.6 kg    Examination:  General exam: Appears calm and comfortable Respiratory system: Clear to auscultation. Respiratory effort normal. Cardiovascular system: S1 & S2 heard, RRR. No murmurs, rubs, gallops or clicks. Gastrointestinal system: Abdomen is nondistended, soft and nontender. No organomegaly or masses felt. Normal bowel sounds heard. Central nervous system: Alert and oriented. No focal neurological deficits. Musculoskeletal: No edema. No calf tenderness Skin: No cyanosis. No rashes Psychiatry: Judgement and insight appear normal. Mood & affect appropriate.     Data Reviewed: I have personally reviewed following labs and imaging studies  CBC Lab Results  Component Value Date   WBC 4.1 08/22/2019   RBC 3.38 (L) 08/22/2019   HGB 11.0 (L) 08/22/2019   HCT 33.6 (L) 08/22/2019   MCV 99.4 08/22/2019   MCH 32.5 08/22/2019   PLT 183 08/22/2019   MCHC 32.7 08/22/2019   RDW 12.4 08/22/2019   LYMPHSABS 0.9 08/13/2019   MONOABS 1.0  08/13/2019   EOSABS 0.0 08/13/2019   BASOSABS 0.1 08/13/2019     Last metabolic panel Lab Results  Component Value Date   NA 135 08/22/2019   K 3.4 (L) 08/22/2019   CL 103 08/22/2019   CO2 22 08/22/2019   BUN 5 (L) 08/22/2019    CREATININE 0.87 08/22/2019   GLUCOSE 147 (H) 08/22/2019   GFRNONAA >60 08/22/2019   GFRAA >60 08/22/2019   CALCIUM 7.6 (L) 08/22/2019   PHOS 2.5 08/19/2019   PROT 5.1 (L) 08/19/2019   ALBUMIN 2.1 (L) 08/19/2019   BILITOT 0.6 08/19/2019   ALKPHOS 72 08/19/2019   AST 23 08/19/2019   ALT 19 08/19/2019   ANIONGAP 10 08/22/2019    CBG (last 3)  No results for input(s): GLUCAP in the last 72 hours.   GFR: Estimated Creatinine Clearance: 39.8 mL/min (by C-G formula based on SCr of 0.87 mg/dL).  Coagulation Profile: No results for input(s): INR, PROTIME in the last 168 hours.  Recent Results (from the past 240 hour(s))  Blood culture (routine x 2)     Status: None   Collection Time: 08/13/19  2:54 PM   Specimen: BLOOD RIGHT WRIST  Result Value Ref Range Status   Specimen Description BLOOD RIGHT WRIST  Final   Special Requests   Final    BOTTLES DRAWN AEROBIC AND ANAEROBIC Blood Culture adequate volume   Culture   Final    NO GROWTH 5 DAYS Performed at [facility], [address], [place], [place] [address]    Report Status 08/18/2019 FINAL  Final  Blood culture (routine x 2)     Status: None   Collection Time: 08/13/19  3:20 PM   Specimen: BLOOD RIGHT HAND  Result Value Ref Range Status   Specimen Description BLOOD RIGHT HAND  Final   Special Requests   Final    BOTTLES DRAWN AEROBIC AND ANAEROBIC Blood Culture results may not be optimal due to an inadequate volume of blood received in culture bottles   Culture   Final    NO GROWTH 5 DAYS Performed at [facility], [address] [place], [place] [address]    Report Status 08/18/2019 FINAL  Final  SARS Coronavirus 2 by RT PCR (hospital order, performed in [facility] hospital lab) Nasopharyngeal Nasopharyngeal Swab     Status: None   Collection Time: 08/13/19  7:44 PM   Specimen: Nasopharyngeal Swab  Result Value Ref Range Status   SARS Coronavirus 2 NEGATIVE NEGATIVE Final    Comment: (NOTE) SARS-CoV-2  target nucleic acids are NOT DETECTED.  The SARS-CoV-2 RNA is generally detectable in upper and lower respiratory specimens during the acute phase of infection. The lowest concentration of SARS-CoV-2 viral copies this assay can detect is 250 copies / mL. A negative result does not preclude SARS-CoV-2 infection and should not be used as the sole basis for treatment or other patient management decisions.  A negative result may occur with improper specimen collection / handling, submission of specimen other than nasopharyngeal swab, presence of viral mutation(s) within the areas targeted by this assay, and inadequate number of viral copies (<250 copies / mL). A negative result must be combined with clinical observations, patient history, and epidemiological information.  Fact Sheet for Patients:   BoilerBrush.com.cy  Fact Sheet for Healthcare Providers: https://pope.com/  This test is not yet approved or  cleared by the [place] FDA and has been authorized for detection and/or diagnosis of SARS-CoV-2 by FDA  under an Emergency Use Authorization (EUA).  This EUA will remain in effect (meaning this test can be used) for the duration of the COVID-19 declaration under Section 564(b)(1) of the Act, 21 U.S.C. section 360bbb-3(b)(1), unless the authorization is terminated or revoked sooner.  Performed at [facility], [address] [place], [place] [address]   Urine culture     Status: None   Collection Time: 08/14/19 10:27 AM   Specimen: Urine, Random  Result Value Ref Range Status   Specimen Description URINE, RANDOM  Final   Special Requests NONE  Final   Culture   Final    NO GROWTH Performed at [facility], [address], [place], [place] [address]    Report Status 08/15/2019 FINAL  Final  MRSA PCR Screening     Status: None   Collection Time: 08/14/19 10:27 AM   Specimen: Nasopharyngeal  Result Value Ref Range  Status   MRSA by PCR NEGATIVE NEGATIVE Final    Comment:        The GeneXpert MRSA Assay (FDA approved for NASAL specimens only), is one component of a comprehensive MRSA colonization surveillance program. It is not intended to diagnose MRSA infection nor to guide or monitor treatment for MRSA infections. Performed at [facility], [address], [place], [place] [address]   Gastrointestinal Panel by PCR , Stool     Status: Abnormal   Collection Time: 08/20/19 11:28 AM   Specimen: Stool  Result Value Ref Range Status   Campylobacter species NOT DETECTED NOT DETECTED Final   Plesimonas shigelloides NOT DETECTED NOT DETECTED Final   Salmonella species NOT DETECTED NOT DETECTED Final   Yersinia enterocolitica NOT DETECTED NOT DETECTED Final   Vibrio species NOT DETECTED NOT DETECTED Final   Vibrio cholerae NOT DETECTED NOT DETECTED Final   Enteroaggregative E coli (EAEC) NOT DETECTED NOT DETECTED Final   Enteropathogenic E coli (EPEC) NOT DETECTED NOT DETECTED Final   Enterotoxigenic E coli (ETEC) NOT DETECTED NOT DETECTED Final   Shiga like toxin producing E coli (STEC) NOT DETECTED NOT DETECTED Final   Shigella/Enteroinvasive E coli (EIEC) NOT DETECTED NOT DETECTED Final   Cryptosporidium DETECTED (A) NOT DETECTED Final   Cyclospora cayetanensis NOT DETECTED NOT DETECTED Final   Entamoeba histolytica NOT DETECTED NOT DETECTED Final   Giardia lamblia NOT DETECTED NOT DETECTED Final   Adenovirus F40/41 NOT DETECTED NOT DETECTED Final   Astrovirus NOT DETECTED NOT DETECTED Final   Norovirus GI/GII NOT DETECTED NOT DETECTED Final   Rotavirus A NOT DETECTED NOT DETECTED Final   Sapovirus (I, II, IV, and V) NOT DETECTED NOT DETECTED Final    Comment: Performed at [facility], [address] [place], [place] [address]        Radiology Studies: DG Swallowing Func-Speech Pathology  Result Date: 08/20/2019 Objective Swallowing Evaluation: Type of Study:  MBS-Modified Barium Swallow Study  Patient Details Name: Brenda [name] MRN: [id number] Date of Birth: Jun 01, 1940 Today's Date: 08/20/2019 Time: SLP Start Time (ACUTE ONLY): 1430 -SLP Stop Time (ACUTE ONLY): 1450 SLP Time Calculation (min) (ACUTE ONLY): 20 min Past Medical History: Past Medical History: Diagnosis Date . Anxiety  . Chronic diarrhea  . Coarse tremors  . Colon polyp  . DDD (degenerative disc disease), cervical  . Hiatal hernia  . History of skin cancer  . Hypercholesteremia  . Hypertension  . Migraines  . Osteoporosis  . Shortness of breath dyspnea   with exertion .  Tremor  . Weight loss  Past Surgical History: Past Surgical History: Procedure Laterality Date . ESOPHAGEAL MANOMETRY N/A 01/15/2015  Procedure: ESOPHAGEAL MANOMETRY (EM);  Surgeon: Graylin Shiver, MD;  Location: [facility] ENDOSCOPY;  Service: Endoscopy;  Laterality: N/A; . HIATAL HERNIA REPAIR   . KYPHOPLASTY N/A 12/22/2017  Procedure: LUMBAR TWO KYPHOPLASTY;  Surgeon: Estill Bamberg, MD;  Location: [facility] OR;  Service: Orthopedics;  Laterality: N/A;  LUMBAR TWO KYPHOPLASTY . RHINOPLASTY   . SINUS SURGERY WITH INSTATRAK   HPI: Pt is a 79 yr old female with past medical history significant for hiatal hernia (s/p Nissan Fundoplication 03/2015), chronic diarrhea, IBS, paraesophageal hiatal hernia repair, GERD, essential tremor, colon polyps) who was admitted secondary to intractable nausea, vomiting, and diarrhea for the past 4 weeks which became particularly severe in the past week. CXR 7/28: Cardiomegaly with mild vascular congestion and small to moderate bilateral pleural effusions. Bibasilar atelectasis. Pneumonia is not excluded. Unasyn started due to concern for aspiration pneumonia. CT abdomen: Moderate size hiatal hernia containing a portion of the stomach.  No data recorded Assessment / Plan / Recommendation CHL IP CLINICAL IMPRESSIONS 08/20/2019 Clinical Impression Pt was intermittently tearful throughout the study and required encouragement to  participate in the evaluation. She stated that she "would do better" if her husband were present. Pt's unit and nurse were contacted; however, pt's husband was not in the room and she indicated that would continue without him.  The study was somewhat limited due to pt spitting out dysphagia 1 solids, refusing consecutive swallows and taking very small boluses of regular texture solids. She presented with oropharyngeal dysphagia characterized by piecemeal deglutition, reduced bolus cohesion with premature spillage, and a mild pharyngeal delay which together resulted in an episode of sensed aspiration (PAS 6) with the initial bolus of thin liquids via cup and subsequent expulsion with coughing. However, no subsequent instances of laryngeal invasion were noted with solids or liquids via straw and her oropharyngeal swallow mechanism was otherwise within functional limits. Screening of the esophageal phase of the pt's swallow revealed stasis of the 63mm barium tablet in the upper thoracic esophagus. The tablet was resistant to movement with additional intake of thin liquids or with nectar thick liquids, but was ultimately transported to the stomach following intake of an additional bolus of dysphagia 3 solids.  SLP Visit Diagnosis Dysphagia, unspecified (R13.10) Attention and concentration deficit following -- Frontal lobe and executive function deficit following -- Impact on safety and function No limitations   CHL IP TREATMENT RECOMMENDATION 08/20/2019 Treatment Recommendations Therapy as outlined in treatment plan below   No flowsheet data found. CHL IP DIET RECOMMENDATION 08/20/2019 SLP Diet Recommendations Dysphagia 3 (Mech soft) solids;Thin liquid Liquid Administration via Cup;No straw Medication Administration Whole meds with liquid Compensations Small sips/bites;Slow rate;Follow solids with liquid Postural Changes Seated upright at 90 degrees   CHL IP OTHER RECOMMENDATIONS 08/20/2019 Recommended Consults -- Oral Care  Recommendations Oral care BID Other Recommendations --   CHL IP FOLLOW UP RECOMMENDATIONS 08/20/2019 Follow up Recommendations None   CHL IP FREQUENCY AND DURATION 08/20/2019 Speech Therapy Frequency (ACUTE ONLY) min 2x/week Treatment Duration 2 weeks      CHL IP ORAL PHASE 08/20/2019 Oral Phase Impaired Oral - Pudding Teaspoon -- Oral - Pudding Cup -- Oral - Honey Teaspoon -- Oral - Honey Cup -- Oral - Nectar Teaspoon -- Oral - Nectar Cup -- Oral - Nectar Straw Decreased bolus cohesion;Premature spillage;Piecemeal swallowing Oral - Thin Teaspoon -- Oral - Thin Cup Decreased  bolus cohesion;Premature spillage;Piecemeal swallowing Oral - Thin Straw Decreased bolus cohesion;Premature spillage;Piecemeal swallowing Oral - Puree WFL Oral - Mech Soft WFL Oral - Regular WFL Oral - Multi-Consistency -- Oral - Pill -- Oral Phase - Comment --  CHL IP PHARYNGEAL PHASE 08/20/2019 Pharyngeal Phase Impaired Pharyngeal- Pudding Teaspoon -- Pharyngeal -- Pharyngeal- Pudding Cup -- Pharyngeal -- Pharyngeal- Honey Teaspoon -- Pharyngeal -- Pharyngeal- Honey Cup -- Pharyngeal -- Pharyngeal- Nectar Teaspoon -- Pharyngeal -- Pharyngeal- Nectar Cup Delayed swallow initiation-vallecula Pharyngeal -- Pharyngeal- Nectar Straw -- Pharyngeal -- Pharyngeal- Thin Teaspoon -- Pharyngeal -- Pharyngeal- Thin Cup Delayed swallow initiation-vallecula;Delayed swallow initiation-pyriform sinuses;Penetration/Aspiration before swallow Pharyngeal Material enters airway, passes BELOW cords then ejected out Pharyngeal- Thin Straw Delayed swallow initiation-vallecula Pharyngeal -- Pharyngeal- Puree -- Pharyngeal -- Pharyngeal- Mechanical Soft WFL Pharyngeal -- Pharyngeal- Regular WFL Pharyngeal -- Pharyngeal- Multi-consistency -- Pharyngeal -- Pharyngeal- Pill -- Pharyngeal -- Pharyngeal Comment --  CHL IP CERVICAL ESOPHAGEAL PHASE 08/20/2019 Cervical Esophageal Phase WFL Pudding Teaspoon -- Pudding Cup -- Honey Teaspoon -- Honey Cup -- Nectar Teaspoon -- Nectar Cup  -- Nectar Straw -- Thin Teaspoon -- Thin Cup -- Thin Straw -- Puree -- Mechanical Soft -- Regular -- Multi-consistency -- Pill -- Cervical Esophageal Comment -- Shanika I. Vear ClockPhillips, MS, CCC-SLP Acute Rehabilitation Services Office number [phone] Pager [phone] Scheryl MartenShanika I Phillips 08/20/2019, 5:45 PM              ECHOCARDIOGRAM COMPLETE  Result Date: 08/20/2019    ECHOCARDIOGRAM REPORT   Patient Name:   Brenda [name] Date of Exam: 08/20/2019 Medical Rec #:  [id number]     Height:       64.0 in Accession #:    [id number]    Weight:       112.0 lb Date of Birth:  [birth year]    BSA:          1.529 m Patient Age:    78 years      BP:           115/66 mmHg Patient Gender: F             HR:           94 bpm. Exam Location:  Inpatient Procedure: 2D Echo, Cardiac Doppler, Color Doppler and Intracardiac            Opacification Agent Indications:    Dyspnea 786.09 / R06.00  History:        Patient has no prior history of Echocardiogram examinations.                 Signs/Symptoms:Shortness of Breath; Risk Factors:Hypertension,                 Dyslipidemia and Former Smoker. GERD.  Sonographer:    Renella CunasJulia Swaim RDCS Referring Phys: [id number] [facility]MAN POKHREL IMPRESSIONS  1. Left ventricular ejection fraction, by estimation, is 65 to 70%. The left ventricle has normal function. The left ventricle has no regional wall motion abnormalities. Left ventricular diastolic parameters are consistent with Grade I diastolic dysfunction (impaired relaxation).  2. Right ventricular systolic function is normal. The right ventricular size is normal. There is normal pulmonary artery systolic pressure. The estimated right ventricular systolic pressure is 31.7 mmHg.  3. The mitral valve is degenerative. Mild to moderate mitral valve regurgitation. No evidence of mitral stenosis.  4. Tricuspid valve regurgitation is mild to moderate.  5. The aortic valve is abnormal. Aortic valve regurgitation is trivial. No aortic stenosis is present.  6. The  inferior vena cava is normal in size with greater than 50% respiratory variability, suggesting right atrial pressure of 3 mmHg. FINDINGS  Left Ventricle: Left ventricular ejection fraction, by estimation, is 65 to 70%. The left ventricle has normal function. The left ventricle has no regional wall motion abnormalities. Definity contrast agent was given IV to delineate the left ventricular  endocardial borders. The left ventricular internal cavity size was normal in size. There is no left ventricular hypertrophy. Left ventricular diastolic parameters are consistent with Grade I diastolic dysfunction (impaired relaxation). Right Ventricle: The right ventricular size is normal. No increase in right ventricular wall thickness. Right ventricular systolic function is normal. There is normal pulmonary artery systolic pressure. The tricuspid regurgitant velocity is 2.68 m/s, and  with an assumed right atrial pressure of 3 mmHg, the estimated right ventricular systolic pressure is 31.7 mmHg. Left Atrium: Left atrial size was normal in size. Right Atrium: Right atrial size was normal in size. Pericardium: There is no evidence of pericardial effusion. Mitral Valve: The mitral valve is degenerative in appearance. Normal mobility of the mitral valve leaflets. Mild mitral annular calcification. Mild to moderate mitral valve regurgitation. No evidence of mitral valve stenosis. Tricuspid Valve: The tricuspid valve is normal in structure. Tricuspid valve regurgitation is mild to moderate. No evidence of tricuspid stenosis. Aortic Valve: The aortic valve is abnormal. Aortic valve regurgitation is trivial. No aortic stenosis is present. There is moderate calcification of the aortic valve. Pulmonic Valve: The pulmonic valve was normal in structure. Pulmonic valve regurgitation is trivial. No evidence of pulmonic stenosis. Aorta: The aortic root is normal in size and structure. Venous: The inferior vena cava is normal in size with  greater than 50% respiratory variability, suggesting right atrial pressure of 3 mmHg. IAS/Shunts: No atrial level shunt detected by color flow Doppler.  LEFT VENTRICLE PLAX 2D LVIDd:         3.94 cm  Diastology LVIDs:         3.14 cm  LV e' lateral:   4.46 cm/s LV PW:         0.86 cm  LV E/e' lateral: 13.5 LV IVS:        0.88 cm  LV e' medial:    3.59 cm/s LVOT diam:     1.90 cm  LV E/e' medial:  16.7 LV SV:         36 LV SV Index:   23 LVOT Area:     2.84 cm  RIGHT VENTRICLE RV S prime:     14.30 cm/s TAPSE (M-mode): 1.6 cm LEFT ATRIUM             Index       RIGHT ATRIUM           Index LA diam:        3.60 cm 2.35 cm/m  RA Area:     10.10 cm LA Vol (A2C):   29.4 ml 19.23 ml/m RA Volume:   20.50 ml  13.41 ml/m LA Vol (A4C):   26.1 ml 17.07 ml/m LA Biplane Vol: 28.2 ml 18.44 ml/m  AORTIC VALVE LVOT Vmax:   67.80 cm/s LVOT Vmean:  44.500 cm/s LVOT VTI:    0.126 m  AORTA Ao Root diam: 2.90 cm MITRAL VALVE               TRICUSPID VALVE MV Area (PHT): 4.41 cm    TR Peak grad:   28.7 mmHg MV Decel Time: 172  msec    TR Vmax:        268.00 cm/s MR Peak grad: 107.3 mmHg MR Vmax:      518.00 cm/s  SHUNTS MV E velocity: 60.00 cm/s  Systemic VTI:  0.13 m MV A velocity: 70.30 cm/s  Systemic Diam: 1.90 cm MV E/A ratio:  0.85 Weston Brass MD Electronically signed by Weston Brass MD Signature Date/Time: 08/20/2019/4:10:23 PM    Final         Scheduled Meds: . (feeding supplement) PROSource Plus  30 mL Oral TID BM  . budesonide  9 mg Oral QAC breakfast  . Chlorhexidine Gluconate Cloth  6 each Topical Daily  . cholestyramine  1 packet Oral Daily  . Eluxadoline  100 mg Oral BID  . famotidine  40 mg Oral Daily  . feeding supplement  1 Container Oral TID BM  . furosemide  40 mg Intravenous Daily  . heparin injection (subcutaneous)  5,000 Units Subcutaneous Q8H  . magnesium oxide  400 mg Oral BID  . multivitamin with minerals  1 tablet Oral Daily  . potassium chloride  20 mEq Oral Daily  . primidone  100  mg Oral TID  . sodium chloride flush  10-40 mL Intracatheter Q12H  . verapamil  240 mg Oral Daily   Continuous Infusions: . sodium chloride 250 mL (08/14/19 1747)     LOS: 9 days     Jacquelin Hawking, MD [facility] 08/22/2019, 9:01 AM  If 7PM-7AM, please contact night-coverage [email or web address]

## 2019-08-22 NOTE — Progress Notes (Signed)
  Speech Language Pathology Treatment: Dysphagia  Patient Details Name: Brenda [name] MRN: [id number] DOB: 1940-04-29 Today's Date: 08/22/2019 Time: 4417-1278 SLP Time Calculation (min) (ACUTE ONLY): 13 min  Assessment / Plan / Recommendation Clinical Impression  Pt was seen for dysphagia treatment with her husband present. Pt, nursing, and her husband reported that the pt has been tolerating the current diet without overt s/sx of aspiration. Pt indicated that she would love to have a burger and to have the [place] which her husband brought. Pt tolerated regular texture solids and thin liquids via straw using consecutive swallows without symptoms of oropharyngeal dysphagia. It is recommended that her diet be advanced to regular texture solids. Further skilled SLP services are not clinically indicated at this time.    HPI HPI: Pt is a 79 yr old female with past medical history significant for hiatal hernia (s/p [employer] Fundoplication 07/1834), chronic diarrhea, IBS, paraesophageal hiatal hernia repair, GERD, essential tremor, colon polyps) who was admitted secondary to intractable nausea, vomiting, and diarrhea for the past 4 weeks which became particularly severe in the past week. CXR 7/28: Cardiomegaly with mild vascular congestion and small to moderate bilateral pleural effusions. Bibasilar atelectasis. Pneumonia is not excluded. Unasyn started due to concern for aspiration pneumonia. CT abdomen: Moderate size hiatal hernia containing a portion of the stomach.      SLP Plan  All goals met;Discharge SLP treatment due to (comment)       Recommendations  Diet recommendations: Regular;Thin liquid Liquids provided via: Cup;Straw Medication Administration: Whole meds with liquid Supervision: Staff to assist with self feeding Compensations: Slow rate;Follow solids with liquid Postural Changes and/or Swallow Maneuvers: Seated upright 90 degrees                Oral Care Recommendations: Oral  care BID Follow up Recommendations: Other (comment) (TBD) SLP Visit Diagnosis: Dysphagia, unspecified (R13.10) Plan: All goals met;Discharge SLP treatment due to (comment)       Mordche Hedglin I. Hardin Negus, [place], [place] Office number [phone] Pager [phone]                Horton Marshall 08/22/2019, 5:14 PM        .

## 2019-08-23 DIAGNOSIS — I10 Essential (primary) hypertension: Secondary | ICD-10-CM | POA: Diagnosis not present

## 2019-08-23 DIAGNOSIS — G4700 Insomnia, unspecified: Secondary | ICD-10-CM | POA: Diagnosis not present

## 2019-08-23 DIAGNOSIS — E861 Hypovolemia: Secondary | ICD-10-CM | POA: Diagnosis not present

## 2019-08-23 DIAGNOSIS — M6281 Muscle weakness (generalized): Secondary | ICD-10-CM | POA: Diagnosis not present

## 2019-08-23 DIAGNOSIS — K529 Noninfective gastroenteritis and colitis, unspecified: Secondary | ICD-10-CM | POA: Diagnosis not present

## 2019-08-23 DIAGNOSIS — K440 Diaphragmatic hernia with obstruction, without gangrene: Secondary | ICD-10-CM | POA: Diagnosis not present

## 2019-08-23 DIAGNOSIS — G250 Essential tremor: Secondary | ICD-10-CM | POA: Diagnosis not present

## 2019-08-23 DIAGNOSIS — Z7401 Bed confinement status: Secondary | ICD-10-CM | POA: Diagnosis not present

## 2019-08-23 DIAGNOSIS — F411 Generalized anxiety disorder: Secondary | ICD-10-CM | POA: Diagnosis not present

## 2019-08-23 DIAGNOSIS — N179 Acute kidney failure, unspecified: Secondary | ICD-10-CM | POA: Diagnosis not present

## 2019-08-23 DIAGNOSIS — E43 Unspecified severe protein-calorie malnutrition: Secondary | ICD-10-CM | POA: Diagnosis not present

## 2019-08-23 DIAGNOSIS — M25562 Pain in left knee: Secondary | ICD-10-CM | POA: Diagnosis not present

## 2019-08-23 DIAGNOSIS — E869 Volume depletion, unspecified: Secondary | ICD-10-CM | POA: Diagnosis not present

## 2019-08-23 DIAGNOSIS — I9589 Other hypotension: Secondary | ICD-10-CM | POA: Diagnosis not present

## 2019-08-23 DIAGNOSIS — K219 Gastro-esophageal reflux disease without esophagitis: Secondary | ICD-10-CM | POA: Diagnosis not present

## 2019-08-23 DIAGNOSIS — R262 Difficulty in walking, not elsewhere classified: Secondary | ICD-10-CM | POA: Diagnosis not present

## 2019-08-23 DIAGNOSIS — I959 Hypotension, unspecified: Secondary | ICD-10-CM | POA: Diagnosis not present

## 2019-08-23 DIAGNOSIS — R251 Tremor, unspecified: Secondary | ICD-10-CM | POA: Diagnosis not present

## 2019-08-23 DIAGNOSIS — R5381 Other malaise: Secondary | ICD-10-CM | POA: Diagnosis not present

## 2019-08-23 DIAGNOSIS — E785 Hyperlipidemia, unspecified: Secondary | ICD-10-CM | POA: Diagnosis not present

## 2019-08-23 DIAGNOSIS — M2550 Pain in unspecified joint: Secondary | ICD-10-CM | POA: Diagnosis not present

## 2019-08-23 DIAGNOSIS — K580 Irritable bowel syndrome with diarrhea: Secondary | ICD-10-CM | POA: Diagnosis not present

## 2019-08-23 MED ORDER — PROSOURCE PLUS PO LIQD: 30.0000 mL | Freq: Three times a day (TID) | ORAL | Status: DC

## 2019-08-23 MED ORDER — ALPRAZOLAM 0.25 MG PO TABS: 0.2500 mg | ORAL_TABLET | Freq: Every day | ORAL | 0 refills | Status: DC | PRN

## 2019-08-23 MED ORDER — VERAPAMIL HCL ER 120 MG PO TBCR
120.0000 mg | EXTENDED_RELEASE_TABLET | Freq: Every day | ORAL | Status: DC
  Administered 2019-08-23: 120 mg via ORAL
  Filled 2019-08-23: qty 1

## 2019-08-23 NOTE — Progress Notes (Signed)
Pt is currently awaiting for PTAR.  Pt's spouse at bedside.

## 2019-08-23 NOTE — Care Management Important Message (Signed)
Important Message  Patient Details  Name: Brenda [name] MRN: [id number] Date of Birth: 1940/10/08   Medicare Important Message Given:  Yes     Leone Haven, RN 08/23/2019, 3:59 PM

## 2019-08-23 NOTE — Progress Notes (Signed)
Nurse called [facility] for report to receiving RN Archie Patten

## 2019-08-23 NOTE — Progress Notes (Signed)
D/C instructions printed and placed in packet at nursing station for transport. Tele and IV removed. Tolerated well. Awaiting PTAR for transport.

## 2019-08-23 NOTE — TOC Transition Note (Signed)
Transition of Care [facility]) - CM/SW Discharge Note   Patient Details  Name: Brenda [name] MRN: [id number] Date of Birth: [birth year]  [facility] CM/SW Contact:  Eduard Roux, LCSWA Phone Number: 08/23/2019, 1:53 PM   Clinical Narrative:     Patient will DC to: [facility]  DC Date: 08/23/2019 Family Notified: David,spouse Transport By: Sharin Mons @ 4pm-per request of SNF   Per MD patient is ready for discharge. RN, patient, and facility notified of DC. Discharge Summary sent to facility. RN given number for [phone]. Ambulance transport requested for patient.   Clinical Social Worker signing off.  Antony Blackbird, MSW, LCSWA Clinical Social Worker    Final next level of care: Skilled Nursing Facility Barriers to Discharge: Barriers Resolved   Patient Goals and CMS Choice   CMS Medicare.gov Compare Post Acute Care list provided to:: Patient Choice offered to / list presented to : Patient  Discharge Placement PASRR number recieved: 08/17/19            Patient chooses bed at: [facility] Patient to be transferred to facility by: PTAR Name of family member notified: David,spouse Patient and family notified of of transfer: 08/23/19  Discharge Plan and Services                                     Social Determinants of Health (SDOH) Interventions     Readmission Risk Interventions No flowsheet data found.

## 2019-08-23 NOTE — Progress Notes (Signed)
Occupational Therapy Treatment Patient Details Name: Brenda [name] MRN: [id number] DOB: December 14, 1940 Today's Date: 08/23/2019    History of present illness Pt is a 79 y.o. female  with past medical history of GERD, status post Nissen fundoplication, paraesophageal hiatal hernia repair, acute kidney injury, enteritis, presented to hospital on 08/14/2019 with complaints of intractable nausea, vomiting and diarrhea for 4 weeks.  During hospitalization pt has been hypotensive. Admitted with volume depletion, AKI, hypotension, intractable N/V/D, hypokalemia, and hypomangesemia   OT comments  Patient continues to make steady progress towards goals in skilled OT session. Patient's session encompassed functional mobility and sit<>stand transfers as pt is incredibly motivated to "walk without assistance like I did three weeks ago". While pt is motivated, pt demonstrated frustration with regard to limited activity tolerance, as she required frequent rest breaks throughout session (VSS) stating she felt "winded, and needed to catch her breath". Discharge remains appropriate at this time; will continue to follow acutely.   Follow Up Recommendations  SNF    Equipment Recommendations  Other (comment) (Defer till next venue)    Recommendations for Other Services      Precautions / Restrictions Precautions Precautions: Fall Precaution Comments: hypotension Restrictions Weight Bearing Restrictions: No       Mobility Bed Mobility Overal bed mobility: Needs Assistance Bed Mobility: Supine to Sit     Supine to sit: Min guard        Transfers Overall transfer level: Needs assistance Equipment used: Rolling walker (2 wheeled) Transfers: Sit to/from [employer] Sit to Stand: Min assist Stand pivot transfers: Min assist       General transfer comment: Min A to power up and steady once on feet    Balance Overall balance assessment: Needs assistance Sitting-balance support: No  upper extremity supported Sitting balance-Leahy Scale: Fair Sitting balance - Comments: able to maintain static sitting with legs crossed   Standing balance support: Bilateral upper extremity supported Standing balance-Leahy Scale: Poor Standing balance comment: <10 secs for stand pivot with RW as well as less than 30 seconds in standing at EOB                           ADL either performed or assessed with clinical judgement   ADL Overall ADL's : Needs assistance/impaired     Grooming: Minimal assistance;Sitting;Brushing hair Grooming Details (indicate cue type and reason): Tremulous at baseline, however able to complete with extra time             Lower Body Dressing: Bed level;Set up Lower Body Dressing Details (indicate cue type and reason): Able to don socks in bed, however required significant extra time in order to complete (tremors) but also requiring rest breaks in between Toilet Transfer: Min Hotel manager Details (indicate cue type and reason): Simulated with sit to stands and ambulation         Functional mobility during ADLs: Cueing for safety;Rolling walker;Minimal assistance General ADL Comments: Session focus on ambulating around room as pt wanted to get back to baseline, VSS, but pt could tolerate ambulation for no longer than 10 feet or standing for less than 20 seconds     Vision       Perception     Praxis      Cognition Arousal/Alertness: Awake/alert Behavior During Therapy: WFL for tasks assessed/performed Overall Cognitive Status: Within Functional Limits for tasks assessed  General Comments: Cognition intact, however incredibly fatigued from minimal work        Exercises     Shoulder Instructions       General Comments      Pertinent Vitals/ Pain       Pain Assessment: No/denies pain  Home Living                                           Prior Functioning/Environment              Frequency  Min 2X/week        Progress Toward Goals  OT Goals(current goals can now be found in the care plan section)  Progress towards OT goals: Progressing toward goals  Acute Rehab OT Goals Patient Stated Goal: "I want to walk." OT Goal Formulation: With patient Time For Goal Achievement: 08/31/19 Potential to Achieve Goals: Good  Plan Discharge plan remains appropriate    Co-evaluation                 AM-PAC OT "6 Clicks" Daily Activity     Outcome Measure   Help from another person eating meals?: A Lot Help from another person taking care of personal grooming?: A Little Help from another person toileting, which includes using toliet, bedpan, or urinal?: A Little Help from another person bathing (including washing, rinsing, drying)?: A Lot Help from another person to put on and taking off regular upper body clothing?: A Little Help from another person to put on and taking off regular lower body clothing?: A Little 6 Click Score: 16    End of Session Equipment Utilized During Treatment: Gait belt;Rolling walker  OT Visit Diagnosis: Unsteadiness on feet (R26.81);Muscle weakness (generalized) (M62.81);Other symptoms and signs involving cognitive function   Activity Tolerance Patient tolerated treatment well   Patient Left in bed;with call bell/phone within reach;with family/visitor present (Pt wanting to sit EOB)   Nurse Communication Mobility status        Time: 3825-0539 OT Time Calculation (min): 31 min  Charges: OT General Charges $OT Visit: 1 Visit OT Treatments $Self Care/Home Management : 23-37 mins  Pollyann Glen E. Ryane Konieczny, COTA/L Acute Rehabilitation Services [phone]   Cherlyn Cushing 08/23/2019, 11:47 AM

## 2019-08-23 NOTE — Progress Notes (Signed)
Physical Therapy Treatment Patient Details Name: Brenda [name] MRN: [id number] DOB: 1940-05-03 Today's Date: 08/23/2019    History of Present Illness Pt is a 79 y.o. female  with past medical history of GERD, status post Nissen fundoplication, paraesophageal hiatal hernia repair, acute kidney injury, enteritis, presented to hospital on 08/14/2019 with complaints of intractable nausea, vomiting and diarrhea for 4 weeks.  During hospitalization pt has been hypotensive. Admitted with volume depletion, AKI, hypotension, intractable N/V/D, hypokalemia, and hypomangesemia    PT Comments    Pt was seen for mobility in her room and reported being quite tired from the venture.  Her sats dropped to 87% on R hand and 71% L hand, reported the findings to nursing.  Follow acutely for progression of mobility as tolerated but will need supplemental O2 for the effort to walk and exercise.  Transfer to rehab setting when released.     Follow Up Recommendations  SNF     Equipment Recommendations  Wheelchair cushion (measurements PT);Wheelchair (measurements PT);3in1 (PT) (obtain in next venue)    Recommendations for Other Services       Precautions / Restrictions Precautions Precautions: Fall Precaution Comments: hypotension Restrictions Weight Bearing Restrictions: No    Mobility  Bed Mobility Overal bed mobility: Needs Assistance Bed Mobility: Supine to Sit;Sit to Supine Rolling: Min guard   Supine to sit: Min guard Sit to supine: Min assist      Transfers Overall transfer level: Needs assistance Equipment used: Rolling walker (2 wheeled);1 person hand held assist Transfers: Sit to/from Stand Sit to Stand: Min assist Stand pivot transfers: Min assist       General transfer comment: min assist with occas moment of impulsivity to turn without safety  Ambulation/Gait Ambulation/Gait assistance: Min assist;Min guard Gait Distance (Feet): 30 Feet (15 x 2) Assistive device: Rolling  walker (2 wheeled) Gait Pattern/deviations: Step-through pattern;Wide base of support;Decreased stride length Gait velocity: reduced Gait velocity interpretation: <1.8 ft/sec, indicate of risk for recurrent falls General Gait Details: pt had SOB and tired feeling quickly on room air to stand up and walk.  Sats were 71% on L hand and 87% R hand   Stairs             Wheelchair Mobility    Modified Rankin (Stroke Patients Only)       Balance Overall balance assessment: Needs assistance Sitting-balance support: Feet supported Sitting balance-Leahy Scale: Fair Sitting balance - Comments: able to maintain static sitting with legs crossed   Standing balance support: Bilateral upper extremity supported;During functional activity Standing balance-Leahy Scale: Poor Standing balance comment: SOB and fatigue with standing on room air                            Cognition Arousal/Alertness: Awake/alert Behavior During Therapy: WFL for tasks assessed/performed Overall Cognitive Status: Within Functional Limits for tasks assessed                                 General Comments: Cognition intact, however incredibly fatigued from minimal work      Exercises      General Comments General comments (skin integrity, edema, etc.): Pt was seen for mobility on RW, noted fatigue walking to BR and had to sit in room before the completion of the task      Pertinent Vitals/Pain Pain Assessment: No/denies pain    Home Living  Prior Function            PT Goals (current goals can now be found in the care plan section) Acute Rehab PT Goals Patient Stated Goal: to go home from rehab Progress towards PT goals: Progressing toward goals    Frequency    Min 2X/week      PT Plan Current plan remains appropriate    Co-evaluation              AM-PAC PT "6 Clicks" Mobility   Outcome Measure  Help needed turning from  your back to your side while in a flat bed without using bedrails?: A Little Help needed moving from lying on your back to sitting on the side of a flat bed without using bedrails?: A Little Help needed moving to and from a bed to a chair (including a wheelchair)?: A Little Help needed standing up from a chair using your arms (e.g., wheelchair or bedside chair)?: A Little Help needed to walk in hospital room?: A Little Help needed climbing 3-5 steps with a railing? : A Lot 6 Click Score: 17    End of Session Equipment Utilized During Treatment: Gait belt Activity Tolerance: Patient limited by fatigue;Treatment limited secondary to medical complications (Comment) Patient left: in bed;with call bell/phone within reach;with family/visitor present Nurse Communication: Mobility status;Other (comment) (shared drop in sat while walking) PT Visit Diagnosis: Unsteadiness on feet (R26.81);Other abnormalities of gait and mobility (R26.89);Muscle weakness (generalized) (M62.81)     Time: 2671-2458 PT Time Calculation (min) (ACUTE ONLY): 23 min  Charges:  $Gait Training: 8-22 mins $Therapeutic Activity: 8-22 mins                   Ivar Drape 08/23/2019, 2:28 PM   Samul Dada, PT MS Acute Rehab Dept. Number: [facility] [id number] and [facility] [phone]

## 2019-08-23 NOTE — Discharge Summary (Signed)
Physician Discharge Summary  Brenda [name]:[id number] DOB: 06-Apr-1940 DOA: 08/13/2019  PCP: Clovis Riley, L.August Saucer, MD  Admit date: 08/13/2019 Discharge date: 08/23/2019  Admitted From: Home Disposition: SNF  Recommendations for Outpatient Follow-up:  1. Follow up with PCP in 1 week 2. Please follow up on the following pending results: None   Discharge Condition: Stable CODE STATUS: Full code Diet recommendation: Heart healthy   Brief/Interim Summary:  Admission HPI written by Fran Lowes, DO   Chief Complaint: Intractable nausea/vomiting/diarrhea with worsening generalized weakness and occasional epigastric abdominal pain. HPI: The patient is a 79 yr old woman who carries a past medical history significant for Hiatal hernia (s/p Nissan Fundoplication 03/2015), chronic diarrhea, IBS, GERD, Essential Tremor, colon polyps). She states that she has had intractable nausea, vomiting, and diarrhea for the past 4 weeks. It has become particularly severe in the past week. The patient states that she has chronic diarrhea which has been ongoing for years. She sees Dr. Evette Cristal with [facility]. Although from her PTA med list it appears that the patient is being treated for IBS, the patient states that Dr. 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 She states that it had been under control with medications that she was put on for it until the last 4 months when she has had more problems.  She states that in the last day or two her diarrhea has slowed down, because she isn't able to keep anything down. She states that everything she swallows she throws up. She states that she has epigastric abdominal pain. She also has been throwing up her medications.   Hospital course:  Hypotension Patient with hypovolemia leading to hypotension related to intractable nausea/vomiting/diarrhea. Patient required ICU admission and vasopressor  support to sustain adequate blood pressure control. Patient was in the ICU from 7/26 - 7/28. Hypotension is resolved.  Possible pneumonia Patient with concern for pneumonia with associated cough. Unclear of diagnosis, however, patient was treated for five days on Unasyn and has completed course.  Cryptosporidium infection Enteritis Collagenous colitis Patient initially treated with Solu-medrol which was eventually discontinued. Patient is not immunocompromised. Self-limiting. Supportive care and now symptoms have resolved. Per husband, patient has had intermittent recurrences of diarrheal illness. They plan on following up with her outpatient GI physician, Dr. Evette Cristal for recurrent issues.  Hypokalemia Hypomagnesemia Secondary to vomiting/diarrhea. Supplementation given. Continue as needed.  Hypertension Suffered hypotension during this admission. Patientis on lisinopril and Verapamil as an outpatient. Discontinue Verapamil. Can reintroduce antihypertensives as needed.  Hyperlipidemia Continue Lipitor  Anxiety Continue Xanax prn   Discharge Diagnoses:  Principal Problem:   Volume depletion Active Problems:   Paraesophageal hiatal hernia s/p repair w biologic mesh 03/26/2015   AKI (acute kidney injury) (HCC)   Hiatal hernia with obstruction but no gangrene   Nausea vomiting and diarrhea   Hypotension   Enteritis   Volume depletion, gastrointestinal loss   Protein-calorie malnutrition, severe    Discharge Instructions   Allergies as of 08/23/2019      Reactions   Diclofenac Other (See Comments)   Stomach burning   Macrodantin [nitrofurantoin Macrocrystal] Itching, Nausea And Vomiting, Swelling, Rash   Tongue swelling   Tramadol Other (See Comments)   Stomach burning   Codeine Nausea And Vomiting   Topamax [topiramate] Other (See Comments)   Loss of taste, fatigue      Medication List    STOP  taking these medications   ibuprofen 200 MG tablet Commonly known as:  ADVIL   lisinopril 10 MG tablet Commonly known as: ZESTRIL   verapamil 240 MG CR tablet Commonly known as: CALAN-SR     TAKE these medications   (feeding supplement) PROSource Plus liquid Take 30 mLs by mouth 3 (three) times daily between meals.   ALPRAZolam 0.25 MG tablet Commonly known as: XANAX Take 1 tablet (0.25 mg total) by mouth daily as needed (tremors).   atorvastatin 20 MG tablet Commonly known as: LIPITOR Take 20 mg by mouth every Monday, Wednesday, and Friday.   budesonide 3 MG 24 hr capsule Commonly known as: ENTOCORT EC Take 9 mg by mouth daily before breakfast.   cholestyramine 4 g packet Commonly known as: QUESTRAN Take 1 packet by mouth daily.   famotidine 40 MG tablet Commonly known as: PEPCID Take 40 mg by mouth daily.   PreserVision AREDS 2 Caps Take 1 capsule by mouth daily.   PREVAGEN PO Take 1 capsule by mouth daily.   primidone 50 MG tablet Commonly known as: MYSOLINE Take 2 tablets (100 mg total) by mouth 3 (three) times daily.   promethazine 25 MG tablet Commonly known as: PHENERGAN Take 25 mg by mouth 5 (five) times daily as needed for nausea or vomiting.   Viberzi 100 MG Tabs Generic drug: Eluxadoline Take 100 mg by mouth 2 (two) times daily.   Vitamin D (Ergocalciferol) 1.25 MG (50000 UNIT) Caps capsule Commonly known as: DRISDOL Take 50,000 Units by mouth every 7 (seven) days.       Contact information for after-discharge care    Destination    [facility] Preferred SNF .   Service: Skilled Nursing Contact information: [address] [place] [address] [phone]                 Allergies  Allergen Reactions  . Diclofenac Other (See Comments)    Stomach burning  . Macrodantin [Nitrofurantoin Macrocrystal] Itching, Nausea And Vomiting, Swelling and Rash    Tongue swelling  . Tramadol Other (See Comments)    Stomach burning  . Codeine Nausea And Vomiting  . Topamax [Topiramate]  Other (See Comments)    Loss of taste, fatigue    Consultations:  PCCM  Gastroenterology   Procedures/Studies: CT ABDOMEN PELVIS WO CONTRAST  Result Date: 08/13/2019 CLINICAL DATA:  79 year old female with abdominal pain. Concern for acute diverticulitis. EXAM: CT ABDOMEN AND PELVIS WITHOUT CONTRAST TECHNIQUE: Multidetector CT imaging of the abdomen and pelvis was performed following the standard protocol without IV contrast. COMPARISON:  CT abdomen pelvis dated 09/08/2015. FINDINGS: Evaluation of this exam is limited in the absence of intravenous contrast. Lower chest: There is diffuse interstitial and interlobular septal prominence consistent with edema. There are small bilateral pleural effusions. Coronary vascular calcifications noted. There is no intra-abdominal free air or free fluid. Hepatobiliary: The liver is unremarkable. No intrahepatic biliary duct dilatation. The gallbladder is unremarkable. Pancreas: Unremarkable. No pancreatic ductal dilatation or surrounding inflammatory changes. Spleen: Normal in size without focal abnormality. Adrenals/Urinary Tract: The adrenal glands are unremarkable. The kidneys, visualized ureters, and urinary bladder appear unremarkable. Stomach/Bowel: There is mild diffuse mesenteric stranding and edema with inflammatory changes adjacent to a loop of small bowel in the right lower quadrant. Findings may represent enteritis. Clinical correlation is recommended. There is no bowel obstruction. Loose stool noted within the colon compatible with diarrheal state. There is somewhat featureless appearance of the colon which may be related  to underlying chronic inflammatory bowel disease. Correlation with history of ulcerative colitis recommended. There is a moderate size hiatal hernia containing a portion of the stomach. The appendix is not visualized with certainty. No inflammatory changes identified in the right lower quadrant. Vascular/Lymphatic: Advanced aortoiliac  atherosclerotic disease. The IVC is unremarkable. No portal venous gas. There is no adenopathy. Reproductive: The uterus and ovaries are grossly unremarkable. Other: None Musculoskeletal: Osteopenia with degenerative changes of the spine. Old L2 compression fracture and vertebroplasty. No acute osseous pathology. IMPRESSION: 1. Findings likely represent enteritis and diarrheal state. Clinical correlation is recommended. No bowel obstruction. 2. Moderate size hiatal hernia containing a portion of the stomach. 3. Aortic Atherosclerosis (ICD10-I70.0). Electronically Signed   By: Elgie Collard M.D.   On: 08/13/2019 18:42   DG Chest 2 View  Result Date: 08/13/2019 CLINICAL DATA:  Weakness EXAM: CHEST - 2 VIEW COMPARISON:  September 28, 2017 FINDINGS: The cardiomediastinal silhouette is unchanged in contour.Atherosclerotic calcifications. No pleural effusion. No pneumothorax. Diffuse interstitial prominence, nonspecific. Mild LEFT basilar heterogeneous opacity. Visualized abdomen is unremarkable. Multilevel degenerative changes of the thoracic spine. Status post kyphoplasty of a lumbar vertebral body. IMPRESSION: Mild LEFT basilar heterogeneous opacity. Differential considerations include aspiration, atelectasis or infection. Electronically Signed   By: Meda Klinefelter MD   On: 08/13/2019 16:09   DG CHEST PORT 1 VIEW  Result Date: 08/15/2019 CLINICAL DATA:  79 year old female with shortness of breath. EXAM: PORTABLE CHEST 1 VIEW COMPARISON:  Chest radiograph dated 08/14/2019. FINDINGS: Small to moderate bilateral pleural effusions with associated bibasilar atelectasis. Pneumonia is not excluded clinical correlation is recommended. There is mild cardiomegaly with mild vascular congestion. No pneumothorax. Atherosclerotic calcification of the aorta. No acute osseous pathology. IMPRESSION: 1. Cardiomegaly with mild vascular congestion and small to moderate bilateral pleural effusions. 2. Bibasilar atelectasis.  Pneumonia is not excluded. Overall no significant interval change since the prior radiograph. Electronically Signed   By: Elgie Collard M.D.   On: 08/15/2019 15:59   DG Chest Port 1 View  Result Date: 08/14/2019 CLINICAL DATA:  Hypoxia EXAM: PORTABLE CHEST 1 VIEW COMPARISON:  08/13/2019 FINDINGS: Cardiac shadow is stable. Aortic calcifications are noted. Increasing left basilar infiltrate and effusion is seen. Some increase in right basilar opacity is noted as well. Mild vascular congestion is seen. No bony abnormality is noted. IMPRESSION: Increasing bibasilar airspace opacity with left-sided effusion. Electronically Signed   By: Alcide Clever M.D.   On: 08/14/2019 22:46   DG UGI W SINGLE CM (SOL OR THIN BA)  Result Date: 08/14/2019 CLINICAL DATA:  Vomiting and regurgitation. EXAM: UPPER GI SERIES WITH KUB TECHNIQUE: After obtaining a scout radiograph a routine upper GI series was performed using thin barium FLUOROSCOPY TIME:  Fluoroscopy Time:  2 minutes and 48 seconds. Radiation Exposure Index (if provided by the fluoroscopic device): 44.2 mGy Number of Acquired Spot Images: COMPARISON:  Abdomen pelvis CT 08/13/2019.  Esophagram 06/18/2015. FINDINGS: Pre-procedure KUB shows gaseous distention of stomach and colon. Upper lumbar vertebral augmentation evident. Limited study due to patient immobility. Exam was performed with the patient RAO and LPO on the fluoro table positioned at 30 degree head up ankle. Patient was given sips of contrast material from a cup. This demonstrates disruption of primary peristalsis with tertiary contractions in the distal third of the esophagus. Distal esophageal wall appears irregular with a potential tiny distal esophageal diverticulum. As noted on the CT scan, there is a moderate hiatal hernia with approximately 25-50% of the stomach above the  diaphragmatic hiatus. Patient is reportedly status post fundoplication which may account for deformity at the gastroesophageal  junction. This deformity may also be aggravated by the hiatal hernia. Otherwise no gross gastric mass lesion evident. Gastric emptying is prompt. Duodenal C-loop and ligament of Treitz are normally positioned. IMPRESSION: 1. Markedly limited study due to patient immobility. 2. Disruption of primary peristalsis with tertiary contractions in the distal third of the esophagus. There is some smooth wall irregularity in the distal esophagus, indeterminate. Potential tiny distal esophageal diverticulum. 3. Moderate hiatal hernia with 25-50% of the stomach contained in the chest. Deformity of the esophagogastric junction may be related to fundoplication and aggravated by the hiatal hernia. 4. Prompt gastric emptying with normal position of the duodenum and ligament of Treitz. 5. Due to the markedly limited nature of this study, follow-up EGD or dedicated upper GI series after patient recovers from acute symptoms recommended. Electronically Signed   By: Kennith Center M.D.   On: 08/14/2019 09:40   DG Swallowing Func-Speech Pathology  Result Date: 08/20/2019 Objective Swallowing Evaluation: Type of Study: MBS-Modified Barium Swallow Study  Patient Details Name: ARLIENE ROSENOW MRN: [id number] Date of Birth: 27-Mar-1940 Today's Date: 08/20/2019 Time: SLP Start Time (ACUTE ONLY): 1430 -SLP Stop Time (ACUTE ONLY): 1450 SLP Time Calculation (min) (ACUTE ONLY): 20 min Past Medical History: Past Medical History: Diagnosis Date . Anxiety  . Chronic diarrhea  . Coarse tremors  . Colon polyp  . DDD (degenerative disc disease), cervical  . Hiatal hernia  . History of skin cancer  . Hypercholesteremia  . Hypertension  . Migraines  . Osteoporosis  . Shortness of breath dyspnea   with exertion . Tremor  . Weight loss  Past Surgical History: Past Surgical History: Procedure Laterality Date . ESOPHAGEAL MANOMETRY N/A 01/15/2015  Procedure: ESOPHAGEAL MANOMETRY (EM);  Surgeon: Graylin Shiver, MD;  Location: [facility];  Service: Endoscopy;   Laterality: N/A; . HIATAL HERNIA REPAIR   . KYPHOPLASTY N/A 12/22/2017  Procedure: LUMBAR TWO KYPHOPLASTY;  Surgeon: Estill Bamberg, MD;  Location: [facility] OR;  Service: Orthopedics;  Laterality: N/A;  LUMBAR TWO KYPHOPLASTY . RHINOPLASTY   . SINUS SURGERY WITH INSTATRAK   HPI: Pt is a 79 yr old female with past medical history significant for hiatal hernia (s/p Nissan Fundoplication 03/2015), chronic diarrhea, IBS, paraesophageal hiatal hernia repair, GERD, essential tremor, colon polyps) who was admitted secondary to intractable nausea, vomiting, and diarrhea for the past 4 weeks which became particularly severe in the past week. CXR 7/28: Cardiomegaly with mild vascular congestion and small to moderate bilateral pleural effusions. Bibasilar atelectasis. Pneumonia is not excluded. Unasyn started due to concern for aspiration pneumonia. CT abdomen: Moderate size hiatal hernia containing a portion of the stomach.  No data recorded Assessment / Plan / Recommendation CHL IP CLINICAL IMPRESSIONS 08/20/2019 Clinical Impression Pt was intermittently tearful throughout the study and required encouragement to participate in the evaluation. She stated that she "would do better" if her husband were present. Pt's unit and nurse were contacted; however, pt's husband was not in the room and she indicated that would continue without him.  The study was somewhat limited due to pt spitting out dysphagia 1 solids, refusing consecutive swallows and taking very small boluses of regular texture solids. She presented with oropharyngeal dysphagia characterized by piecemeal deglutition, reduced bolus cohesion with premature spillage, and a mild pharyngeal delay which together resulted in an episode of sensed aspiration (PAS 6) with the initial bolus of thin liquids via  cup and subsequent expulsion with coughing. However, no subsequent instances of laryngeal invasion were noted with solids or liquids via straw and her oropharyngeal swallow  mechanism was otherwise within functional limits. Screening of the esophageal phase of the pt's swallow revealed stasis of the 79mm barium tablet in the upper thoracic esophagus. The tablet was resistant to movement with additional intake of thin liquids or with nectar thick liquids, but was ultimately transported to the stomach following intake of an additional bolus of dysphagia 3 solids.  SLP Visit Diagnosis Dysphagia, unspecified (R13.10) Attention and concentration deficit following -- Frontal lobe and executive function deficit following -- Impact on safety and function No limitations   CHL IP TREATMENT RECOMMENDATION 08/20/2019 Treatment Recommendations Therapy as outlined in treatment plan below   No flowsheet data found. CHL IP DIET RECOMMENDATION 08/20/2019 SLP Diet Recommendations Dysphagia 3 (Mech soft) solids;Thin liquid Liquid Administration via Cup;No straw Medication Administration Whole meds with liquid Compensations Small sips/bites;Slow rate;Follow solids with liquid Postural Changes Seated upright at 90 degrees   CHL IP OTHER RECOMMENDATIONS 08/20/2019 Recommended Consults -- Oral Care Recommendations Oral care BID Other Recommendations --   CHL IP FOLLOW UP RECOMMENDATIONS 08/20/2019 Follow up Recommendations None   CHL IP FREQUENCY AND DURATION 08/20/2019 Speech Therapy Frequency (ACUTE ONLY) min 2x/week Treatment Duration 2 weeks      CHL IP ORAL PHASE 08/20/2019 Oral Phase Impaired Oral - Pudding Teaspoon -- Oral - Pudding Cup -- Oral - Honey Teaspoon -- Oral - Honey Cup -- Oral - Nectar Teaspoon -- Oral - Nectar Cup -- Oral - Nectar Straw Decreased bolus cohesion;Premature spillage;Piecemeal swallowing Oral - Thin Teaspoon -- Oral - Thin Cup Decreased bolus cohesion;Premature spillage;Piecemeal swallowing Oral - Thin Straw Decreased bolus cohesion;Premature spillage;Piecemeal swallowing Oral - Puree WFL Oral - Mech Soft WFL Oral - Regular WFL Oral - Multi-Consistency -- Oral - Pill -- Oral Phase -  Comment --  CHL IP PHARYNGEAL PHASE 08/20/2019 Pharyngeal Phase Impaired Pharyngeal- Pudding Teaspoon -- Pharyngeal -- Pharyngeal- Pudding Cup -- Pharyngeal -- Pharyngeal- Honey Teaspoon -- Pharyngeal -- Pharyngeal- Honey Cup -- Pharyngeal -- Pharyngeal- Nectar Teaspoon -- Pharyngeal -- Pharyngeal- Nectar Cup Delayed swallow initiation-vallecula Pharyngeal -- Pharyngeal- Nectar Straw -- Pharyngeal -- Pharyngeal- Thin Teaspoon -- Pharyngeal -- Pharyngeal- Thin Cup Delayed swallow initiation-vallecula;Delayed swallow initiation-pyriform sinuses;Penetration/Aspiration before swallow Pharyngeal Material enters airway, passes BELOW cords then ejected out Pharyngeal- Thin Straw Delayed swallow initiation-vallecula Pharyngeal -- Pharyngeal- Puree -- Pharyngeal -- Pharyngeal- Mechanical Soft WFL Pharyngeal -- Pharyngeal- Regular WFL Pharyngeal -- Pharyngeal- Multi-consistency -- Pharyngeal -- Pharyngeal- Pill -- Pharyngeal -- Pharyngeal Comment --  CHL IP CERVICAL ESOPHAGEAL PHASE 08/20/2019 Cervical Esophageal Phase WFL Pudding Teaspoon -- Pudding Cup -- Honey Teaspoon -- Honey Cup -- Nectar Teaspoon -- Nectar Cup -- Nectar Straw -- Thin Teaspoon -- Thin Cup -- Thin Straw -- Puree -- Mechanical Soft -- Regular -- Multi-consistency -- Pill -- Cervical Esophageal Comment -- Shanika I. Vear Clock, MS, CCC-SLP Acute Rehabilitation Services Office number [phone] Pager [phone] Scheryl Marten 08/20/2019, 5:45 PM              ECHOCARDIOGRAM COMPLETE  Result Date: 08/20/2019    ECHOCARDIOGRAM REPORT   Patient Name:   KAMARIAH FRUCHTER Date of Exam: 08/20/2019 Medical Rec #:  [id number]     Height:       64.0 in Accession #:    [id number]    Weight:       112.0 lb Date of Birth:  1940-07-29  BSA:          1.529 m Patient Age:    78 years      BP:           115/66 mmHg Patient Gender: F             HR:           94 bpm. Exam Location:  Inpatient Procedure: 2D Echo, Cardiac Doppler, Color Doppler and Intracardiac             Opacification Agent Indications:    Dyspnea 786.09 / R06.00  History:        Patient has no prior history of Echocardiogram examinations.                 Signs/Symptoms:Shortness of Breath; Risk Factors:Hypertension,                 Dyslipidemia and Former Smoker. GERD.  Sonographer:    Renella Cunas RDCS Referring Phys: [id number] [facility] [place]OKHREL IMPRESSIONS  1. Left ventricular ejection fraction, by estimation, is 65 to 70%. The left ventricle has normal function. The left ventricle has no regional wall motion abnormalities. Left ventricular diastolic parameters are consistent with Grade I diastolic dysfunction (impaired relaxation).  2. Right ventricular systolic function is normal. The right ventricular size is normal. There is normal pulmonary artery systolic pressure. The estimated right ventricular systolic pressure is 31.7 mmHg.  3. The mitral valve is degenerative. Mild to moderate mitral valve regurgitation. No evidence of mitral stenosis.  4. Tricuspid valve regurgitation is mild to moderate.  5. The aortic valve is abnormal. Aortic valve regurgitation is trivial. No aortic stenosis is present.  6. The inferior vena cava is normal in size with greater than 50% respiratory variability, suggesting right atrial pressure of 3 mmHg. FINDINGS  Left Ventricle: Left ventricular ejection fraction, by estimation, is 65 to 70%. The left ventricle has normal function. The left ventricle has no regional wall motion abnormalities. Definity contrast agent was given IV to delineate the left ventricular  endocardial borders. The left ventricular internal cavity size was normal in size. There is no left ventricular hypertrophy. Left ventricular diastolic parameters are consistent with Grade I diastolic dysfunction (impaired relaxation). Right Ventricle: The right ventricular size is normal. No increase in right ventricular wall thickness. Right ventricular systolic function is normal. There is normal pulmonary artery systolic  pressure. The tricuspid regurgitant velocity is 2.68 m/s, and  with an assumed right atrial pressure of 3 mmHg, the estimated right ventricular systolic pressure is 31.7 mmHg. Left Atrium: Left atrial size was normal in size. Right Atrium: Right atrial size was normal in size. Pericardium: There is no evidence of pericardial effusion. Mitral Valve: The mitral valve is degenerative in appearance. Normal mobility of the mitral valve leaflets. Mild mitral annular calcification. Mild to moderate mitral valve regurgitation. No evidence of mitral valve stenosis. Tricuspid Valve: The tricuspid valve is normal in structure. Tricuspid valve regurgitation is mild to moderate. No evidence of tricuspid stenosis. Aortic Valve: The aortic valve is abnormal. Aortic valve regurgitation is trivial. No aortic stenosis is present. There is moderate calcification of the aortic valve. Pulmonic Valve: The pulmonic valve was normal in structure. Pulmonic valve regurgitation is trivial. No evidence of pulmonic stenosis. Aorta: The aortic root is normal in size and structure. Venous: The inferior vena cava is normal in size with greater than 50% respiratory variability, suggesting right atrial pressure of 3 mmHg. IAS/Shunts: No atrial level shunt detected by  color flow Doppler.  LEFT VENTRICLE PLAX 2D LVIDd:         3.94 cm  Diastology LVIDs:         3.14 cm  LV e' lateral:   4.46 cm/s LV PW:         0.86 cm  LV E/e' lateral: 13.5 LV IVS:        0.88 cm  LV e' medial:    3.59 cm/s LVOT diam:     1.90 cm  LV E/e' medial:  16.7 LV SV:         36 LV SV Index:   23 LVOT Area:     2.84 cm  RIGHT VENTRICLE RV S prime:     14.30 cm/s TAPSE (M-mode): 1.6 cm LEFT ATRIUM             Index       RIGHT ATRIUM           Index LA diam:        3.60 cm 2.35 cm/m  RA Area:     10.10 cm LA Vol (A2C):   29.4 ml 19.23 ml/m RA Volume:   20.50 ml  13.41 ml/m LA Vol (A4C):   26.1 ml 17.07 ml/m LA Biplane Vol: 28.2 ml 18.44 ml/m  AORTIC VALVE LVOT Vmax:    67.80 cm/s LVOT Vmean:  44.500 cm/s LVOT VTI:    0.126 m  AORTA Ao Root diam: 2.90 cm MITRAL VALVE               TRICUSPID VALVE MV Area (PHT): 4.41 cm    TR Peak grad:   28.7 mmHg MV Decel Time: 172 msec    TR Vmax:        268.00 cm/s MR Peak grad: 107.3 mmHg MR Vmax:      518.00 cm/s  SHUNTS MV E velocity: 60.00 cm/s  Systemic VTI:  0.13 m MV A velocity: 70.30 cm/s  Systemic Diam: 1.90 cm MV E/A ratio:  0.85 Weston Brass MD Electronically signed by Weston Brass MD Signature Date/Time: 08/20/2019/4:10:23 PM    Final       Subjective: No issues overnight  Discharge Exam: Vitals:   08/23/19 1125 08/23/19 1139  BP: (!) 104/52   Pulse: 87 (!) 110  Resp: 15   Temp: 98.2 F (36.8 C)   SpO2: 97% 98%   Vitals:   08/23/19 0347 08/23/19 0833 08/23/19 1125 08/23/19 1139  BP: (!) 99/53 (!) 110/56 (!) 104/52   Pulse: 79 87 87 (!) 110  Resp: 15  15   Temp: 98.4 F (36.9 C)  98.2 F (36.8 C)   TempSrc: Oral  Oral   SpO2: 97%  97% 98%  Weight: 45.4 kg     Height:        General: Pt is alert, awake, not in acute distress Cardiovascular: RRR, S1/S2 +, no rubs, no gallops Respiratory: CTA bilaterally, no wheezing, no rhonchi Abdominal: Soft, NT, ND, bowel sounds + Extremities: no edema, no cyanosis    The results of significant diagnostics from this hospitalization (including imaging, microbiology, ancillary and laboratory) are listed below for reference.     Microbiology: Recent Results (from the past 240 hour(s))  Blood culture (routine x 2)     Status: None   Collection Time: 08/13/19  2:54 PM   Specimen: BLOOD RIGHT WRIST  Result Value Ref Range Status   Specimen Description BLOOD RIGHT WRIST  Final   Special Requests   Final  BOTTLES DRAWN AEROBIC AND ANAEROBIC Blood Culture adequate volume   Culture   Final    NO GROWTH 5 DAYS Performed at [facility], [address], [place], [place] [address]    Report Status 08/18/2019 FINAL  Final  Blood culture (routine  x 2)     Status: None   Collection Time: 08/13/19  3:20 PM   Specimen: BLOOD RIGHT HAND  Result Value Ref Range Status   Specimen Description BLOOD RIGHT HAND  Final   Special Requests   Final    BOTTLES DRAWN AEROBIC AND ANAEROBIC Blood Culture results may not be optimal due to an inadequate volume of blood received in culture bottles   Culture   Final    NO GROWTH 5 DAYS Performed at [facility], [address] [place], [place] [address]    Report Status 08/18/2019 FINAL  Final  SARS Coronavirus 2 by RT PCR (hospital order, performed in [facility] hospital lab) Nasopharyngeal Nasopharyngeal Swab     Status: None   Collection Time: 08/13/19  7:44 PM   Specimen: Nasopharyngeal Swab  Result Value Ref Range Status   SARS Coronavirus 2 NEGATIVE NEGATIVE Final    Comment: (NOTE) SARS-CoV-2 target nucleic acids are NOT DETECTED.  The SARS-CoV-2 RNA is generally detectable in upper and lower respiratory specimens during the acute phase of infection. The lowest concentration of SARS-CoV-2 viral copies this assay can detect is 250 copies / mL. A negative result does not preclude SARS-CoV-2 infection and should not be used as the sole basis for treatment or other patient management decisions.  A negative result may occur with improper specimen collection / handling, submission of specimen other than nasopharyngeal swab, presence of viral mutation(s) within the areas targeted by this assay, and inadequate number of viral copies (<250 copies / mL). A negative result must be combined with clinical observations, patient history, and epidemiological information.  Fact Sheet for Patients:   BoilerBrush.com.cy  Fact Sheet for Healthcare Providers: https://pope.com/  This test is not yet approved or  cleared by the [place] FDA and has been authorized for detection and/or diagnosis of SARS-CoV-2 by FDA under an Emergency Use  Authorization (EUA).  This EUA will remain in effect (meaning this test can be used) for the duration of the COVID-19 declaration under Section 564(b)(1) of the Act, 21 U.S.C. section 360bbb-3(b)(1), unless the authorization is terminated or revoked sooner.  Performed at [facility], [address], [place], [place] [address]   Urine culture     Status: None   Collection Time: 08/14/19 10:27 AM   Specimen: Urine, Random  Result Value Ref Range Status   Specimen Description URINE, RANDOM  Final   Special Requests NONE  Final   Culture   Final    NO GROWTH Performed at [facility], [address] [place], [place] [address]    Report Status 08/15/2019 FINAL  Final  MRSA PCR Screening     Status: None   Collection Time: 08/14/19 10:27 AM   Specimen: Nasopharyngeal  Result Value Ref Range Status   MRSA by PCR NEGATIVE NEGATIVE Final    Comment:        The GeneXpert MRSA Assay (FDA approved for NASAL specimens only), is one component of a comprehensive MRSA colonization surveillance program. It is not intended to diagnose MRSA infection nor to guide or monitor treatment for MRSA infections. Performed at [facility], [address],  [place], [place] [address]   Gastrointestinal Panel by PCR , Stool     Status: Abnormal   Collection Time: 08/20/19 11:28 AM   Specimen: Stool  Result Value Ref Range Status   Campylobacter species NOT DETECTED NOT DETECTED Final   Plesimonas shigelloides NOT DETECTED NOT DETECTED Final   Salmonella species NOT DETECTED NOT DETECTED Final   Yersinia enterocolitica NOT DETECTED NOT DETECTED Final   Vibrio species NOT DETECTED NOT DETECTED Final   Vibrio cholerae NOT DETECTED NOT DETECTED Final   Enteroaggregative E coli (EAEC) NOT DETECTED NOT DETECTED Final   Enteropathogenic E coli (EPEC) NOT DETECTED NOT DETECTED Final   Enterotoxigenic E coli (ETEC) NOT DETECTED NOT DETECTED Final   Shiga like toxin producing E coli  (STEC) NOT DETECTED NOT DETECTED Final   Shigella/Enteroinvasive E coli (EIEC) NOT DETECTED NOT DETECTED Final   Cryptosporidium DETECTED (A) NOT DETECTED Final   Cyclospora cayetanensis NOT DETECTED NOT DETECTED Final   Entamoeba histolytica NOT DETECTED NOT DETECTED Final   Giardia lamblia NOT DETECTED NOT DETECTED Final   Adenovirus F40/41 NOT DETECTED NOT DETECTED Final   Astrovirus NOT DETECTED NOT DETECTED Final   Norovirus GI/GII NOT DETECTED NOT DETECTED Final   Rotavirus A NOT DETECTED NOT DETECTED Final   Sapovirus (I, II, IV, and V) NOT DETECTED NOT DETECTED Final    Comment: Performed at [facility], [address] [place], [place] [address]  SARS Coronavirus 2 by RT PCR (hospital order, performed in [facility] hospital lab) Nasopharyngeal Nasopharyngeal Swab     Status: None   Collection Time: 08/22/19  2:01 PM   Specimen: Nasopharyngeal Swab  Result Value Ref Range Status   SARS Coronavirus 2 NEGATIVE NEGATIVE Final    Comment: (NOTE) SARS-CoV-2 target nucleic acids are NOT DETECTED.  The SARS-CoV-2 RNA is generally detectable in upper and lower respiratory specimens during the acute phase of infection. The lowest concentration of SARS-CoV-2 viral copies this assay can detect is 250 copies / mL. A negative result does not preclude SARS-CoV-2 infection and should not be used as the sole basis for treatment or other patient management decisions.  A negative result may occur with improper specimen collection / handling, submission of specimen other than nasopharyngeal swab, presence of viral mutation(s) within the areas targeted by this assay, and inadequate number of viral copies (<250 copies / mL). A negative result must be combined with clinical observations, patient history, and epidemiological information.  Fact Sheet for Patients:   BoilerBrush.com.cy  Fact Sheet for Healthcare  Providers: https://pope.com/  This test is not yet approved or  cleared by the [place] FDA and has been authorized for detection and/or diagnosis of SARS-CoV-2 by FDA under an Emergency Use Authorization (EUA).  This EUA will remain in effect (meaning this test can be used) for the duration of the COVID-19 declaration under Section 564(b)(1) of the Act, 21 U.S.C. section 360bbb-3(b)(1), unless the authorization is terminated or revoked sooner.  Performed at [facility], [address], [place], [place] [address]      Labs: BNP (last 3 results) No results for input(s): BNP in the last 8760 hours. Basic Metabolic Panel: Recent Labs  Lab 08/17/19 0448 08/17/19 0448 08/18/19 0725 08/19/19 0515 08/20/19 9518 08/21/19 0500 08/22/19 0445  NA 136   < > 136 137 138 135 135  K 4.4   < > 3.4* 3.8 3.6 4.2 3.4*  CL 102   < > 102 101 99 102 103  CO2 25   < > GLUCOSE 152*   < > 101* 107* 97 100* 147*  BUN 14   < > 6* 5*  CREATININE 0.81   < > 0.81 0.75 0.86 0.84 0.87  CALCIUM 10.3   < > 8.6* 8.1* 7.7* 7.6* 7.6*  MG 1.6*  --  1.6* 2.1 1.7 1.7  --   PHOS 3.6  --   --  2.5  --   --   --    < > = values in this interval not displayed.   Liver Function Tests: Recent Labs  Lab 08/17/19 0448 08/18/19 0725 08/19/19 0515  AST 15 14* 23  ALT ALKPHOS 75 56 72  BILITOT 0.4 0.4 0.6  PROT 6.0* 4.8* 5.1*  ALBUMIN 2.3* 2.1* 2.1*   No results for input(s): LIPASE, AMYLASE in the last 168 hours. No results for input(s): AMMONIA in the last 168 hours. CBC: Recent Labs  Lab 08/18/19 0654 08/19/19 0515 08/20/19 0638 08/21/19 0500 08/22/19 0445  WBC 6.1 6.1 4.4 4.3 4.1  HGB 9.3* 10.2* 10.8* 10.4* 11.0*  HCT 29.0* 31.5* 32.8* 32.3* 33.6*  MCV 103.2* 101.9* 102.5* 102.2* 99.4  PLT 162 168 166 166 183   Cardiac Enzymes: No results for input(s): CKTOTAL, CKMB, CKMBINDEX, TROPONINI in the last 168  hours. BNP: Invalid input(s): POCBNP CBG: No results for input(s): GLUCAP in the last 168 hours. D-Dimer No results for input(s): DDIMER in the last 72 hours. Hgb A1c No results for input(s): HGBA1C in the last 72 hours. Lipid Profile No results for input(s): CHOL, HDL, LDLCALC, TRIG, CHOLHDL, LDLDIRECT in the last 72 hours. Thyroid function studies No results for input(s): TSH, T4TOTAL, T3FREE, THYROIDAB in the last 72 hours.  Invalid input(s): FREET3 Anemia work up No results for input(s): VITAMINB12, FOLATE, FERRITIN, TIBC, IRON, RETICCTPCT in the last 72 hours. Urinalysis    Component Value Date/Time   COLORURINE YELLOW 08/14/2019 1027   APPEARANCEUR CLEAR 08/14/2019 1027   LABSPEC 1.011 08/14/2019 1027   PHURINE 5.0 08/14/2019 1027   GLUCOSEU 150 (A) 08/14/2019 1027   HGBUR NEGATIVE 08/14/2019 1027   BILIRUBINUR NEGATIVE 08/14/2019 1027   KETONESUR 5 (A) 08/14/2019 1027   PROTEINUR NEGATIVE 08/14/2019 1027   NITRITE NEGATIVE 08/14/2019 1027   LEUKOCYTESUR NEGATIVE 08/14/2019 1027   Sepsis Labs Invalid input(s): PROCALCITONIN,  WBC,  LACTICIDVEN Microbiology Recent Results (from the past 240 hour(s))  Blood culture (routine x 2)     Status: None   Collection Time: 08/13/19  2:54 PM   Specimen: BLOOD RIGHT WRIST  Result Value Ref Range Status   Specimen Description BLOOD RIGHT WRIST  Final   Special Requests   Final    BOTTLES DRAWN AEROBIC AND ANAEROBIC Blood Culture adequate volume   Culture   Final    NO GROWTH 5 DAYS Performed at [facility], [address] [place], [place] [address]    Report Status 08/18/2019 FINAL  Final  Blood culture (routine x 2)     Status: None   Collection Time: 08/13/19  3:20 PM   Specimen: BLOOD RIGHT HAND  Result Value Ref Range Status   Specimen Description BLOOD RIGHT HAND  Final   Special Requests   Final    BOTTLES DRAWN AEROBIC AND ANAEROBIC Blood Culture results may not be optimal due to an inadequate volume of  blood received in culture bottles   Culture  Final    NO GROWTH 5 DAYS Performed at [facility], [address] [place], [place] [address]    Report Status 08/18/2019 FINAL  Final  SARS Coronavirus 2 by RT PCR (hospital order, performed in [facility] lab) Nasopharyngeal Nasopharyngeal Swab     Status: None   Collection Time: 08/13/19  7:44 PM   Specimen: Nasopharyngeal Swab  Result Value Ref Range Status   SARS Coronavirus 2 NEGATIVE NEGATIVE Final    Comment: (NOTE) SARS-CoV-2 target nucleic acids are NOT DETECTED.  The SARS-CoV-2 RNA is generally detectable in upper and lower respiratory specimens during the acute phase of infection. The lowest concentration of SARS-CoV-2 viral copies this assay can detect is 250 copies / mL. A negative result does not preclude SARS-CoV-2 infection and should not be used as the sole basis for treatment or other patient management decisions.  A negative result may occur with improper specimen collection / handling, submission of specimen other than nasopharyngeal swab, presence of viral mutation(s) within the areas targeted by this assay, and inadequate number of viral copies (<250 copies / mL). A negative result must be combined with clinical observations, patient history, and epidemiological information.  Fact Sheet for Patients:   BoilerBrush.com.cyhttps://www.fda.gov/media/136312/download  Fact Sheet for Healthcare Providers: https://pope.com/https://www.fda.gov/media/136313/download  This test is not yet approved or  cleared by the [place] FDA and has been authorized for detection and/or diagnosis of SARS-CoV-2 by FDA under an Emergency Use Authorization (EUA).  This EUA will remain in effect (meaning this test can be used) for the duration of the COVID-19 declaration under Section 564(b)(1) of the Act, 21 U.S.C. section 360bbb-3(b)(1), unless the authorization is terminated or revoked sooner.  Performed at [facility], [address] [place], [place] [address]   Urine culture     Status: None   Collection Time: 08/14/19 10:27 AM   Specimen: Urine, Random  Result Value Ref Range Status   Specimen Description URINE, RANDOM  Final   Special Requests NONE  Final   Culture   Final    NO GROWTH Performed at [facility], [address] [place], [place] [address]    Report Status 08/15/2019 FINAL  Final  MRSA PCR Screening     Status: None   Collection Time: 08/14/19 10:27 AM   Specimen: Nasopharyngeal  Result Value Ref Range Status   MRSA by PCR NEGATIVE NEGATIVE Final    Comment:        The GeneXpert MRSA Assay (FDA approved for NASAL specimens only), is one component of a comprehensive MRSA colonization surveillance program. It is not intended to diagnose MRSA infection nor to guide or monitor treatment for MRSA infections. Performed at [facility], [address] [place], [place] [address]   Gastrointestinal Panel by PCR , Stool     Status: Abnormal   Collection Time: 08/20/19 11:28 AM   Specimen: Stool  Result Value Ref Range Status   Campylobacter species NOT DETECTED NOT DETECTED Final   Plesimonas shigelloides NOT DETECTED NOT DETECTED Final   Salmonella species NOT DETECTED NOT DETECTED Final   Yersinia enterocolitica NOT DETECTED NOT DETECTED Final   Vibrio species NOT DETECTED NOT DETECTED Final   Vibrio cholerae NOT DETECTED NOT DETECTED Final   Enteroaggregative E coli (EAEC) NOT DETECTED NOT DETECTED Final   Enteropathogenic E coli (EPEC) NOT DETECTED NOT DETECTED Final   Enterotoxigenic E coli (ETEC) NOT DETECTED NOT DETECTED Final   Shiga  like toxin producing E coli (STEC) NOT DETECTED NOT DETECTED Final   Shigella/Enteroinvasive E coli (EIEC) NOT DETECTED NOT DETECTED Final   Cryptosporidium DETECTED (A) NOT DETECTED Final   Cyclospora cayetanensis NOT DETECTED NOT DETECTED Final   Entamoeba histolytica NOT DETECTED NOT DETECTED Final   Giardia lamblia NOT DETECTED NOT  DETECTED Final   Adenovirus F40/41 NOT DETECTED NOT DETECTED Final   Astrovirus NOT DETECTED NOT DETECTED Final   Norovirus GI/GII NOT DETECTED NOT DETECTED Final   Rotavirus A NOT DETECTED NOT DETECTED Final   Sapovirus (I, II, IV, and V) NOT DETECTED NOT DETECTED Final    Comment: Performed at [facility], [address] [place], [place] [address]  SARS Coronavirus 2 by RT PCR (hospital order, performed in [facility] hospital lab) Nasopharyngeal Nasopharyngeal Swab     Status: None   Collection Time: 08/22/19  2:01 PM   Specimen: Nasopharyngeal Swab  Result Value Ref Range Status   SARS Coronavirus 2 NEGATIVE NEGATIVE Final    Comment: (NOTE) SARS-CoV-2 target nucleic acids are NOT DETECTED.  The SARS-CoV-2 RNA is generally detectable in upper and lower respiratory specimens during the acute phase of infection. The lowest concentration of SARS-CoV-2 viral copies this assay can detect is 250 copies / mL. A negative result does not preclude SARS-CoV-2 infection and should not be used as the sole basis for treatment or other patient management decisions.  A negative result may occur with improper specimen collection / handling, submission of specimen other than nasopharyngeal swab, presence of viral mutation(s) within the areas targeted by this assay, and inadequate number of viral copies (<250 copies / mL). A negative result must be combined with clinical observations, patient history, and epidemiological information.  Fact Sheet for Patients:   BoilerBrush.com.cy  Fact Sheet for Healthcare Providers: https://pope.com/  This test is not yet approved or  cleared by the [place] FDA and has been authorized for detection and/or diagnosis of SARS-CoV-2 by FDA under an Emergency Use Authorization (EUA).  This EUA will remain in effect (meaning this test can be used) for the duration of the COVID-19 declaration under  Section 564(b)(1) of the Act, 21 U.S.C. section 360bbb-3(b)(1), unless the authorization is terminated or revoked sooner.  Performed at [facility], [address] [place], [place] [address]      Time coordinating discharge: 35 minutes  SIGNED:   Jacquelin Hawking, MD [facility] 08/23/2019, 12:31 PM

## 2019-08-23 NOTE — Discharge Instructions (Signed)
Brenda [name],  You were in the hospital because of volume depletion from a GI infection. This was managed with supportive care to keep your fluids up and to keep you hydrated. Please follow-up with your GI physician as needed.

## 2019-08-23 NOTE — Progress Notes (Signed)
Pt ambulated at room air and oxygen saturation dropped to 82%.  Pt was placed on 2L [place] and oxygen saturation went up to 92% while ambulating.   Pt is currently in bed at [facility]  With oxygen saturation 98%.   Pt spouse is at bedside.

## 2019-08-23 NOTE — Progress Notes (Signed)
Discharge and medication education given with teach back.  Iv removed and dressing applied.  Pt's spouse at bedside.  Pt's belongings with pt. :clothes, shoes and jewelry.   Home medication given.  PTAR to take pt to facility.

## 2019-08-23 NOTE — Progress Notes (Signed)
PTAR has picked up pt. Stable. PM meds given.

## 2019-08-27 DIAGNOSIS — F411 Generalized anxiety disorder: Secondary | ICD-10-CM | POA: Diagnosis not present

## 2019-08-27 DIAGNOSIS — G4700 Insomnia, unspecified: Secondary | ICD-10-CM | POA: Diagnosis not present

## 2019-08-28 DIAGNOSIS — R262 Difficulty in walking, not elsewhere classified: Secondary | ICD-10-CM | POA: Diagnosis not present

## 2019-08-28 DIAGNOSIS — R5381 Other malaise: Secondary | ICD-10-CM | POA: Diagnosis not present

## 2019-08-28 DIAGNOSIS — M25562 Pain in left knee: Secondary | ICD-10-CM | POA: Diagnosis not present

## 2019-08-30 DIAGNOSIS — I10 Essential (primary) hypertension: Secondary | ICD-10-CM | POA: Diagnosis not present

## 2019-08-30 DIAGNOSIS — R251 Tremor, unspecified: Secondary | ICD-10-CM | POA: Diagnosis not present

## 2019-08-30 DIAGNOSIS — K580 Irritable bowel syndrome with diarrhea: Secondary | ICD-10-CM | POA: Diagnosis not present

## 2019-09-04 DIAGNOSIS — R5381 Other malaise: Secondary | ICD-10-CM | POA: Diagnosis not present

## 2019-09-04 DIAGNOSIS — F411 Generalized anxiety disorder: Secondary | ICD-10-CM | POA: Diagnosis not present

## 2019-09-04 DIAGNOSIS — G4700 Insomnia, unspecified: Secondary | ICD-10-CM | POA: Diagnosis not present

## 2019-09-04 DIAGNOSIS — M25562 Pain in left knee: Secondary | ICD-10-CM | POA: Diagnosis not present

## 2019-09-04 DIAGNOSIS — R262 Difficulty in walking, not elsewhere classified: Secondary | ICD-10-CM | POA: Diagnosis not present

## 2019-09-06 DIAGNOSIS — K219 Gastro-esophageal reflux disease without esophagitis: Secondary | ICD-10-CM | POA: Diagnosis not present

## 2019-09-06 DIAGNOSIS — N179 Acute kidney failure, unspecified: Secondary | ICD-10-CM | POA: Diagnosis not present

## 2019-09-06 DIAGNOSIS — F411 Generalized anxiety disorder: Secondary | ICD-10-CM | POA: Diagnosis not present

## 2019-09-06 DIAGNOSIS — I10 Essential (primary) hypertension: Secondary | ICD-10-CM | POA: Diagnosis not present

## 2019-09-06 DIAGNOSIS — K580 Irritable bowel syndrome with diarrhea: Secondary | ICD-10-CM | POA: Diagnosis not present

## 2019-09-06 DIAGNOSIS — G250 Essential tremor: Secondary | ICD-10-CM | POA: Diagnosis not present

## 2019-09-06 DIAGNOSIS — M6281 Muscle weakness (generalized): Secondary | ICD-10-CM | POA: Diagnosis not present

## 2019-09-06 DIAGNOSIS — E785 Hyperlipidemia, unspecified: Secondary | ICD-10-CM | POA: Diagnosis not present

## 2019-09-06 DIAGNOSIS — E43 Unspecified severe protein-calorie malnutrition: Secondary | ICD-10-CM | POA: Diagnosis not present

## 2019-09-10 DIAGNOSIS — E785 Hyperlipidemia, unspecified: Secondary | ICD-10-CM | POA: Diagnosis not present

## 2019-09-10 DIAGNOSIS — E861 Hypovolemia: Secondary | ICD-10-CM | POA: Diagnosis not present

## 2019-09-10 DIAGNOSIS — K580 Irritable bowel syndrome with diarrhea: Secondary | ICD-10-CM | POA: Diagnosis not present

## 2019-09-10 DIAGNOSIS — F419 Anxiety disorder, unspecified: Secondary | ICD-10-CM | POA: Diagnosis not present

## 2019-09-10 DIAGNOSIS — K219 Gastro-esophageal reflux disease without esophagitis: Secondary | ICD-10-CM | POA: Diagnosis not present

## 2019-09-10 DIAGNOSIS — M810 Age-related osteoporosis without current pathological fracture: Secondary | ICD-10-CM | POA: Diagnosis not present

## 2019-09-10 DIAGNOSIS — I10 Essential (primary) hypertension: Secondary | ICD-10-CM | POA: Diagnosis not present

## 2019-09-10 DIAGNOSIS — K529 Noninfective gastroenteritis and colitis, unspecified: Secondary | ICD-10-CM | POA: Diagnosis not present

## 2019-09-10 DIAGNOSIS — G4700 Insomnia, unspecified: Secondary | ICD-10-CM | POA: Diagnosis not present

## 2019-09-11 DIAGNOSIS — E861 Hypovolemia: Secondary | ICD-10-CM | POA: Diagnosis not present

## 2019-09-11 DIAGNOSIS — E785 Hyperlipidemia, unspecified: Secondary | ICD-10-CM | POA: Diagnosis not present

## 2019-09-11 DIAGNOSIS — K529 Noninfective gastroenteritis and colitis, unspecified: Secondary | ICD-10-CM | POA: Diagnosis not present

## 2019-09-11 DIAGNOSIS — M810 Age-related osteoporosis without current pathological fracture: Secondary | ICD-10-CM | POA: Diagnosis not present

## 2019-09-11 DIAGNOSIS — I10 Essential (primary) hypertension: Secondary | ICD-10-CM | POA: Diagnosis not present

## 2019-09-11 DIAGNOSIS — F419 Anxiety disorder, unspecified: Secondary | ICD-10-CM | POA: Diagnosis not present

## 2019-09-11 DIAGNOSIS — K219 Gastro-esophageal reflux disease without esophagitis: Secondary | ICD-10-CM | POA: Diagnosis not present

## 2019-09-11 DIAGNOSIS — G4700 Insomnia, unspecified: Secondary | ICD-10-CM | POA: Diagnosis not present

## 2019-09-11 DIAGNOSIS — K580 Irritable bowel syndrome with diarrhea: Secondary | ICD-10-CM | POA: Diagnosis not present

## 2019-09-13 DIAGNOSIS — E785 Hyperlipidemia, unspecified: Secondary | ICD-10-CM | POA: Diagnosis not present

## 2019-09-13 DIAGNOSIS — M810 Age-related osteoporosis without current pathological fracture: Secondary | ICD-10-CM | POA: Diagnosis not present

## 2019-09-13 DIAGNOSIS — F419 Anxiety disorder, unspecified: Secondary | ICD-10-CM | POA: Diagnosis not present

## 2019-09-13 DIAGNOSIS — G4700 Insomnia, unspecified: Secondary | ICD-10-CM | POA: Diagnosis not present

## 2019-09-13 DIAGNOSIS — K529 Noninfective gastroenteritis and colitis, unspecified: Secondary | ICD-10-CM | POA: Diagnosis not present

## 2019-09-13 DIAGNOSIS — E861 Hypovolemia: Secondary | ICD-10-CM | POA: Diagnosis not present

## 2019-09-13 DIAGNOSIS — K580 Irritable bowel syndrome with diarrhea: Secondary | ICD-10-CM | POA: Diagnosis not present

## 2019-09-13 DIAGNOSIS — I10 Essential (primary) hypertension: Secondary | ICD-10-CM | POA: Diagnosis not present

## 2019-09-13 DIAGNOSIS — K219 Gastro-esophageal reflux disease without esophagitis: Secondary | ICD-10-CM | POA: Diagnosis not present

## 2019-09-14 DIAGNOSIS — I10 Essential (primary) hypertension: Secondary | ICD-10-CM | POA: Diagnosis not present

## 2019-09-14 DIAGNOSIS — E861 Hypovolemia: Secondary | ICD-10-CM | POA: Diagnosis not present

## 2019-09-14 DIAGNOSIS — K219 Gastro-esophageal reflux disease without esophagitis: Secondary | ICD-10-CM | POA: Diagnosis not present

## 2019-09-14 DIAGNOSIS — K529 Noninfective gastroenteritis and colitis, unspecified: Secondary | ICD-10-CM | POA: Diagnosis not present

## 2019-09-14 DIAGNOSIS — E785 Hyperlipidemia, unspecified: Secondary | ICD-10-CM | POA: Diagnosis not present

## 2019-09-14 DIAGNOSIS — K580 Irritable bowel syndrome with diarrhea: Secondary | ICD-10-CM | POA: Diagnosis not present

## 2019-09-14 DIAGNOSIS — F419 Anxiety disorder, unspecified: Secondary | ICD-10-CM | POA: Diagnosis not present

## 2019-09-14 DIAGNOSIS — G4700 Insomnia, unspecified: Secondary | ICD-10-CM | POA: Diagnosis not present

## 2019-09-14 DIAGNOSIS — M810 Age-related osteoporosis without current pathological fracture: Secondary | ICD-10-CM | POA: Diagnosis not present

## 2019-09-18 DIAGNOSIS — K580 Irritable bowel syndrome with diarrhea: Secondary | ICD-10-CM | POA: Diagnosis not present

## 2019-09-18 DIAGNOSIS — K219 Gastro-esophageal reflux disease without esophagitis: Secondary | ICD-10-CM | POA: Diagnosis not present

## 2019-09-18 DIAGNOSIS — E861 Hypovolemia: Secondary | ICD-10-CM | POA: Diagnosis not present

## 2019-09-18 DIAGNOSIS — G4700 Insomnia, unspecified: Secondary | ICD-10-CM | POA: Diagnosis not present

## 2019-09-18 DIAGNOSIS — F419 Anxiety disorder, unspecified: Secondary | ICD-10-CM | POA: Diagnosis not present

## 2019-09-18 DIAGNOSIS — E785 Hyperlipidemia, unspecified: Secondary | ICD-10-CM | POA: Diagnosis not present

## 2019-09-18 DIAGNOSIS — M810 Age-related osteoporosis without current pathological fracture: Secondary | ICD-10-CM | POA: Diagnosis not present

## 2019-09-18 DIAGNOSIS — I10 Essential (primary) hypertension: Secondary | ICD-10-CM | POA: Diagnosis not present

## 2019-09-18 DIAGNOSIS — K529 Noninfective gastroenteritis and colitis, unspecified: Secondary | ICD-10-CM | POA: Diagnosis not present

## 2019-09-20 DIAGNOSIS — E861 Hypovolemia: Secondary | ICD-10-CM | POA: Diagnosis not present

## 2019-09-20 DIAGNOSIS — G4700 Insomnia, unspecified: Secondary | ICD-10-CM | POA: Diagnosis not present

## 2019-09-20 DIAGNOSIS — K529 Noninfective gastroenteritis and colitis, unspecified: Secondary | ICD-10-CM | POA: Diagnosis not present

## 2019-09-20 DIAGNOSIS — M810 Age-related osteoporosis without current pathological fracture: Secondary | ICD-10-CM | POA: Diagnosis not present

## 2019-09-20 DIAGNOSIS — F419 Anxiety disorder, unspecified: Secondary | ICD-10-CM | POA: Diagnosis not present

## 2019-09-20 DIAGNOSIS — I10 Essential (primary) hypertension: Secondary | ICD-10-CM | POA: Diagnosis not present

## 2019-09-20 DIAGNOSIS — E785 Hyperlipidemia, unspecified: Secondary | ICD-10-CM | POA: Diagnosis not present

## 2019-09-20 DIAGNOSIS — K580 Irritable bowel syndrome with diarrhea: Secondary | ICD-10-CM | POA: Diagnosis not present

## 2019-09-20 DIAGNOSIS — K219 Gastro-esophageal reflux disease without esophagitis: Secondary | ICD-10-CM | POA: Diagnosis not present

## 2019-09-21 DIAGNOSIS — I10 Essential (primary) hypertension: Secondary | ICD-10-CM | POA: Diagnosis not present

## 2019-09-21 DIAGNOSIS — K219 Gastro-esophageal reflux disease without esophagitis: Secondary | ICD-10-CM | POA: Diagnosis not present

## 2019-09-21 DIAGNOSIS — E785 Hyperlipidemia, unspecified: Secondary | ICD-10-CM | POA: Diagnosis not present

## 2019-09-21 DIAGNOSIS — K529 Noninfective gastroenteritis and colitis, unspecified: Secondary | ICD-10-CM | POA: Diagnosis not present

## 2019-09-21 DIAGNOSIS — F419 Anxiety disorder, unspecified: Secondary | ICD-10-CM | POA: Diagnosis not present

## 2019-09-21 DIAGNOSIS — E861 Hypovolemia: Secondary | ICD-10-CM | POA: Diagnosis not present

## 2019-09-21 DIAGNOSIS — K580 Irritable bowel syndrome with diarrhea: Secondary | ICD-10-CM | POA: Diagnosis not present

## 2019-09-21 DIAGNOSIS — M810 Age-related osteoporosis without current pathological fracture: Secondary | ICD-10-CM | POA: Diagnosis not present

## 2019-09-21 DIAGNOSIS — G4700 Insomnia, unspecified: Secondary | ICD-10-CM | POA: Diagnosis not present

## 2019-09-25 DIAGNOSIS — K580 Irritable bowel syndrome with diarrhea: Secondary | ICD-10-CM | POA: Diagnosis not present

## 2019-09-25 DIAGNOSIS — K219 Gastro-esophageal reflux disease without esophagitis: Secondary | ICD-10-CM | POA: Diagnosis not present

## 2019-09-25 DIAGNOSIS — K529 Noninfective gastroenteritis and colitis, unspecified: Secondary | ICD-10-CM | POA: Diagnosis not present

## 2019-09-25 DIAGNOSIS — M810 Age-related osteoporosis without current pathological fracture: Secondary | ICD-10-CM | POA: Diagnosis not present

## 2019-09-25 DIAGNOSIS — E785 Hyperlipidemia, unspecified: Secondary | ICD-10-CM | POA: Diagnosis not present

## 2019-09-25 DIAGNOSIS — F419 Anxiety disorder, unspecified: Secondary | ICD-10-CM | POA: Diagnosis not present

## 2019-09-25 DIAGNOSIS — G4700 Insomnia, unspecified: Secondary | ICD-10-CM | POA: Diagnosis not present

## 2019-09-25 DIAGNOSIS — I10 Essential (primary) hypertension: Secondary | ICD-10-CM | POA: Diagnosis not present

## 2019-09-25 DIAGNOSIS — E861 Hypovolemia: Secondary | ICD-10-CM | POA: Diagnosis not present

## 2019-09-26 DIAGNOSIS — M810 Age-related osteoporosis without current pathological fracture: Secondary | ICD-10-CM | POA: Diagnosis not present

## 2019-09-26 DIAGNOSIS — E861 Hypovolemia: Secondary | ICD-10-CM | POA: Diagnosis not present

## 2019-09-26 DIAGNOSIS — I10 Essential (primary) hypertension: Secondary | ICD-10-CM | POA: Diagnosis not present

## 2019-09-26 DIAGNOSIS — K529 Noninfective gastroenteritis and colitis, unspecified: Secondary | ICD-10-CM | POA: Diagnosis not present

## 2019-09-26 DIAGNOSIS — F419 Anxiety disorder, unspecified: Secondary | ICD-10-CM | POA: Diagnosis not present

## 2019-09-26 DIAGNOSIS — E785 Hyperlipidemia, unspecified: Secondary | ICD-10-CM | POA: Diagnosis not present

## 2019-09-26 DIAGNOSIS — K580 Irritable bowel syndrome with diarrhea: Secondary | ICD-10-CM | POA: Diagnosis not present

## 2019-09-26 DIAGNOSIS — G4700 Insomnia, unspecified: Secondary | ICD-10-CM | POA: Diagnosis not present

## 2019-09-26 DIAGNOSIS — K219 Gastro-esophageal reflux disease without esophagitis: Secondary | ICD-10-CM | POA: Diagnosis not present

## 2019-09-27 DIAGNOSIS — E861 Hypovolemia: Secondary | ICD-10-CM | POA: Diagnosis not present

## 2019-09-27 DIAGNOSIS — G4700 Insomnia, unspecified: Secondary | ICD-10-CM | POA: Diagnosis not present

## 2019-09-27 DIAGNOSIS — K529 Noninfective gastroenteritis and colitis, unspecified: Secondary | ICD-10-CM | POA: Diagnosis not present

## 2019-09-27 DIAGNOSIS — I10 Essential (primary) hypertension: Secondary | ICD-10-CM | POA: Diagnosis not present

## 2019-09-27 DIAGNOSIS — E785 Hyperlipidemia, unspecified: Secondary | ICD-10-CM | POA: Diagnosis not present

## 2019-09-27 DIAGNOSIS — M810 Age-related osteoporosis without current pathological fracture: Secondary | ICD-10-CM | POA: Diagnosis not present

## 2019-09-27 DIAGNOSIS — K580 Irritable bowel syndrome with diarrhea: Secondary | ICD-10-CM | POA: Diagnosis not present

## 2019-09-27 DIAGNOSIS — F419 Anxiety disorder, unspecified: Secondary | ICD-10-CM | POA: Diagnosis not present

## 2019-09-27 DIAGNOSIS — K219 Gastro-esophageal reflux disease without esophagitis: Secondary | ICD-10-CM | POA: Diagnosis not present

## 2019-10-01 DIAGNOSIS — M810 Age-related osteoporosis without current pathological fracture: Secondary | ICD-10-CM | POA: Diagnosis not present

## 2019-10-01 DIAGNOSIS — K580 Irritable bowel syndrome with diarrhea: Secondary | ICD-10-CM | POA: Diagnosis not present

## 2019-10-01 DIAGNOSIS — G4700 Insomnia, unspecified: Secondary | ICD-10-CM | POA: Diagnosis not present

## 2019-10-01 DIAGNOSIS — E785 Hyperlipidemia, unspecified: Secondary | ICD-10-CM | POA: Diagnosis not present

## 2019-10-01 DIAGNOSIS — F419 Anxiety disorder, unspecified: Secondary | ICD-10-CM | POA: Diagnosis not present

## 2019-10-01 DIAGNOSIS — K219 Gastro-esophageal reflux disease without esophagitis: Secondary | ICD-10-CM | POA: Diagnosis not present

## 2019-10-01 DIAGNOSIS — K529 Noninfective gastroenteritis and colitis, unspecified: Secondary | ICD-10-CM | POA: Diagnosis not present

## 2019-10-01 DIAGNOSIS — I10 Essential (primary) hypertension: Secondary | ICD-10-CM | POA: Diagnosis not present

## 2019-10-01 DIAGNOSIS — E861 Hypovolemia: Secondary | ICD-10-CM | POA: Diagnosis not present

## 2019-10-03 DIAGNOSIS — Z1231 Encounter for screening mammogram for malignant neoplasm of breast: Secondary | ICD-10-CM | POA: Diagnosis not present

## 2019-10-04 DIAGNOSIS — M810 Age-related osteoporosis without current pathological fracture: Secondary | ICD-10-CM | POA: Diagnosis not present

## 2019-10-04 DIAGNOSIS — R251 Tremor, unspecified: Secondary | ICD-10-CM | POA: Diagnosis not present

## 2019-10-04 DIAGNOSIS — R609 Edema, unspecified: Secondary | ICD-10-CM | POA: Diagnosis not present

## 2019-10-04 DIAGNOSIS — I10 Essential (primary) hypertension: Secondary | ICD-10-CM | POA: Diagnosis not present

## 2019-10-04 DIAGNOSIS — R519 Headache, unspecified: Secondary | ICD-10-CM | POA: Diagnosis not present

## 2019-10-04 DIAGNOSIS — F419 Anxiety disorder, unspecified: Secondary | ICD-10-CM | POA: Diagnosis not present

## 2019-10-04 DIAGNOSIS — S51819A Laceration without foreign body of unspecified forearm, initial encounter: Secondary | ICD-10-CM | POA: Diagnosis not present

## 2019-10-04 DIAGNOSIS — E7800 Pure hypercholesterolemia, unspecified: Secondary | ICD-10-CM | POA: Diagnosis not present

## 2019-10-04 DIAGNOSIS — R197 Diarrhea, unspecified: Secondary | ICD-10-CM | POA: Diagnosis not present

## 2019-10-05 DIAGNOSIS — E861 Hypovolemia: Secondary | ICD-10-CM | POA: Diagnosis not present

## 2019-10-05 DIAGNOSIS — G4700 Insomnia, unspecified: Secondary | ICD-10-CM | POA: Diagnosis not present

## 2019-10-05 DIAGNOSIS — I10 Essential (primary) hypertension: Secondary | ICD-10-CM | POA: Diagnosis not present

## 2019-10-05 DIAGNOSIS — E785 Hyperlipidemia, unspecified: Secondary | ICD-10-CM | POA: Diagnosis not present

## 2019-10-05 DIAGNOSIS — F419 Anxiety disorder, unspecified: Secondary | ICD-10-CM | POA: Diagnosis not present

## 2019-10-05 DIAGNOSIS — M810 Age-related osteoporosis without current pathological fracture: Secondary | ICD-10-CM | POA: Diagnosis not present

## 2019-10-05 DIAGNOSIS — K219 Gastro-esophageal reflux disease without esophagitis: Secondary | ICD-10-CM | POA: Diagnosis not present

## 2019-10-05 DIAGNOSIS — K580 Irritable bowel syndrome with diarrhea: Secondary | ICD-10-CM | POA: Diagnosis not present

## 2019-10-05 DIAGNOSIS — K529 Noninfective gastroenteritis and colitis, unspecified: Secondary | ICD-10-CM | POA: Diagnosis not present

## 2020-04-03 DIAGNOSIS — E7800 Pure hypercholesterolemia, unspecified: Secondary | ICD-10-CM | POA: Diagnosis not present

## 2020-04-03 DIAGNOSIS — R251 Tremor, unspecified: Secondary | ICD-10-CM | POA: Diagnosis not present

## 2020-04-03 DIAGNOSIS — Z0000 Encounter for general adult medical examination without abnormal findings: Secondary | ICD-10-CM | POA: Diagnosis not present

## 2020-04-03 DIAGNOSIS — Z23 Encounter for immunization: Secondary | ICD-10-CM | POA: Diagnosis not present

## 2020-04-03 DIAGNOSIS — F419 Anxiety disorder, unspecified: Secondary | ICD-10-CM | POA: Diagnosis not present

## 2020-04-03 DIAGNOSIS — M810 Age-related osteoporosis without current pathological fracture: Secondary | ICD-10-CM | POA: Diagnosis not present

## 2020-04-03 DIAGNOSIS — R2689 Other abnormalities of gait and mobility: Secondary | ICD-10-CM | POA: Diagnosis not present

## 2020-04-03 DIAGNOSIS — I10 Essential (primary) hypertension: Secondary | ICD-10-CM | POA: Diagnosis not present

## 2020-06-04 ENCOUNTER — Other Ambulatory Visit: Payer: Self-pay | Admitting: Adult Health

## 2020-06-04 DIAGNOSIS — G250 Essential tremor: Secondary | ICD-10-CM

## 2020-07-07 ENCOUNTER — Encounter: Payer: Self-pay | Admitting: Adult Health

## 2020-07-07 ENCOUNTER — Ambulatory Visit: Payer: Medicare PPO | Admitting: Adult Health

## 2020-07-14 ENCOUNTER — Encounter: Payer: Self-pay | Admitting: Adult Health

## 2020-07-14 ENCOUNTER — Ambulatory Visit: Payer: Medicare PPO | Admitting: Adult Health

## 2020-09-18 DIAGNOSIS — K5289 Other specified noninfective gastroenteritis and colitis: Secondary | ICD-10-CM | POA: Diagnosis not present

## 2020-10-01 DIAGNOSIS — G43009 Migraine without aura, not intractable, without status migrainosus: Secondary | ICD-10-CM | POA: Diagnosis not present

## 2020-10-07 DIAGNOSIS — I10 Essential (primary) hypertension: Secondary | ICD-10-CM | POA: Diagnosis not present

## 2020-10-07 DIAGNOSIS — R251 Tremor, unspecified: Secondary | ICD-10-CM | POA: Diagnosis not present

## 2020-10-07 DIAGNOSIS — R609 Edema, unspecified: Secondary | ICD-10-CM | POA: Diagnosis not present

## 2020-10-07 DIAGNOSIS — Z23 Encounter for immunization: Secondary | ICD-10-CM | POA: Diagnosis not present

## 2020-10-07 DIAGNOSIS — R519 Headache, unspecified: Secondary | ICD-10-CM | POA: Diagnosis not present

## 2020-10-07 DIAGNOSIS — F419 Anxiety disorder, unspecified: Secondary | ICD-10-CM | POA: Diagnosis not present

## 2020-10-07 DIAGNOSIS — R197 Diarrhea, unspecified: Secondary | ICD-10-CM | POA: Diagnosis not present

## 2020-10-07 DIAGNOSIS — E7800 Pure hypercholesterolemia, unspecified: Secondary | ICD-10-CM | POA: Diagnosis not present

## 2020-10-07 DIAGNOSIS — M810 Age-related osteoporosis without current pathological fracture: Secondary | ICD-10-CM | POA: Diagnosis not present

## 2020-12-06 IMAGING — DX DG CHEST 1V PORT
1 series · 1 of 1 positions shown · non-contrast
Comparison: Chest radiograph dated 08/14/2019.

CLINICAL DATA: 78-year-old female with shortness of breath.

EXAM:
PORTABLE CHEST 1 VIEW

[chest ap]
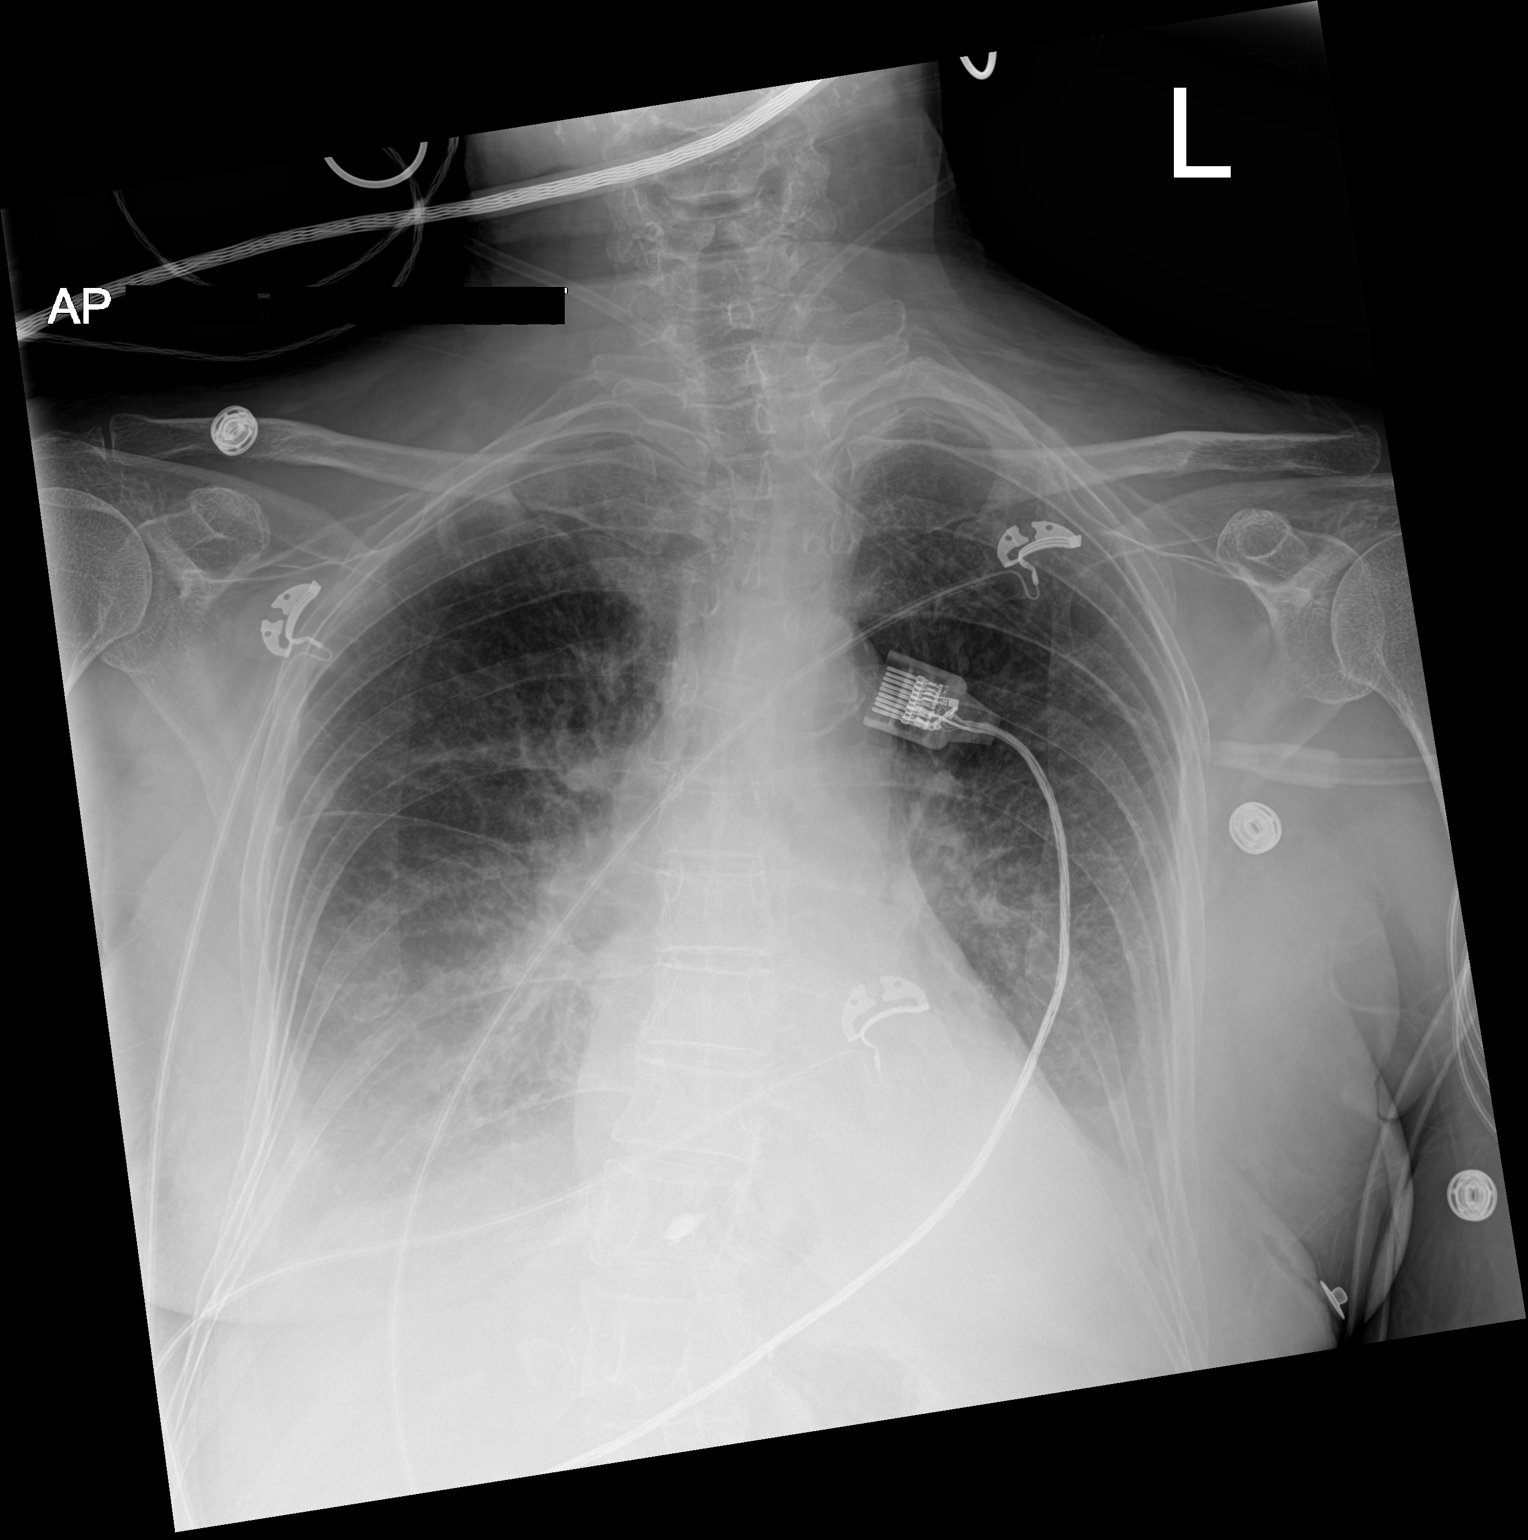

[1 of 1 positions shown; findings below may reference images not displayed]

FINDINGS: Small to moderate bilateral pleural effusions with associated
bibasilar atelectasis. Pneumonia is not excluded clinical
correlation is recommended. There is mild cardiomegaly with mild
vascular congestion. No pneumothorax. Atherosclerotic calcification
of the aorta. No acute osseous pathology.
IMPRESSION: 1. Cardiomegaly with mild vascular congestion and small to moderate
bilateral pleural effusions.
2. Bibasilar atelectasis. Pneumonia is not excluded. Overall no
significant interval change since the prior radiograph.

## 2020-12-25 ENCOUNTER — Ambulatory Visit: Payer: Medicare PPO | Admitting: Adult Health

## 2020-12-25 ENCOUNTER — Telehealth: Payer: Self-pay | Admitting: *Deleted

## 2020-12-25 NOTE — Telephone Encounter (Signed)
Pt no-showed today. This is unfortunately her third no-show in the past year.

## 2020-12-29 ENCOUNTER — Encounter: Payer: Self-pay | Admitting: Adult Health

## 2021-02-05 ENCOUNTER — Encounter: Payer: Self-pay | Admitting: Neurology

## 2021-02-13 NOTE — Progress Notes (Signed)
Assessment/Plan:   1.  Essential Tremor.  -This is evidenced by the symmetrical nature and longstanding hx of gradually getting worse.  We discussed nature and pathophysiology.  We discussed that this can continue to gradually get worse with time.  We discussed that some medications can worsen this, as can caffeine use.  We discussed medication therapy as well as surgical therapy.  She has been on topiramate with side effects.  She has been on primidone at higher dosages and told that her primidone level was too elevated (was side effect free).  She had a consultation years ago in 2017 with Dr. Rubin Payor and patient opted not to pursue DBS therapy.    -After discussion, she decided to continue with the primidone, 100 mg 3 times per day.  I would not recommend we go higher than this.  -Long discussion with the patient regarding the complex effect that alcohol can have on tremor.  Chronic alcohol use can produce a tremor but discontinuation of the alcohol can also cause a tremulous state for quite some time.  We talked about the importance of weaning alcohol under medical supervision.     2.  Memory change  -Suspect that there may be some neurodegenerative process..  -Check MRI brain, especially given the fact that she has had some falls and hit head.  I suspect we will see chronic small vessel disease as well as atrophy.  Just wanted sure we are not missing other things.  -Check B12, folate, RPR, SPEP/UPEP with immunofixation.  -Patient and husband are getting ready to move to [facility] in April.  I think that is a great idea.  3..  Chronic diarrhea  -discussed with patient that this is far out of my field of expertise.  She will need to follow-up with her gastroenterologist.  She has seen Dr. Dulce Sellar fairly recently and will need to follow-up with him.  4.  Gait instability/falls  -Suspect that this may be due to peripheral neuropathy.  Wonder if alcohol plays a role here.  -We will check labs,  as above.  -Told her that she is high risk for breaking her hip if she falls, especially given her small body habitus.  Would like to see her use a walker at all times.  Husband states that they have 2 in the home, but they are just not used.  Like to see her using those.  -We will send her for physical therapy.  5.  Follow-up 6 to 9 months, sooner should new neurologic issues arise.  Subjective:   Brenda [name] was seen in consultation in the movement disorder clinic at the request of Clovis Riley, L.August Saucer, MD.  The evaluation is for tremor.  Pt with husband who supplements the history.  Patient previously seen by [facility] neurology.  Patient last seen June, 2021.  Patient seen by primarily the nurse practitioner, but also seen by Dr. Frances Furbish.  She was also previously seen at [facility] by movement disorders, both Dr. Rubin Payor and Dr. Tempie Donning, for a DBS evaluation in 2017 and deemed to be a good candidate, but the patient opted not to pursue that because she was apparently having some GI issues at the time of consultation (does have chronic diarrhea, and was actually admitted for such in August, 2021).  Pt states today that she was worried about stroke like SE.    Tremor started when she was in college but got worse over the years, progressing to involve both hands, legs and  head.  She is L hand dominant but tremor is equal in both hands.  Tremor is most noticeable when doing tedious work with fine motor coordination.  "You don't give her a full cup of coffee" per husband.   There is a family hx of tremor in father, paternal cousins, 2 sisters and 1 brother.    Affected by caffeine:  Yes.   (Doesn't drink caffeine any more) Affected by alcohol:  Yes.   (Improves tremor - drinks 6 oz wine and husband states that if she is having a "rough time" she will go through a "gallon and a half" of bourbon per week) Affected by stress:  Yes.   Affected by fatigue:  No. Spills soup if on spoon:   Yes.   Spills glass of liquid if full:  Yes.   Affects ADL's (tying shoes, brushing teeth, etc):  Yes.    Current/Previously tried tremor medications: Topiramate (cognitive change, weight loss, taste aversion); at one time she was up to primidone, 50 mg, 4 tablets in the morning, 2 tablets in the afternoon, 4 tablets at bedtime, but is currently on primidone, 50 mg, 2 tablets 3 times daily as she was told that her primidone level was too elevated at higher dosages; was on xanax (not for tremor) but per PDMP last filled 10/07/20 - she asks about me filling that today  States that she is here more for her diarrhea than her tremor and wants to talk about that.  Patient's husband reports significant gait instability.  Reports multiple falls over the years.  Has a walker, but does not use it.  This is part of the reason that they are moving to [facility].  Indicates some memory trouble, especially upon first awakening.    Allergies  Allergen Reactions   Diclofenac Other (See Comments)    Stomach burning   Macrodantin [Nitrofurantoin Macrocrystal] Itching, Nausea And Vomiting, Swelling and Rash    Tongue swelling   Tramadol Other (See Comments)    Stomach burning   Codeine Nausea And Vomiting   Topamax [Topiramate] Other (See Comments)    Loss of taste, fatigue    Current Meds  Medication Sig   Apoaequorin (PREVAGEN PO) Take 1 capsule by mouth daily.   atorvastatin (LIPITOR) 20 MG tablet Take 20 mg by mouth every Monday, Wednesday, and Friday.    cholestyramine (QUESTRAN) 4 g packet Take 1 packet by mouth daily.   Eluxadoline (VIBERZI) 100 MG TABS Take 100 mg by mouth 2 (two) times daily.    famotidine (PEPCID) 40 MG tablet Take 40 mg by mouth daily.   Multiple Vitamins-Minerals (PRESERVISION AREDS 2) CAPS Take 1 capsule by mouth daily.   Nutritional Supplements (,FEEDING SUPPLEMENT, PROSOURCE PLUS) liquid Take 30 mLs by mouth 3 (three) times daily between meals.   primidone (MYSOLINE) 50  MG tablet Take 2 tablets (100 mg total) by mouth 3 (three) times daily. Must keep follow up 07/07/2020 for ongoing refills   promethazine (PHENERGAN) 25 MG tablet Take 25 mg by mouth 5 (five) times daily as needed for nausea or vomiting.    Vitamin D, Ergocalciferol, (DRISDOL) 1.25 MG (50000 UNIT) CAPS capsule Take 50,000 Units by mouth every 7 (seven) days.      Objective:   VITALS:   Vitals:   02/16/21 0939  BP: 110/62  Pulse: 73  SpO2: 95%  Weight: 99 lb 12.8 oz (45.3 kg)  Height: 5\' 3"  (1.6 m)   Gen:  Appears stated age and in NAD.  HEENT:  Normocephalic, atraumatic. The mucous membranes are moist. The superficial temporal arteries are without ropiness or tenderness. Cardiovascular: Regular rate and rhythm. Lungs: Clear to auscultation bilaterally. Neck: There are no carotid bruits noted bilaterally.  NEUROLOGICAL:  Orientation:  The patient is alert and oriented x 3.  She looks to her husband for finer aspects of the history.  Gets confused between various physicians she is referring to. Cranial nerves: There is good facial symmetry. Extraocular muscles are intact and visual fields are full to confrontational testing. Speech is fluent and clear. Soft palate rises symmetrically and there is no tongue deviation. Hearing is intact to conversational tone. Tone: Tone is good throughout. Sensation: Sensation is intact to light touch touch throughout (facial, trunk, extremities). Vibration is absent at the bilateral big toe but decreased at the ankle. There is no extinction with double simultaneous stimulation. There is no sensory dermatomal level identified. Coordination:  The patient has no dysdiadichokinesia or dysmetria.  She has no decremation with rapid alternating movements. Motor: Strength is 5/5 in the bilateral upper and lower extremities.  Shoulder shrug is equal bilaterally.  There is no pronator drift.  There are no fasciculations noted. DTR's: Deep tendon reflexes are 1/4  at the bilateral biceps, triceps, brachioradialis, 0/4 at the bilateral patella and achilles.  Plantar responses are downgoing bilaterally. Gait and Station: The patient is wide based.  She has normal arm swing.  She is not particularly steady and actually nearly misses the chair when she comes to sit down, but is able to catch herself and lands in the chair.  MOVEMENT EXAM: Tremor:  There is mild degree of rest tremor bilaterally.  There is mild postural and intention tremor.  This becomes more severe with Archimedes spirals, left greater than right.  I have reviewed and interpreted the following labs independently   Chemistry      Component Value Date/Time   NA 135 08/22/2019 0445   K 3.4 (L) 08/22/2019 0445   CL 103 08/22/2019 0445   CO2 22 08/22/2019 0445   BUN 5 (L) 08/22/2019 0445   CREATININE 0.87 08/22/2019 0445      Component Value Date/Time   CALCIUM 7.6 (L) 08/22/2019 0445   ALKPHOS 72 08/19/2019 0515   AST 23 08/19/2019 0515   ALT 19 08/19/2019 0515   BILITOT 0.6 08/19/2019 0515      Lab Results  Component Value Date   WBC 4.1 08/22/2019   HGB 11.0 (L) 08/22/2019   HCT 33.6 (L) 08/22/2019   MCV 99.4 08/22/2019   PLT 183 08/22/2019   No results found for: TSH   No results found for: VITAMINB12  Total time spent on today's visit was 65 minutes, including both face-to-face time and nonface-to-face time.  Time included that spent on review of records (prior notes available to me/labs/imaging if pertinent), discussing treatment and goals, answering patient's questions and coordinating care.  CC:  Mitchell, L.August Saucerean, MD

## 2021-02-16 ENCOUNTER — Other Ambulatory Visit: Payer: Self-pay

## 2021-02-16 ENCOUNTER — Ambulatory Visit: Payer: Medicare PPO | Admitting: Neurology

## 2021-02-16 ENCOUNTER — Other Ambulatory Visit (INDEPENDENT_AMBULATORY_CARE_PROVIDER_SITE_OTHER): Payer: Medicare PPO

## 2021-02-16 ENCOUNTER — Encounter: Payer: Self-pay | Admitting: Neurology

## 2021-02-16 VITALS — BP 110/62 | HR 73 | Ht 63.0 in | Wt 99.8 lb

## 2021-02-16 DIAGNOSIS — R296 Repeated falls: Secondary | ICD-10-CM

## 2021-02-16 DIAGNOSIS — D649 Anemia, unspecified: Secondary | ICD-10-CM | POA: Diagnosis not present

## 2021-02-16 DIAGNOSIS — Z5181 Encounter for therapeutic drug level monitoring: Secondary | ICD-10-CM

## 2021-02-16 DIAGNOSIS — R6889 Other general symptoms and signs: Secondary | ICD-10-CM

## 2021-02-16 DIAGNOSIS — Z113 Encounter for screening for infections with a predominantly sexual mode of transmission: Secondary | ICD-10-CM | POA: Diagnosis not present

## 2021-02-16 DIAGNOSIS — R413 Other amnesia: Secondary | ICD-10-CM | POA: Diagnosis not present

## 2021-02-16 DIAGNOSIS — K529 Noninfective gastroenteritis and colitis, unspecified: Secondary | ICD-10-CM | POA: Diagnosis not present

## 2021-02-16 DIAGNOSIS — G250 Essential tremor: Secondary | ICD-10-CM | POA: Diagnosis not present

## 2021-02-16 LAB — B12 AND FOLATE PANEL
Folate: 7 ng/mL (ref >5.9)
Vitamin B-12: 250 pg/mL (ref 211–911)

## 2021-02-16 NOTE — Patient Instructions (Signed)
A referral to  Imaging has been placed for your MRI someone will contact you directly to schedule your appt. They are located at [address] Please contact them directly by calling [phone] with any questions regarding your referral.  Your provider has requested that you have labwork completed today. The lab is located on the Second floor at [address], within the [facility] office. When you get off the elevator, turn right and go in the [facility] [address]; the first brown door on the left.  Tell the ladies behind the desk that you are there for lab work. If you are not called within 15 minutes please check with the front desk.   Once you complete your labs you are free to go. You will receive a call or message via MyChart with your lab results.        

## 2021-02-18 LAB — IMMUNOFIXATION ELECTROPHORESIS
IgG (Immunoglobin G), Serum: 1184 mg/dL (ref 600–1540)
IgM, Serum: 297 mg/dL (ref 50–300)
Immunofix Electr Int: NOT DETECTED
Immunoglobulin A: 316 mg/dL (ref 70–320)

## 2021-02-18 LAB — PROTEIN ELECTROPHORESIS, SERUM
Albumin ELP: 3.5 g/dL — ABNORMAL LOW (ref 3.8–4.8)
Alpha 1: 0.3 g/dL (ref 0.2–0.3)
Alpha 2: 0.8 g/dL (ref 0.5–0.9)
Beta 2: 0.4 g/dL (ref 0.2–0.5)
Beta Globulin: 0.4 g/dL (ref 0.4–0.6)
Gamma Globulin: 1.2 g/dL (ref 0.8–1.7)
Total Protein: 6.6 g/dL (ref 6.1–8.1)

## 2021-02-19 LAB — VITAMIN B1: Vitamin B1 (Thiamine): 6 nmol/L — ABNORMAL LOW (ref 8–30)

## 2021-02-19 LAB — RPR: RPR Ser Ql: NONREACTIVE

## 2021-02-23 DIAGNOSIS — Z1231 Encounter for screening mammogram for malignant neoplasm of breast: Secondary | ICD-10-CM | POA: Diagnosis not present

## 2021-02-24 ENCOUNTER — Encounter: Payer: Self-pay | Admitting: Neurology

## 2021-03-06 ENCOUNTER — Ambulatory Visit
Admission: RE | Admit: 2021-03-06 | Discharge: 2021-03-06 | Disposition: A | Discharge status: Home / Self Care | Payer: Medicare PPO | Source: Ambulatory Visit | Attending: Neurology | Admitting: Neurology

## 2021-03-06 ENCOUNTER — Other Ambulatory Visit: Payer: Self-pay

## 2021-03-06 DIAGNOSIS — S0990XA Unspecified injury of head, initial encounter: Secondary | ICD-10-CM | POA: Diagnosis not present

## 2021-03-06 DIAGNOSIS — R413 Other amnesia: Secondary | ICD-10-CM

## 2021-03-06 DIAGNOSIS — R296 Repeated falls: Secondary | ICD-10-CM | POA: Diagnosis not present

## 2021-03-06 DIAGNOSIS — R6889 Other general symptoms and signs: Secondary | ICD-10-CM

## 2021-03-06 DIAGNOSIS — Z5181 Encounter for therapeutic drug level monitoring: Secondary | ICD-10-CM

## 2021-03-06 DIAGNOSIS — D649 Anemia, unspecified: Secondary | ICD-10-CM

## 2021-03-08 ENCOUNTER — Telehealth: Payer: Self-pay | Admitting: Neurology

## 2021-03-08 NOTE — Telephone Encounter (Signed)
Looked at MRI brain and agree with Dr. Delice Lesch.  Staff awaiting call back from pt re: we want to order maxillofacial CT

## 2021-03-09 DIAGNOSIS — K52831 Collagenous colitis: Secondary | ICD-10-CM | POA: Diagnosis not present

## 2021-03-09 NOTE — Telephone Encounter (Signed)
Called patient  and left message I have reached out a number of times to please call office  to speak with me

## 2021-03-10 NOTE — Telephone Encounter (Signed)
Called patient about labs and MRI and left message again

## 2021-03-11 ENCOUNTER — Telehealth: Payer: Self-pay

## 2021-03-11 NOTE — Telephone Encounter (Signed)
I have been trying to reach patient on a number of times. Unable to reach her after numerous messages   I have mailed a letter with labs and MRI results and also sent a MY Chart message

## 2021-03-11 NOTE — Telephone Encounter (Signed)
I have reached out to patient on a number of times around 7-8 times and have been unable to reach. I have mailed letter to the patient as well as sent a message on the patients My chart

## 2021-04-07 DIAGNOSIS — Z23 Encounter for immunization: Secondary | ICD-10-CM | POA: Diagnosis not present

## 2021-04-07 DIAGNOSIS — Z0000 Encounter for general adult medical examination without abnormal findings: Secondary | ICD-10-CM | POA: Diagnosis not present

## 2021-04-07 DIAGNOSIS — I10 Essential (primary) hypertension: Secondary | ICD-10-CM | POA: Diagnosis not present

## 2021-04-07 DIAGNOSIS — E7800 Pure hypercholesterolemia, unspecified: Secondary | ICD-10-CM | POA: Diagnosis not present

## 2021-04-07 DIAGNOSIS — F419 Anxiety disorder, unspecified: Secondary | ICD-10-CM | POA: Diagnosis not present

## 2021-04-07 DIAGNOSIS — I700 Atherosclerosis of aorta: Secondary | ICD-10-CM | POA: Diagnosis not present

## 2021-04-07 DIAGNOSIS — E46 Unspecified protein-calorie malnutrition: Secondary | ICD-10-CM | POA: Diagnosis not present

## 2021-04-07 DIAGNOSIS — M810 Age-related osteoporosis without current pathological fracture: Secondary | ICD-10-CM | POA: Diagnosis not present

## 2021-04-07 DIAGNOSIS — R251 Tremor, unspecified: Secondary | ICD-10-CM | POA: Diagnosis not present

## 2021-04-07 DIAGNOSIS — R2689 Other abnormalities of gait and mobility: Secondary | ICD-10-CM | POA: Diagnosis not present

## 2021-06-16 DIAGNOSIS — K52831 Collagenous colitis: Secondary | ICD-10-CM | POA: Diagnosis not present

## 2021-06-16 DIAGNOSIS — K529 Noninfective gastroenteritis and colitis, unspecified: Secondary | ICD-10-CM | POA: Diagnosis not present

## 2021-06-16 DIAGNOSIS — R000 Tachycardia, unspecified: Secondary | ICD-10-CM | POA: Diagnosis not present

## 2021-06-26 ENCOUNTER — Encounter: Payer: Self-pay | Admitting: Neurology

## 2021-07-20 ENCOUNTER — Other Ambulatory Visit: Payer: Self-pay | Admitting: Adult Health

## 2021-07-20 DIAGNOSIS — G250 Essential tremor: Secondary | ICD-10-CM

## 2021-08-19 ENCOUNTER — Other Ambulatory Visit: Payer: Self-pay | Admitting: *Deleted

## 2021-08-19 NOTE — Patient Outreach (Signed)
  Care Coordination   Initial Visit Note   08/19/2021 Name: Brenda [name] MRN: [id number] DOB: [birth year]  Brenda [name] is a 81 y.o. year old female who sees Clinton, L.August Saucer, MD for primary care. I spoke with  Rico Sheehan by phone today  What matters to the patients health and wellness today?  Patient concern now is that the movers are there moving her and her spouse to [facility] [place]. She is unable to talk with them there. She stated she doesn't know what services she may need until she gets there. She would like a call back in few months.     Goals Addressed   None     SDOH assessments and interventions completed:  Yes     Care Coordination Interventions Activated:  Yes  Care Coordination Interventions:  Yes, provided   Follow up plan: Follow up call scheduled for 2 months   Oct 11,2023 11 am  Encounter Outcome:  Pt. Request to Call Back  Gean Maidens BSN RN [facility] [phone]

## 2021-09-29 NOTE — Progress Notes (Deleted)
Assessment/Plan:    1.  Essential Tremor              -This is evidenced by the symmetrical nature and longstanding hx of gradually getting worse.  We discussed nature and pathophysiology.  We discussed that this can continue to gradually get worse with time.  We discussed that some medications can worsen this, as can caffeine use.  We discussed medication therapy as well as surgical therapy.  She has been on topiramate with side effects.  She has been on primidone at higher dosages and told that her primidone level was too elevated (was side effect free).  She had a consultation years ago in 2017 with Dr. Linus Mako and patient opted not to pursue DBS therapy.               -After discussion, she decided to continue with the primidone, 100 mg 3 times per day.  I would not recommend we go higher than this.             -Long discussion with the patient regarding the complex effect that alcohol can have on tremor.  Chronic alcohol use can produce a tremor but discontinuation of the alcohol can also cause a tremulous state for quite some time.  We talked about the importance of weaning alcohol under medical supervision.       2.  Memory change             -Suspect that there may be some neurodegenerative process..             -Check MRI brain, especially given the fact that she has had some falls and hit head.  I suspect we will see chronic small vessel disease as well as atrophy.  Just wanted sure we are not missing other things.             -Check B12, folate, RPR, SPEP/UPEP with immunofixation.             -Patient and husband are getting ready to move to [facility] in April.  I think that is a great idea.   3..  Chronic diarrhea             -discussed with patient that this is far out of my field of expertise. she sees GI, Dr. Paulita Fujita and will need to continue to follow there.   4.  Gait instability/falls             -Suspect that this may be due to peripheral neuropathy.  Wonder if alcohol  plays a role here.             -Told her that she is high risk for breaking her hip if she falls, especially given her small body habitus.  Would like to see her use a walker at all times.  Husband states that they have 2 in the home, but they are just not used.  Like to see her using those.             -We will send her for physical therapy.   5.  B12 deficiency  -***On oral supplementation  6.  B1 deficiency  -Oral thiamine, 100 mg daily recommended  7.  Possible maxillofacial fracture, incidental finding on MRI brain in February, 2023  -We tried to call patient/husband multiple times in February in order to get further CT imaging.  Unfortunately, calls were not returned.  At this point, there is likely nothing further to do.  Subjective:   Brenda [name] was seen today in follow up for essential tremor.  My previous records were reviewed prior to todays visit. Pt denies falls.  Pt denies lightheadedness, near syncope.  They have moved to [facility] since last visit.  Last visit, we checked labs and her B12 level was low.  We called them multiple times and sent MyChart messages about it, so that she would take a B12 supplement.  Her B1 level was also low and it was recommended she take thiamine, 100 mg daily.  MRI brain was completed since last visit.  Intracranially, it was nonacute.  There was advanced small vessel disease and evidence of old, chronic infarcts.  Radiology indicated that there was an indeterminate deformity of the posterior lateral wall of the right maxillary sinus, with possible concern for recent maxillofacial fracture.  As with other attempts to contact them to try to do a maxillofacial CT, we were unable to reach the patient or husband.  We called them several times.  Current prescribed movement disorder medications: ***Primidone, 100 mg 3 times per day (she was told that primidone level was too elevated at higher dosages, without side effects?)  Current/Previously tried  tremor medications: Topiramate (cognitive change, weight loss, taste aversion); at one time she was up to primidone, 50 mg, 4 tablets in the morning, 2 tablets in the afternoon, 4 tablets at bedtime, but is currently on primidone, 50 mg, 2 tablets 3 times daily as she was told that her primidone level was too elevated at higher dosages; was on xanax (not for tremor)   ALLERGIES:   Allergies  Allergen Reactions   Diclofenac Other (See Comments)    Stomach burning   Macrodantin [Nitrofurantoin Macrocrystal] Itching, Nausea And Vomiting, Swelling and Rash    Tongue swelling   Tramadol Other (See Comments)    Stomach burning   Codeine Nausea And Vomiting   Topamax [Topiramate] Other (See Comments)    Loss of taste, fatigue    CURRENT MEDICATIONS:  No outpatient medications have been marked as taking for the 10/01/21 encounter (Appointment) with Taishaun Levels, Octaviano Batty, DO.      Objective:    PHYSICAL EXAMINATION:    VITALS:  There were no vitals filed for this visit.  GEN:  The patient appears stated age and is in NAD. HEENT:  Normocephalic, atraumatic.  The mucous membranes are moist. The superficial temporal arteries are without ropiness or tenderness. CV:  RRR Lungs:  CTAB Neck/HEME:  There are no carotid bruits bilaterally.  Neurological examination:  Orientation: The patient is alert and oriented x3. Cranial nerves: There is good facial symmetry. The speech is fluent and clear. Soft palate rises symmetrically and there is no tongue deviation. Hearing is intact to conversational tone. Sensation: Sensation is intact to light touch throughout Motor: Strength is at least antigravity x4.  Movement examination: Tone: There is normal tone in the UE/LE Abnormal movements: *** There is mild degree of rest tremor bilaterally.  There is mild postural and intention tremor.  This becomes more severe with Archimedes spirals, left greater than right. Coordination:  There is *** decremation with  RAM's, *** Gait and Station: The patient has *** difficulty arising out of a deep-seated chair without the use of the hands. The patient's stride length is good I have reviewed and interpreted the following labs independently   Chemistry      Component Value Date/Time   NA 135 08/22/2019 0445   K 3.4 (L) 08/22/2019 0445  CL 103 08/22/2019 0445   CO2 22 08/22/2019 0445   BUN 5 (L) 08/22/2019 0445   CREATININE 0.87 08/22/2019 0445      Component Value Date/Time   CALCIUM 7.6 (L) 08/22/2019 0445   ALKPHOS 72 08/19/2019 0515   AST 23 08/19/2019 0515   ALT 19 08/19/2019 0515   BILITOT 0.6 08/19/2019 0515      Lab Results  Component Value Date   WBC 4.1 08/22/2019   HGB 11.0 (L) 08/22/2019   HCT 33.6 (L) 08/22/2019   MCV 99.4 08/22/2019   PLT 183 08/22/2019   No results found for: "TSH"   Chemistry      Component Value Date/Time   NA 135 08/22/2019 0445   K 3.4 (L) 08/22/2019 0445   CL 103 08/22/2019 0445   CO2 22 08/22/2019 0445   BUN 5 (L) 08/22/2019 0445   CREATININE 0.87 08/22/2019 0445      Component Value Date/Time   CALCIUM 7.6 (L) 08/22/2019 0445   ALKPHOS 72 08/19/2019 0515   AST 23 08/19/2019 0515   ALT 19 08/19/2019 0515   BILITOT 0.6 08/19/2019 0515     Lab Results  Component Value Date   VITAMINB12 250 02/16/2021       Total time spent on today's visit was ***30 minutes, including both face-to-face time and nonface-to-face time.  Time included that spent on review of records (prior notes available to me/labs/imaging if pertinent), discussing treatment and goals, answering patient's questions and coordinating care.  Cc:  Clovis Riley, L.August Saucer, MD

## 2021-10-01 ENCOUNTER — Encounter: Payer: Self-pay | Admitting: Neurology

## 2021-10-01 ENCOUNTER — Ambulatory Visit: Payer: Medicare PPO | Admitting: Neurology

## 2021-10-06 NOTE — Progress Notes (Unsigned)
Assessment/Plan:    1.  Essential Tremor              -This is evidenced by the symmetrical nature and longstanding hx of gradually getting worse.  We discussed nature and pathophysiology.  We discussed that this can continue to gradually get worse with time.  We discussed that some medications can worsen this, as can caffeine use.  We discussed medication therapy as well as surgical therapy.  She has been on topiramate with side effects.  She has been on primidone at higher dosages and told that her primidone level was too elevated (was side effect free).  She had a consultation years ago in 2017 with Dr. Linus Mako and patient opted not to pursue DBS therapy.               -After discussion, she decided to continue with the primidone, 100 mg 3 times per day.  I would not recommend we go higher than this.             -Long discussion with the patient regarding the complex effect that alcohol can have on tremor.  Chronic alcohol use can produce a tremor but discontinuation of the alcohol can also cause a tremulous state for quite some time.  We talked about the importance of weaning alcohol under medical supervision.       2.  Memory change             -Suspect that there may be some neurodegenerative process..             -Check MRI brain, especially given the fact that she has had some falls and hit head.  I suspect we will see chronic small vessel disease as well as atrophy.  Just wanted sure we are not missing other things.             -Check B12, folate, RPR, SPEP/UPEP with immunofixation.             -Patient and husband are getting ready to move to [facility] in April.  I think that is a great idea.   3..  Chronic diarrhea             -discussed with patient that this is far out of my field of expertise. she sees GI, Dr. Paulita Fujita and will need to continue to follow there.   4.  Gait instability/falls             -Suspect that this may be due to peripheral neuropathy.  Wonder if alcohol  plays a role here.             -Told her that she is high risk for breaking her hip if she falls, especially given her small body habitus.  Would like to see her use a walker at all times.  Husband states that they have 2 in the home, but they are just not used.  Like to see her using those.             -We will send her for physical therapy.   5.  B12 deficiency  -***On oral supplementation  6.  B1 deficiency  -Oral thiamine, 100 mg daily recommended  7.  Possible maxillofacial fracture, incidental finding on MRI brain in February, 2023  -We tried to call patient/husband multiple times in February in order to get further CT imaging.  Unfortunately, calls were not returned.  At this point, there is likely nothing further to do.  Subjective:   Brenda [name] was seen today in follow up for essential tremor.  My previous records were reviewed prior to todays visit. Pt denies falls.  Pt denies lightheadedness, near syncope.  They have moved to [facility] since last visit.  Last visit, we checked labs and her B12 level was low.  We called them multiple times and sent MyChart messages about it, so that she would take a B12 supplement.  Her B1 level was also low and it was recommended she take thiamine, 100 mg daily.  MRI brain was completed since last visit.  Intracranially, it was nonacute.  There was advanced small vessel disease and evidence of old, chronic infarcts.  Radiology indicated that there was an indeterminate deformity of the posterior lateral wall of the right maxillary sinus, with possible concern for recent maxillofacial fracture.  As with other attempts to contact them to try to do a maxillofacial CT, we were unable to reach the patient or husband.  We called them several times.  Current prescribed movement disorder medications: ***Primidone, 100 mg 3 times per day (she was told that primidone level was too elevated at higher dosages, without side effects?)  Current/Previously tried  tremor medications: Topiramate (cognitive change, weight loss, taste aversion); at one time she was up to primidone, 50 mg, 4 tablets in the morning, 2 tablets in the afternoon, 4 tablets at bedtime, but is currently on primidone, 50 mg, 2 tablets 3 times daily as she was told that her primidone level was too elevated at higher dosages; was on xanax (not for tremor)   ALLERGIES:   Allergies  Allergen Reactions   Diclofenac Other (See Comments)    Stomach burning   Macrodantin [Nitrofurantoin Macrocrystal] Itching, Nausea And Vomiting, Swelling and Rash    Tongue swelling   Tramadol Other (See Comments)    Stomach burning   Codeine Nausea And Vomiting   Topamax [Topiramate] Other (See Comments)    Loss of taste, fatigue    CURRENT MEDICATIONS:  No outpatient medications have been marked as taking for the 10/08/21 encounter (Appointment) with Brandin Dilday, Octaviano Batty, DO.      Objective:    PHYSICAL EXAMINATION:    VITALS:  There were no vitals filed for this visit.  GEN:  The patient appears stated age and is in NAD. HEENT:  Normocephalic, atraumatic.  The mucous membranes are moist. The superficial temporal arteries are without ropiness or tenderness. CV:  RRR Lungs:  CTAB Neck/HEME:  There are no carotid bruits bilaterally.  Neurological examination:  Orientation: The patient is alert and oriented x3. Cranial nerves: There is good facial symmetry. The speech is fluent and clear. Soft palate rises symmetrically and there is no tongue deviation. Hearing is intact to conversational tone. Sensation: Sensation is intact to light touch throughout Motor: Strength is at least antigravity x4.  Movement examination: Tone: There is normal tone in the UE/LE Abnormal movements: *** There is mild degree of rest tremor bilaterally.  There is mild postural and intention tremor.  This becomes more severe with Archimedes spirals, left greater than right. Coordination:  There is *** decremation with  RAM's, *** Gait and Station: The patient has *** difficulty arising out of a deep-seated chair without the use of the hands. The patient's stride length is good I have reviewed and interpreted the following labs independently   Chemistry      Component Value Date/Time   NA 135 08/22/2019 0445   K 3.4 (L) 08/22/2019 0445  CL 103 08/22/2019 0445   CO2 22 08/22/2019 0445   BUN 5 (L) 08/22/2019 0445   CREATININE 0.87 08/22/2019 0445      Component Value Date/Time   CALCIUM 7.6 (L) 08/22/2019 0445   ALKPHOS 72 08/19/2019 0515   AST 23 08/19/2019 0515   ALT 19 08/19/2019 0515   BILITOT 0.6 08/19/2019 0515      Lab Results  Component Value Date   WBC 4.1 08/22/2019   HGB 11.0 (L) 08/22/2019   HCT 33.6 (L) 08/22/2019   MCV 99.4 08/22/2019   PLT 183 08/22/2019   No results found for: "TSH"   Chemistry      Component Value Date/Time   NA 135 08/22/2019 0445   K 3.4 (L) 08/22/2019 0445   CL 103 08/22/2019 0445   CO2 22 08/22/2019 0445   BUN 5 (L) 08/22/2019 0445   CREATININE 0.87 08/22/2019 0445      Component Value Date/Time   CALCIUM 7.6 (L) 08/22/2019 0445   ALKPHOS 72 08/19/2019 0515   AST 23 08/19/2019 0515   ALT 19 08/19/2019 0515   BILITOT 0.6 08/19/2019 0515     Lab Results  Component Value Date   VITAMINB12 250 02/16/2021       Total time spent on today's visit was ***30 minutes, including both face-to-face time and nonface-to-face time.  Time included that spent on review of records (prior notes available to me/labs/imaging if pertinent), discussing treatment and goals, answering patient's questions and coordinating care.  Cc:  Clovis Riley, L.August Saucer, MD

## 2021-10-08 ENCOUNTER — Ambulatory Visit: Payer: Medicare PPO | Admitting: Neurology

## 2021-10-08 ENCOUNTER — Encounter: Payer: Self-pay | Admitting: Neurology

## 2021-10-08 VITALS — BP 98/60 | HR 90 | Wt 103.8 lb

## 2021-10-08 DIAGNOSIS — G609 Hereditary and idiopathic neuropathy, unspecified: Secondary | ICD-10-CM | POA: Diagnosis not present

## 2021-10-08 DIAGNOSIS — E538 Deficiency of other specified B group vitamins: Secondary | ICD-10-CM

## 2021-10-08 DIAGNOSIS — E519 Thiamine deficiency, unspecified: Secondary | ICD-10-CM

## 2021-10-08 DIAGNOSIS — Z789 Other specified health status: Secondary | ICD-10-CM

## 2021-10-08 DIAGNOSIS — G250 Essential tremor: Secondary | ICD-10-CM | POA: Diagnosis not present

## 2021-10-08 NOTE — Patient Instructions (Signed)
Start B12 1042mcg daily Start thiamine, 100 mg daily Decrease alcohol.  I would like to ultimately see [place] wean alcohol off very slowly I gave you a RX for physical therapy You can let me know if you are interested in [facility]

## 2021-10-14 ENCOUNTER — Telehealth: Payer: Self-pay | Admitting: Neurology

## 2021-10-14 NOTE — Telephone Encounter (Signed)
Paper worker work faxed [phone]

## 2021-10-14 NOTE — Telephone Encounter (Signed)
Patient's spouse Shanon Brow called requesting more information about the [employer].

## 2021-10-14 NOTE — Telephone Encounter (Signed)
Called pateint and they are interested in moving forward with the [facility]. Do you have the measurements?

## 2021-10-15 DIAGNOSIS — F419 Anxiety disorder, unspecified: Secondary | ICD-10-CM | POA: Diagnosis not present

## 2021-10-15 DIAGNOSIS — I10 Essential (primary) hypertension: Secondary | ICD-10-CM | POA: Diagnosis not present

## 2021-10-15 DIAGNOSIS — R609 Edema, unspecified: Secondary | ICD-10-CM | POA: Diagnosis not present

## 2021-10-15 DIAGNOSIS — Z23 Encounter for immunization: Secondary | ICD-10-CM | POA: Diagnosis not present

## 2021-10-15 DIAGNOSIS — R197 Diarrhea, unspecified: Secondary | ICD-10-CM | POA: Diagnosis not present

## 2021-10-15 DIAGNOSIS — R251 Tremor, unspecified: Secondary | ICD-10-CM | POA: Diagnosis not present

## 2021-10-15 DIAGNOSIS — M810 Age-related osteoporosis without current pathological fracture: Secondary | ICD-10-CM | POA: Diagnosis not present

## 2021-10-15 DIAGNOSIS — E7800 Pure hypercholesterolemia, unspecified: Secondary | ICD-10-CM | POA: Diagnosis not present

## 2021-10-19 ENCOUNTER — Ambulatory Visit: Payer: Medicare PPO | Admitting: Neurology

## 2021-10-21 DIAGNOSIS — G9009 Other idiopathic peripheral autonomic neuropathy: Secondary | ICD-10-CM | POA: Diagnosis not present

## 2021-10-21 DIAGNOSIS — R2681 Unsteadiness on feet: Secondary | ICD-10-CM | POA: Diagnosis not present

## 2021-10-21 DIAGNOSIS — R2689 Other abnormalities of gait and mobility: Secondary | ICD-10-CM | POA: Diagnosis not present

## 2021-10-26 DIAGNOSIS — R2681 Unsteadiness on feet: Secondary | ICD-10-CM | POA: Diagnosis not present

## 2021-10-26 DIAGNOSIS — R2689 Other abnormalities of gait and mobility: Secondary | ICD-10-CM | POA: Diagnosis not present

## 2021-10-26 DIAGNOSIS — G9009 Other idiopathic peripheral autonomic neuropathy: Secondary | ICD-10-CM | POA: Diagnosis not present

## 2021-10-28 ENCOUNTER — Encounter: Payer: Self-pay | Admitting: *Deleted

## 2021-10-28 ENCOUNTER — Telehealth: Payer: Self-pay | Admitting: *Deleted

## 2021-10-28 NOTE — Patient Outreach (Signed)
  Care Coordination   10/28/2021 Name: ELECTRA PALADINO MRN: [id number] DOB: 05-Feb-1940   Care Coordination Outreach Attempts:  An unsuccessful telephone outreach was attempted today to offer the patient information about available care coordination services as a benefit of their health plan.   Follow Up Plan:  Additional outreach attempts will be made to offer the patient care coordination information and services.   Encounter Outcome:  No Answer  Care Coordination Interventions Activated:  Yes   Care Coordination Interventions:  No, not indicated    [facility] [phone]

## 2021-10-29 DIAGNOSIS — R2689 Other abnormalities of gait and mobility: Secondary | ICD-10-CM | POA: Diagnosis not present

## 2021-10-29 DIAGNOSIS — R2681 Unsteadiness on feet: Secondary | ICD-10-CM | POA: Diagnosis not present

## 2021-10-29 DIAGNOSIS — G9009 Other idiopathic peripheral autonomic neuropathy: Secondary | ICD-10-CM | POA: Diagnosis not present

## 2021-11-02 DIAGNOSIS — R2681 Unsteadiness on feet: Secondary | ICD-10-CM | POA: Diagnosis not present

## 2021-11-02 DIAGNOSIS — G9009 Other idiopathic peripheral autonomic neuropathy: Secondary | ICD-10-CM | POA: Diagnosis not present

## 2021-11-02 DIAGNOSIS — R2689 Other abnormalities of gait and mobility: Secondary | ICD-10-CM | POA: Diagnosis not present

## 2021-11-05 DIAGNOSIS — R2681 Unsteadiness on feet: Secondary | ICD-10-CM | POA: Diagnosis not present

## 2021-11-05 DIAGNOSIS — G9009 Other idiopathic peripheral autonomic neuropathy: Secondary | ICD-10-CM | POA: Diagnosis not present

## 2021-11-05 DIAGNOSIS — R2689 Other abnormalities of gait and mobility: Secondary | ICD-10-CM | POA: Diagnosis not present

## 2021-11-09 DIAGNOSIS — K5289 Other specified noninfective gastroenteritis and colitis: Secondary | ICD-10-CM | POA: Diagnosis not present

## 2021-11-10 DIAGNOSIS — G9009 Other idiopathic peripheral autonomic neuropathy: Secondary | ICD-10-CM | POA: Diagnosis not present

## 2021-11-10 DIAGNOSIS — R2681 Unsteadiness on feet: Secondary | ICD-10-CM | POA: Diagnosis not present

## 2021-11-10 DIAGNOSIS — R2689 Other abnormalities of gait and mobility: Secondary | ICD-10-CM | POA: Diagnosis not present

## 2021-11-12 DIAGNOSIS — R2689 Other abnormalities of gait and mobility: Secondary | ICD-10-CM | POA: Diagnosis not present

## 2021-11-12 DIAGNOSIS — R2681 Unsteadiness on feet: Secondary | ICD-10-CM | POA: Diagnosis not present

## 2021-11-12 DIAGNOSIS — G9009 Other idiopathic peripheral autonomic neuropathy: Secondary | ICD-10-CM | POA: Diagnosis not present

## 2021-11-16 DIAGNOSIS — G9009 Other idiopathic peripheral autonomic neuropathy: Secondary | ICD-10-CM | POA: Diagnosis not present

## 2021-11-16 DIAGNOSIS — R2689 Other abnormalities of gait and mobility: Secondary | ICD-10-CM | POA: Diagnosis not present

## 2021-11-16 DIAGNOSIS — R2681 Unsteadiness on feet: Secondary | ICD-10-CM | POA: Diagnosis not present

## 2021-11-20 DIAGNOSIS — R2681 Unsteadiness on feet: Secondary | ICD-10-CM | POA: Diagnosis not present

## 2021-11-20 DIAGNOSIS — G9009 Other idiopathic peripheral autonomic neuropathy: Secondary | ICD-10-CM | POA: Diagnosis not present

## 2021-11-20 DIAGNOSIS — R2689 Other abnormalities of gait and mobility: Secondary | ICD-10-CM | POA: Diagnosis not present

## 2021-11-26 DIAGNOSIS — R2681 Unsteadiness on feet: Secondary | ICD-10-CM | POA: Diagnosis not present

## 2021-11-26 DIAGNOSIS — G9009 Other idiopathic peripheral autonomic neuropathy: Secondary | ICD-10-CM | POA: Diagnosis not present

## 2021-11-26 DIAGNOSIS — R2689 Other abnormalities of gait and mobility: Secondary | ICD-10-CM | POA: Diagnosis not present

## 2021-12-01 DIAGNOSIS — R2681 Unsteadiness on feet: Secondary | ICD-10-CM | POA: Diagnosis not present

## 2021-12-01 DIAGNOSIS — R2689 Other abnormalities of gait and mobility: Secondary | ICD-10-CM | POA: Diagnosis not present

## 2021-12-01 DIAGNOSIS — G9009 Other idiopathic peripheral autonomic neuropathy: Secondary | ICD-10-CM | POA: Diagnosis not present

## 2021-12-03 DIAGNOSIS — R2689 Other abnormalities of gait and mobility: Secondary | ICD-10-CM | POA: Diagnosis not present

## 2021-12-03 DIAGNOSIS — G9009 Other idiopathic peripheral autonomic neuropathy: Secondary | ICD-10-CM | POA: Diagnosis not present

## 2021-12-03 DIAGNOSIS — R2681 Unsteadiness on feet: Secondary | ICD-10-CM | POA: Diagnosis not present

## 2021-12-08 DIAGNOSIS — R2681 Unsteadiness on feet: Secondary | ICD-10-CM | POA: Diagnosis not present

## 2021-12-08 DIAGNOSIS — R2689 Other abnormalities of gait and mobility: Secondary | ICD-10-CM | POA: Diagnosis not present

## 2021-12-08 DIAGNOSIS — G9009 Other idiopathic peripheral autonomic neuropathy: Secondary | ICD-10-CM | POA: Diagnosis not present

## 2021-12-15 DIAGNOSIS — R2681 Unsteadiness on feet: Secondary | ICD-10-CM | POA: Diagnosis not present

## 2021-12-15 DIAGNOSIS — R2689 Other abnormalities of gait and mobility: Secondary | ICD-10-CM | POA: Diagnosis not present

## 2021-12-15 DIAGNOSIS — G9009 Other idiopathic peripheral autonomic neuropathy: Secondary | ICD-10-CM | POA: Diagnosis not present

## 2021-12-17 DIAGNOSIS — R2681 Unsteadiness on feet: Secondary | ICD-10-CM | POA: Diagnosis not present

## 2021-12-17 DIAGNOSIS — R2689 Other abnormalities of gait and mobility: Secondary | ICD-10-CM | POA: Diagnosis not present

## 2021-12-17 DIAGNOSIS — G9009 Other idiopathic peripheral autonomic neuropathy: Secondary | ICD-10-CM | POA: Diagnosis not present

## 2021-12-24 DIAGNOSIS — R2681 Unsteadiness on feet: Secondary | ICD-10-CM | POA: Diagnosis not present

## 2021-12-24 DIAGNOSIS — R2689 Other abnormalities of gait and mobility: Secondary | ICD-10-CM | POA: Diagnosis not present

## 2021-12-24 DIAGNOSIS — G9009 Other idiopathic peripheral autonomic neuropathy: Secondary | ICD-10-CM | POA: Diagnosis not present

## 2021-12-28 DIAGNOSIS — G9009 Other idiopathic peripheral autonomic neuropathy: Secondary | ICD-10-CM | POA: Diagnosis not present

## 2021-12-28 DIAGNOSIS — R2681 Unsteadiness on feet: Secondary | ICD-10-CM | POA: Diagnosis not present

## 2021-12-28 DIAGNOSIS — R2689 Other abnormalities of gait and mobility: Secondary | ICD-10-CM | POA: Diagnosis not present

## 2021-12-31 DIAGNOSIS — G9009 Other idiopathic peripheral autonomic neuropathy: Secondary | ICD-10-CM | POA: Diagnosis not present

## 2021-12-31 DIAGNOSIS — R2681 Unsteadiness on feet: Secondary | ICD-10-CM | POA: Diagnosis not present

## 2021-12-31 DIAGNOSIS — R2689 Other abnormalities of gait and mobility: Secondary | ICD-10-CM | POA: Diagnosis not present

## 2022-01-04 DIAGNOSIS — R2681 Unsteadiness on feet: Secondary | ICD-10-CM | POA: Diagnosis not present

## 2022-01-04 DIAGNOSIS — R2689 Other abnormalities of gait and mobility: Secondary | ICD-10-CM | POA: Diagnosis not present

## 2022-01-04 DIAGNOSIS — G9009 Other idiopathic peripheral autonomic neuropathy: Secondary | ICD-10-CM | POA: Diagnosis not present

## 2022-01-15 ENCOUNTER — Telehealth: Payer: Self-pay

## 2022-01-15 DIAGNOSIS — R2681 Unsteadiness on feet: Secondary | ICD-10-CM | POA: Diagnosis not present

## 2022-01-15 DIAGNOSIS — G9009 Other idiopathic peripheral autonomic neuropathy: Secondary | ICD-10-CM | POA: Diagnosis not present

## 2022-01-15 DIAGNOSIS — R2689 Other abnormalities of gait and mobility: Secondary | ICD-10-CM | POA: Diagnosis not present

## 2022-01-15 NOTE — Patient Instructions (Signed)
Visit Information  Thank you for taking time to visit with me today. Please don't hesitate to contact me if I can be of assistance to you.   Following are the goals we discussed today:   Goals Addressed             This Visit's Progress    COMPLETED: Care Coordination Activities - no follow up required       Care Coordination Interventions: Provided education to patient re: care coordination services Assessed social determinant of health barriers Reviewed foods that are higher in protein to include in diet to help gain weight            If you are experiencing a Mental Health or Behavioral Health Crisis or need someone to talk to, please call the Suicide and Crisis Lifeline: 988 call the [place] National Suicide Prevention Lifeline: (727)236-3559 or TTY: 279-239-0005 TTY (367)506-5097) to talk to a trained counselor call 1-800-273-TALK (toll free, 24 hour hotline) go to [facility] [address], [place] [phone]) call 911   Patient verbalizes understanding of instructions and care plan provided today and agrees to view in MyChart. Active MyChart status and patient understanding of how to access instructions and care plan via MyChart confirmed with patient.     No further follow up required:    Dudley Major RN, Maximiano Coss, CDE Care Management Coordinator [facility] Care Management [phone]

## 2022-01-15 NOTE — Patient Outreach (Signed)
  Care Coordination   Initial Visit Note   01/15/2022 Name: Brenda [name] MRN: [id number] DOB: 01-18-1941  Brenda [name] is a 81 y.o. year old female who sees Holloway, L.August Saucer, MD for primary care. I spoke with  Rico Sheehan by phone today.  What matters to the patients health and wellness today?  I and my husband are  now living at [facility] and  a nurse checks on use weekly.  No concerns today.  States she is trying to gain weight   Goals Addressed             This Visit's Progress    COMPLETED: Care Coordination Activities - no follow up required       Care Coordination Interventions: Provided education to patient re: care coordination services Assessed social determinant of health barriers Reviewed foods that are higher in protein to include in diet to help gain weight           SDOH assessments and interventions completed:  Yes  SDOH Interventions Today    Flowsheet Row Most Recent Value  SDOH Interventions   Food Insecurity Interventions Intervention Not Indicated  Housing Interventions Intervention Not Indicated  Transportation Interventions Intervention Not Indicated  Utilities Interventions Intervention Not Indicated        Care Coordination Interventions:  Yes, provided   Follow up plan: No further intervention required.   Encounter Outcome:  Pt. Visit Completed  Dudley Major RN, BSN,CCM, CDE Care Management Coordinator [facility] Care Management [phone]

## 2022-01-18 DIAGNOSIS — R2689 Other abnormalities of gait and mobility: Secondary | ICD-10-CM | POA: Diagnosis not present

## 2022-01-18 DIAGNOSIS — R2681 Unsteadiness on feet: Secondary | ICD-10-CM | POA: Diagnosis not present

## 2022-01-18 DIAGNOSIS — G9009 Other idiopathic peripheral autonomic neuropathy: Secondary | ICD-10-CM | POA: Diagnosis not present

## 2022-01-21 DIAGNOSIS — G9009 Other idiopathic peripheral autonomic neuropathy: Secondary | ICD-10-CM | POA: Diagnosis not present

## 2022-01-21 DIAGNOSIS — R2681 Unsteadiness on feet: Secondary | ICD-10-CM | POA: Diagnosis not present

## 2022-01-21 DIAGNOSIS — M25511 Pain in right shoulder: Secondary | ICD-10-CM | POA: Diagnosis not present

## 2022-01-21 DIAGNOSIS — R2689 Other abnormalities of gait and mobility: Secondary | ICD-10-CM | POA: Diagnosis not present

## 2022-01-25 DIAGNOSIS — R2689 Other abnormalities of gait and mobility: Secondary | ICD-10-CM | POA: Diagnosis not present

## 2022-01-25 DIAGNOSIS — G9009 Other idiopathic peripheral autonomic neuropathy: Secondary | ICD-10-CM | POA: Diagnosis not present

## 2022-01-25 DIAGNOSIS — R2681 Unsteadiness on feet: Secondary | ICD-10-CM | POA: Diagnosis not present

## 2022-01-28 DIAGNOSIS — R2681 Unsteadiness on feet: Secondary | ICD-10-CM | POA: Diagnosis not present

## 2022-01-28 DIAGNOSIS — R2689 Other abnormalities of gait and mobility: Secondary | ICD-10-CM | POA: Diagnosis not present

## 2022-01-28 DIAGNOSIS — G9009 Other idiopathic peripheral autonomic neuropathy: Secondary | ICD-10-CM | POA: Diagnosis not present

## 2022-02-01 DIAGNOSIS — G9009 Other idiopathic peripheral autonomic neuropathy: Secondary | ICD-10-CM | POA: Diagnosis not present

## 2022-02-01 DIAGNOSIS — R2689 Other abnormalities of gait and mobility: Secondary | ICD-10-CM | POA: Diagnosis not present

## 2022-02-01 DIAGNOSIS — R2681 Unsteadiness on feet: Secondary | ICD-10-CM | POA: Diagnosis not present

## 2022-02-04 DIAGNOSIS — R2689 Other abnormalities of gait and mobility: Secondary | ICD-10-CM | POA: Diagnosis not present

## 2022-02-04 DIAGNOSIS — R2681 Unsteadiness on feet: Secondary | ICD-10-CM | POA: Diagnosis not present

## 2022-02-04 DIAGNOSIS — G9009 Other idiopathic peripheral autonomic neuropathy: Secondary | ICD-10-CM | POA: Diagnosis not present

## 2022-02-18 DIAGNOSIS — R2689 Other abnormalities of gait and mobility: Secondary | ICD-10-CM | POA: Diagnosis not present

## 2022-02-18 DIAGNOSIS — G9009 Other idiopathic peripheral autonomic neuropathy: Secondary | ICD-10-CM | POA: Diagnosis not present

## 2022-02-18 DIAGNOSIS — R2681 Unsteadiness on feet: Secondary | ICD-10-CM | POA: Diagnosis not present

## 2022-02-25 DIAGNOSIS — G9009 Other idiopathic peripheral autonomic neuropathy: Secondary | ICD-10-CM | POA: Diagnosis not present

## 2022-02-25 DIAGNOSIS — R2681 Unsteadiness on feet: Secondary | ICD-10-CM | POA: Diagnosis not present

## 2022-02-25 DIAGNOSIS — R2689 Other abnormalities of gait and mobility: Secondary | ICD-10-CM | POA: Diagnosis not present

## 2022-03-03 DIAGNOSIS — Z1231 Encounter for screening mammogram for malignant neoplasm of breast: Secondary | ICD-10-CM | POA: Diagnosis not present

## 2022-03-05 DIAGNOSIS — R2681 Unsteadiness on feet: Secondary | ICD-10-CM | POA: Diagnosis not present

## 2022-03-05 DIAGNOSIS — G9009 Other idiopathic peripheral autonomic neuropathy: Secondary | ICD-10-CM | POA: Diagnosis not present

## 2022-03-05 DIAGNOSIS — R2689 Other abnormalities of gait and mobility: Secondary | ICD-10-CM | POA: Diagnosis not present

## 2022-03-08 DIAGNOSIS — R2689 Other abnormalities of gait and mobility: Secondary | ICD-10-CM | POA: Diagnosis not present

## 2022-03-08 DIAGNOSIS — G9009 Other idiopathic peripheral autonomic neuropathy: Secondary | ICD-10-CM | POA: Diagnosis not present

## 2022-03-08 DIAGNOSIS — R2681 Unsteadiness on feet: Secondary | ICD-10-CM | POA: Diagnosis not present

## 2022-03-15 DIAGNOSIS — G9009 Other idiopathic peripheral autonomic neuropathy: Secondary | ICD-10-CM | POA: Diagnosis not present

## 2022-03-15 DIAGNOSIS — R2681 Unsteadiness on feet: Secondary | ICD-10-CM | POA: Diagnosis not present

## 2022-03-15 DIAGNOSIS — R2689 Other abnormalities of gait and mobility: Secondary | ICD-10-CM | POA: Diagnosis not present

## 2022-04-01 DIAGNOSIS — R2681 Unsteadiness on feet: Secondary | ICD-10-CM | POA: Diagnosis not present

## 2022-04-01 DIAGNOSIS — R2689 Other abnormalities of gait and mobility: Secondary | ICD-10-CM | POA: Diagnosis not present

## 2022-04-01 DIAGNOSIS — G9009 Other idiopathic peripheral autonomic neuropathy: Secondary | ICD-10-CM | POA: Diagnosis not present

## 2022-04-05 DIAGNOSIS — G9009 Other idiopathic peripheral autonomic neuropathy: Secondary | ICD-10-CM | POA: Diagnosis not present

## 2022-04-05 DIAGNOSIS — R2689 Other abnormalities of gait and mobility: Secondary | ICD-10-CM | POA: Diagnosis not present

## 2022-04-05 DIAGNOSIS — R2681 Unsteadiness on feet: Secondary | ICD-10-CM | POA: Diagnosis not present

## 2022-04-08 DIAGNOSIS — G9009 Other idiopathic peripheral autonomic neuropathy: Secondary | ICD-10-CM | POA: Diagnosis not present

## 2022-04-08 DIAGNOSIS — R2681 Unsteadiness on feet: Secondary | ICD-10-CM | POA: Diagnosis not present

## 2022-04-08 DIAGNOSIS — R2689 Other abnormalities of gait and mobility: Secondary | ICD-10-CM | POA: Diagnosis not present

## 2022-04-12 DIAGNOSIS — G9009 Other idiopathic peripheral autonomic neuropathy: Secondary | ICD-10-CM | POA: Diagnosis not present

## 2022-04-12 DIAGNOSIS — R2689 Other abnormalities of gait and mobility: Secondary | ICD-10-CM | POA: Diagnosis not present

## 2022-04-12 DIAGNOSIS — R2681 Unsteadiness on feet: Secondary | ICD-10-CM | POA: Diagnosis not present

## 2022-04-14 NOTE — Progress Notes (Unsigned)
Assessment/Plan:    1.  Essential Tremor              -This is evidenced by the symmetrical nature and longstanding hx of gradually getting worse.  We discussed nature and pathophysiology.  We discussed that this can continue to gradually get worse with time.  We discussed that some medications can worsen this, as can caffeine use.  We discussed medication therapy as well as surgical therapy.  She has been on topiramate with side effects.  She has been on primidone at higher dosages and told that her primidone level was too elevated (was side effect free).  She had a consultation years ago in 2017 with Dr. Linus Mako and patient opted not to pursue DBS therapy.  She apparently went for a focused ultrasound consult at [facility] at the end of 2023/beginning 2024 and was told she would be a good candidate but she opted not to proceed.  I do not have any of those notes.             -After discussion, she decided to continue with the primidone, 100 mg 3 times per day.  I would not recommend we go higher than this.             -She understands that alcohol can have a complex effect and worsening on tremor.  I would recommend this be weaned.  -They got information on [employer] and contacted the company and it was ridiculously expensive and cost prohibitive     2.  Memory change             -Suspect that there may be some neurodegenerative process..             -MRI with advanced small vessel disease    3..  Chronic diarrhea             -Follow-up with Dr. Paulita Fujita   4.  Gait instability/falls             -Suspect that this may be due to peripheral neuropathy.  Wonder if alcohol plays a role here.             -She has been participating in physical therapy and is doing better in that regard.    5.  B12 deficiency  -On B12 supplement  6.  B1 deficiency  -She is now taking B1, 100 mg daily.  7.  Follow-up 1 year  Subjective:   Brenda [name] was seen today in follow up for essential tremor.  My  previous records were reviewed prior to todays visit.  She has been doing therapy at [facility]: Lambert Rhonda W and I have signed numerous forms for her in that regard since last visit.  Last visit, we talked about [facility] and they were interested in that.  They report today that it was very cost prohibitive - $6000 and then every 2 months the battery would need replaced for $750.  She went to [place] for focused ultrasound consult and states that she was told she would be a good candidate but she decided to not proceeded.   I do not have any notes about that.  Last visit, we noted B1/thiamine deficiency and recommended oral supplementation.  They note today that she is taking it.  She is in PT for balance.    Current prescribed movement disorder medications: Primidone, 100 mg 3 times per day Thiamine, 100 mg daily  Current/Previously tried tremor medications: Topiramate (cognitive change, weight loss,  taste aversion); at one time she was up to primidone, 50 mg, 4 tablets in the morning, 2 tablets in the afternoon, 4 tablets at bedtime, but is currently on primidone, 50 mg, 2 tablets 3 times daily as she was told that her primidone level was too elevated at higher dosages; was on xanax (not for tremor)   ALLERGIES:   Allergies  Allergen Reactions   Diclofenac Other (See Comments)    Stomach burning   Macrodantin [Nitrofurantoin Macrocrystal] Itching, Nausea And Vomiting, Swelling and Rash    Tongue swelling   Tramadol Other (See Comments)    Stomach burning   Codeine Nausea And Vomiting   Topamax [Topiramate] Other (See Comments)    Loss of taste, fatigue    CURRENT MEDICATIONS:  Current Meds  Medication Sig   ALPRAZolam (XANAX) 0.25 MG tablet Take 1 tablet (0.25 mg total) by mouth daily as needed (tremors).   Apoaequorin (PREVAGEN PO) Take 1 capsule by mouth daily.   atorvastatin (LIPITOR) 20 MG tablet Take 20 mg by mouth daily.   Multiple Vitamins-Minerals (PRESERVISION AREDS 2) CAPS Take 1 capsule  by mouth daily.   Nutritional Supplements (,FEEDING SUPPLEMENT, PROSOURCE PLUS) liquid Take 30 mLs by mouth 3 (three) times daily between meals.   primidone (MYSOLINE) 50 MG tablet Take 2 tablets (100 mg total) by mouth 3 (three) times daily. Must keep follow up 07/07/2020 for ongoing refills   promethazine (PHENERGAN) 25 MG tablet Take 25 mg by mouth 5 (five) times daily as needed for nausea or vomiting.    Vitamin D, Ergocalciferol, (DRISDOL) 1.25 MG (50000 UNIT) CAPS capsule Take 50,000 Units by mouth every 7 (seven) days.      Objective:    PHYSICAL EXAMINATION:    VITALS:   Vitals:   04/15/22 1449  BP: 116/70  Pulse: 72  SpO2: 99%  Weight: 110 lb 3.2 oz (50 kg)  Height: 5\' 3"  (1.6 m)    GEN:  The patient appears stated age and is in NAD.  She is intermittently tearful when talking about tremor. HEENT:  Normocephalic, atraumatic.  The mucous membranes are moist. The superficial temporal arteries are without ropiness or tenderness. CV:  RRR Lungs:  CTAB Neck/HEME:  There are no carotid bruits bilaterally.  Neurological examination:  Orientation: The patient is alert and oriented x3. Cranial nerves: There is good facial symmetry. The speech is fluent and clear. Soft palate rises symmetrically and there is no tongue deviation. Hearing is intact to conversational tone. Sensation: Sensation is intact to light touch throughout Motor: Strength is at least antigravity x4.  Movement examination: Tone: There is normal tone in the UE/LE Abnormal movements:  There is mild degree of rest tremor bilaterally.  The left is worse than the right.  There is mild postural and intention tremor.  It is much more severe when she writes. Coordination:  There is no decremation with RAM's, with any form of RAMS, including alternating supination and pronation of the forearm, hand opening and closing, finger taps, heel taps and toe taps. Gait and Station: The patient pushes off to arise.  She is  wide-based.  She is unsteady.  This is same as previous. I have reviewed and interpreted the following labs independently   Chemistry      Component Value Date/Time   NA 135 08/22/2019 0445   K 3.4 (L) 08/22/2019 0445   CL 103 08/22/2019 0445   CO2 22 08/22/2019 0445   BUN 5 (L) 08/22/2019 0445  CREATININE 0.87 08/22/2019 0445      Component Value Date/Time   CALCIUM 7.6 (L) 08/22/2019 0445   ALKPHOS 72 08/19/2019 0515   AST 23 08/19/2019 0515   ALT 19 08/19/2019 0515   BILITOT 0.6 08/19/2019 0515      Lab Results  Component Value Date   WBC 4.1 08/22/2019   HGB 11.0 (L) 08/22/2019   HCT 33.6 (L) 08/22/2019   MCV 99.4 08/22/2019   PLT 183 08/22/2019   No results found for: "TSH"   Chemistry      Component Value Date/Time   NA 135 08/22/2019 0445   K 3.4 (L) 08/22/2019 0445   CL 103 08/22/2019 0445   CO2 22 08/22/2019 0445   BUN 5 (L) 08/22/2019 0445   CREATININE 0.87 08/22/2019 0445      Component Value Date/Time   CALCIUM 7.6 (L) 08/22/2019 0445   ALKPHOS 72 08/19/2019 0515   AST 23 08/19/2019 0515   ALT 19 08/19/2019 0515   BILITOT 0.6 08/19/2019 0515     Lab Results  Component Value Date   VITAMINB12 250 02/16/2021     Cc:  Alroy Dust, L.Marlou Sa, MD

## 2022-04-15 ENCOUNTER — Encounter: Payer: Self-pay | Admitting: Neurology

## 2022-04-15 ENCOUNTER — Ambulatory Visit: Payer: Medicare PPO | Admitting: Neurology

## 2022-04-15 VITALS — BP 116/70 | HR 72 | Ht 63.0 in | Wt 110.2 lb

## 2022-04-15 DIAGNOSIS — Z789 Other specified health status: Secondary | ICD-10-CM

## 2022-04-15 DIAGNOSIS — G250 Essential tremor: Secondary | ICD-10-CM

## 2022-04-15 NOTE — Patient Instructions (Signed)
Good to see you today!  The physicians and staff at [facility] are committed to providing excellent care. You may receive a survey requesting feedback about your experience at our office. We strive to receive "very good" responses to the survey questions. If you feel that your experience would prevent you from giving the office a "very good " response, please contact our office to try to remedy the situation. We may be reached at [phone]. Thank you for taking the time out of your busy day to complete the survey.  

## 2022-04-19 DIAGNOSIS — R2689 Other abnormalities of gait and mobility: Secondary | ICD-10-CM | POA: Diagnosis not present

## 2022-04-19 DIAGNOSIS — G9009 Other idiopathic peripheral autonomic neuropathy: Secondary | ICD-10-CM | POA: Diagnosis not present

## 2022-04-20 DIAGNOSIS — E7800 Pure hypercholesterolemia, unspecified: Secondary | ICD-10-CM | POA: Diagnosis not present

## 2022-04-20 DIAGNOSIS — I10 Essential (primary) hypertension: Secondary | ICD-10-CM | POA: Diagnosis not present

## 2022-04-20 DIAGNOSIS — F419 Anxiety disorder, unspecified: Secondary | ICD-10-CM | POA: Diagnosis not present

## 2022-04-20 DIAGNOSIS — Z789 Other specified health status: Secondary | ICD-10-CM | POA: Diagnosis not present

## 2022-04-20 DIAGNOSIS — E538 Deficiency of other specified B group vitamins: Secondary | ICD-10-CM | POA: Diagnosis not present

## 2022-04-20 DIAGNOSIS — M810 Age-related osteoporosis without current pathological fracture: Secondary | ICD-10-CM | POA: Diagnosis not present

## 2022-04-20 DIAGNOSIS — R7301 Impaired fasting glucose: Secondary | ICD-10-CM | POA: Diagnosis not present

## 2022-04-20 DIAGNOSIS — E559 Vitamin D deficiency, unspecified: Secondary | ICD-10-CM | POA: Diagnosis not present

## 2022-04-20 DIAGNOSIS — I700 Atherosclerosis of aorta: Secondary | ICD-10-CM | POA: Diagnosis not present

## 2022-04-20 DIAGNOSIS — Z0000 Encounter for general adult medical examination without abnormal findings: Secondary | ICD-10-CM | POA: Diagnosis not present

## 2022-04-20 DIAGNOSIS — E519 Thiamine deficiency, unspecified: Secondary | ICD-10-CM | POA: Diagnosis not present

## 2022-04-20 DIAGNOSIS — E46 Unspecified protein-calorie malnutrition: Secondary | ICD-10-CM | POA: Diagnosis not present

## 2022-04-22 DIAGNOSIS — R2689 Other abnormalities of gait and mobility: Secondary | ICD-10-CM | POA: Diagnosis not present

## 2022-04-22 DIAGNOSIS — G9009 Other idiopathic peripheral autonomic neuropathy: Secondary | ICD-10-CM | POA: Diagnosis not present

## 2022-04-26 DIAGNOSIS — G9009 Other idiopathic peripheral autonomic neuropathy: Secondary | ICD-10-CM | POA: Diagnosis not present

## 2022-04-26 DIAGNOSIS — R2689 Other abnormalities of gait and mobility: Secondary | ICD-10-CM | POA: Diagnosis not present

## 2022-04-29 DIAGNOSIS — R2689 Other abnormalities of gait and mobility: Secondary | ICD-10-CM | POA: Diagnosis not present

## 2022-04-29 DIAGNOSIS — G9009 Other idiopathic peripheral autonomic neuropathy: Secondary | ICD-10-CM | POA: Diagnosis not present

## 2022-05-03 DIAGNOSIS — R2689 Other abnormalities of gait and mobility: Secondary | ICD-10-CM | POA: Diagnosis not present

## 2022-05-03 DIAGNOSIS — G9009 Other idiopathic peripheral autonomic neuropathy: Secondary | ICD-10-CM | POA: Diagnosis not present

## 2022-05-05 DIAGNOSIS — C44622 Squamous cell carcinoma of skin of right upper limb, including shoulder: Secondary | ICD-10-CM | POA: Diagnosis not present

## 2022-05-05 DIAGNOSIS — C44321 Squamous cell carcinoma of skin of nose: Secondary | ICD-10-CM | POA: Diagnosis not present

## 2022-05-05 DIAGNOSIS — Z85828 Personal history of other malignant neoplasm of skin: Secondary | ICD-10-CM | POA: Diagnosis not present

## 2022-05-05 DIAGNOSIS — L570 Actinic keratosis: Secondary | ICD-10-CM | POA: Diagnosis not present

## 2022-05-05 DIAGNOSIS — R229 Localized swelling, mass and lump, unspecified: Secondary | ICD-10-CM | POA: Diagnosis not present

## 2022-05-05 DIAGNOSIS — Z08 Encounter for follow-up examination after completed treatment for malignant neoplasm: Secondary | ICD-10-CM | POA: Diagnosis not present

## 2022-05-05 DIAGNOSIS — D485 Neoplasm of uncertain behavior of skin: Secondary | ICD-10-CM | POA: Diagnosis not present

## 2022-05-05 DIAGNOSIS — L821 Other seborrheic keratosis: Secondary | ICD-10-CM | POA: Diagnosis not present

## 2022-05-05 DIAGNOSIS — L578 Other skin changes due to chronic exposure to nonionizing radiation: Secondary | ICD-10-CM | POA: Diagnosis not present

## 2022-05-05 DIAGNOSIS — L814 Other melanin hyperpigmentation: Secondary | ICD-10-CM | POA: Diagnosis not present

## 2022-05-10 DIAGNOSIS — R2689 Other abnormalities of gait and mobility: Secondary | ICD-10-CM | POA: Diagnosis not present

## 2022-05-10 DIAGNOSIS — G9009 Other idiopathic peripheral autonomic neuropathy: Secondary | ICD-10-CM | POA: Diagnosis not present

## 2022-05-17 DIAGNOSIS — G9009 Other idiopathic peripheral autonomic neuropathy: Secondary | ICD-10-CM | POA: Diagnosis not present

## 2022-05-17 DIAGNOSIS — R2689 Other abnormalities of gait and mobility: Secondary | ICD-10-CM | POA: Diagnosis not present

## 2022-05-18 DIAGNOSIS — R197 Diarrhea, unspecified: Secondary | ICD-10-CM | POA: Diagnosis not present

## 2022-05-20 DIAGNOSIS — R2689 Other abnormalities of gait and mobility: Secondary | ICD-10-CM | POA: Diagnosis not present

## 2022-05-20 DIAGNOSIS — G9009 Other idiopathic peripheral autonomic neuropathy: Secondary | ICD-10-CM | POA: Diagnosis not present

## 2022-05-27 DIAGNOSIS — G9009 Other idiopathic peripheral autonomic neuropathy: Secondary | ICD-10-CM | POA: Diagnosis not present

## 2022-05-27 DIAGNOSIS — R2689 Other abnormalities of gait and mobility: Secondary | ICD-10-CM | POA: Diagnosis not present

## 2022-05-31 DIAGNOSIS — R2689 Other abnormalities of gait and mobility: Secondary | ICD-10-CM | POA: Diagnosis not present

## 2022-05-31 DIAGNOSIS — G9009 Other idiopathic peripheral autonomic neuropathy: Secondary | ICD-10-CM | POA: Diagnosis not present

## 2022-06-11 DIAGNOSIS — J069 Acute upper respiratory infection, unspecified: Secondary | ICD-10-CM | POA: Diagnosis not present

## 2022-07-08 ENCOUNTER — Other Ambulatory Visit: Payer: Self-pay

## 2022-07-08 ENCOUNTER — Encounter (HOSPITAL_COMMUNITY): Payer: Self-pay

## 2022-07-08 ENCOUNTER — Emergency Department (HOSPITAL_COMMUNITY): Payer: Medicare PPO

## 2022-07-08 ENCOUNTER — Inpatient Hospital Stay (HOSPITAL_COMMUNITY)
Admission: EM | Admit: 2022-07-08 | Discharge: 2022-07-14 | DRG: 444 | Disposition: A | Discharge status: Home / Self Care | Payer: Medicare PPO | Attending: Family Medicine | Admitting: Family Medicine

## 2022-07-08 DIAGNOSIS — K2960 Other gastritis without bleeding: Secondary | ICD-10-CM | POA: Diagnosis not present

## 2022-07-08 DIAGNOSIS — E8809 Other disorders of plasma-protein metabolism, not elsewhere classified: Secondary | ICD-10-CM | POA: Diagnosis present

## 2022-07-08 DIAGNOSIS — K3189 Other diseases of stomach and duodenum: Secondary | ICD-10-CM | POA: Diagnosis present

## 2022-07-08 DIAGNOSIS — K589 Irritable bowel syndrome without diarrhea: Secondary | ICD-10-CM | POA: Diagnosis present

## 2022-07-08 DIAGNOSIS — I10 Essential (primary) hypertension: Secondary | ICD-10-CM | POA: Diagnosis present

## 2022-07-08 DIAGNOSIS — K219 Gastro-esophageal reflux disease without esophagitis: Secondary | ICD-10-CM | POA: Diagnosis present

## 2022-07-08 DIAGNOSIS — E872 Acidosis, unspecified: Secondary | ICD-10-CM | POA: Diagnosis present

## 2022-07-08 DIAGNOSIS — R1084 Generalized abdominal pain: Secondary | ICD-10-CM | POA: Diagnosis not present

## 2022-07-08 DIAGNOSIS — M810 Age-related osteoporosis without current pathological fracture: Secondary | ICD-10-CM | POA: Diagnosis present

## 2022-07-08 DIAGNOSIS — R1111 Vomiting without nausea: Secondary | ICD-10-CM | POA: Diagnosis not present

## 2022-07-08 DIAGNOSIS — Z87891 Personal history of nicotine dependence: Secondary | ICD-10-CM

## 2022-07-08 DIAGNOSIS — R933 Abnormal findings on diagnostic imaging of other parts of digestive tract: Secondary | ICD-10-CM | POA: Diagnosis not present

## 2022-07-08 DIAGNOSIS — R109 Unspecified abdominal pain: Secondary | ICD-10-CM | POA: Diagnosis not present

## 2022-07-08 DIAGNOSIS — K2980 Duodenitis without bleeding: Secondary | ICD-10-CM | POA: Diagnosis not present

## 2022-07-08 DIAGNOSIS — N170 Acute kidney failure with tubular necrosis: Secondary | ICD-10-CM | POA: Diagnosis present

## 2022-07-08 DIAGNOSIS — K2900 Acute gastritis without bleeding: Secondary | ICD-10-CM | POA: Diagnosis present

## 2022-07-08 DIAGNOSIS — E7800 Pure hypercholesterolemia, unspecified: Secondary | ICD-10-CM | POA: Diagnosis present

## 2022-07-08 DIAGNOSIS — D61818 Other pancytopenia: Secondary | ICD-10-CM | POA: Diagnosis present

## 2022-07-08 DIAGNOSIS — Z85828 Personal history of other malignant neoplasm of skin: Secondary | ICD-10-CM

## 2022-07-08 DIAGNOSIS — R1013 Epigastric pain: Secondary | ICD-10-CM

## 2022-07-08 DIAGNOSIS — I700 Atherosclerosis of aorta: Secondary | ICD-10-CM | POA: Diagnosis not present

## 2022-07-08 DIAGNOSIS — G250 Essential tremor: Secondary | ICD-10-CM | POA: Diagnosis present

## 2022-07-08 DIAGNOSIS — K8020 Calculus of gallbladder without cholecystitis without obstruction: Principal | ICD-10-CM | POA: Diagnosis present

## 2022-07-08 DIAGNOSIS — R54 Age-related physical debility: Secondary | ICD-10-CM | POA: Diagnosis present

## 2022-07-08 DIAGNOSIS — Z79899 Other long term (current) drug therapy: Secondary | ICD-10-CM

## 2022-07-08 DIAGNOSIS — Z885 Allergy status to narcotic agent status: Secondary | ICD-10-CM

## 2022-07-08 DIAGNOSIS — N179 Acute kidney failure, unspecified: Secondary | ICD-10-CM | POA: Diagnosis not present

## 2022-07-08 DIAGNOSIS — E860 Dehydration: Secondary | ICD-10-CM | POA: Diagnosis present

## 2022-07-08 DIAGNOSIS — K8050 Calculus of bile duct without cholangitis or cholecystitis without obstruction: Secondary | ICD-10-CM | POA: Diagnosis not present

## 2022-07-08 DIAGNOSIS — K449 Diaphragmatic hernia without obstruction or gangrene: Secondary | ICD-10-CM | POA: Diagnosis present

## 2022-07-08 DIAGNOSIS — Z91018 Allergy to other foods: Secondary | ICD-10-CM

## 2022-07-08 DIAGNOSIS — Z801 Family history of malignant neoplasm of trachea, bronchus and lung: Secondary | ICD-10-CM | POA: Diagnosis not present

## 2022-07-08 DIAGNOSIS — E876 Hypokalemia: Secondary | ICD-10-CM | POA: Diagnosis present

## 2022-07-08 DIAGNOSIS — Z888 Allergy status to other drugs, medicaments and biological substances status: Secondary | ICD-10-CM

## 2022-07-08 DIAGNOSIS — M5030 Other cervical disc degeneration, unspecified cervical region: Secondary | ICD-10-CM | POA: Diagnosis present

## 2022-07-08 DIAGNOSIS — E861 Hypovolemia: Secondary | ICD-10-CM

## 2022-07-08 DIAGNOSIS — R112 Nausea with vomiting, unspecified: Secondary | ICD-10-CM | POA: Diagnosis not present

## 2022-07-08 DIAGNOSIS — I959 Hypotension, unspecified: Secondary | ICD-10-CM | POA: Diagnosis present

## 2022-07-08 DIAGNOSIS — Z825 Family history of asthma and other chronic lower respiratory diseases: Secondary | ICD-10-CM

## 2022-07-08 DIAGNOSIS — Z8249 Family history of ischemic heart disease and other diseases of the circulatory system: Secondary | ICD-10-CM

## 2022-07-08 DIAGNOSIS — F419 Anxiety disorder, unspecified: Secondary | ICD-10-CM | POA: Diagnosis not present

## 2022-07-08 DIAGNOSIS — J449 Chronic obstructive pulmonary disease, unspecified: Secondary | ICD-10-CM | POA: Diagnosis not present

## 2022-07-08 DIAGNOSIS — A419 Sepsis, unspecified organism: Secondary | ICD-10-CM | POA: Diagnosis not present

## 2022-07-08 LAB — CBC
HCT: 30.6 % — ABNORMAL LOW (ref 36.0–46.0)
Hemoglobin: 10.4 g/dL — ABNORMAL LOW (ref 12.0–15.0)
MCH: 33.8 pg (ref 26.0–34.0)
MCHC: 34 g/dL (ref 30.0–36.0)
MCV: 99.4 fL (ref 80.0–100.0)
Platelets: 118 10*3/uL — ABNORMAL LOW (ref 150–400)
RBC: 3.08 MIL/uL — ABNORMAL LOW (ref 3.87–5.11)
RDW: 13.7 % (ref 11.5–15.5)
WBC: 3.3 10*3/uL — ABNORMAL LOW (ref 4.0–10.5)
nRBC: 0 % (ref 0.0–0.2)

## 2022-07-08 LAB — COMPREHENSIVE METABOLIC PANEL
ALT: 23 U/L (ref 0–44)
AST: 43 U/L — ABNORMAL HIGH (ref 15–41)
Albumin: 3.1 g/dL — ABNORMAL LOW (ref 3.5–5.0)
Alkaline Phosphatase: 85 U/L (ref 38–126)
Anion gap: 9 (ref 5–15)
BUN: 60 mg/dL — ABNORMAL HIGH (ref 8–23)
CO2: 16 mmol/L — ABNORMAL LOW (ref 22–32)
Calcium: 7.3 mg/dL — ABNORMAL LOW (ref 8.9–10.3)
Chloride: 110 mmol/L (ref 98–111)
Creatinine, Ser: 1.76 mg/dL — ABNORMAL HIGH (ref 0.44–1.00)
GFR, Estimated: 29 mL/min — ABNORMAL LOW (ref >60)
Glucose, Bld: 112 mg/dL — ABNORMAL HIGH (ref 70–99)
Potassium: 2.8 mmol/L — ABNORMAL LOW (ref 3.5–5.1)
Sodium: 135 mmol/L (ref 135–145)
Total Bilirubin: 0.7 mg/dL (ref 0.3–1.2)
Total Protein: 6.2 g/dL — ABNORMAL LOW (ref 6.5–8.1)

## 2022-07-08 LAB — CBC WITH DIFFERENTIAL/PLATELET
Abs Immature Granulocytes: 0.01 10*3/uL (ref 0.00–0.07)
Basophils Absolute: 0 10*3/uL (ref 0.0–0.1)
Basophils Relative: 1 %
Eosinophils Absolute: 0.1 10*3/uL (ref 0.0–0.5)
Eosinophils Relative: 2 %
HCT: 38.1 % (ref 36.0–46.0)
Hemoglobin: 12.8 g/dL (ref 12.0–15.0)
Immature Granulocytes: 0 %
Lymphocytes Relative: 30 %
Lymphs Abs: 1.6 10*3/uL (ref 0.7–4.0)
MCH: 33.1 pg (ref 26.0–34.0)
MCHC: 33.6 g/dL (ref 30.0–36.0)
MCV: 98.4 fL (ref 80.0–100.0)
Monocytes Absolute: 0.9 10*3/uL (ref 0.1–1.0)
Monocytes Relative: 16 %
Neutro Abs: 2.7 10*3/uL (ref 1.7–7.7)
Neutrophils Relative %: 51 %
Platelets: 156 10*3/uL (ref 150–400)
RBC: 3.87 MIL/uL (ref 3.87–5.11)
RDW: 13.6 % (ref 11.5–15.5)
WBC: 5.3 10*3/uL (ref 4.0–10.5)
nRBC: 0 % (ref 0.0–0.2)

## 2022-07-08 LAB — MAGNESIUM: Magnesium: 1.8 mg/dL (ref 1.7–2.4)

## 2022-07-08 LAB — LIPASE, BLOOD: Lipase: 54 U/L — ABNORMAL HIGH (ref 11–51)

## 2022-07-08 LAB — URINALYSIS, ROUTINE W REFLEX MICROSCOPIC
Bilirubin Urine: NEGATIVE
Glucose, UA: NEGATIVE mg/dL
Hgb urine dipstick: NEGATIVE
Ketones, ur: NEGATIVE mg/dL
Leukocytes,Ua: NEGATIVE
Nitrite: NEGATIVE
Protein, ur: 30 mg/dL — AB
Specific Gravity, Urine: 1.013 (ref 1.005–1.030)
pH: 6 (ref 5.0–8.0)

## 2022-07-08 LAB — PROTIME-INR
INR: 1.1 (ref 0.8–1.2)
Prothrombin Time: 14.7 seconds (ref 11.4–15.2)

## 2022-07-08 LAB — CREATININE, SERUM
Creatinine, Ser: 1.23 mg/dL — ABNORMAL HIGH (ref 0.44–1.00)
GFR, Estimated: 44 mL/min — ABNORMAL LOW (ref >60)

## 2022-07-08 LAB — LACTIC ACID, PLASMA: Lactic Acid, Venous: 1 mmol/L (ref 0.5–1.9)

## 2022-07-08 LAB — APTT: aPTT: 30 seconds (ref 24–36)

## 2022-07-08 MED ORDER — ACETAMINOPHEN 325 MG PO TABS: 650.0000 mg | ORAL_TABLET | Freq: Four times a day (QID) | ORAL | Status: DC | PRN

## 2022-07-08 MED ORDER — POTASSIUM CHLORIDE 10 MEQ/100ML IV SOLN
10.0000 meq | INTRAVENOUS | Status: AC
  Administered 2022-07-08 (×2): 10 meq via INTRAVENOUS
  Filled 2022-07-08 (×2): qty 100

## 2022-07-08 MED ORDER — ONDANSETRON HCL 4 MG/2ML IJ SOLN
4.0000 mg | Freq: Four times a day (QID) | INTRAMUSCULAR | Status: DC | PRN
  Administered 2022-07-09: 4 mg via INTRAVENOUS
  Filled 2022-07-08: qty 2

## 2022-07-08 MED ORDER — LACTATED RINGERS IV BOLUS (SEPSIS)
1000.0000 mL | Freq: Once | INTRAVENOUS | Status: AC
  Administered 2022-07-08: 1000 mL via INTRAVENOUS

## 2022-07-08 MED ORDER — ALBUTEROL SULFATE (2.5 MG/3ML) 0.083% IN NEBU: 2.5000 mg | INHALATION_SOLUTION | RESPIRATORY_TRACT | Status: DC | PRN

## 2022-07-08 MED ORDER — SODIUM CHLORIDE 0.9 % IV BOLUS
1000.0000 mL | Freq: Once | INTRAVENOUS | Status: AC
  Administered 2022-07-08: 1000 mL via INTRAVENOUS

## 2022-07-08 MED ORDER — SODIUM CHLORIDE 0.9 % IV SOLN: INTRAVENOUS | Status: DC

## 2022-07-08 MED ORDER — PRIMIDONE 50 MG PO TABS
100.0000 mg | ORAL_TABLET | Freq: Three times a day (TID) | ORAL | Status: DC
  Administered 2022-07-08 – 2022-07-14 (×15): 100 mg via ORAL
  Filled 2022-07-08 (×20): qty 2

## 2022-07-08 MED ORDER — ALUM & MAG HYDROXIDE-SIMETH 200-200-20 MG/5ML PO SUSP
30.0000 mL | Freq: Once | ORAL | Status: AC
  Administered 2022-07-08: 30 mL via ORAL
  Filled 2022-07-08: qty 30

## 2022-07-08 MED ORDER — ACETAMINOPHEN 650 MG RE SUPP: 650.0000 mg | Freq: Four times a day (QID) | RECTAL | Status: DC | PRN

## 2022-07-08 MED ORDER — ONDANSETRON HCL 4 MG PO TABS: 4.0000 mg | ORAL_TABLET | Freq: Four times a day (QID) | ORAL | Status: DC | PRN

## 2022-07-08 MED ORDER — TRAZODONE HCL 50 MG PO TABS
25.0000 mg | ORAL_TABLET | Freq: Every evening | ORAL | Status: DC | PRN
  Administered 2022-07-11 – 2022-07-13 (×3): 25 mg via ORAL
  Filled 2022-07-08 (×3): qty 1

## 2022-07-08 MED ORDER — FAMOTIDINE IN NACL 20-0.9 MG/50ML-% IV SOLN
20.0000 mg | Freq: Once | INTRAVENOUS | Status: AC
  Administered 2022-07-08: 20 mg via INTRAVENOUS
  Filled 2022-07-08: qty 50

## 2022-07-08 MED ORDER — ENOXAPARIN SODIUM 30 MG/0.3ML IJ SOSY
30.0000 mg | PREFILLED_SYRINGE | INTRAMUSCULAR | Status: DC
  Administered 2022-07-08 – 2022-07-10 (×3): 30 mg via SUBCUTANEOUS
  Filled 2022-07-08 (×3): qty 0.3

## 2022-07-08 MED ORDER — ATORVASTATIN CALCIUM 20 MG PO TABS
20.0000 mg | ORAL_TABLET | Freq: Every day | ORAL | Status: DC
  Administered 2022-07-09 – 2022-07-14 (×5): 20 mg via ORAL
  Filled 2022-07-08 (×5): qty 1

## 2022-07-08 MED ORDER — POTASSIUM CHLORIDE 10 MEQ/100ML IV SOLN
10.0000 meq | INTRAVENOUS | Status: AC
  Administered 2022-07-08 – 2022-07-09 (×4): 10 meq via INTRAVENOUS
  Filled 2022-07-08 (×4): qty 100

## 2022-07-08 NOTE — H&P (Signed)
History and Physical  ARMATHA KARGE GNF:[id number] DOB: 03-17-1940 DOA: 07/08/2022  PCP: Clovis Riley, L.August Saucer, MD   Chief Complaint: abd pain, vomiting   HPI: Brenda [name] is a 82 y.o. female with medical history significant for IBS, chronic diarrhea, essential tremor being admitted for one month of intermittent abdominal discomfort and vomiting.  She tells me that she has had chronic diarrhea for the last 20 years or so, there has been no change in this.  Her gastroenterologist is Dr. Dulce Sellar.  Starting about a month ago, she started having pretty severe epigastric pain that would radiate straight into her back, every time she tried to eat solid food.  A while after eating, she vomits up the food, and her pain dissipates.  She has never had a problem like this before.  Denies any fevers or chills, no blood in her emesis.  Denies any difficulty swallowing, says she does not feel like the food is getting stuck in her throat or her chest.  The past month, the only thing she has been able to keep down his popsicles, she does not vomit or have significant pain if she drinks liquids.  ED Course: Department, she had significant initial hypotension blood pressure 87/40, on evaluation in the has now improved to 126/59 with IV fluids.  She has been afebrile, saturating well on room air.  Lab work was done and shows markable CBC, potassium 2.8, creatinine 1.76 from normal baseline.  Lactic acid 1.0.  She was started on potassium replacement, and hospitalist was contacted for admission.  Review of Systems: Please see HPI for pertinent positives and negatives. A complete 10 system review of systems are otherwise negative.  Past Medical History:  Diagnosis Date   Anxiety    Chronic diarrhea    Coarse tremors    Colon polyp    DDD (degenerative disc disease), cervical    Hiatal hernia    History of skin cancer    Hypercholesteremia    Hypertension    Migraines    Osteoporosis    Shortness of breath dyspnea     with exertion   Tremor    Weight loss    Past Surgical History:  Procedure Laterality Date   ESOPHAGEAL MANOMETRY N/A 01/15/2015   Procedure: ESOPHAGEAL MANOMETRY (EM);  Surgeon: Graylin Shiver, MD;  Location: [facility];  Service: Endoscopy;  Laterality: N/A;   HIATAL HERNIA REPAIR     KYPHOPLASTY N/A 12/22/2017   Procedure: LUMBAR TWO KYPHOPLASTY;  Surgeon: Estill Bamberg, MD;  Location: [facility] OR;  Service: Orthopedics;  Laterality: N/A;  LUMBAR TWO KYPHOPLASTY   RHINOPLASTY     SINUS SURGERY WITH INSTATRAK      Social History:  reports that she quit smoking about 33 years ago. Her smoking use included cigarettes. She has never used smokeless tobacco. She reports current alcohol use. She reports that she does not use drugs.   Allergies  Allergen Reactions   Diclofenac Other (See Comments)    Stomach burning   Macrodantin [Nitrofurantoin Macrocrystal] Itching, Nausea And Vomiting, Swelling and Rash    Tongue swelling   Tramadol Other (See Comments)    Stomach burning   Codeine Nausea And Vomiting   Topamax [Topiramate] Other (See Comments)    Loss of taste, fatigue    Family History  Problem Relation Age of Onset   Lung cancer Mother    COPD Father    Tremor Father    Hypertension Brother    Tremor Brother  Prior to Admission medications   Medication Sig Start Date End Date Taking? Authorizing Provider  ALPRAZolam (XANAX) 0.25 MG tablet Take 1 tablet (0.25 mg total) by mouth daily as needed (tremors). 08/23/19   Narda Bonds, MD  Apoaequorin (PREVAGEN PO) Take 1 capsule by mouth daily.    [provider]  atorvastatin (LIPITOR) 20 MG tablet Take 20 mg by mouth daily.    [provider]  Multiple Vitamins-Minerals (PRESERVISION AREDS 2) CAPS Take 1 capsule by mouth daily.    [provider]  Nutritional Supplements (,FEEDING SUPPLEMENT, PROSOURCE PLUS) liquid Take 30 mLs by mouth 3 (three) times daily between meals. 08/23/19   Narda Bonds, MD  primidone (MYSOLINE) 50 MG tablet Take 2 tablets (100 mg total) by mouth 3 (three) times daily. Must keep follow up 07/07/2020 for ongoing refills 06/04/20   Butch Penny, NP  promethazine (PHENERGAN) 25 MG tablet Take 25 mg by mouth 5 (five) times daily as needed for nausea or vomiting.  08/10/19   [provider]  Vitamin D, Ergocalciferol, (DRISDOL) 1.25 MG (50000 UNIT) CAPS capsule Take 50,000 Units by mouth every 7 (seven) days.    [provider]    Physical Exam: BP (!) 101/51 (BP Location: Right Arm)   Pulse 88   Temp 97.6 F (36.4 C) (Oral)   Resp 12   Ht 5\' 4"  (1.626 m)   Wt 49.9 kg   SpO2 100%   BMI 18.88 kg/m   General:  Alert, oriented, calm, in no acute distress looks a little dry on exam, but otherwise looks comfortable sitting up in bed having a popsicle Eyes: EOMI, clear conjuctivae, white sclerea Neck: supple, no masses, trachea mildline  Cardiovascular: RRR, no murmurs or rubs, no peripheral edema  Respiratory: clear to auscultation bilaterally, no wheezes, no crackles  Abdomen: soft, nontender, nondistended, normal bowel tones heard  Skin: dry, no rashes  Musculoskeletal: no joint effusions, normal range of motion  Psychiatric: appropriate affect, normal speech  Neurologic: extraocular muscles intact, clear speech, moving all extremities with intact sensorium          Labs on Admission:  Basic Metabolic Panel: Recent Labs  Lab 07/08/22 1204  NA 135  K 2.8*  CL 110  CO2 16*  GLUCOSE 112*  BUN 60*  CREATININE 1.76*  CALCIUM 7.3*   Liver Function Tests: Recent Labs  Lab 07/08/22 1204  AST 43*  ALT 23  ALKPHOS 85  BILITOT 0.7  PROT 6.2*  ALBUMIN 3.1*   Recent Labs  Lab 07/08/22 1312  LIPASE 54*   No results for input(s): "AMMONIA" in the last 168 hours. CBC: Recent Labs  Lab 07/08/22 1204  WBC 5.3  NEUTROABS 2.7  HGB 12.8  HCT 38.1  MCV 98.4  PLT 156   Cardiac Enzymes: No results for input(s):  "CKTOTAL", "CKMB", "CKMBINDEX", "TROPONINI" in the last 168 hours.  BNP (last 3 results) No results for input(s): "BNP" in the last 8760 hours.  ProBNP (last 3 results) No results for input(s): "PROBNP" in the last 8760 hours.  CBG: No results for input(s): "GLUCAP" in the last 168 hours.  Radiological Exams on Admission: CT ABDOMEN PELVIS WO CONTRAST  Result Date: 07/08/2022 CLINICAL DATA:  Nonlocalized abdominal pain EXAM: CT ABDOMEN AND PELVIS WITHOUT CONTRAST TECHNIQUE: Multidetector CT imaging of the abdomen and pelvis was performed following the standard protocol without IV contrast. RADIATION DOSE REDUCTION: This exam was performed according to the departmental dose-optimization program which includes  automated exposure control, adjustment of the mA and/or kV according to patient size and/or use of iterative reconstruction technique. COMPARISON:  Noncontrast CT 08/13/2019 and older. FINDINGS: Lower chest: There is some linear opacity lung bases likely scar or atelectasis. No pleural effusion. Some breathing motion. Moderate hiatal hernia. Coronary artery calcifications are seen. Hepatobiliary: Stones in the nondilated gallbladder. There are some tiny low-attenuation lesion seen in the liver which are too small to completely characterize although possibly benign cysts. No specific imaging follow-up. These were seen on the prior in retrospect. Pancreas: Moderate global atrophy of the pancreas. No pancreatic mass. Spleen: Normal in size without focal abnormality. Adrenals/Urinary Tract: The adrenal glands are preserved. No obstructing renal stone or collecting system dilatation. The ureters have normal course and caliber extending down to the bladder. Preserved contour to the urinary bladder. Prominent bilateral renal sinus fat. Preserved adrenal glands. Stomach/Bowel: Stomach is within normal limits. Appendix appears normal. No evidence of bowel wall thickening, distention, or inflammatory  changes. Vascular/Lymphatic: Aortic atherosclerosis. No enlarged abdominal or pelvic lymph nodes. Reproductive: Uterus and bilateral adnexa are unremarkable. Other: No free air or free fluid. Musculoskeletal: Curvature of the spine with degenerative changes. Augmentation cement once again seen at L2. Degenerative changes of the pelvis. IMPRESSION: No bowel obstruction, free air or free fluid. Gallstones. No obstructing renal stone Moderate hiatal hernia. Electronically Signed   By: Karen Kays M.D.   On: 07/08/2022 15:21   DG Chest 2 View  Result Date: 07/08/2022 CLINICAL DATA:  Sepsis. EXAM: CHEST - 2 VIEW COMPARISON:  Chest x-ray dated August 15, 2019. FINDINGS: The heart size and mediastinal contours are within normal limits. Normal pulmonary vascularity. Chronic hyperinflation. No focal consolidation, pleural effusion, or pneumothorax. No acute osseous abnormality. IMPRESSION: 1. No acute cardiopulmonary disease. COPD. Electronically Signed   By: Obie Dredge M.D.   On: 07/08/2022 13:45    Assessment/Plan This is a pleasant 82 year old female with a history of essential tremor, chronic diarrhea, being admitted to the hospital with one month of abdominal discomfort and vomiting after oral intake.    Chronic vomiting-unclear etiology, does not sound like dysphagia or odynophagia.  Denies emesis of undigested food.  -Observation admission -Clear liquid diet -GI consult  Hypokalemia-due to GI losses from vomiting -Replete with KCl IV total 60 mEq -Check magnesium  Acute renal failure-likely ATN from dehydration due to reduced oral intake, and vomiting -Avoid nephrotoxins -Hydrate with normal saline -Follow renal function daily  Essential tremor-continue primidone 100 mg p.o. 3 times daily  DVT prophylaxis: Lovenox     Code Status: Full Code  Consults called: Gastroenterology  Admission status: Observation   Time spent: 43 minutes  Celestia Duva Sharlette Dense MD [facility] Pager  [phone]  If 7PM-7AM, please contact night-coverage [email or web address] Password [facility]  07/08/2022, 5:15 PM

## 2022-07-08 NOTE — ED Triage Notes (Signed)
Patient arrived from [place] with c/o vomiting and abd pain lasting one month,  patient reportedly has not had any solid foods except yesterday, when she had eggs, the rest of the time she has been eating ice pops

## 2022-07-08 NOTE — ED Provider Notes (Signed)
EMERGENCY DEPARTMENT AT [facility] Provider Note   CSN: [id number] Arrival date & time: 07/08/22  1103     History  Chief Complaint  Patient presents with   Emesis   Abdominal Pain    Brenda [name] is a 82 y.o. female presenting to ED with abdominal pain, vomiting, feeling fatigued.  Patient reports she has been feeling ill for about 1 month.  She says she was tried on antibiotics as well as steroids recently for possible respiratory infection, none of which have helped.  Several days ago she began having epigastric abdominal pain into her back that makes her feel very nauseated, very poor appetite.  She reports loose bowel movements.  He says she feels very weak  HPI     Home Medications Prior to Admission medications   Medication Sig Start Date End Date Taking? Authorizing Provider  ALPRAZolam (XANAX) 0.25 MG tablet Take 1 tablet (0.25 mg total) by mouth daily as needed (tremors). 08/23/19   Narda Bonds, MD  Apoaequorin (PREVAGEN PO) Take 1 capsule by mouth daily.    [provider]  atorvastatin (LIPITOR) 20 MG tablet Take 20 mg by mouth daily.    [provider]  Multiple Vitamins-Minerals (PRESERVISION AREDS 2) CAPS Take 1 capsule by mouth daily.    [provider]  Nutritional Supplements (,FEEDING SUPPLEMENT, PROSOURCE PLUS) liquid Take 30 mLs by mouth 3 (three) times daily between meals. 08/23/19   Narda Bonds, MD  primidone (MYSOLINE) 50 MG tablet Take 2 tablets (100 mg total) by mouth 3 (three) times daily. Must keep follow up 07/07/2020 for ongoing refills 06/04/20   Butch Penny, NP  promethazine (PHENERGAN) 25 MG tablet Take 25 mg by mouth 5 (five) times daily as needed for nausea or vomiting.  08/10/19   [provider]  Vitamin D, Ergocalciferol, (DRISDOL) 1.25 MG (50000 UNIT) CAPS capsule Take 50,000 Units by mouth every 7 (seven) days.    [provider]      Allergies    Diclofenac,  Macrodantin [nitrofurantoin macrocrystal], Tramadol, Codeine, and Topamax [topiramate]    Review of Systems   Review of Systems  Physical Exam Updated Vital Signs BP (!) 101/51 (BP Location: Right Arm)   Pulse 88   Temp 97.6 F (36.4 C) (Oral)   Resp 12   Ht 5\' 4"  (1.626 m)   Wt 49.9 kg   SpO2 100%   BMI 18.88 kg/m  Physical Exam Constitutional:      General: She is not in acute distress.    Comments: Thin, frail appearing  HENT:     Head: Normocephalic and atraumatic.  Eyes:     Conjunctiva/sclera: Conjunctivae normal.     Pupils: Pupils are equal, round, and reactive to light.  Cardiovascular:     Rate and Rhythm: Normal rate and regular rhythm.  Pulmonary:     Effort: Pulmonary effort is normal. No respiratory distress.  Abdominal:     General: There is no distension.     Tenderness: There is abdominal tenderness in the epigastric area.  Skin:    General: Skin is warm and dry.  Neurological:     General: No focal deficit present.     Mental Status: She is alert. Mental status is at baseline.  Psychiatric:        Mood and Affect: Mood normal.        Behavior: Behavior normal.     ED Results / Procedures / Treatments  Labs (all labs ordered are listed, but only abnormal results are displayed) Labs Reviewed  COMPREHENSIVE METABOLIC PANEL - Abnormal; Notable for the following components:      Result Value   Potassium 2.8 (*)    CO2 16 (*)    Glucose, Bld 112 (*)    BUN 60 (*)    Creatinine, Ser 1.76 (*)    Calcium 7.3 (*)    Total Protein 6.2 (*)    Albumin 3.1 (*)    AST 43 (*)    GFR, Estimated 29 (*)    All other components within normal limits  URINALYSIS, ROUTINE W REFLEX MICROSCOPIC - Abnormal; Notable for the following components:   Protein, ur 30 (*)    Bacteria, UA RARE (*)    All other components within normal limits  LIPASE, BLOOD - Abnormal; Notable for the following components:   Lipase 54 (*)    All other components within normal limits   CULTURE, BLOOD (ROUTINE X 2)  CULTURE, BLOOD (ROUTINE X 2)  LACTIC ACID, PLASMA  CBC WITH DIFFERENTIAL/PLATELET  PROTIME-INR  APTT    EKG EKG Interpretation  Date/Time:  Thursday July 08 2022 11:38:27 EDT Ventricular Rate:  85 PR Interval:  144 QRS Duration: 113 QT Interval:  415 QTC Calculation: 494 R Axis:   72 Text Interpretation: Sinus rhythm Borderline intraventricular conduction delay Borderline prolonged QT interval Confirmed by Alvester Chou [phone]) on 07/08/2022 1:29:49 PM  Radiology CT ABDOMEN PELVIS WO CONTRAST  Result Date: 07/08/2022 CLINICAL DATA:  Nonlocalized abdominal pain EXAM: CT ABDOMEN AND PELVIS WITHOUT CONTRAST TECHNIQUE: Multidetector CT imaging of the abdomen and pelvis was performed following the standard protocol without IV contrast. RADIATION DOSE REDUCTION: This exam was performed according to the departmental dose-optimization program which includes automated exposure control, adjustment of the mA and/or kV according to patient size and/or use of iterative reconstruction technique. COMPARISON:  Noncontrast CT 08/13/2019 and older. FINDINGS: Lower chest: There is some linear opacity lung bases likely scar or atelectasis. No pleural effusion. Some breathing motion. Moderate hiatal hernia. Coronary artery calcifications are seen. Hepatobiliary: Stones in the nondilated gallbladder. There are some tiny low-attenuation lesion seen in the liver which are too small to completely characterize although possibly benign cysts. No specific imaging follow-up. These were seen on the prior in retrospect. Pancreas: Moderate global atrophy of the pancreas. No pancreatic mass. Spleen: Normal in size without focal abnormality. Adrenals/Urinary Tract: The adrenal glands are preserved. No obstructing renal stone or collecting system dilatation. The ureters have normal course and caliber extending down to the bladder. Preserved contour to the urinary bladder. Prominent bilateral  renal sinus fat. Preserved adrenal glands. Stomach/Bowel: Stomach is within normal limits. Appendix appears normal. No evidence of bowel wall thickening, distention, or inflammatory changes. Vascular/Lymphatic: Aortic atherosclerosis. No enlarged abdominal or pelvic lymph nodes. Reproductive: Uterus and bilateral adnexa are unremarkable. Other: No free air or free fluid. Musculoskeletal: Curvature of the spine with degenerative changes. Augmentation cement once again seen at L2. Degenerative changes of the pelvis. IMPRESSION: No bowel obstruction, free air or free fluid. Gallstones. No obstructing renal stone Moderate hiatal hernia. Electronically Signed   By: Karen Kays M.D.   On: 07/08/2022 15:21   DG Chest 2 View  Result Date: 07/08/2022 CLINICAL DATA:  Sepsis. EXAM: CHEST - 2 VIEW COMPARISON:  Chest x-ray dated August 15, 2019. FINDINGS: The heart size and mediastinal contours are within normal limits. Normal pulmonary vascularity. Chronic hyperinflation. No focal consolidation, pleural effusion, or pneumothorax.  No acute osseous abnormality. IMPRESSION: 1. No acute cardiopulmonary disease. COPD. Electronically Signed   By: Obie Dredge M.D.   On: 07/08/2022 13:45    Procedures .Critical Care  Performed by: Terald Sleeper, MD Authorized by: Terald Sleeper, MD   Critical care provider statement:    Critical care time (minutes):  45   Critical care time was exclusive of:  Separately billable procedures and treating other patients   Critical care was necessary to treat or prevent imminent or life-threatening deterioration of the following conditions:  Metabolic crisis   Critical care was time spent personally by me on the following activities:  Ordering and performing treatments and interventions, ordering and review of laboratory studies, ordering and review of radiographic studies, pulse oximetry, review of old charts, examination of patient and evaluation of patient's response to  treatment Comments:     Potassium repletion for hypokalemia     Medications Ordered in ED Medications  sodium chloride 0.9 % bolus 1,000 mL (has no administration in time range)  famotidine (PEPCID) IVPB 20 mg premix (has no administration in time range)  lactated ringers bolus 1,000 mL (0 mLs Intravenous Stopped 07/08/22 1250)  potassium chloride 10 mEq in 100 mL IVPB (10 mEq Intravenous New Bag/Given 07/08/22 1505)  alum & mag hydroxide-simeth (MAALOX/MYLANTA) 200-200-20 MG/5ML suspension 30 mL (30 mLs Oral Given 07/08/22 1706)    ED Course/ Medical Decision Making/ A&P Clinical Course as of 07/08/22 1707  Thu Jul 08, 2022  1321 BP normalized with fluids, BUN/Cr elevation pattern is most consistent with prerenal injury for AKI, likely related to dehydration and poor oral intake. [MT]    Clinical Course User Index [MT] Aaralyn Kil, Kermit Balo, MD                             Medical Decision Making Amount and/or Complexity of Data Reviewed Labs: ordered. Radiology: ordered. ECG/medicine tests: ordered.  Risk OTC drugs. Prescription drug management. Decision regarding hospitalization.   This patient presents to the ED with concern for nausea, abdominal pain. This involves an extensive number of treatment options, and is a complaint that carries with it a high risk of complications and morbidity.  The differential diagnosis includes infection vs pancreatitis vs biliary disease vs ileus vs gastritis vs UTI vs other  Additional history obtained from EMS  I ordered and personally interpreted labs.  The pertinent results include:  K 2.8, Cr 1.76, BUN 60, UA with neg leuks and nitrites, WBC and hgb wnl; lactate wnl  I ordered imaging studies including dg chest, ct abdomen pelvis I independently visualized and interpreted imaging which showed no emergent findings explain the patient's symptoms I agree with the radiologist interpretation  The patient was maintained on a cardiac monitor.   I personally viewed and interpreted the cardiac monitored which showed an underlying rhythm of: NSR  Per my interpretation the patient's ECG shows no acute ischemic findings  I ordered medication including IV fluids, IV K I have reviewed the patients home medicines and have made adjustments as needed  Test Considered: doubt acute PE clinically   After the interventions noted above, I reevaluated the patient and found that they have: stayed the same   Dispostion:  After consideration of the diagnostic results and the patients response to treatment, I feel that the patent would benefit from medical admission.  Blood pressure did improve somewhat with fluids, MAP remains over 65.  She is  small and this may be physiological for habitus, but I suspect she is also volume down with her lab findings.  I have a much lower suspicion for PE or sepsis at this time.  I think she's stable for admission.         Final Clinical Impression(s) / ED Diagnoses Final diagnoses:  Hypotension, unspecified hypotension type  AKI (acute kidney injury) (HCC)  Epigastric pain  Hypokalemia    Rx / DC Orders ED Discharge Orders     None         Terald Sleeper, MD 07/08/22 [phone]

## 2022-07-08 NOTE — ED Notes (Signed)
Nurse made aware of low bp.

## 2022-07-09 DIAGNOSIS — Z79899 Other long term (current) drug therapy: Secondary | ICD-10-CM | POA: Diagnosis not present

## 2022-07-09 DIAGNOSIS — G250 Essential tremor: Secondary | ICD-10-CM | POA: Diagnosis present

## 2022-07-09 DIAGNOSIS — K449 Diaphragmatic hernia without obstruction or gangrene: Secondary | ICD-10-CM | POA: Diagnosis present

## 2022-07-09 DIAGNOSIS — Z801 Family history of malignant neoplasm of trachea, bronchus and lung: Secondary | ICD-10-CM | POA: Diagnosis not present

## 2022-07-09 DIAGNOSIS — K8020 Calculus of gallbladder without cholecystitis without obstruction: Secondary | ICD-10-CM | POA: Diagnosis present

## 2022-07-09 DIAGNOSIS — Z87891 Personal history of nicotine dependence: Secondary | ICD-10-CM | POA: Diagnosis not present

## 2022-07-09 DIAGNOSIS — Z825 Family history of asthma and other chronic lower respiratory diseases: Secondary | ICD-10-CM | POA: Diagnosis not present

## 2022-07-09 DIAGNOSIS — R109 Unspecified abdominal pain: Secondary | ICD-10-CM | POA: Diagnosis not present

## 2022-07-09 DIAGNOSIS — M810 Age-related osteoporosis without current pathological fracture: Secondary | ICD-10-CM | POA: Diagnosis present

## 2022-07-09 DIAGNOSIS — E8809 Other disorders of plasma-protein metabolism, not elsewhere classified: Secondary | ICD-10-CM | POA: Diagnosis present

## 2022-07-09 DIAGNOSIS — R54 Age-related physical debility: Secondary | ICD-10-CM | POA: Diagnosis present

## 2022-07-09 DIAGNOSIS — K2900 Acute gastritis without bleeding: Secondary | ICD-10-CM | POA: Diagnosis present

## 2022-07-09 DIAGNOSIS — E872 Acidosis, unspecified: Secondary | ICD-10-CM | POA: Diagnosis present

## 2022-07-09 DIAGNOSIS — K219 Gastro-esophageal reflux disease without esophagitis: Secondary | ICD-10-CM | POA: Diagnosis present

## 2022-07-09 DIAGNOSIS — K589 Irritable bowel syndrome without diarrhea: Secondary | ICD-10-CM | POA: Diagnosis present

## 2022-07-09 DIAGNOSIS — E7800 Pure hypercholesterolemia, unspecified: Secondary | ICD-10-CM | POA: Diagnosis present

## 2022-07-09 DIAGNOSIS — K8050 Calculus of bile duct without cholangitis or cholecystitis without obstruction: Secondary | ICD-10-CM | POA: Diagnosis not present

## 2022-07-09 DIAGNOSIS — K3189 Other diseases of stomach and duodenum: Secondary | ICD-10-CM | POA: Diagnosis present

## 2022-07-09 DIAGNOSIS — N170 Acute kidney failure with tubular necrosis: Secondary | ICD-10-CM | POA: Diagnosis present

## 2022-07-09 DIAGNOSIS — E876 Hypokalemia: Secondary | ICD-10-CM | POA: Diagnosis present

## 2022-07-09 DIAGNOSIS — E860 Dehydration: Secondary | ICD-10-CM | POA: Diagnosis present

## 2022-07-09 DIAGNOSIS — I959 Hypotension, unspecified: Secondary | ICD-10-CM | POA: Diagnosis present

## 2022-07-09 DIAGNOSIS — I10 Essential (primary) hypertension: Secondary | ICD-10-CM | POA: Diagnosis present

## 2022-07-09 DIAGNOSIS — F419 Anxiety disorder, unspecified: Secondary | ICD-10-CM | POA: Diagnosis not present

## 2022-07-09 DIAGNOSIS — D61818 Other pancytopenia: Secondary | ICD-10-CM | POA: Diagnosis present

## 2022-07-09 LAB — BLOOD GAS, VENOUS
Acid-base deficit: 10.7 mmol/L — ABNORMAL HIGH (ref 0.0–2.0)
Bicarbonate: 14.3 mmol/L — ABNORMAL LOW (ref 20.0–28.0)
Drawn by: 7020
O2 Saturation: 83.3 %
Patient temperature: 36.8
pCO2, Ven: 29 mmHg — ABNORMAL LOW (ref 44–60)
pH, Ven: 7.3 (ref 7.25–7.43)
pO2, Ven: 50 mmHg — ABNORMAL HIGH (ref 32–45)

## 2022-07-09 LAB — HEPATIC FUNCTION PANEL
ALT: 18 U/L (ref 0–44)
AST: 35 U/L (ref 15–41)
Albumin: 2.3 g/dL — ABNORMAL LOW (ref 3.5–5.0)
Alkaline Phosphatase: 62 U/L (ref 38–126)
Bilirubin, Direct: 0.1 mg/dL (ref 0.0–0.2)
Indirect Bilirubin: 0.6 mg/dL (ref 0.3–0.9)
Total Bilirubin: 0.7 mg/dL (ref 0.3–1.2)
Total Protein: 4.6 g/dL — ABNORMAL LOW (ref 6.5–8.1)

## 2022-07-09 LAB — CBC
HCT: 28.4 % — ABNORMAL LOW (ref 36.0–46.0)
Hemoglobin: 9.6 g/dL — ABNORMAL LOW (ref 12.0–15.0)
MCH: 33.8 pg (ref 26.0–34.0)
MCHC: 33.8 g/dL (ref 30.0–36.0)
MCV: 100 fL (ref 80.0–100.0)
Platelets: 108 10*3/uL — ABNORMAL LOW (ref 150–400)
RBC: 2.84 MIL/uL — ABNORMAL LOW (ref 3.87–5.11)
RDW: 13.6 % (ref 11.5–15.5)
WBC: 3.1 10*3/uL — ABNORMAL LOW (ref 4.0–10.5)
nRBC: 0 % (ref 0.0–0.2)

## 2022-07-09 LAB — BASIC METABOLIC PANEL
Anion gap: 6 (ref 5–15)
BUN: 32 mg/dL — ABNORMAL HIGH (ref 8–23)
CO2: 14 mmol/L — ABNORMAL LOW (ref 22–32)
Calcium: 6.4 mg/dL — CL (ref 8.9–10.3)
Chloride: 118 mmol/L — ABNORMAL HIGH (ref 98–111)
Creatinine, Ser: 1.07 mg/dL — ABNORMAL HIGH (ref 0.44–1.00)
GFR, Estimated: 53 mL/min — ABNORMAL LOW (ref >60)
Glucose, Bld: 89 mg/dL (ref 70–99)
Potassium: 3.6 mmol/L (ref 3.5–5.1)
Sodium: 138 mmol/L (ref 135–145)

## 2022-07-09 LAB — CULTURE, BLOOD (ROUTINE X 2): Culture: NO GROWTH

## 2022-07-09 LAB — VITAMIN D 25 HYDROXY (VIT D DEFICIENCY, FRACTURES): Vit D, 25-Hydroxy: 46.01 ng/mL (ref 30–100)

## 2022-07-09 LAB — PHOSPHORUS: Phosphorus: 1.4 mg/dL — ABNORMAL LOW (ref 2.5–4.6)

## 2022-07-09 LAB — MAGNESIUM: Magnesium: 1.7 mg/dL (ref 1.7–2.4)

## 2022-07-09 MED ORDER — POTASSIUM CHLORIDE IN NACL 40-0.9 MEQ/L-% IV SOLN
INTRAVENOUS | Status: DC
  Filled 2022-07-09 (×4): qty 1000

## 2022-07-09 MED ORDER — SODIUM BICARBONATE 650 MG PO TABS
650.0000 mg | ORAL_TABLET | Freq: Two times a day (BID) | ORAL | Status: DC
  Administered 2022-07-09 – 2022-07-14 (×10): 650 mg via ORAL
  Filled 2022-07-09 (×10): qty 1

## 2022-07-09 MED ORDER — MAGNESIUM SULFATE 2 GM/50ML IV SOLN
2.0000 g | Freq: Once | INTRAVENOUS | Status: AC
  Administered 2022-07-09: 2 g via INTRAVENOUS
  Filled 2022-07-09: qty 50

## 2022-07-09 MED ORDER — SODIUM PHOSPHATES 45 MMOLE/15ML IV SOLN
30.0000 mmol | Freq: Once | INTRAVENOUS | Status: AC
  Administered 2022-07-09: 30 mmol via INTRAVENOUS
  Filled 2022-07-09: qty 10

## 2022-07-09 MED ORDER — PANTOPRAZOLE SODIUM 40 MG IV SOLR
40.0000 mg | INTRAVENOUS | Status: DC
  Administered 2022-07-09 – 2022-07-12 (×4): 40 mg via INTRAVENOUS
  Filled 2022-07-09 (×4): qty 10

## 2022-07-09 NOTE — H&P (View-Only) (Signed)
[facility] Consultation Note  Referring Provider: [facility] Primary Care Physician:  Clovis Riley, L.August Saucer, MD Primary Gastroenterologist:  Dr. Dulce Sellar  Reason for Consultation:  abdominal pain, nausea, vomiting  HPI: Brenda [name] is a 82 y.o. female admitted above symptoms.  For the past month, she has had progressively worsening upper abdominal pain and nausea and vomiting.  Symptoms occur almost exclusively after eating.  She can have upper abdominal pain and radiation of pain to her shoulders and back.  Subjective weight loss, amount unclear.  No hematemesis, melena, hematochezia.  Has history of GERD and Hiatal hernia.  Endoscopy 2019 with Dr. Evette Cristal showed abnormal esophageal motility (biopsies esophagus reflux changes), moderate hiatal hernia, gastritis, signs of prior Nissen fundoplication.   Past Medical History:  Diagnosis Date   Anxiety    Chronic diarrhea    Coarse tremors    Colon polyp    DDD (degenerative disc disease), cervical    Hiatal hernia    History of skin cancer    Hypercholesteremia    Hypertension    Migraines    Osteoporosis    Shortness of breath dyspnea    with exertion   Tremor    Weight loss     Past Surgical History:  Procedure Laterality Date   ESOPHAGEAL MANOMETRY N/A 01/15/2015   Procedure: ESOPHAGEAL MANOMETRY (EM);  Surgeon: Graylin Shiver, MD;  Location: [facility];  Service: Endoscopy;  Laterality: N/A;   HIATAL HERNIA REPAIR     KYPHOPLASTY N/A 12/22/2017   Procedure: LUMBAR TWO KYPHOPLASTY;  Surgeon: Estill Bamberg, MD;  Location: [facility] OR;  Service: Orthopedics;  Laterality: N/A;  LUMBAR TWO KYPHOPLASTY   RHINOPLASTY     SINUS SURGERY WITH INSTATRAK      Prior to Admission medications   Medication Sig Start Date End Date Taking? Authorizing Provider  ALPRAZolam (XANAX) 0.25 MG tablet Take 1 tablet (0.25 mg total) by mouth daily as needed (tremors). 08/23/19  Yes Narda Bonds, MD  Apoaequorin (PREVAGEN PO) Take 1  capsule by mouth daily.   Yes [provider]  atorvastatin (LIPITOR) 20 MG tablet Take 20 mg by mouth daily.   Yes [provider]  budesonide (ENTOCORT EC) 3 MG 24 hr capsule Take 9 mg by mouth in the morning.   Yes [provider]  DULoxetine (CYMBALTA) 20 MG capsule Take 20 mg by mouth daily.   Yes [provider]  furosemide (LASIX) 20 MG tablet Take 20 mg by mouth in the morning.   Yes [provider]  lisinopril (ZESTRIL) 10 MG tablet Take 10 mg by mouth daily.   Yes [provider]  loperamide (IMODIUM A-D) 2 MG tablet Take 4-6 mg by mouth See admin instructions. Take 6 mg by mouth in the morning and an additional 4 mg with each subsequent loose stool   Yes [provider]  Multiple Vitamins-Minerals (PRESERVISION AREDS 2) CAPS Take 1 capsule by mouth daily.   Yes [provider]  primidone (MYSOLINE) 50 MG tablet Take 2 tablets (100 mg total) by mouth 3 (three) times daily. Must keep follow up 07/07/2020 for ongoing refills Patient taking differently: Take 100 mg by mouth in the morning, at noon, and at bedtime. 06/04/20  Yes Butch Penny, NP  Vitamin D, Ergocalciferol, (DRISDOL) 1.25 MG (50000 UNIT) CAPS capsule Take 50,000 Units by mouth every Monday.   Yes [provider]  Nutritional Supplements (,FEEDING SUPPLEMENT, PROSOURCE PLUS) liquid Take 30 mLs by mouth 3 (three) times daily  between meals. Patient not taking: Reported on 07/08/2022 08/23/19   Narda Bonds, MD  promethazine (PHENERGAN) 25 MG tablet Take 25 mg by mouth 5 (five) times daily as needed for nausea or vomiting.  08/10/19   [provider]    Current Facility-Administered Medications  Medication Dose Route Frequency Provider Last Rate Last Admin   0.9 %  sodium chloride infusion   Intravenous Continuous Kirby Crigler, Mir M, MD 100 mL/hr at 07/09/22 0622 New Bag at 07/09/22 0622   acetaminophen (TYLENOL) tablet 650 mg  650 mg Oral  Q6H PRN Maryln Gottron, MD       Or   acetaminophen (TYLENOL) suppository 650 mg  650 mg Rectal Q6H PRN Kirby Crigler, Mir M, MD       albuterol (PROVENTIL) (2.5 MG/3ML) 0.083% nebulizer solution 2.5 mg  2.5 mg Nebulization Q2H PRN Kirby Crigler, Mir M, MD       atorvastatin (LIPITOR) tablet 20 mg  20 mg Oral Daily Kirby Crigler, Mir M, MD   20 mg at 07/09/22 4010   enoxaparin (LOVENOX) injection 30 mg  30 mg Subcutaneous Q24H Kirby Crigler, Mir M, MD   30 mg at 07/08/22 2106   ondansetron (ZOFRAN) tablet 4 mg  4 mg Oral Q6H PRN Kirby Crigler, Mir M, MD       Or   ondansetron [facility]) injection 4 mg  4 mg Intravenous Q6H PRN Kirby Crigler, Mir M, MD       primidone (MYSOLINE) tablet 100 mg  100 mg Oral TID Kirby Crigler, Mir M, MD   100 mg at 07/09/22 2725   traZODone (DESYREL) tablet 25 mg  25 mg Oral QHS PRN Maryln Gottron, MD        Allergies as of 07/08/2022 - Review Complete 07/08/2022  Allergen Reaction Noted   Macrodantin [nitrofurantoin macrocrystal] Shortness Of Breath, Itching, Nausea And Vomiting, Swelling, and Rash 09/09/2012   Topamax [topiramate] Other (See Comments) 06/03/2015   Diclofenac Other (See Comments) 09/08/2015   Tramadol Other (See Comments) 09/08/2015   Rice Nausea And Vomiting 07/08/2022   Rosemary oil Nausea And Vomiting 07/08/2022   Codeine Nausea And Vomiting 09/09/2012    Family History  Problem Relation Age of Onset   Lung cancer Mother    COPD Father    Tremor Father    Hypertension Brother    Tremor Brother     Social History   Socioeconomic History   Marital status: Married    Spouse name: Not on file   Number of children: 0   Years of education: MA   Highest education level: Not on file  Occupational History   Occupation: Retired  Tobacco Use   Smoking status: Former    Types: Cigarettes    Quit date: 09/11/1988    Years since quitting: 33.8   Smokeless tobacco: Never  Vaping Use   Vaping Use: Never used  Substance and Sexual Activity   Alcohol  use: Yes    Comment: 2 drinks at night   Drug use: No   Sexual activity: Not on file  Other Topics Concern   Not on file  Social History Narrative   Denies any caffeine use    Retired    Left hnaded   Social Determinants of Health   Financial Resource Strain: Not on file  Food Insecurity: No Food Insecurity (07/08/2022)   Hunger Vital Sign    Worried About Running Out of Food in the Last Year: Never true    Ran Out of Food in  the Last Year: Never true  Transportation Needs: No Transportation Needs (07/08/2022)   PRAPARE - Administrator, Civil Service (Medical): No    Lack of Transportation (Non-Medical): No  Physical Activity: Not on file  Stress: Not on file  Social Connections: Not on file  Intimate Partner Violence: Not At Risk (07/08/2022)   Humiliation, Afraid, Rape, and Kick questionnaire    Fear of Current or Ex-Partner: No    Emotionally Abused: No    Physically Abused: No    Sexually Abused: No    Review of Systems: As per HPI, all others negative  Physical Exam: Vital signs in last 24 hours: Temp:  [97.5 F (36.4 C)-98.2 F (36.8 C)] 97.8 F (36.6 C) (06/21 0531) Pulse Rate:  [55-172] 80 (06/21 0531) Resp:  [10-20] 16 (06/21 0531) BP: (87-126)/(40-99) 108/53 (06/21 0531) SpO2:  [55 %-100 %] 99 % (06/21 0531) Last BM Date : 07/08/22 General:   Alert,  thin and somewhat frail-appearing, but non-toxic, cooperative in NAD Head:  Normocephalic and atraumatic. Eyes:  Sclera clear, no icterus.   Conjunctiva pink. Ears:  Normal auditory acuity. Nose:  No deformity, discharge,  or lesions. Mouth:  No deformity or lesions.  Oropharynx pink & moist. Neck:  Supple; no masses or thyromegaly. Lungs:  No respiratory distress Abdomen:  Soft, nontender and nondistended. No masses, hepatosplenomegaly or hernias noted. Normal bowel sounds, without guarding, and without rebound.     Msk:  Symmetrical without gross deformities. Normal posture. Pulses:  Normal  pulses noted. Extremities:  Without clubbing or edema. Neurologic:  Alert and  oriented x4;  grossly normal neurologically. Skin:  Intact without significant lesions or rashes. Psych:  Alert and cooperative. Normal mood and affect.   Lab Results: Recent Labs    07/08/22 1204 07/08/22 2059 07/09/22 0434  WBC 5.3 3.3* 3.1*  HGB 12.8 10.4* 9.6*  HCT 38.1 30.6* 28.4*  PLT 156 118* 108*   BMET Recent Labs    07/08/22 1204 07/08/22 2059 07/09/22 0434  NA 135  --  138  K 2.8*  --  3.6  CL 110  --  118*  CO2 16*  --  14*  GLUCOSE 112*  --  89  BUN 60*  --  32*  CREATININE 1.76* 1.23* 1.07*  CALCIUM 7.3*  --  6.4*   LFT Recent Labs    07/09/22 0434  PROT 4.6*  ALBUMIN 2.3*  AST 35  ALT 18  ALKPHOS 62  BILITOT 0.7  BILIDIR 0.1  IBILI 0.6   PT/INR Recent Labs    07/08/22 1204  LABPROT 14.7  INR 1.1    Studies/Results: CT ABDOMEN PELVIS WO CONTRAST  Result Date: 07/08/2022 CLINICAL DATA:  Nonlocalized abdominal pain EXAM: CT ABDOMEN AND PELVIS WITHOUT CONTRAST TECHNIQUE: Multidetector CT imaging of the abdomen and pelvis was performed following the standard protocol without IV contrast. RADIATION DOSE REDUCTION: This exam was performed according to the departmental dose-optimization program which includes automated exposure control, adjustment of the mA and/or kV according to patient size and/or use of iterative reconstruction technique. COMPARISON:  Noncontrast CT 08/13/2019 and older. FINDINGS: Lower chest: There is some linear opacity lung bases likely scar or atelectasis. No pleural effusion. Some breathing motion. Moderate hiatal hernia. Coronary artery calcifications are seen. Hepatobiliary: Stones in the nondilated gallbladder. There are some tiny low-attenuation lesion seen in the liver which are too small to completely characterize although possibly benign cysts. No specific imaging follow-up. These were seen on the  prior in retrospect. Pancreas: Moderate global  atrophy of the pancreas. No pancreatic mass. Spleen: Normal in size without focal abnormality. Adrenals/Urinary Tract: The adrenal glands are preserved. No obstructing renal stone or collecting system dilatation. The ureters have normal course and caliber extending down to the bladder. Preserved contour to the urinary bladder. Prominent bilateral renal sinus fat. Preserved adrenal glands. Stomach/Bowel: Stomach is within normal limits. Appendix appears normal. No evidence of bowel wall thickening, distention, or inflammatory changes. Vascular/Lymphatic: Aortic atherosclerosis. No enlarged abdominal or pelvic lymph nodes. Reproductive: Uterus and bilateral adnexa are unremarkable. Other: No free air or free fluid. Musculoskeletal: Curvature of the spine with degenerative changes. Augmentation cement once again seen at L2. Degenerative changes of the pelvis. IMPRESSION: No bowel obstruction, free air or free fluid. Gallstones. No obstructing renal stone Moderate hiatal hernia. Electronically Signed   By: Karen Kays M.D.   On: 07/08/2022 15:21   DG Chest 2 View  Result Date: 07/08/2022 CLINICAL DATA:  Sepsis. EXAM: CHEST - 2 VIEW COMPARISON:  Chest x-ray dated August 15, 2019. FINDINGS: The heart size and mediastinal contours are within normal limits. Normal pulmonary vascularity. Chronic hyperinflation. No focal consolidation, pleural effusion, or pneumothorax. No acute osseous abnormality. IMPRESSION: 1. No acute cardiopulmonary disease. COPD. Electronically Signed   By: Obie Dredge M.D.   On: 07/08/2022 13:45    Impression:   Abdominal pain. Nausea/vomiting. Gallstones on imaging studies. Overall constellation of symptoms most consistent with biliary colic. Pancytopenia, mild, worsening in hospital, (at least in part) medication related?  Plan:   IVF, judicious analgesics, antiemetics. Clear liquid diet, NPO after midnight. PPI. Endoscopy tomorrow. Risks (bleeding, infection, bowel  perforation that could require surgery, sedation-related changes in cardiopulmonary systems), benefits (identification and possible treatment of source of symptoms, exclusion of certain causes of symptoms), and alternatives (watchful waiting, radiographic imaging studies, empiric medical treatment) of upper endoscopy (EGD) were explained to patient/family in detail and patient wishes to proceed.  If endoscopy unrevealing for compelling cause of symptoms, then would consult surgery for consideration of cholecystectomy.   LOS: 0 days   Ned Kakar M  07/09/2022, 11:40 AM  Cell [phone] If no answer or after 5 PM call [phone]

## 2022-07-09 NOTE — TOC Initial Note (Signed)
Transition of Care [facility]) - Initial/Assessment Note    Patient Details  Name: Brenda [name] MRN: [id number] Date of Birth: November 25, 1940  Transition of Care [facility]) CM/SW Contact:    Larrie Kass, LCSW Phone Number: 07/09/2022, 2:25 PM  Clinical Narrative:                 Pt is from [facility]. TOC to follow for d/c needs.  Expected Discharge Plan:  (TBD) Barriers to Discharge: Continued Medical Work up   Patient Goals and CMS Choice Patient states their goals for this hospitalization and ongoing recovery are:: retrun home          Expected Discharge Plan and Services       Living arrangements for the past 2 months: Independent Living Facility                                      Prior Living Arrangements/Services Living arrangements for the past 2 months: Independent Living Facility Lives with:: Self, Spouse Patient language and need for interpreter reviewed:: Yes Do you feel safe going back to the place where you live?: Yes      Need for Family Participation in Patient Care: No (Comment) Care giver support system in place?: No (comment)   Criminal Activity/Legal Involvement Pertinent to Current Situation/Hospitalization: No - Comment as needed  Activities of Daily Living Home Assistive Devices/Equipment: None ADL Screening (condition at time of admission) Patient's cognitive ability adequate to safely complete daily activities?: Yes Is the patient deaf or have difficulty hearing?: No Does the patient have difficulty seeing, even when wearing glasses/contacts?: No Does the patient have difficulty concentrating, remembering, or making decisions?: No Patient able to express need for assistance with ADLs?: Yes Does the patient have difficulty dressing or bathing?: No Independently performs ADLs?: No Communication: Independent Dressing (OT): Needs assistance Is this a change from baseline?: Change from baseline, expected  to last <3days Grooming: Needs assistance Is this a change from baseline?: Change from baseline, expected to last <3 days Feeding: Independent Bathing: Needs assistance Is this a change from baseline?: Change from baseline, expected to last <3 days Toileting: Needs assistance Is this a change from baseline?: Change from baseline, expected to last <3 days In/Out Bed: Needs assistance Is this a change from baseline?: Change from baseline, expected to last <3 days Walks in Home: Independent Does the patient have difficulty walking or climbing stairs?: Yes Weakness of Legs: Both Weakness of Arms/Hands: None (tremors)  Permission Sought/Granted                  Emotional Assessment              Admission diagnosis:  Hypokalemia [E87.6] Epigastric pain [R10.13] AKI (acute kidney injury) (HCC) [N17.9] Hypotension [I95.9] Hypotension, unspecified hypotension type [I95.9] Abdominal pain [R10.9] Patient Active Problem List   Diagnosis Date Noted   Abdominal pain 07/09/2022   Protein-calorie malnutrition, severe 08/15/2019   Dehydration    Diarrhea    Shock circulatory (HCC)    AKI (acute kidney injury) (HCC) 08/13/2019   Hiatal hernia with obstruction but no gangrene 08/13/2019   Nausea vomiting and diarrhea 08/13/2019   Volume depletion 08/13/2019   Hypotension 08/13/2019   Enteritis 08/13/2019   Volume depletion, gastrointestinal loss 08/13/2019   Paraesophageal hiatal hernia s/p repair w biologic mesh 03/26/2015 03/27/2015   GERD (gastroesophageal reflux disease) s/p  Nissen fundoplication 03/26/2015 03/26/2015   PCP:  Clovis Riley, L.August Saucer, MD Pharmacy:   [facility] [phone] - [place], [place] - [address] AT [address] OF [facility] [address] [place] [address] Phone: [phone] Fax: [phone]     Social Determinants of Health (SDOH) Social History: SDOH Screenings   Food Insecurity: No Food Insecurity (07/08/2022)  Housing: Low  Risk  (07/08/2022)  Transportation Needs: No Transportation Needs (07/08/2022)  Utilities: Not At Risk (07/08/2022)  Tobacco Use: Medium Risk (07/08/2022)   SDOH Interventions:     Readmission Risk Interventions     No data to display

## 2022-07-09 NOTE — Plan of Care (Signed)

## 2022-07-09 NOTE — Progress Notes (Signed)
PROGRESS NOTE    SHAYLEEN EPPINGER  ZOX:[id number] DOB: [birth year] DOA: 07/08/2022 PCP: Clovis Riley, L.August Saucer, MD    Brief Narrative:   Brenda [name] is a 82 y.o. female with past medical history significant for irritable bowel syndrome, chronic diarrhea, essential tremor, HTN, HLD, GERD, hiatal hernia s/p Nissen fundoplication 2017, anxiety/depression who presented to [facility] ED on 6/20 from [facility] nursing facility complaining of nausea, vomiting, abdominal pain ongoing and progressive over the last month.  Patient denies dysphagia, trouble with solids as she is able to initially tolerate but vomits thereafter.  She also reports subjective weight loss but denies hematemesis, no melena, no hematochezia.  Is followed by gastroenterology outpatient, and has been diagnosed with IBS, chronic diarrhea, and abnormal esophageal motility.  Patient currently uses budesonide, previously used cholestyramine but did tolerate the "powder".  In the ED, temperature 97.5 F, HR 82, RR 18, BP 92/46, SpO2 100% on room air.  WBC 5.3, hemoglobin 12.8, platelets 156.  Sodium 135, potassium 2.8, chloride 110, CO2 16, glucose 112, BUN 60, creatinine 1.76.  AST 43, ALT 23, total bilirubin 0.7.  Calcium 7.3.  Albumin 3.1.  Urinalysis unrevealing.  Chest x-ray with no acute cardiopulmonary disease process, noted chronic hyperinflation related to COPD.  Lactic acid 1.0.  Patient was given potassium replacement and TRH consulted for admission for further evaluation and management.  Assessment & Plan:   Abdominal pain associate with nausea/vomiting Hx hiatal hernia s/p Nissen fundoplication Patient presenting with progressive abdominal pain associate with nausea/vomiting, especially after eating meals.  Endorses associated subjective weight loss.  Has longstanding history of IBS, chronic diarrhea, history of hiatal hernia s/p Nissen fundoplication.  Follows with [facility] outpatient.  EGD 2019 notable for abnormal  esophageal motility, moderate hiatal hernia, gastritis.  GI concerned about symptoms consistent with biliary colic. -- [facility] following, appreciate assistance --Protonix 40 mg IV every 24 hours -- Clear liquid diet -- N.p.o. after midnight for planned endoscopy tomorrow -- If endoscopy unrevealing, GI recommends surgery consult for consideration of cholecystectomy  Hypokalemia Etiology likely secondary to GI loss from nausea/vomiting, underlying chronic diarrhea.  Repleted. -- K 2.8>3.6 -- NS w/ 40 mEq Kcl at 75 mL/h -- Repeat electrolytes in a.m.  Hypocalcemia Calcium 6.4, corrected for hypoalbuminemia of 2.3 = 7.8.  Magnesium 1.7, vitamin D 25-hydroxy level within normal limits. -- Ionized calcium: Pending -- Intact PTH: Pending -- Repeat BMP in a.m.  Hypophosphatemia Phosphorus 1.4, will replete with 30 mmol sodium phosphate IV -- Repeat electrolytes in a.m.  Essential hypertension Patient with slight hypotension on admission, likely secondary to volume depletion. -- Hold home  antihypertensives -- IV fluid hydration  Hyperlipidemia -- Atorvastatin 20 mg p.o. daily  Essential tremor Follows with neurology outpatient, Dr. Arbutus Leas. -- Continue primidone 100 mg p.o. 3 times daily  DVT prophylaxis: enoxaparin (LOVENOX) injection 30 mg Start: 07/08/22 1800 SCDs Start: 07/08/22 1714    Code Status: Full Code Family Communication:   Disposition Plan:  Level of care: Progressive Status is: Observation The patient remains OBS appropriate and will d/c before 2 midnights.    Consultants:  [facility]  Procedures:  none  Antimicrobials:  none    Subjective: Patient seen examined bedside, resting comfortably.  Continues with mild abdominal discomfort.  Patient states "I been dealing with some sort of GI symptoms for 20+ years".  "I just want this to go away".  Seen by GI this morning with plans for endoscopy tomorrow.  Patient with no  other complaints or concerns at this  time.  Denies headache, no chest pain, no palpitations, no shortness of breath, no fever/chills/night sweats, no cough/congestion, no focal weakness, no fatigue, no paresthesias.  No acute events overnight per nursing staff.  Objective: Vitals:   07/08/22 1845 07/08/22 2134 07/09/22 0124 07/09/22 0531  BP: 111/86 (!) 104/57 (!) 122/97 (!) 108/53  Pulse: 72 86 89 80  Resp: 18 20 17 16   Temp:  97.6 F (36.4 C) 98.2 F (36.8 C) 97.8 F (36.6 C)  TempSrc:  Oral Oral Oral  SpO2: 99% 99% 98% 99%  Weight:      Height:        Intake/Output Summary (Last 24 hours) at 07/09/2022 1309 Last data filed at 07/09/2022 9753 Gross per 24 hour  Intake 1457.24 ml  Output 2 ml  Net 1455.24 ml   Filed Weights   07/08/22 1115  Weight: 49.9 kg    Examination:  Physical Exam: GEN: NAD, alert and oriented x 3, elderly/chronically ill appearance HEENT: NCAT, PERRL, EOMI, sclera clear, MMM PULM: CTAB w/o wheezes/crackles, normal respiratory effort, on room air CV: RRR w/o M/G/R GI: abd soft, NTND, NABS, no R/G/M MSK: no peripheral edema, muscle strength globally intact 5/5 bilateral upper/lower extremities NEURO: CN II-XII intact, no focal deficits, sensation to light touch intact PSYCH: normal mood/affect Integumentary: dry/intact, no rashes or wounds    Data Reviewed: I have personally reviewed following labs and imaging studies  CBC: Recent Labs  Lab 07/08/22 1204 07/08/22 2059 07/09/22 0434  WBC 5.3 3.3* 3.1*  NEUTROABS 2.7  --   --   HGB 12.8 10.4* 9.6*  HCT 38.1 30.6* 28.4*  MCV 98.4 99.4 100.0  PLT 156 118* 108*   Basic Metabolic Panel: Recent Labs  Lab 07/08/22 1204 07/08/22 2059 07/09/22 0434 07/09/22 0948  NA 135  --  138  --   K 2.8*  --  3.6  --   CL 110  --  118*  --   CO2 16*  --  14*  --   GLUCOSE 112*  --  89  --   BUN 60*  --  32*  --   CREATININE 1.76* 1.23* 1.07*  --   CALCIUM 7.3*  --  6.4*  --   MG  --  1.8  --  1.7  PHOS  --   --   --  1.4*    GFR: Estimated Creatinine Clearance: 33 mL/min (A) (by C-G formula based on SCr of 1.07 mg/dL (H)). Liver Function Tests: Recent Labs  Lab 07/08/22 1204 07/09/22 0434  AST 43* 35  ALT 23 18  ALKPHOS 85 62  BILITOT 0.7 0.7  PROT 6.2* 4.6*  ALBUMIN 3.1* 2.3*   Recent Labs  Lab 07/08/22 1312  LIPASE 54*   No results for input(s): "AMMONIA" in the last 168 hours. Coagulation Profile: Recent Labs  Lab 07/08/22 1204  INR 1.1   Cardiac Enzymes: No results for input(s): "CKTOTAL", "CKMB", "CKMBINDEX", "TROPONINI" in the last 168 hours. BNP (last 3 results) No results for input(s): "PROBNP" in the last 8760 hours. HbA1C: No results for input(s): "HGBA1C" in the last 72 hours. CBG: No results for input(s): "GLUCAP" in the last 168 hours. Lipid Profile: No results for input(s): "CHOL", "HDL", "LDLCALC", "TRIG", "CHOLHDL", "LDLDIRECT" in the last 72 hours. Thyroid Function Tests: No results for input(s): "TSH", "T4TOTAL", "FREET4", "T3FREE", "THYROIDAB" in the last 72 hours. Anemia Panel: No results for input(s): "VITAMINB12", "FOLATE", "FERRITIN", "TIBC", "IRON", "  RETICCTPCT" in the last 72 hours. Sepsis Labs: Recent Labs  Lab 07/08/22 1204  LATICACIDVEN 1.0    Recent Results (from the past 240 hour(s))  Blood Culture (routine x 2)     Status: None (Preliminary result)   Collection Time: 07/08/22 12:06 PM   Specimen: BLOOD LEFT ARM  Result Value Ref Range Status   Specimen Description   Final    BLOOD LEFT ARM Performed at [facility], [address], [place], [place] [address]    Special Requests   Final    BOTTLES DRAWN AEROBIC AND ANAEROBIC Blood Culture adequate volume Performed at [facility], [address], [place], [place] [address]    Culture   Final    NO GROWTH < 24 HOURS Performed at [facility], [address] [place], [place] [address]    Report Status PENDING  Incomplete  Blood Culture (routine x  2)     Status: None (Preliminary result)   Collection Time: 07/08/22 12:13 PM   Specimen: BLOOD LEFT FOREARM  Result Value Ref Range Status   Specimen Description   Final    BLOOD LEFT FOREARM Performed at [facility], [address] [place], [place] [address]    Special Requests   Final    BOTTLES DRAWN AEROBIC AND ANAEROBIC Blood Culture adequate volume Performed at [facility], [address], [place], [place] [address]    Culture   Final    NO GROWTH < 24 HOURS Performed at [facility], [address], [place], [place] [address]    Report Status PENDING  Incomplete         Radiology Studies: CT ABDOMEN PELVIS WO CONTRAST  Result Date: 07/08/2022 CLINICAL DATA:  Nonlocalized abdominal pain EXAM: CT ABDOMEN AND PELVIS WITHOUT CONTRAST TECHNIQUE: Multidetector CT imaging of the abdomen and pelvis was performed following the standard protocol without IV contrast. RADIATION DOSE REDUCTION: This exam was performed according to the departmental dose-optimization program which includes automated exposure control, adjustment of the mA and/or kV according to patient size and/or use of iterative reconstruction technique. COMPARISON:  Noncontrast CT 08/13/2019 and older. FINDINGS: Lower chest: There is some linear opacity lung bases likely scar or atelectasis. No pleural effusion. Some breathing motion. Moderate hiatal hernia. Coronary artery calcifications are seen. Hepatobiliary: Stones in the nondilated gallbladder. There are some tiny low-attenuation lesion seen in the liver which are too small to completely characterize although possibly benign cysts. No specific imaging follow-up. These were seen on the prior in retrospect. Pancreas: Moderate global atrophy of the pancreas. No pancreatic mass. Spleen: Normal in size without focal abnormality. Adrenals/Urinary Tract: The adrenal glands are preserved. No obstructing renal stone or collecting  system dilatation. The ureters have normal course and caliber extending down to the bladder. Preserved contour to the urinary bladder. Prominent bilateral renal sinus fat. Preserved adrenal glands. Stomach/Bowel: Stomach is within normal limits. Appendix appears normal. No evidence of bowel wall thickening, distention, or inflammatory changes. Vascular/Lymphatic: Aortic atherosclerosis. No enlarged abdominal or pelvic lymph nodes. Reproductive: Uterus and bilateral adnexa are unremarkable. Other: No free air or free fluid. Musculoskeletal: Curvature of the spine with degenerative changes. Augmentation cement once again seen at L2. Degenerative changes of the pelvis. IMPRESSION: No bowel obstruction, free air or free fluid. Gallstones. No obstructing renal stone Moderate hiatal hernia. Electronically Signed   By: Karen Kays M.D.   On: 07/08/2022 15:21   DG Chest 2  View  Result Date: 07/08/2022 CLINICAL DATA:  Sepsis. EXAM: CHEST - 2 VIEW COMPARISON:  Chest x-ray dated August 15, 2019. FINDINGS: The heart size and mediastinal contours are within normal limits. Normal pulmonary vascularity. Chronic hyperinflation. No focal consolidation, pleural effusion, or pneumothorax. No acute osseous abnormality. IMPRESSION: 1. No acute cardiopulmonary disease. COPD. Electronically Signed   By: Obie Dredge M.D.   On: 07/08/2022 13:45        Scheduled Meds:  atorvastatin  20 mg Oral Daily   enoxaparin (LOVENOX) injection  30 mg Subcutaneous Q24H   primidone  100 mg Oral TID   Continuous Infusions:  sodium chloride 100 mL/hr at 07/09/22 0622     LOS: 0 days    Time spent: 52 minutes spent on chart review, discussion with nursing staff, consultants, updating family and interview/physical exam; more than 50% of that time was spent in counseling and/or coordination of care.    Alvira Philips [place], DO [facility] Available via Epic secure chat 7am-7pm After these hours, please refer to coverage  provider listed on [email or web address] 07/09/2022, 1:09 PM

## 2022-07-09 NOTE — Consult Note (Signed)
[facility] Consultation Note  Referring Provider: [facility] Primary Care Physician:  Mitchell, L.Dean, MD Primary Gastroenterologist:  Dr. Loriel Diehl  Reason for Consultation:  abdominal pain, nausea, vomiting  HPI: Brenda [name] is a 81 y.o. female admitted above symptoms.  For the past month, she has had progressively worsening upper abdominal pain and nausea and vomiting.  Symptoms occur almost exclusively after eating.  She can have upper abdominal pain and radiation of pain to her shoulders and back.  Subjective weight loss, amount unclear.  No hematemesis, melena, hematochezia.  Has history of GERD and Hiatal hernia.  Endoscopy 2019 with Dr. Ganem showed abnormal esophageal motility (biopsies esophagus reflux changes), moderate hiatal hernia, gastritis, signs of prior Nissen fundoplication.   Past Medical History:  Diagnosis Date   Anxiety    Chronic diarrhea    Coarse tremors    Colon polyp    DDD (degenerative disc disease), cervical    Hiatal hernia    History of skin cancer    Hypercholesteremia    Hypertension    Migraines    Osteoporosis    Shortness of breath dyspnea    with exertion   Tremor    Weight loss     Past Surgical History:  Procedure Laterality Date   ESOPHAGEAL MANOMETRY N/A 01/15/2015   Procedure: ESOPHAGEAL MANOMETRY (EM);  Surgeon: Salem F Ganem, MD;  Location: [facility]COPY;  Service: Endoscopy;  Laterality: N/A;   HIATAL HERNIA REPAIR     KYPHOPLASTY N/A 12/22/2017   Procedure: LUMBAR TWO KYPHOPLASTY;  Surgeon: Dumonski, Mark, MD;  Location: [facility] OR;  Service: Orthopedics;  Laterality: N/A;  LUMBAR TWO KYPHOPLASTY   RHINOPLASTY     SINUS SURGERY WITH INSTATRAK      Prior to Admission medications   Medication Sig Start Date End Date Taking? Authorizing Provider  ALPRAZolam (XANAX) 0.25 MG tablet Take 1 tablet (0.25 mg total) by mouth daily as needed (tremors). 08/23/19  Yes Nettey, Ralph A, MD  Apoaequorin (PREVAGEN PO) Take 1  capsule by mouth daily.   Yes [provider]  atorvastatin (LIPITOR) 20 MG tablet Take 20 mg by mouth daily.   Yes [provider]  budesonide (ENTOCORT EC) 3 MG 24 hr capsule Take 9 mg by mouth in the morning.   Yes [provider]  DULoxetine (CYMBALTA) 20 MG capsule Take 20 mg by mouth daily.   Yes [provider]  furosemide (LASIX) 20 MG tablet Take 20 mg by mouth in the morning.   Yes [provider]  lisinopril (ZESTRIL) 10 MG tablet Take 10 mg by mouth daily.   Yes [provider]  loperamide (IMODIUM A-D) 2 MG tablet Take 4-6 mg by mouth See admin instructions. Take 6 mg by mouth in the morning and an additional 4 mg with each subsequent loose stool   Yes [provider]  Multiple Vitamins-Minerals (PRESERVISION AREDS 2) CAPS Take 1 capsule by mouth daily.   Yes [provider]  primidone (MYSOLINE) 50 MG tablet Take 2 tablets (100 mg total) by mouth 3 (three) times daily. Must keep follow up 07/07/2020 for ongoing refills Patient taking differently: Take 100 mg by mouth in the morning, at noon, and at bedtime. 06/04/20  Yes Millikan, Megan, NP  Vitamin D, Ergocalciferol, (DRISDOL) 1.25 MG (50000 UNIT) CAPS capsule Take 50,000 Units by mouth every Monday.   Yes [provider]  Nutritional Supplements (,FEEDING SUPPLEMENT, PROSOURCE PLUS) liquid Take 30 mLs by mouth 3 (three) times daily   between meals. Patient not taking: Reported on 07/08/2022 08/23/19   Nettey, Ralph A, MD  promethazine (PHENERGAN) 25 MG tablet Take 25 mg by mouth 5 (five) times daily as needed for nausea or vomiting.  08/10/19   [provider]    Current Facility-Administered Medications  Medication Dose Route Frequency Provider Last Rate Last Admin   0.9 %  sodium chloride infusion   Intravenous Continuous Ikramullah, Mir M, MD 100 mL/hr at 07/09/22 0622 New Bag at 07/09/22 0622   acetaminophen (TYLENOL) tablet 650 mg  650 mg Oral  Q6H PRN Ikramullah, Mir M, MD       Or   acetaminophen (TYLENOL) suppository 650 mg  650 mg Rectal Q6H PRN Ikramullah, Mir M, MD       albuterol (PROVENTIL) (2.5 MG/3ML) 0.083% nebulizer solution 2.5 mg  2.5 mg Nebulization Q2H PRN Ikramullah, Mir M, MD       atorvastatin (LIPITOR) tablet 20 mg  20 mg Oral Daily Ikramullah, Mir M, MD   20 mg at 07/09/22 0921   enoxaparin (LOVENOX) injection 30 mg  30 mg Subcutaneous Q24H Ikramullah, Mir M, MD   30 mg at 07/08/22 2106   ondansetron (ZOFRAN) tablet 4 mg  4 mg Oral Q6H PRN Ikramullah, Mir M, MD       Or   ondansetron (ZOFRAN) injection 4 mg  4 mg Intravenous Q6H PRN Ikramullah, Mir M, MD       primidone (MYSOLINE) tablet 100 mg  100 mg Oral TID Ikramullah, Mir M, MD   100 mg at 07/09/22 0921   traZODone (DESYREL) tablet 25 mg  25 mg Oral QHS PRN Ikramullah, Mir M, MD        Allergies as of 07/08/2022 - Review Complete 07/08/2022  Allergen Reaction Noted   Macrodantin [nitrofurantoin macrocrystal] Shortness Of Breath, Itching, Nausea And Vomiting, Swelling, and Rash 09/09/2012   Topamax [topiramate] Other (See Comments) 06/03/2015   Diclofenac Other (See Comments) 09/08/2015   Tramadol Other (See Comments) 09/08/2015   Rice Nausea And Vomiting 07/08/2022   Rosemary oil Nausea And Vomiting 07/08/2022   Codeine Nausea And Vomiting 09/09/2012    Family History  Problem Relation Age of Onset   Lung cancer Mother    COPD Father    Tremor Father    Hypertension Brother    Tremor Brother     Social History   Socioeconomic History   Marital status: Married    Spouse name: Not on file   Number of children: 0   Years of education: MA   Highest education level: Not on file  Occupational History   Occupation: Retired  Tobacco Use   Smoking status: Former    Types: Cigarettes    Quit date: 09/11/1988    Years since quitting: 33.8   Smokeless tobacco: Never  Vaping Use   Vaping Use: Never used  Substance and Sexual Activity   Alcohol  use: Yes    Comment: 2 drinks at night   Drug use: No   Sexual activity: Not on file  Other Topics Concern   Not on file  Social History Narrative   Denies any caffeine use    Retired    Left hnaded   Social Determinants of Health   Financial Resource Strain: Not on file  Food Insecurity: No Food Insecurity (07/08/2022)   Hunger Vital Sign    Worried About Running Out of Food in the Last Year: Never true    Ran Out of Food in   the Last Year: Never true  Transportation Needs: No Transportation Needs (07/08/2022)   PRAPARE - Transportation    Lack of Transportation (Medical): No    Lack of Transportation (Non-Medical): No  Physical Activity: Not on file  Stress: Not on file  Social Connections: Not on file  Intimate Partner Violence: Not At Risk (07/08/2022)   Humiliation, Afraid, Rape, and Kick questionnaire    Fear of Current or Ex-Partner: No    Emotionally Abused: No    Physically Abused: No    Sexually Abused: No    Review of Systems: As per HPI, all others negative  Physical Exam: Vital signs in last 24 hours: Temp:  [97.5 F (36.4 C)-98.2 F (36.8 C)] 97.8 F (36.6 C) (06/21 0531) Pulse Rate:  [55-172] 80 (06/21 0531) Resp:  [10-20] 16 (06/21 0531) BP: (87-126)/(40-99) 108/53 (06/21 0531) SpO2:  [55 %-100 %] 99 % (06/21 0531) Last BM Date : 07/08/22 General:   Alert,  thin and somewhat frail-appearing, but non-toxic, cooperative in NAD Head:  Normocephalic and atraumatic. Eyes:  Sclera clear, no icterus.   Conjunctiva pink. Ears:  Normal auditory acuity. Nose:  No deformity, discharge,  or lesions. Mouth:  No deformity or lesions.  Oropharynx pink & moist. Neck:  Supple; no masses or thyromegaly. Lungs:  No respiratory distress Abdomen:  Soft, nontender and nondistended. No masses, hepatosplenomegaly or hernias noted. Normal bowel sounds, without guarding, and without rebound.     Msk:  Symmetrical without gross deformities. Normal posture. Pulses:  Normal  pulses noted. Extremities:  Without clubbing or edema. Neurologic:  Alert and  oriented x4;  grossly normal neurologically. Skin:  Intact without significant lesions or rashes. Psych:  Alert and cooperative. Normal mood and affect.   Lab Results: Recent Labs    07/08/22 1204 07/08/22 2059 07/09/22 0434  WBC 5.3 3.3* 3.1*  HGB 12.8 10.4* 9.6*  HCT 38.1 30.6* 28.4*  PLT 156 118* 108*   BMET Recent Labs    07/08/22 1204 07/08/22 2059 07/09/22 0434  NA 135  --  138  K 2.8*  --  3.6  CL 110  --  118*  CO2 16*  --  14*  GLUCOSE 112*  --  89  BUN 60*  --  32*  CREATININE 1.76* 1.23* 1.07*  CALCIUM 7.3*  --  6.4*   LFT Recent Labs    07/09/22 0434  PROT 4.6*  ALBUMIN 2.3*  AST 35  ALT 18  ALKPHOS 62  BILITOT 0.7  BILIDIR 0.1  IBILI 0.6   PT/INR Recent Labs    07/08/22 1204  LABPROT 14.7  INR 1.1    Studies/Results: CT ABDOMEN PELVIS WO CONTRAST  Result Date: 07/08/2022 CLINICAL DATA:  Nonlocalized abdominal pain EXAM: CT ABDOMEN AND PELVIS WITHOUT CONTRAST TECHNIQUE: Multidetector CT imaging of the abdomen and pelvis was performed following the standard protocol without IV contrast. RADIATION DOSE REDUCTION: This exam was performed according to the departmental dose-optimization program which includes automated exposure control, adjustment of the mA and/or kV according to patient size and/or use of iterative reconstruction technique. COMPARISON:  Noncontrast CT 08/13/2019 and older. FINDINGS: Lower chest: There is some linear opacity lung bases likely scar or atelectasis. No pleural effusion. Some breathing motion. Moderate hiatal hernia. Coronary artery calcifications are seen. Hepatobiliary: Stones in the nondilated gallbladder. There are some tiny low-attenuation lesion seen in the liver which are too small to completely characterize although possibly benign cysts. No specific imaging follow-up. These were seen on the   prior in retrospect. Pancreas: Moderate global  atrophy of the pancreas. No pancreatic mass. Spleen: Normal in size without focal abnormality. Adrenals/Urinary Tract: The adrenal glands are preserved. No obstructing renal stone or collecting system dilatation. The ureters have normal course and caliber extending down to the bladder. Preserved contour to the urinary bladder. Prominent bilateral renal sinus fat. Preserved adrenal glands. Stomach/Bowel: Stomach is within normal limits. Appendix appears normal. No evidence of bowel wall thickening, distention, or inflammatory changes. Vascular/Lymphatic: Aortic atherosclerosis. No enlarged abdominal or pelvic lymph nodes. Reproductive: Uterus and bilateral adnexa are unremarkable. Other: No free air or free fluid. Musculoskeletal: Curvature of the spine with degenerative changes. Augmentation cement once again seen at L2. Degenerative changes of the pelvis. IMPRESSION: No bowel obstruction, free air or free fluid. Gallstones. No obstructing renal stone Moderate hiatal hernia. Electronically Signed   By: Ashok  Gupta M.D.   On: 07/08/2022 15:21   DG Chest 2 View  Result Date: 07/08/2022 CLINICAL DATA:  Sepsis. EXAM: CHEST - 2 VIEW COMPARISON:  Chest x-ray dated August 15, 2019. FINDINGS: The heart size and mediastinal contours are within normal limits. Normal pulmonary vascularity. Chronic hyperinflation. No focal consolidation, pleural effusion, or pneumothorax. No acute osseous abnormality. IMPRESSION: 1. No acute cardiopulmonary disease. COPD. Electronically Signed   By: Parthena Fergeson T Derry M.D.   On: 07/08/2022 13:45    Impression:   Abdominal pain. Nausea/vomiting. Gallstones on imaging studies. Overall constellation of symptoms most consistent with biliary colic. Pancytopenia, mild, worsening in hospital, (at least in part) medication related?  Plan:   IVF, judicious analgesics, antiemetics. Clear liquid diet, NPO after midnight. PPI. Endoscopy tomorrow. Risks (bleeding, infection, bowel  perforation that could require surgery, sedation-related changes in cardiopulmonary systems), benefits (identification and possible treatment of source of symptoms, exclusion of certain causes of symptoms), and alternatives (watchful waiting, radiographic imaging studies, empiric medical treatment) of upper endoscopy (EGD) were explained to patient/family in detail and patient wishes to proceed.  If endoscopy unrevealing for compelling cause of symptoms, then would consult surgery for consideration of cholecystectomy.   LOS: 0 days   Hershal Eriksson M  07/09/2022, 11:40 AM  Cell [phone] If no answer or after 5 PM call [phone]  

## 2022-07-09 NOTE — Progress Notes (Signed)
Mobility Specialist - Progress Note   07/09/22 1431  Mobility  Activity Dangled on edge of bed;Stood at bedside  Level of Assistance Minimal assist, patient does 75% or more  Range of Motion/Exercises Active Assistive  Activity Response Tolerated fair  Mobility Referral Yes  $Mobility charge 1 Mobility  Mobility Specialist Start Time (ACUTE ONLY) 1420  Mobility Specialist Stop Time (ACUTE ONLY) 1430  Mobility Specialist Time Calculation (min) (ACUTE ONLY) 10 min   Pt was found in bed with husband in room. RN to room. Assisted pt to sit EOB for BP reading 104/61. Pt feels weak and was min-A to stand. Able to side step 53ft. At EOS was left in bed with RN in room.  Billey Chang Mobility Specialist

## 2022-07-09 NOTE — Anesthesia Preprocedure Evaluation (Signed)
Anesthesia Evaluation  Patient identified by MRN, date of birth, ID band Patient awake    Reviewed: Allergy & Precautions, NPO status , Patient's Chart, lab work & pertinent test results  Airway Mallampati: II  TM Distance: >3 FB Neck ROM: Full    Dental  (+) Dental Advisory Given, Missing   Pulmonary shortness of breath, former smoker   Pulmonary exam normal breath sounds clear to auscultation       Cardiovascular hypertension, Normal cardiovascular exam Rhythm:Regular Rate:Normal  Echo 2021 1. Left ventricular ejection fraction, by estimation, is 65 to 70%. The  left ventricle has normal function. The left ventricle has no regional  wall motion abnormalities. Left ventricular diastolic parameters are  consistent with Grade I diastolic  dysfunction (impaired relaxation).   2. Right ventricular systolic function is normal. The right ventricular  size is normal. There is normal pulmonary artery systolic pressure. The  estimated right ventricular systolic pressure is 31.7 mmHg.   3. The mitral valve is degenerative. Mild to moderate mitral valve  regurgitation. No evidence of mitral stenosis.   4. Tricuspid valve regurgitation is mild to moderate.   5. The aortic valve is abnormal. Aortic valve regurgitation is trivial.  No aortic stenosis is present.   6. The inferior vena cava is normal in size with greater than 50%  respiratory variability, suggesting right atrial pressure of 3 mmHg.      Neuro/Psych  Headaches PSYCHIATRIC DISORDERS Anxiety        GI/Hepatic Neg liver ROS, hiatal hernia,GERD  ,,  Endo/Other  negative endocrine ROS    Renal/GU Renal disease     Musculoskeletal  (+) Arthritis ,    Abdominal   Peds  Hematology negative hematology ROS (+)   Anesthesia Other Findings   Reproductive/Obstetrics                             Anesthesia Physical Anesthesia Plan  ASA:  3  Anesthesia Plan: MAC   Post-op Pain Management: Minimal or no pain anticipated   Induction: Intravenous  PONV Risk Score and Plan: 2 and Treatment may vary due to age or medical condition, Propofol infusion and TIVA  Airway Management Planned: Natural Airway  Additional Equipment:   Intra-op Plan:   Post-operative Plan:   Informed Consent: I have reviewed the patients History and Physical, chart, labs and discussed the procedure including the risks, benefits and alternatives for the proposed anesthesia with the patient or authorized representative who has indicated his/her understanding and acceptance.     Dental advisory given  Plan Discussed with: CRNA  Anesthesia Plan Comments:        Anesthesia Quick Evaluation

## 2022-07-10 ENCOUNTER — Encounter (HOSPITAL_COMMUNITY)
Admission: EM | Disposition: A | Discharge status: Home / Self Care | Payer: Self-pay | Source: Home / Self Care | Attending: Internal Medicine

## 2022-07-10 ENCOUNTER — Inpatient Hospital Stay (HOSPITAL_COMMUNITY): Payer: Medicare PPO | Admitting: Anesthesiology

## 2022-07-10 DIAGNOSIS — Z87891 Personal history of nicotine dependence: Secondary | ICD-10-CM

## 2022-07-10 DIAGNOSIS — K3189 Other diseases of stomach and duodenum: Secondary | ICD-10-CM | POA: Diagnosis not present

## 2022-07-10 DIAGNOSIS — K8050 Calculus of bile duct without cholangitis or cholecystitis without obstruction: Secondary | ICD-10-CM | POA: Diagnosis present

## 2022-07-10 DIAGNOSIS — K449 Diaphragmatic hernia without obstruction or gangrene: Secondary | ICD-10-CM

## 2022-07-10 DIAGNOSIS — I10 Essential (primary) hypertension: Secondary | ICD-10-CM | POA: Diagnosis not present

## 2022-07-10 DIAGNOSIS — F419 Anxiety disorder, unspecified: Secondary | ICD-10-CM

## 2022-07-10 DIAGNOSIS — K2900 Acute gastritis without bleeding: Secondary | ICD-10-CM | POA: Diagnosis not present

## 2022-07-10 HISTORY — PX: BIOPSY: SHX5522

## 2022-07-10 HISTORY — PX: ESOPHAGOGASTRODUODENOSCOPY (EGD) WITH PROPOFOL: SHX5813

## 2022-07-10 LAB — BASIC METABOLIC PANEL
Anion gap: 5 (ref 5–15)
BUN: 13 mg/dL (ref 8–23)
CO2: 15 mmol/L — ABNORMAL LOW (ref 22–32)
Calcium: 6.1 mg/dL — CL (ref 8.9–10.3)
Chloride: 115 mmol/L — ABNORMAL HIGH (ref 98–111)
Creatinine, Ser: 0.91 mg/dL (ref 0.44–1.00)
GFR, Estimated: >60 mL/min (ref >60)
Glucose, Bld: 92 mg/dL (ref 70–99)
Potassium: 3.7 mmol/L (ref 3.5–5.1)
Sodium: 135 mmol/L (ref 135–145)

## 2022-07-10 LAB — MAGNESIUM: Magnesium: 1.9 mg/dL (ref 1.7–2.4)

## 2022-07-10 LAB — PHOSPHORUS: Phosphorus: 2.1 mg/dL — ABNORMAL LOW (ref 2.5–4.6)

## 2022-07-10 LAB — CULTURE, BLOOD (ROUTINE X 2): Special Requests: ADEQUATE

## 2022-07-10 SURGERY — ESOPHAGOGASTRODUODENOSCOPY (EGD) WITH PROPOFOL
Anesthesia: Monitor Anesthesia Care | Laterality: Left

## 2022-07-10 MED ORDER — DULOXETINE HCL 20 MG PO CPEP
20.0000 mg | ORAL_CAPSULE | Freq: Every day | ORAL | Status: DC
  Administered 2022-07-11 – 2022-07-14 (×3): 20 mg via ORAL
  Filled 2022-07-10 (×4): qty 1

## 2022-07-10 MED ORDER — CALCIUM CHLORIDE 10 % IV SOLN
INTRAVENOUS | Status: DC | PRN
  Administered 2022-07-10: 1 g via INTRAVENOUS

## 2022-07-10 MED ORDER — LIDOCAINE HCL (CARDIAC) PF 100 MG/5ML IV SOSY
PREFILLED_SYRINGE | INTRAVENOUS | Status: DC | PRN
  Administered 2022-07-10: 50 mg via INTRAVENOUS

## 2022-07-10 MED ORDER — PROPOFOL 10 MG/ML IV BOLUS
INTRAVENOUS | Status: AC
  Filled 2022-07-10: qty 20

## 2022-07-10 MED ORDER — AMISULPRIDE (ANTIEMETIC) 5 MG/2ML IV SOLN
5.0000 mg | Freq: Once | INTRAVENOUS | Status: AC | PRN
  Administered 2022-07-10: 5 mg via INTRAVENOUS

## 2022-07-10 MED ORDER — PHENYLEPHRINE HCL (PRESSORS) 10 MG/ML IV SOLN
INTRAVENOUS | Status: DC | PRN
  Administered 2022-07-10: 160 ug via INTRAVENOUS

## 2022-07-10 MED ORDER — CALCIUM CHLORIDE 10 % IV SOLN
INTRAVENOUS | Status: AC
  Filled 2022-07-10: qty 10

## 2022-07-10 MED ORDER — LACTATED RINGERS IV SOLN
INTRAVENOUS | Status: AC | PRN
  Administered 2022-07-10: 1000 mL via INTRAVENOUS

## 2022-07-10 MED ORDER — PROPOFOL 500 MG/50ML IV EMUL
INTRAVENOUS | Status: DC | PRN
  Administered 2022-07-10: 100 ug/kg/min via INTRAVENOUS

## 2022-07-10 MED ORDER — ALPRAZOLAM 0.25 MG PO TABS
0.2500 mg | ORAL_TABLET | Freq: Three times a day (TID) | ORAL | Status: DC | PRN
  Administered 2022-07-11 – 2022-07-13 (×6): 0.25 mg via ORAL
  Filled 2022-07-10 (×6): qty 1

## 2022-07-10 MED ORDER — SODIUM PHOSPHATES 45 MMOLE/15ML IV SOLN
30.0000 mmol | Freq: Once | INTRAVENOUS | Status: AC
  Administered 2022-07-10: 30 mmol via INTRAVENOUS
  Filled 2022-07-10: qty 10

## 2022-07-10 MED ORDER — AMISULPRIDE (ANTIEMETIC) 5 MG/2ML IV SOLN
INTRAVENOUS | Status: AC
  Filled 2022-07-10: qty 2

## 2022-07-10 MED ORDER — PROPOFOL 10 MG/ML IV BOLUS
INTRAVENOUS | Status: DC | PRN
  Administered 2022-07-10: 20 mg via INTRAVENOUS

## 2022-07-10 SURGICAL SUPPLY — 15 items

## 2022-07-10 NOTE — Consult Note (Signed)
Reason for Consult:gallstones Referring Physician: Dr. Eric [place]  Brenda [name] is an 82 y.o. female.  HPI: Over 82 year old female with prior history of irritable bowel syndrome and chronic diarrhea as well as essential tremors who has a 1 month history of intermittent abdominal pain with emesis.  She reports several hours after eating she will suddenly have nausea and vomiting.  It is only with solid foods.  She has had no difficulty swallowing.  She is able to tolerate liquids.  She had a previous robotic hiatal hernia repair in 2017 by Dr. Derrell Lolling.  She had an upper endoscopy in 2019 showing a mild hiatal hernia and esophageal dysmotility.  She had an upper endoscopy today which showed some mild duodenitis and gastritis.  Biopsies were taken of this area. She had a CT scan which did show gallstones.  She also has a moderate hiatal hernia.  She has no cardiopulmonary issues and no previous problems with general anesthesia  Past Medical History:  Diagnosis Date   Anxiety    Chronic diarrhea    Coarse tremors    Colon polyp    DDD (degenerative disc disease), cervical    Hiatal hernia    History of skin cancer    Hypercholesteremia    Hypertension    Migraines    Osteoporosis    Shortness of breath dyspnea    with exertion   Tremor    Weight loss     Past Surgical History:  Procedure Laterality Date   ESOPHAGEAL MANOMETRY N/A 01/15/2015   Procedure: ESOPHAGEAL MANOMETRY (EM);  Surgeon: Graylin Shiver, MD;  Location: [facility];  Service: Endoscopy;  Laterality: N/A;   HIATAL HERNIA REPAIR     KYPHOPLASTY N/A 12/22/2017   Procedure: LUMBAR TWO KYPHOPLASTY;  Surgeon: Estill Bamberg, MD;  Location: [facility] OR;  Service: Orthopedics;  Laterality: N/A;  LUMBAR TWO KYPHOPLASTY   RHINOPLASTY     SINUS SURGERY WITH INSTATRAK      Family History  Problem Relation Age of Onset   Lung cancer Mother    COPD Father    Tremor Father    Hypertension Brother    Tremor Brother     Social  History:  reports that she quit smoking about 33 years ago. Her smoking use included cigarettes. She has never used smokeless tobacco. She reports current alcohol use. She reports that she does not use drugs.  Allergies:  Allergies  Allergen Reactions   Macrodantin [Nitrofurantoin Macrocrystal] Shortness Of Breath, Itching, Nausea And Vomiting, Swelling and Rash    Tongue swelling   Topamax [Topiramate] Other (See Comments)    Loss of taste, fatigue!!!!!!   Diclofenac Other (See Comments)    Stomach burning   Tramadol Other (See Comments)    Stomach burning   Rice Nausea And Vomiting   Rosemary Oil Nausea And Vomiting   Codeine Nausea And Vomiting    Medications: I have reviewed the patient's current medications.  Results for orders placed or performed during the hospital encounter of 07/08/22 (from the past 48 hour(s))  Lactic acid, plasma     Status: None   Collection Time: 07/08/22 12:04 PM  Result Value Ref Range   Lactic Acid, Venous 1.0 0.5 - 1.9 mmol/L    Comment: Performed at [facility], [address] [place], [place] [address]  Comprehensive metabolic panel     Status: Abnormal   Collection Time: 07/08/22 12:04 PM  Result Value Ref Range   Sodium 135 135 -  145 mmol/L   Potassium 2.8 (L) 3.5 - 5.1 mmol/L   Chloride 110 98 - 111 mmol/L   CO2 16 (L) 22 - 32 mmol/L   Glucose, Bld 112 (H) 70 - 99 mg/dL    Comment: Glucose reference range applies only to samples taken after fasting for at least 8 hours.   BUN 60 (H) 8 - 23 mg/dL   Creatinine, Ser 1.61 (H) 0.44 - 1.00 mg/dL   Calcium 7.3 (L) 8.9 - 10.3 mg/dL   Total Protein 6.2 (L) 6.5 - 8.1 g/dL   Albumin 3.1 (L) 3.5 - 5.0 g/dL   AST 43 (H) 15 - 41 U/L   ALT 23 0 - 44 U/L   Alkaline Phosphatase 85 38 - 126 U/L   Total Bilirubin 0.7 0.3 - 1.2 mg/dL   GFR, Estimated 29 (L) >60 mL/min    Comment: (NOTE) Calculated using the CKD-EPI Creatinine Equation (2021)    Anion gap 9 5 - 15    Comment:  Performed at [facility], [address], [place], [place] [address]  CBC with Differential     Status: None   Collection Time: 07/08/22 12:04 PM  Result Value Ref Range   WBC 5.3 4.0 - 10.5 K/uL   RBC 3.87 3.87 - 5.11 MIL/uL   Hemoglobin 12.8 12.0 - 15.0 g/dL   HCT 54.0 98.1 - 19.1 %   MCV 98.4 80.0 - 100.0 fL   MCH 33.1 26.0 - 34.0 pg   MCHC 33.6 30.0 - 36.0 g/dL   RDW 47.8 29.5 - 62.1 %   Platelets 156 150 - 400 K/uL   nRBC 0.0 0.0 - 0.2 %   Neutrophils Relative % 51 %   Neutro Abs 2.7 1.7 - 7.7 K/uL   Lymphocytes Relative 30 %   Lymphs Abs 1.6 0.7 - 4.0 K/uL   Monocytes Relative 16 %   Monocytes Absolute 0.9 0.1 - 1.0 K/uL   Eosinophils Relative 2 %   Eosinophils Absolute 0.1 0.0 - 0.5 K/uL   Basophils Relative 1 %   Basophils Absolute 0.0 0.0 - 0.1 K/uL   Immature Granulocytes 0 %   Abs Immature Granulocytes 0.01 0.00 - 0.07 K/uL    Comment: Performed at [facility], [address], [place], [place] [address]  Protime-INR     Status: None   Collection Time: 07/08/22 12:04 PM  Result Value Ref Range   Prothrombin Time 14.7 11.4 - 15.2 seconds   INR 1.1 0.8 - 1.2    Comment: (NOTE) INR goal varies based on device and disease states. Performed at [facility], [address], [place], [place] [address]   APTT     Status: None   Collection Time: 07/08/22 12:04 PM  Result Value Ref Range   aPTT 30 24 - 36 seconds    Comment: Performed at [facility], [address], [place], [place] [address]  Blood Culture (routine x 2)     Status: None (Preliminary result)   Collection Time: 07/08/22 12:06 PM   Specimen: BLOOD LEFT ARM  Result Value Ref Range   Specimen Description      BLOOD LEFT ARM Performed at [facility], [address], [place], [place] [address]    Special Requests      BOTTLES DRAWN AEROBIC AND ANAEROBIC Blood Culture adequate volume Performed at [facility], [address] [place], [place] [address]    Culture  NO GROWTH 2 DAYS Performed at [facility], [address] [place], [place] [address]    Report Status PENDING   Blood Culture (routine x 2)     Status: None (Preliminary result)   Collection Time: 07/08/22 12:13 PM   Specimen: BLOOD LEFT FOREARM  Result Value Ref Range   Specimen Description      BLOOD LEFT FOREARM Performed at [facility], [address], [place], [place] [address]    Special Requests      BOTTLES DRAWN AEROBIC AND ANAEROBIC Blood Culture adequate volume Performed at [facility], [address] [place], [place] [address]    Culture      NO GROWTH 2 DAYS Performed at [facility], [address] [place], [place] [address]    Report Status PENDING   Urinalysis, Routine w reflex microscopic -Urine, Clean Catch     Status: Abnormal   Collection Time: 07/08/22 12:52 PM  Result Value Ref Range   Color, Urine YELLOW YELLOW   APPearance CLEAR CLEAR   Specific Gravity, Urine 1.013 1.005 - 1.030   pH 6.0 5.0 - 8.0   Glucose, UA NEGATIVE NEGATIVE mg/dL   Hgb urine dipstick NEGATIVE NEGATIVE   Bilirubin Urine NEGATIVE NEGATIVE   Ketones, ur NEGATIVE NEGATIVE mg/dL   Protein, ur 30 (A) NEGATIVE mg/dL   Nitrite NEGATIVE NEGATIVE   Leukocytes,Ua NEGATIVE NEGATIVE   RBC / HPF 0-5 0 - 5 RBC/hpf   WBC, UA 0-5 0 - 5 WBC/hpf   Bacteria, UA RARE (A) NONE SEEN   Squamous Epithelial / HPF 0-5 0 - 5 /HPF   Hyaline Casts, UA PRESENT     Comment: Performed at [facility], [address] [place], [place] [address]  Lipase, blood     Status: Abnormal   Collection Time: 07/08/22  1:12 PM  Result Value Ref Range   Lipase 54 (H) 11 - 51 U/L    Comment: Performed at [facility], [address], [place], [place] [address]  CBC     Status: Abnormal   Collection Time: 07/08/22  8:59 PM  Result Value Ref Range    WBC 3.3 (L) 4.0 - 10.5 K/uL   RBC 3.08 (L) 3.87 - 5.11 MIL/uL   Hemoglobin 10.4 (L) 12.0 - 15.0 g/dL   HCT 36.6 (L) 44.0 - 34.7 %   MCV 99.4 80.0 - 100.0 fL   MCH 33.8 26.0 - 34.0 pg   MCHC 34.0 30.0 - 36.0 g/dL   RDW 42.5 95.6 - 38.7 %   Platelets 118 (L) 150 - 400 K/uL   nRBC 0.0 0.0 - 0.2 %    Comment: Performed at [facility], [address], [place], [place] [address]  Creatinine, serum     Status: Abnormal   Collection Time: 07/08/22  8:59 PM  Result Value Ref Range   Creatinine, Ser 1.23 (H) 0.44 - 1.00 mg/dL   GFR, Estimated 44 (L) >60 mL/min    Comment: (NOTE) Calculated using the CKD-EPI Creatinine Equation (2021) Performed at [facility], [address], [place], [place] [address]   Magnesium     Status: None   Collection Time: 07/08/22  8:59 PM  Result Value Ref Range   Magnesium 1.8 1.7 - 2.4 mg/dL    Comment: Performed at [facility], [address] [place], [place] [address]  Basic metabolic panel     Status:  Abnormal   Collection Time: 07/09/22  4:34 AM  Result Value Ref Range   Sodium 138 135 - 145 mmol/L   Potassium 3.6 3.5 - 5.1 mmol/L   Chloride 118 (H) 98 - 111 mmol/L   CO2 14 (L) 22 - 32 mmol/L   Glucose, Bld 89 70 - 99 mg/dL    Comment: Glucose reference range applies only to samples taken after fasting for at least 8 hours.   BUN 32 (H) 8 - 23 mg/dL   Creatinine, Ser 1.61 (H) 0.44 - 1.00 mg/dL   Calcium 6.4 (LL) 8.9 - 10.3 mg/dL    Comment: CRITICAL RESULT CALLED TO, READ BACK BY AND VERIFIED WITH A. WATTS, RN 07/09/22 0522 BY K. DAVIS   GFR, Estimated 53 (L) >60 mL/min    Comment: (NOTE) Calculated using the CKD-EPI Creatinine Equation (2021)    Anion gap 6 5 - 15    Comment: Performed at [facility], [address] [place], [place] [address]  CBC     Status: Abnormal   Collection Time: 07/09/22  4:34 AM  Result Value Ref Range   WBC 3.1 (L) 4.0 - 10.5 K/uL    RBC 2.84 (L) 3.87 - 5.11 MIL/uL   Hemoglobin 9.6 (L) 12.0 - 15.0 g/dL   HCT 54.0 (L) 98.1 - 19.1 %   MCV 100.0 80.0 - 100.0 fL   MCH 33.8 26.0 - 34.0 pg   MCHC 33.8 30.0 - 36.0 g/dL   RDW 47.8 29.5 - 62.1 %   Platelets 108 (L) 150 - 400 K/uL   nRBC 0.0 0.0 - 0.2 %    Comment: Performed at [facility], [address], [place], [place] [address]  Hepatic function panel     Status: Abnormal   Collection Time: 07/09/22  4:34 AM  Result Value Ref Range   Total Protein 4.6 (L) 6.5 - 8.1 g/dL   Albumin 2.3 (L) 3.5 - 5.0 g/dL   AST 35 15 - 41 U/L   ALT 18 0 - 44 U/L   Alkaline Phosphatase 62 38 - 126 U/L   Total Bilirubin 0.7 0.3 - 1.2 mg/dL   Bilirubin, Direct 0.1 0.0 - 0.2 mg/dL   Indirect Bilirubin 0.6 0.3 - 0.9 mg/dL    Comment: Performed at [facility] [address] [place], [place] [address]  Blood gas, venous     Status: Abnormal   Collection Time: 07/09/22  7:12 AM  Result Value Ref Range   pH, Ven 7.3 7.25 - 7.43   pCO2, Ven 29 (L) 44 - 60 mmHg   pO2, Ven 50 (H) 32 - 45 mmHg   Bicarbonate 14.3 (L) 20.0 - 28.0 mmol/L   Acid-base deficit 10.7 (H) 0.0 - 2.0 mmol/L   O2 Saturation 83.3 %   Patient temperature 36.8    Collection site RIGHT ANTECUBITAL    Drawn by [id number]     Comment: Performed at [facility], [address] [place], [place] [address]  VITAMIN D 25 Hydroxy (Vit-D Deficiency, Fractures)     Status: None   Collection Time: 07/09/22  9:48 AM  Result Value Ref Range   Vit D, 25-Hydroxy 46.01 30 - 100 ng/mL    Comment: (NOTE) Vitamin D deficiency has been defined by the Institute of Medicine  and an Endocrine Society practice guideline as a level of serum 25-OH  vitamin D less than 20 ng/mL (1,2). The Endocrine Society went on to  further define vitamin D insufficiency as a level between 21 and 29  ng/mL (2).  1. IOM (Institute of Medicine). 2010. Dietary reference intakes for  calcium and D. [place]: [employer]. 2. Holick MF, Binkley Linden, Bischoff-Ferrari HA, et al. Evaluation,  treatment, and prevention of vitamin D deficiency: an Endocrine  Society clinical practice guideline, JCEM. 2011 Jul; 96(7): 1911-30.  Performed at [facility], [address] [place], [place] [address]   Magnesium     Status: None   Collection Time: 07/09/22  9:48 AM  Result Value Ref Range   Magnesium 1.7 1.7 - 2.4 mg/dL    Comment: Performed at [facility], [address] [place], [place] [address]  Phosphorus     Status: Abnormal   Collection Time: 07/09/22  9:48 AM  Result Value Ref Range   Phosphorus 1.4 (L) 2.5 - 4.6 mg/dL    Comment: Performed at [facility], [address] [place], [place] [address]  Basic metabolic panel     Status: Abnormal   Collection Time: 07/10/22  4:59 AM  Result Value Ref Range   Sodium 135 135 - 145 mmol/L   Potassium 3.7 3.5 - 5.1 mmol/L   Chloride 115 (H) 98 - 111 mmol/L   CO2 15 (L) 22 - 32 mmol/L   Glucose, Bld 92 70 - 99 mg/dL    Comment: Glucose reference range applies only to samples taken after fasting for at least 8 hours.   BUN 13 8 - 23 mg/dL   Creatinine, Ser 2.84 0.44 - 1.00 mg/dL   Calcium 6.1 (LL) 8.9 - 10.3 mg/dL    Comment: CRITICAL RESULT CALLED TO, READ BACK BY AND VERIFIED WITH RAVO, V. RN AT [phone] ON 07/10/2022 BY MECIAL J.    GFR, Estimated >60 >60 mL/min    Comment: (NOTE) Calculated using the CKD-EPI Creatinine Equation (2021)    Anion gap 5 5 - 15    Comment: Performed at [facility], [address] [place], [place] [address]  Magnesium     Status: None   Collection Time: 07/10/22  4:59 AM  Result Value Ref Range   Magnesium 1.9 1.7 - 2.4 mg/dL    Comment: Performed at [facility], [address] [place], [place] [address]  Phosphorus     Status: Abnormal   Collection Time: 07/10/22  4:59 AM  Result Value Ref Range    Phosphorus 2.1 (L) 2.5 - 4.6 mg/dL    Comment: Performed at [facility], [address] [place], [place] [address]    CT ABDOMEN PELVIS WO CONTRAST  Result Date: 07/08/2022 CLINICAL DATA:  Nonlocalized abdominal pain EXAM: CT ABDOMEN AND PELVIS WITHOUT CONTRAST TECHNIQUE: Multidetector CT imaging of the abdomen and pelvis was performed following the standard protocol without IV contrast. RADIATION DOSE REDUCTION: This exam was performed according to the departmental dose-optimization program which includes automated exposure control, adjustment of the mA and/or kV according to patient size and/or use of iterative reconstruction technique. COMPARISON:  Noncontrast CT 08/13/2019 and older. FINDINGS: Lower chest: There is some linear opacity lung bases likely scar or atelectasis. No pleural effusion. Some breathing motion. Moderate hiatal hernia. Coronary artery calcifications are seen. Hepatobiliary: Stones in the nondilated gallbladder. There are some tiny low-attenuation lesion seen in the liver which are too small to completely characterize although possibly benign cysts. No specific imaging follow-up. These were seen on the prior in  retrospect. Pancreas: Moderate global atrophy of the pancreas. No pancreatic mass. Spleen: Normal in size without focal abnormality. Adrenals/Urinary Tract: The adrenal glands are preserved. No obstructing renal stone or collecting system dilatation. The ureters have normal course and caliber extending down to the bladder. Preserved contour to the urinary bladder. Prominent bilateral renal sinus fat. Preserved adrenal glands. Stomach/Bowel: Stomach is within normal limits. Appendix appears normal. No evidence of bowel wall thickening, distention, or inflammatory changes. Vascular/Lymphatic: Aortic atherosclerosis. No enlarged abdominal or pelvic lymph nodes. Reproductive: Uterus and bilateral adnexa are unremarkable. Other: No free air or free fluid.  Musculoskeletal: Curvature of the spine with degenerative changes. Augmentation cement once again seen at L2. Degenerative changes of the pelvis. IMPRESSION: No bowel obstruction, free air or free fluid. Gallstones. No obstructing renal stone Moderate hiatal hernia. Electronically Signed   By: Karen Kays M.D.   On: 07/08/2022 15:21   DG Chest 2 View  Result Date: 07/08/2022 CLINICAL DATA:  Sepsis. EXAM: CHEST - 2 VIEW COMPARISON:  Chest x-ray dated August 15, 2019. FINDINGS: The heart size and mediastinal contours are within normal limits. Normal pulmonary vascularity. Chronic hyperinflation. No focal consolidation, pleural effusion, or pneumothorax. No acute osseous abnormality. IMPRESSION: 1. No acute cardiopulmonary disease. COPD. Electronically Signed   By: Obie Dredge M.D.   On: 07/08/2022 13:45    Review of Systems  Constitutional:  Negative for fever.  Respiratory:  Positive for shortness of breath. Negative for choking and chest tightness.   Cardiovascular:  Negative for chest pain.   Blood pressure 126/67, pulse (!) 102, temperature (!) 97.5 F (36.4 C), temperature source Oral, resp. rate 18, height 5\' 4"  (1.626 m), weight 49.9 kg, SpO2 96 %. Physical Exam Constitutional:      Comments: She is still sedated from the endoscopy.  She will answer some questions.  She is thin but not ill-appearing  HENT:     Head: Normocephalic and atraumatic.     Mouth/Throat:     Mouth: Mucous membranes are moist.     Pharynx: Oropharynx is clear.  Cardiovascular:     Rate and Rhythm: Normal rate and regular rhythm.  Pulmonary:     Effort: Pulmonary effort is normal. No respiratory distress.     Breath sounds: Normal breath sounds.  Abdominal:     General: Abdomen is flat.     Comments: Her abdomen is soft and nontender.  She has multiple small well-healed incisions.  There are no hernias or abdominal wall masses  Skin:    General: Skin is warm and dry.  Neurological:     General: No focal  deficit present.  Psychiatric:        Behavior: Behavior normal.     Assessment/Plan: Abdominal pain with nausea and vomiting as well as gallstones  I do suspect her gallstones are the source of this change in her chronic abdominal complaints.  I will get an ultrasound to evaluate the gallbladder and the bile duct.  I we will also discuss her recurrent hiatal hernia with Dr. Derrell Lolling regarding his opinion.  We will likely wait for the biopsy results to come back and get her electrolytes corrected prior to surgery which will likely be Monday.  I discussed this with her and her husband who agree with the plans.  From my standpoint, she may have a diet as tolerated  Moderately complex medical decision making  Abigail Miyamoto 07/10/2022, 11:04 AM

## 2022-07-10 NOTE — Transfer of Care (Signed)
Immediate Anesthesia Transfer of Care Note  Patient: Brenda [name]  Procedure(s) Performed: ESOPHAGOGASTRODUODENOSCOPY (EGD) WITH PROPOFOL (Left) BIOPSY  Patient Location: PACU  Anesthesia Type:MAC  Level of Consciousness: awake, alert , and oriented  Airway & Oxygen Therapy: Patient Spontanous Breathing  Post-op Assessment: Report given to RN and Post -op Vital signs reviewed and stable  Post vital signs: Reviewed and stable  Last Vitals:  Vitals Value Taken Time  BP 107/91 07/10/22 0815  Temp 36.3 C 07/10/22 0814  Pulse 111 07/10/22 0821  Resp 18 07/10/22 0822  SpO2 87 % 07/10/22 0821  Vitals shown include unvalidated device data.  Last Pain:  Vitals:   07/10/22 0814  TempSrc:   PainSc: 0-No pain         Complications: No notable events documented.

## 2022-07-10 NOTE — Plan of Care (Signed)

## 2022-07-10 NOTE — Op Note (Signed)
[facility] Patient Name: Brenda [name] Procedure Date: 07/10/2022 MRN: [id number] Attending MD: Shirley Friar , MD, [id number] Date of Birth: 09-Oct-1940 CSN: [id number] Age: 82 Admit Type: Inpatient Procedure:                Upper GI endoscopy Indications:              Epigastric abdominal pain, Nausea with vomiting Providers:                Shirley Friar, MD, Stephens Shire RN, RN,                            Rozetta Nunnery, Technician Referring MD:             hospital team Medicines:                Propofol per Anesthesia, Monitored Anesthesia Care Complications:            No immediate complications. Estimated Blood Loss:     Estimated blood loss was minimal. Procedure:                Pre-Anesthesia Assessment:                           - Prior to the procedure, a History and Physical                            was performed, and patient medications and                            allergies were reviewed. The patient's tolerance of                            previous anesthesia was also reviewed. The risks                            and benefits of the procedure and the sedation                            options and risks were discussed with the patient.                            All questions were answered, and informed consent                            was obtained. Prior Anticoagulants: The patient has                            taken no anticoagulant or antiplatelet agents. ASA                            Grade Assessment: III - A patient with severe                            systemic disease. After reviewing the risks and  benefits, the patient was deemed in satisfactory                            condition to undergo the procedure.                           After obtaining informed consent, the endoscope was                            passed under direct vision. Throughout the                            procedure, the  patient's blood pressure, pulse, and                            oxygen saturations were monitored continuously. The                            [id number]-H190 ([id number] Olympus endoscope was introduced                            through the mouth, and advanced to the second part                            of duodenum. The upper GI endoscopy was                            accomplished without difficulty. The patient                            tolerated the procedure well. Scope In: Scope Out: Findings:      The examined esophagus was normal.      The Z-line was regular and was found 36 cm from the incisors.      A medium-sized hiatal hernia was present.      Segmental mild inflammation characterized by congestion (edema) and       erythema was found in the gastric antrum. Biopsies were taken with a       cold forceps for histology. Estimated blood loss was minimal.      Segmental moderate mucosal changes characterized by congestion and       erythema were found in the duodenal bulb. Biopsies were taken with a       cold forceps for histology. Estimated blood loss was minimal.      The exam of the duodenum was otherwise normal. Impression:               - Normal esophagus.                           - Z-line regular, 36 cm from the incisors.                           - Medium-sized hiatal hernia.                           - Acute gastritis. Biopsied.                           -  Mucosal changes in the duodenum. Biopsied. Moderate Sedation:      N/A - MAC procedure Recommendation:           - Clear liquid diet.                           - Observe patient's clinical course.                           - Await pathology results. Procedure Code(s):        --- Professional ---                           [phone], Esophagogastroduodenoscopy, flexible,                            transoral; with biopsy, single or multiple Diagnosis Code(s):        --- Professional ---                           R10.13,  Epigastric pain                           K29.00, Acute gastritis without bleeding                           K44.9, Diaphragmatic hernia without obstruction or                            gangrene                           K31.89, Other diseases of stomach and duodenum                           R11.2, Nausea with vomiting, unspecified CPT copyright 2022 American [employer] Association. All rights reserved. The codes documented in this report are preliminary and upon coder review may  be revised to meet current compliance requirements. Shirley Friar, MD 07/10/2022 8:21:10 AM This report has been signed electronically. Number of Addenda: 0

## 2022-07-10 NOTE — Progress Notes (Signed)
PROGRESS NOTE    ROCKY RISHEL  JYN:[id number] DOB: Jul 28, 1940 DOA: 07/08/2022 PCP: Clovis Riley, L.August Saucer, MD    Brief Narrative:   Brenda [name] is a 82 y.o. female with past medical history significant for irritable bowel syndrome, chronic diarrhea, essential tremor, HTN, HLD, GERD, hiatal hernia s/p Nissen fundoplication 2017, anxiety/depression who presented to [facility] ED on 6/20 from [facility] nursing facility complaining of nausea, vomiting, abdominal pain ongoing and progressive over the last month.  Patient denies dysphagia, trouble with solids as she is able to initially tolerate but vomits thereafter.  She also reports subjective weight loss but denies hematemesis, no melena, no hematochezia.  Is followed by gastroenterology outpatient, and has been diagnosed with IBS, chronic diarrhea, and abnormal esophageal motility.  Patient currently uses budesonide, previously used cholestyramine but did tolerate the "powder".  In the ED, temperature 97.5 F, HR 82, RR 18, BP 92/46, SpO2 100% on room air.  WBC 5.3, hemoglobin 12.8, platelets 156.  Sodium 135, potassium 2.8, chloride 110, CO2 16, glucose 112, BUN 60, creatinine 1.76.  AST 43, ALT 23, total bilirubin 0.7.  Calcium 7.3.  Albumin 3.1.  Urinalysis unrevealing.  Chest x-ray with no acute cardiopulmonary disease process, noted chronic hyperinflation related to COPD.  Lactic acid 1.0.  Patient was given potassium replacement and [facility] consulted for admission for further evaluation and management.  Assessment & Plan:   Abdominal pain associate with nausea/vomiting 2/2 biliary colic Hx hiatal hernia s/p Nissen fundoplication Patient presenting with progressive abdominal pain associate with nausea/vomiting, especially after eating meals.  Endorses associated subjective weight loss.  Has longstanding history of IBS, chronic diarrhea, history of hiatal hernia s/p Nissen fundoplication.  Follows with [facility] outpatient.  EGD 2019 notable  for abnormal esophageal motility, moderate hiatal hernia, gastritis.  GI concerned about symptoms consistent with biliary colic.  Underwent EGD on 6/22 with findings of gastritis/duodenitis; [facility] signed off with recommendation of surgical consultation. -- General surgery following, appreciate assistance -- Right upper quadrant ultrasound: Pending -- Protonix 40 mg IV every 24 hours -- Clear liquid diet -- Anticipate need of cholecystectomy, likely plan for Monday per general surgery  Hypokalemia Etiology likely secondary to GI loss from nausea/vomiting, underlying chronic diarrhea.  Repleted. -- K 2.8>3.6>3.7 -- NS w/ 40 mEq Kcl at 75 mL/h -- Repeat electrolytes in a.m.  Hypocalcemia Calcium 6.4, corrected for hypoalbuminemia of 2.3 = 7.8.  Magnesium 1.7, vitamin D 25-hydroxy level within normal limits. -- Ionized calcium: Pending -- Intact PTH: Pending -- Repeat CMP in a.m.  Hypophosphatemia Phosphorus 2.1, will replete with 30 mmol sodium phosphate IV -- Repeat electrolytes in a.m.  Essential hypertension Patient with slight hypotension on admission, likely secondary to volume depletion. -- Hold home  antihypertensives -- IV fluid hydration  Hyperlipidemia -- Atorvastatin 20 mg p.o. daily  Essential tremor Follows with neurology outpatient, Dr. Arbutus Leas. -- Continue primidone 100 mg p.o. 3 times daily  DVT prophylaxis: enoxaparin (LOVENOX) injection 30 mg Start: 07/08/22 1800 SCDs Start: 07/08/22 1714    Code Status: Full Code Family Communication: Updated patient's spouse present at bedside  Disposition Plan:  Level of care: Med-Surg Status is: Inpatient Remains inpatient appropriate because: IV electrolyte replacement, pending likely surgical intervention requiring cholecystectomy      Consultants:  [facility] General surgery  Procedures:  EGD 6/22  Antimicrobials:  none    Subjective: Patient seen examined bedside, resting comfortably.  Husband present  at bedside.  Continues with mild epigastric tenderness  with oral intake.  Underwent EGD this morning with findings of gastritis/duodenitis with recommendation of surgical consultation for biliary colic.  Seen by general surgery, obtaining right upper quadrant ultrasound and anticipate likely need of cholecystectomy. Patient with no other complaints or concerns at this time.  Denies headache, no chest pain, no palpitations, no shortness of breath, no fever/chills/night sweats, no cough/congestion, no focal weakness, no fatigue, no paresthesias.  No acute events overnight per nursing staff.  Objective: Vitals:   07/10/22 0814 07/10/22 0831 07/10/22 0845 07/10/22 0914  BP: (!) 107/91 126/69 (!) 141/70 126/67  Pulse:  95 90 (!) 102  Resp: 10 16 11 18   Temp: (!) 97.4 F (36.3 C)  97.8 F (36.6 C) (!) 97.5 F (36.4 C)  TempSrc:    Oral  SpO2: 96% 95% 100% 96%  Weight:      Height:        Intake/Output Summary (Last 24 hours) at 07/10/2022 1249 Last data filed at 07/10/2022 0845 Gross per 24 hour  Intake 1536.27 ml  Output --  Net 1536.27 ml   Filed Weights   07/08/22 1115  Weight: 49.9 kg    Examination:  Physical Exam: GEN: NAD, alert and oriented x 3, elderly/chronically ill appearance HEENT: NCAT, PERRL, EOMI, sclera clear, MMM PULM: CTAB w/o wheezes/crackles, normal respiratory effort, on room air CV: RRR w/o M/G/R GI: abd soft, NTND, NABS, no R/G/M MSK: no peripheral edema, muscle strength globally intact 5/5 bilateral upper/lower extremities NEURO: CN II-XII intact, no focal deficits, sensation to light touch intact PSYCH: normal mood/affect Integumentary: dry/intact, no rashes or wounds    Data Reviewed: I have personally reviewed following labs and imaging studies  CBC: Recent Labs  Lab 07/08/22 1204 07/08/22 2059 07/09/22 0434  WBC 5.3 3.3* 3.1*  NEUTROABS 2.7  --   --   HGB 12.8 10.4* 9.6*  HCT 38.1 30.6* 28.4*  MCV 98.4 99.4 100.0  PLT 156 118* 108*    Basic Metabolic Panel: Recent Labs  Lab 07/08/22 1204 07/08/22 2059 07/09/22 0434 07/09/22 0948 07/10/22 0459  NA 135  --  138  --  135  K 2.8*  --  3.6  --  3.7  CL 110  --  118*  --  115*  CO2 16*  --  14*  --  15*  GLUCOSE 112*  --  89  --  92  BUN 60*  --  32*  --  13  CREATININE 1.76* 1.23* 1.07*  --  0.91  CALCIUM 7.3*  --  6.4*  --  6.1*  MG  --  1.8  --  1.7 1.9  PHOS  --   --   --  1.4* 2.1*   GFR: Estimated Creatinine Clearance: 38.8 mL/min (by C-G formula based on SCr of 0.91 mg/dL). Liver Function Tests: Recent Labs  Lab 07/08/22 1204 07/09/22 0434  AST 43* 35  ALT 23 18  ALKPHOS 85 62  BILITOT 0.7 0.7  PROT 6.2* 4.6*  ALBUMIN 3.1* 2.3*   Recent Labs  Lab 07/08/22 1312  LIPASE 54*   No results for input(s): "AMMONIA" in the last 168 hours. Coagulation Profile: Recent Labs  Lab 07/08/22 1204  INR 1.1   Cardiac Enzymes: No results for input(s): "CKTOTAL", "CKMB", "CKMBINDEX", "TROPONINI" in the last 168 hours. BNP (last 3 results) No results for input(s): "PROBNP" in the last 8760 hours. HbA1C: No results for input(s): "HGBA1C" in the last 72 hours. CBG: No results for input(s): "GLUCAP" in  the last 168 hours. Lipid Profile: No results for input(s): "CHOL", "HDL", "LDLCALC", "TRIG", "CHOLHDL", "LDLDIRECT" in the last 72 hours. Thyroid Function Tests: No results for input(s): "TSH", "T4TOTAL", "FREET4", "T3FREE", "THYROIDAB" in the last 72 hours. Anemia Panel: No results for input(s): "VITAMINB12", "FOLATE", "FERRITIN", "TIBC", "IRON", "RETICCTPCT" in the last 72 hours. Sepsis Labs: Recent Labs  Lab 07/08/22 1204  LATICACIDVEN 1.0    Recent Results (from the past 240 hour(s))  Blood Culture (routine x 2)     Status: None (Preliminary result)   Collection Time: 07/08/22 12:06 PM   Specimen: BLOOD LEFT ARM  Result Value Ref Range Status   Specimen Description   Final    BLOOD LEFT ARM Performed at [facility], [address], [place], [place] [address]    Special Requests   Final    BOTTLES DRAWN AEROBIC AND ANAEROBIC Blood Culture adequate volume Performed at [facility], [address] [place], [place] [address]    Culture   Final    NO GROWTH 2 DAYS Performed at [facility], [address], [place], [place] [address]    Report Status PENDING  Incomplete  Blood Culture (routine x 2)     Status: None (Preliminary result)   Collection Time: 07/08/22 12:13 PM   Specimen: BLOOD LEFT FOREARM  Result Value Ref Range Status   Specimen Description   Final    BLOOD LEFT FOREARM Performed at [facility], [address], [place], [place] [address]    Special Requests   Final    BOTTLES DRAWN AEROBIC AND ANAEROBIC Blood Culture adequate volume Performed at [facility], [address] [place], [place] [address]    Culture   Final    NO GROWTH 2 DAYS Performed at [facility], [address], [place], [place] [address]    Report Status PENDING  Incomplete         Radiology Studies: CT ABDOMEN PELVIS WO CONTRAST  Result Date: 07/08/2022 CLINICAL DATA:  Nonlocalized abdominal pain EXAM: CT ABDOMEN AND PELVIS WITHOUT CONTRAST TECHNIQUE: Multidetector CT imaging of the abdomen and pelvis was performed following the standard protocol without IV contrast. RADIATION DOSE REDUCTION: This exam was performed according to the departmental dose-optimization program which includes automated exposure control, adjustment of the mA and/or kV according to patient size and/or use of iterative reconstruction technique. COMPARISON:  Noncontrast CT 08/13/2019 and older. FINDINGS: Lower chest: There is some linear opacity lung bases likely scar or atelectasis. No pleural effusion. Some breathing motion. Moderate hiatal hernia. Coronary artery calcifications are seen. Hepatobiliary: Stones in the nondilated gallbladder. There are some tiny  low-attenuation lesion seen in the liver which are too small to completely characterize although possibly benign cysts. No specific imaging follow-up. These were seen on the prior in retrospect. Pancreas: Moderate global atrophy of the pancreas. No pancreatic mass. Spleen: Normal in size without focal abnormality. Adrenals/Urinary Tract: The adrenal glands are preserved. No obstructing renal stone or collecting system dilatation. The ureters have normal course and caliber extending down to the bladder. Preserved contour to the urinary bladder. Prominent bilateral renal sinus fat. Preserved adrenal glands. Stomach/Bowel: Stomach is within normal limits. Appendix appears normal. No evidence of bowel wall thickening, distention, or inflammatory changes. Vascular/Lymphatic: Aortic atherosclerosis. No enlarged abdominal or pelvic lymph nodes. Reproductive: Uterus and bilateral adnexa are unremarkable. Other: No free air or free fluid. Musculoskeletal: Curvature of the spine with degenerative changes. Augmentation  cement once again seen at L2. Degenerative changes of the pelvis. IMPRESSION: No bowel obstruction, free air or free fluid. Gallstones. No obstructing renal stone Moderate hiatal hernia. Electronically Signed   By: Karen Kays M.D.   On: 07/08/2022 15:21   DG Chest 2 View  Result Date: 07/08/2022 CLINICAL DATA:  Sepsis. EXAM: CHEST - 2 VIEW COMPARISON:  Chest x-ray dated August 15, 2019. FINDINGS: The heart size and mediastinal contours are within normal limits. Normal pulmonary vascularity. Chronic hyperinflation. No focal consolidation, pleural effusion, or pneumothorax. No acute osseous abnormality. IMPRESSION: 1. No acute cardiopulmonary disease. COPD. Electronically Signed   By: Obie Dredge M.D.   On: 07/08/2022 13:45        Scheduled Meds:  amisulpride       atorvastatin  20 mg Oral Daily   enoxaparin (LOVENOX) injection  30 mg Subcutaneous Q24H   pantoprazole (PROTONIX) IV  40 mg  Intravenous Q24H   primidone  100 mg Oral TID   sodium bicarbonate  650 mg Oral BID   Continuous Infusions:  0.9 % NaCl with KCl 40 mEq / L Stopped (07/10/22 1119)   sodium phosphate 30 mmol in dextrose 5 % 250 mL infusion 30 mmol (07/10/22 1130)     LOS: 1 day    Time spent: 52 minutes spent on chart review, discussion with nursing staff, consultants, updating family and interview/physical exam; more than 50% of that time was spent in counseling and/or coordination of care.    Alvira Philips [place], DO [facility] Available via Epic secure chat 7am-7pm After these hours, please refer to coverage provider listed on [email or web address] 07/10/2022, 12:49 PM

## 2022-07-10 NOTE — Anesthesia Postprocedure Evaluation (Signed)
Anesthesia Post Note  Patient: Brenda [name]  Procedure(s) Performed: ESOPHAGOGASTRODUODENOSCOPY (EGD) WITH PROPOFOL (Left) BIOPSY     Patient location during evaluation: PACU Anesthesia Type: MAC Level of consciousness: awake and alert Pain management: pain level controlled Vital Signs Assessment: post-procedure vital signs reviewed and stable Respiratory status: spontaneous breathing Cardiovascular status: stable Anesthetic complications: no   No notable events documented.  Last Vitals:  Vitals:   07/10/22 0845 07/10/22 0914  BP: (!) 141/70 126/67  Pulse: 90 (!) 102  Resp: 11 18  Temp: 36.6 C (!) 36.4 C  SpO2: 100% 96%    Last Pain:  Vitals:   07/10/22 0915  TempSrc:   PainSc: 0-No pain                 Lewie Loron

## 2022-07-10 NOTE — Brief Op Note (Signed)
EGD showed moderate duodenitis and mild gastritis. Doubt these findings are related to her abdominal pain. Needs evaluation for biliary colic by surgery. Clear liquid diet. No further GI recs. Will f/u on path when available.

## 2022-07-10 NOTE — Interval H&P Note (Signed)
History and Physical Interval Note:  07/10/2022 7:41 AM  Brenda [name]  has presented today for surgery, with the diagnosis of abdominal pain, nausea, vomiting.  The various methods of treatment have been discussed with the patient and family. After consideration of risks, benefits and other options for treatment, the patient has consented to  Procedure(s): ESOPHAGOGASTRODUODENOSCOPY (EGD) WITH PROPOFOL (Left) as a surgical intervention.  The patient's history has been reviewed, patient examined, no change in status, stable for surgery.  I have reviewed the patient's chart and labs.  Questions were answered to the patient's satisfaction.     Shirley Friar

## 2022-07-11 ENCOUNTER — Inpatient Hospital Stay (HOSPITAL_COMMUNITY): Payer: Medicare PPO

## 2022-07-11 DIAGNOSIS — K8050 Calculus of bile duct without cholangitis or cholecystitis without obstruction: Secondary | ICD-10-CM | POA: Diagnosis not present

## 2022-07-11 LAB — PHOSPHORUS: Phosphorus: 2.4 mg/dL — ABNORMAL LOW (ref 2.5–4.6)

## 2022-07-11 LAB — RETICULOCYTES
Immature Retic Fract: 3.7 % (ref 2.3–15.9)
RBC.: 2.67 MIL/uL — ABNORMAL LOW (ref 3.87–5.11)
Retic Count, Absolute: 22.7 10*3/uL (ref 19.0–186.0)
Retic Ct Pct: 0.9 % (ref 0.4–3.1)

## 2022-07-11 LAB — CBC
HCT: 26.6 % — ABNORMAL LOW (ref 36.0–46.0)
Hemoglobin: 8.8 g/dL — ABNORMAL LOW (ref 12.0–15.0)
MCH: 33.5 pg (ref 26.0–34.0)
MCHC: 33.1 g/dL (ref 30.0–36.0)
MCV: 101.1 fL — ABNORMAL HIGH (ref 80.0–100.0)
Platelets: 91 10*3/uL — ABNORMAL LOW (ref 150–400)
RBC: 2.63 MIL/uL — ABNORMAL LOW (ref 3.87–5.11)
RDW: 14 % (ref 11.5–15.5)
WBC: 3.4 10*3/uL — ABNORMAL LOW (ref 4.0–10.5)
nRBC: 0 % (ref 0.0–0.2)

## 2022-07-11 LAB — COMPREHENSIVE METABOLIC PANEL
ALT: 20 U/L (ref 0–44)
AST: 33 U/L (ref 15–41)
Albumin: 2.1 g/dL — ABNORMAL LOW (ref 3.5–5.0)
Alkaline Phosphatase: 55 U/L (ref 38–126)
Anion gap: 4 — ABNORMAL LOW (ref 5–15)
BUN: 6 mg/dL — ABNORMAL LOW (ref 8–23)
CO2: 19 mmol/L — ABNORMAL LOW (ref 22–32)
Calcium: 6.6 mg/dL — ABNORMAL LOW (ref 8.9–10.3)
Chloride: 115 mmol/L — ABNORMAL HIGH (ref 98–111)
Creatinine, Ser: 0.8 mg/dL (ref 0.44–1.00)
GFR, Estimated: >60 mL/min (ref >60)
Glucose, Bld: 96 mg/dL (ref 70–99)
Potassium: 4.5 mmol/L (ref 3.5–5.1)
Sodium: 138 mmol/L (ref 135–145)
Total Bilirubin: 0.5 mg/dL (ref 0.3–1.2)
Total Protein: 4.5 g/dL — ABNORMAL LOW (ref 6.5–8.1)

## 2022-07-11 LAB — CALCIUM, IONIZED: Calcium, Ionized, Serum: 3.9 mg/dL — ABNORMAL LOW (ref 4.5–5.6)

## 2022-07-11 LAB — MAGNESIUM: Magnesium: 1.4 mg/dL — ABNORMAL LOW (ref 1.7–2.4)

## 2022-07-11 MED ORDER — CALCIUM GLUCONATE-NACL 2-0.675 GM/100ML-% IV SOLN
2.0000 g | Freq: Once | INTRAVENOUS | Status: AC
  Administered 2022-07-11: 2000 mg via INTRAVENOUS
  Filled 2022-07-11 (×2): qty 100

## 2022-07-11 MED ORDER — SODIUM PHOSPHATES 45 MMOLE/15ML IV SOLN
30.0000 mmol | Freq: Once | INTRAVENOUS | Status: AC
  Administered 2022-07-11: 30 mmol via INTRAVENOUS
  Filled 2022-07-11: qty 10

## 2022-07-11 MED ORDER — MAGNESIUM SULFATE 4 GM/100ML IV SOLN
4.0000 g | Freq: Once | INTRAVENOUS | Status: AC
  Administered 2022-07-11: 4 g via INTRAVENOUS
  Filled 2022-07-11: qty 100

## 2022-07-11 NOTE — Progress Notes (Signed)
Mobility Specialist - Progress Note   07/11/22 0853  Mobility  Activity Ambulated with assistance in hallway  Level of Assistance Standby assist, set-up cues, supervision of patient - no hands on  Assistive Device Front wheel walker  Distance Ambulated (ft) 300 ft  Range of Motion/Exercises Active  Activity Response Tolerated well  Mobility Referral Yes  $Mobility charge 1 Mobility  Mobility Specialist Start Time (ACUTE ONLY) [id number]  Mobility Specialist Stop Time (ACUTE ONLY) 0853  Mobility Specialist Time Calculation (min) (ACUTE ONLY) 19 min   Pt was found in bed and agreeable to ambulate. Became fatigued with session. Had x4 brief standing rest breaks. At EOS returned to bed with all needs met. Call bell in reach.  Billey Chang Mobility Specialist

## 2022-07-11 NOTE — Plan of Care (Signed)
  Problem: Clinical Measurements: Goal: Diagnostic test results will improve Outcome: Progressing   Problem: Activity: Goal: Risk for activity intolerance will decrease Outcome: Progressing   Problem: Coping: Goal: Level of anxiety will decrease Outcome: Progressing   Problem: Safety: Goal: Ability to remain free from injury will improve Outcome: Progressing   

## 2022-07-11 NOTE — Progress Notes (Addendum)
1 Day Post-Op   Subjective/Chief Complaint: Denies abdominal pain Reports that she just doesn't feel well overall   Objective: Vital signs in last 24 hours: Temp:  [97.4 F (36.3 C)-98.8 F (37.1 C)] 97.6 F (36.4 C) (06/23 0408) Pulse Rate:  [84-102] 84 (06/23 0408) Resp:  [10-18] 14 (06/23 0408) BP: (93-141)/(50-91) 118/61 (06/23 0408) SpO2:  [95 %-100 %] 100 % (06/23 0408) Last BM Date : 07/10/22  Intake/Output from previous day: 06/22 0701 - 06/23 0700 In: 1370.2 [P.O.:240; I.V.:1022.4; IV Piggyback:107.8] Out: -  Intake/Output this shift: No intake/output data recorded.  Exam: Awake, follow commands Abdomen soft, non-tender  Lab Results:  Recent Labs    07/09/22 0434 07/11/22 0357  WBC 3.1* 3.4*  HGB 9.6* 8.8*  HCT 28.4* 26.6*  PLT 108* 91*   BMET Recent Labs    07/10/22 0459 07/11/22 0357  NA 135 138  K 3.7 4.5  CL 115* 115*  CO2 15* 19*  GLUCOSE 92 96  BUN 13 6*  CREATININE 0.91 0.80  CALCIUM 6.1* 6.6*   PT/INR Recent Labs    07/08/22 1204  LABPROT 14.7  INR 1.1   ABG Recent Labs    07/09/22 0712  HCO3 14.3*    Studies/Results: US ABDOMEN LIMITED RUQ (LIVER/GB)  Result Date: 07/11/2022 CLINICAL DATA:  Biliary colic EXAM: ULTRASOUND ABDOMEN LIMITED RIGHT UPPER QUADRANT COMPARISON:  Abdominal CT from 3 days ago FINDINGS: Gallbladder: Thickened gallbladder wall but under distended. At least 1 gallstone measuring 9 mm. No focal tenderness or pericholecystic edema Common bile duct: Diameter: 3 mm.  Where visualized, no filling defect. Liver: No focal lesion identified. Within normal limits in parenchymal echogenicity. Portal vein is patent on color Doppler imaging with normal direction of blood flow towards the liver. IMPRESSION: 1. Cholelithiasis. 2. Suspicion for nonobstructing distal CBD stone by CT but not detected on this exam. Electronically Signed   By: Tiburcio Pea M.D.   On: 07/11/2022 07:35    Anti-infectives: Anti-infectives  (From admission, onward)    None       Assessment/Plan: Chronic abdominal complaints, gallstones, mild gastritis and duodenitis, recurrent hiatal hernia  Ultrasound shows a single 9 mm stone, some thickened wall but otherwise no evidence of cholecystitis.  CBD without evidence of stone.  Decreasing hgb and platelets not likely from the gallbladder issue. Awaiting path on the biopsies at endo  Will make NPO at midnight in case our acute care surgeon of the week decides to proceed with surgery tomorrow  Complex medical decision making    LOS: 2 days    Abigail Miyamoto MD 07/11/2022

## 2022-07-11 NOTE — Progress Notes (Signed)
PROGRESS NOTE    Brenda [name]  QMV:[id number] DOB: [birth year] DOA: 07/08/2022 PCP: Clovis Riley, L.August Saucer, MD    Brief Narrative:   Brenda [name] is a 82 y.o. female with past medical history significant for irritable bowel syndrome, chronic diarrhea, essential tremor, HTN, HLD, GERD, hiatal hernia s/p Nissen fundoplication 2017, anxiety/depression who presented to [facility] ED on 6/20 from [facility] nursing facility complaining of nausea, vomiting, abdominal pain ongoing and progressive over the last month.  Patient denies dysphagia, trouble with solids as she is able to initially tolerate but vomits thereafter.  She also reports subjective weight loss but denies hematemesis, no melena, no hematochezia.  Is followed by gastroenterology outpatient, and has been diagnosed with IBS, chronic diarrhea, and abnormal esophageal motility.  Patient currently uses budesonide, previously used cholestyramine but did tolerate the "powder".  In the ED, temperature 97.5 F, HR 82, RR 18, BP 92/46, SpO2 100% on room air.  WBC 5.3, hemoglobin 12.8, platelets 156.  Sodium 135, potassium 2.8, chloride 110, CO2 16, glucose 112, BUN 60, creatinine 1.76.  AST 43, ALT 23, total bilirubin 0.7.  Calcium 7.3.  Albumin 3.1.  Urinalysis unrevealing.  Chest x-ray with no acute cardiopulmonary disease process, noted chronic hyperinflation related to COPD.  Lactic acid 1.0.  Patient was given potassium replacement and [facility] consulted for admission for further evaluation and management.  Assessment & Plan:   Abdominal pain associate with nausea/vomiting 2/2 biliary colic Hx hiatal hernia s/p Nissen fundoplication Patient presenting with progressive abdominal pain associate with nausea/vomiting, especially after eating meals.  Endorses associated subjective weight loss.  Has longstanding history of IBS, chronic diarrhea, history of hiatal hernia s/p Nissen fundoplication.  Follows with [facility] outpatient.  EGD 2019 notable  for abnormal esophageal motility, moderate hiatal hernia, gastritis.  GI concerned about symptoms consistent with biliary colic.  Underwent EGD on 6/22 with findings of gastritis/duodenitis; [facility] signed off with recommendation of surgical consultation.  Right upper quadrant ultrasound with cholelithiasis with at least 1 gallstone measuring 9 mm.  -- General surgery following, appreciate assistance -- Protonix 40 mg IV every 24 hours -- Full liquid diet, n.p.o. after midnight for possible cholecystectomy tomorrow  Acute renal failure: Resolved Metabolic acidosis: Improving Creatinine 1.76 on admission, likely secondary to prerenal azotemia in setting of dehydration and poor oral intake in days preceding hospitalization. -- Cr 1.76>1.23>1.07>0.91>0.80 -- CO2 16>14>>19 -- Discontinue IV fluids today -- Continue sodium bicarbonate 650 mg p.o. twice daily -- BMP in a.m.  Hypokalemia Etiology likely secondary to GI loss from nausea/vomiting, underlying chronic diarrhea.  Repleted. -- K 2.8>3.6>3.7>4.5 -- Repeat electrolytes in a.m.  Hypocalcemia 2/2 hypoalbuminemia Calcium 6.6, corrected for hypoalbuminemia of 2.1 = 8.1.  Magnesium 1.6, vitamin D 25-hydroxy level within normal limits. -- Ionized calcium: 3.9 -- Intact PTH: Pending -- Calcium gluconate 2 g IV -- Repeat CMP in a.m.  Hypomagnesemia Magnesium 1.4, will replete. -- Repeat magnesium level in a.m.  Hypophosphatemia Phosphorus 2.4, will replete with 30 mmol sodium phosphate IV -- Repeat electrolytes in a.m.  Pancytopenia Unclear etiology, MCV 101.1.  WBC 3.4, hemoglobin 8.8, RBC 263, platelet count 91.  Query marrow suppression. -- Check anemia panel -- Discontinue Lovenox -- Continue to monitor CBC, may need outpatient follow-up with hematology, consideration bone marrow biopsy  Essential hypertension Patient with slight hypotension on admission, likely secondary to volume depletion.  IV fluids now discontinued --  Hold home antihypertensives  Hyperlipidemia -- Atorvastatin 20 mg p.o. daily  Essential  tremor Follows with neurology outpatient, Dr. Arbutus Leas. -- Continue primidone 100 mg p.o. 3 times daily  DVT prophylaxis: Place and maintain sequential compression device Start: 07/11/22 1329 SCDs Start: 07/08/22 1714    Code Status: Full Code Family Communication: No family present at bedside this morning  Disposition Plan:  Level of care: Med-Surg Status is: Inpatient Remains inpatient appropriate because: IV electrolyte replacement, pending likely surgical intervention requiring cholecystectomy   Consultants:  [facility] General surgery  Procedures:  EGD 6/22  Antimicrobials:  none    Subjective: Patient seen examined bedside, resting comfortably.  Lying in bed.  Anxious.  Right upper quadrant ultrasound completed this morning.  Pending possible cholecystectomy tomorrow.  Patient with no other complaints or concerns at this time.  Denies headache, no chest pain, no palpitations, no shortness of breath, no fever/chills/night sweats, no cough/congestion, no focal weakness, no fatigue, no paresthesias.  No acute events overnight per nursing staff.  Objective: Vitals:   07/10/22 1503 07/10/22 2104 07/11/22 0408 07/11/22 1141  BP: (!) 106/57 (!) 93/50 118/61 111/67  Pulse: 91 90 84 85  Resp: 18 16 14 20   Temp: 98.8 F (37.1 C) 98.2 F (36.8 C) 97.6 F (36.4 C) 97.7 F (36.5 C)  TempSrc: Oral Oral Oral Oral  SpO2: 96% 97% 100% 100%  Weight:      Height:        Intake/Output Summary (Last 24 hours) at 07/11/2022 1332 Last data filed at 07/11/2022 1300 Gross per 24 hour  Intake 1210.18 ml  Output --  Net 1210.18 ml   Filed Weights   07/08/22 1115  Weight: 49.9 kg    Examination:  Physical Exam: GEN: NAD, alert and oriented x 3, elderly/chronically ill appearance HEENT: NCAT, PERRL, EOMI, sclera clear, MMM PULM: CTAB w/o wheezes/crackles, normal respiratory effort, on room air  with SpO2 100% on room air CV: RRR w/o M/G/R GI: abd soft, NTND, NABS, no R/G/M MSK: no peripheral edema, muscle strength globally intact 5/5 bilateral upper/lower extremities NEURO: CN II-XII intact, no focal deficits, sensation to light touch intact PSYCH: normal mood/affect Integumentary: dry/intact, no rashes or wounds    Data Reviewed: I have personally reviewed following labs and imaging studies  CBC: Recent Labs  Lab 07/08/22 1204 07/08/22 2059 07/09/22 0434 07/11/22 0357  WBC 5.3 3.3* 3.1* 3.4*  NEUTROABS 2.7  --   --   --   HGB 12.8 10.4* 9.6* 8.8*  HCT 38.1 30.6* 28.4* 26.6*  MCV 98.4 99.4 100.0 101.1*  PLT 156 118* 108* 91*   Basic Metabolic Panel: Recent Labs  Lab 07/08/22 1204 07/08/22 2059 07/09/22 0434 07/09/22 0948 07/10/22 0459 07/11/22 0357  NA 135  --  138  --  135 138  K 2.8*  --  3.6  --  3.7 4.5  CL 110  --  118*  --  115* 115*  CO2 16*  --  14*  --  15* 19*  GLUCOSE 112*  --  89  --  92 96  BUN 60*  --  32*  --  13 6*  CREATININE 1.76* 1.23* 1.07*  --  0.91 0.80  CALCIUM 7.3*  --  6.4*  --  6.1* 6.6*  MG  --  1.8  --  1.7 1.9 1.4*  PHOS  --   --   --  1.4* 2.1* 2.4*   GFR: Estimated Creatinine Clearance: 44.2 mL/min (by C-G formula based on SCr of 0.8 mg/dL). Liver Function Tests: Recent Labs  Lab  07/08/22 1204 07/09/22 0434 07/11/22 0357  AST 43* 35 33  ALT 23 18 20   ALKPHOS 85 62 55  BILITOT 0.7 0.7 0.5  PROT 6.2* 4.6* 4.5*  ALBUMIN 3.1* 2.3* 2.1*   Recent Labs  Lab 07/08/22 1312  LIPASE 54*   No results for input(s): "AMMONIA" in the last 168 hours. Coagulation Profile: Recent Labs  Lab 07/08/22 1204  INR 1.1   Cardiac Enzymes: No results for input(s): "CKTOTAL", "CKMB", "CKMBINDEX", "TROPONINI" in the last 168 hours. BNP (last 3 results) No results for input(s): "PROBNP" in the last 8760 hours. HbA1C: No results for input(s): "HGBA1C" in the last 72 hours. CBG: No results for input(s): "GLUCAP" in the last 168  hours. Lipid Profile: No results for input(s): "CHOL", "HDL", "LDLCALC", "TRIG", "CHOLHDL", "LDLDIRECT" in the last 72 hours. Thyroid Function Tests: No results for input(s): "TSH", "T4TOTAL", "FREET4", "T3FREE", "THYROIDAB" in the last 72 hours. Anemia Panel: No results for input(s): "VITAMINB12", "FOLATE", "FERRITIN", "TIBC", "IRON", "RETICCTPCT" in the last 72 hours. Sepsis Labs: Recent Labs  Lab 07/08/22 1204  LATICACIDVEN 1.0    Recent Results (from the past 240 hour(s))  Blood Culture (routine x 2)     Status: None (Preliminary result)   Collection Time: 07/08/22 12:06 PM   Specimen: BLOOD LEFT ARM  Result Value Ref Range Status   Specimen Description   Final    BLOOD LEFT ARM Performed at [facility], [address], [place], [place] [address]    Special Requests   Final    BOTTLES DRAWN AEROBIC AND ANAEROBIC Blood Culture adequate volume Performed at [facility], [address], [place], [place] [address]    Culture   Final    NO GROWTH 3 DAYS Performed at [facility], [address] [place], [place] [address]    Report Status PENDING  Incomplete  Blood Culture (routine x 2)     Status: None (Preliminary result)   Collection Time: 07/08/22 12:13 PM   Specimen: BLOOD LEFT FOREARM  Result Value Ref Range Status   Specimen Description   Final    BLOOD LEFT FOREARM Performed at [facility], [address] [place], [place] [address]    Special Requests   Final    BOTTLES DRAWN AEROBIC AND ANAEROBIC Blood Culture adequate volume Performed at [facility], [address], [place], [place] [address]    Culture   Final    NO GROWTH 3 DAYS Performed at [facility], [address] [place], [place] [address]    Report Status PENDING  Incomplete         Radiology Studies: US ABDOMEN LIMITED RUQ (LIVER/GB)  Result Date: 07/11/2022 CLINICAL DATA:  Biliary colic EXAM:  ULTRASOUND ABDOMEN LIMITED RIGHT UPPER QUADRANT COMPARISON:  Abdominal CT from 3 days ago FINDINGS: Gallbladder: Thickened gallbladder wall but under distended. At least 1 gallstone measuring 9 mm. No focal tenderness or pericholecystic edema Common bile duct: Diameter: 3 mm.  Where visualized, no filling defect. Liver: No focal lesion identified. Within normal limits in parenchymal echogenicity. Portal vein is patent on color Doppler imaging with normal direction of blood flow towards the liver. IMPRESSION: 1. Cholelithiasis. 2. Suspicion for nonobstructing distal CBD stone by CT but not detected on this exam. Electronically Signed   By: Tiburcio Pea M.D.   On: 07/11/2022 07:35        Scheduled Meds:  atorvastatin  20 mg Oral Daily  DULoxetine  20 mg Oral Daily   pantoprazole (PROTONIX) IV  40 mg Intravenous Q24H   primidone  100 mg Oral TID   sodium bicarbonate  650 mg Oral BID   Continuous Infusions:  0.9 % NaCl with KCl 40 mEq / L Stopped (07/10/22 1119)   magnesium sulfate bolus IVPB 4 g (07/11/22 1318)   sodium phosphate 30 mmol in dextrose 5 % 250 mL infusion       LOS: 2 days    Time spent: 52 minutes spent on chart review, discussion with nursing staff, consultants, updating family and interview/physical exam; more than 50% of that time was spent in counseling and/or coordination of care.    Alvira Philips [place], DO [facility] Available via Epic secure chat 7am-7pm After these hours, please refer to coverage provider listed on [email or web address] 07/11/2022, 1:32 PM

## 2022-07-12 ENCOUNTER — Inpatient Hospital Stay (HOSPITAL_COMMUNITY): Payer: Medicare PPO

## 2022-07-12 ENCOUNTER — Encounter (HOSPITAL_COMMUNITY): Payer: Self-pay | Admitting: Gastroenterology

## 2022-07-12 DIAGNOSIS — K8050 Calculus of bile duct without cholangitis or cholecystitis without obstruction: Secondary | ICD-10-CM | POA: Diagnosis not present

## 2022-07-12 LAB — CBC
HCT: 26.7 % — ABNORMAL LOW (ref 36.0–46.0)
Hemoglobin: 8.8 g/dL — ABNORMAL LOW (ref 12.0–15.0)
MCH: 33.5 pg (ref 26.0–34.0)
MCHC: 33 g/dL (ref 30.0–36.0)
MCV: 101.5 fL — ABNORMAL HIGH (ref 80.0–100.0)
Platelets: 94 10*3/uL — ABNORMAL LOW (ref 150–400)
RBC: 2.63 MIL/uL — ABNORMAL LOW (ref 3.87–5.11)
RDW: 13.8 % (ref 11.5–15.5)
WBC: 3 10*3/uL — ABNORMAL LOW (ref 4.0–10.5)
nRBC: 0 % (ref 0.0–0.2)

## 2022-07-12 LAB — MAGNESIUM: Magnesium: 1.9 mg/dL (ref 1.7–2.4)

## 2022-07-12 LAB — COMPREHENSIVE METABOLIC PANEL
ALT: 18 U/L (ref 0–44)
AST: 27 U/L (ref 15–41)
Albumin: 2.2 g/dL — ABNORMAL LOW (ref 3.5–5.0)
Alkaline Phosphatase: 57 U/L (ref 38–126)
Anion gap: 7 (ref 5–15)
BUN: <5 mg/dL — ABNORMAL LOW (ref 8–23)
CO2: 18 mmol/L — ABNORMAL LOW (ref 22–32)
Calcium: 7 mg/dL — ABNORMAL LOW (ref 8.9–10.3)
Chloride: 113 mmol/L — ABNORMAL HIGH (ref 98–111)
Creatinine, Ser: 0.7 mg/dL (ref 0.44–1.00)
GFR, Estimated: >60 mL/min (ref >60)
Glucose, Bld: 107 mg/dL — ABNORMAL HIGH (ref 70–99)
Potassium: 3.9 mmol/L (ref 3.5–5.1)
Sodium: 138 mmol/L (ref 135–145)
Total Bilirubin: 0.5 mg/dL (ref 0.3–1.2)
Total Protein: 4.5 g/dL — ABNORMAL LOW (ref 6.5–8.1)

## 2022-07-12 LAB — IRON AND TIBC
Iron: 46 ug/dL (ref 28–170)
Saturation Ratios: 42 % — ABNORMAL HIGH (ref 10.4–31.8)
TIBC: 109 ug/dL — ABNORMAL LOW (ref 250–450)
UIBC: 63 ug/dL

## 2022-07-12 LAB — VITAMIN B12: Vitamin B-12: 642 pg/mL (ref 180–914)

## 2022-07-12 LAB — CULTURE, BLOOD (ROUTINE X 2)
Culture: NO GROWTH
Special Requests: ADEQUATE

## 2022-07-12 LAB — FOLATE: Folate: 11.4 ng/mL (ref >5.9)

## 2022-07-12 LAB — PHOSPHORUS: Phosphorus: 3.5 mg/dL (ref 2.5–4.6)

## 2022-07-12 LAB — FERRITIN: Ferritin: 201 ng/mL (ref 11–307)

## 2022-07-12 MED ORDER — ORAL CARE MOUTH RINSE: 15.0000 mL | OROMUCOSAL | Status: DC | PRN

## 2022-07-12 MED ORDER — MORPHINE SULFATE (PF) 2 MG/ML IV SOLN
2.0000 mg | Freq: Once | INTRAVENOUS | Status: AC
  Administered 2022-07-12: 2 mg via INTRAVENOUS

## 2022-07-12 MED ORDER — MORPHINE SULFATE (PF) 2 MG/ML IV SOLN
INTRAVENOUS | Status: AC
  Filled 2022-07-12: qty 1

## 2022-07-12 MED ORDER — TECHNETIUM TC 99M MEBROFENIN IV KIT
5.0600 | PACK | Freq: Once | INTRAVENOUS | Status: AC
  Administered 2022-07-12: 5.06 via INTRAVENOUS

## 2022-07-12 NOTE — Progress Notes (Signed)
General Surgery Follow Up Note  Subjective:    Overnight Issues:   Objective:  Vital signs for last 24 hours: Temp:  [97.7 F (36.5 C)-98.5 F (36.9 C)] 97.8 F (36.6 C) (06/24 0452) Pulse Rate:  [83-85] 83 (06/24 0452) Resp:  [20-24] 24 (06/24 0452) BP: (111-118)/(60-67) 118/67 (06/24 0452) SpO2:  [95 %-100 %] 98 % (06/24 0452)  Hemodynamic parameters for last 24 hours:    Intake/Output from previous day: 06/23 0701 - 06/24 0700 In: 580 [P.O.:480; IV Piggyback:100] Out: -   Intake/Output this shift: No intake/output data recorded.  Vent settings for last 24 hours:    Physical Exam:  Gen: comfortable, no distress Neuro: follows commands, alert, communicative HEENT: PERRL Neck: supple CV: RRR Pulm: unlabored breathing on RA Abd: soft, NT    GU: urine clear and yellow, +spontaneous void Extr: wwp, no edema  Results for orders placed or performed during the hospital encounter of 07/08/22 (from the past 24 hour(s))  Vitamin B12     Status: None   Collection Time: 07/12/22  3:58 AM  Result Value Ref Range   Vitamin B-12 642 180 - 914 pg/mL  Folate     Status: None   Collection Time: 07/12/22  3:58 AM  Result Value Ref Range   Folate 11.4 >5.9 ng/mL  Iron and TIBC     Status: Abnormal   Collection Time: 07/12/22  3:58 AM  Result Value Ref Range   Iron 46 28 - 170 ug/dL   TIBC 454 (L) 098 - 119 ug/dL   Saturation Ratios 42 (H) 10.4 - 31.8 %   UIBC 63 ug/dL  Ferritin     Status: None   Collection Time: 07/12/22  3:58 AM  Result Value Ref Range   Ferritin 201 11 - 307 ng/mL  CBC     Status: Abnormal   Collection Time: 07/12/22  3:58 AM  Result Value Ref Range   WBC 3.0 (L) 4.0 - 10.5 K/uL   RBC 2.63 (L) 3.87 - 5.11 MIL/uL   Hemoglobin 8.8 (L) 12.0 - 15.0 g/dL   HCT 14.7 (L) 82.9 - 56.2 %   MCV 101.5 (H) 80.0 - 100.0 fL   MCH 33.5 26.0 - 34.0 pg   MCHC 33.0 30.0 - 36.0 g/dL   RDW 13.0 86.5 - 78.4 %   Platelets 94 (L) 150 - 400 K/uL   nRBC 0.0 0.0 -  0.2 %  Comprehensive metabolic panel     Status: Abnormal   Collection Time: 07/12/22  3:58 AM  Result Value Ref Range   Sodium 138 135 - 145 mmol/L   Potassium 3.9 3.5 - 5.1 mmol/L   Chloride 113 (H) 98 - 111 mmol/L   CO2 18 (L) 22 - 32 mmol/L   Glucose, Bld 107 (H) 70 - 99 mg/dL   BUN <5 (L) 8 - 23 mg/dL   Creatinine, Ser 6.96 0.44 - 1.00 mg/dL   Calcium 7.0 (L) 8.9 - 10.3 mg/dL   Total Protein 4.5 (L) 6.5 - 8.1 g/dL   Albumin 2.2 (L) 3.5 - 5.0 g/dL   AST 27 15 - 41 U/L   ALT 18 0 - 44 U/L   Alkaline Phosphatase 57 38 - 126 U/L   Total Bilirubin 0.5 0.3 - 1.2 mg/dL   GFR, Estimated >29 >52 mL/min   Anion gap 7 5 - 15  Magnesium     Status: None   Collection Time: 07/12/22  3:58 AM  Result Value Ref  Range   Magnesium 1.9 1.7 - 2.4 mg/dL  Phosphorus     Status: None   Collection Time: 07/12/22  3:58 AM  Result Value Ref Range   Phosphorus 3.5 2.5 - 4.6 mg/dL    Assessment & Plan:  Present on Admission:  Hypotension  Abdominal pain  Biliary colic    LOS: 3 days   Additional comments:I reviewed the patient's new clinical lab test results.   and I reviewed the patients new imaging test results.     Chest/abdominal pain - denies pain currently. Clearly states that pain is post-prandial and associated with vomiting and diaphoresis. She reports these symptoms with any PO intake and that symptoms have been occurring for the last month. Unclear whether these symptoms are biliary in nature or related to her recurrent hiatal hernia. Will obtain HIDA to better clarify. If HIDA negative, consider PO challenge and o/p f/u.  FEN - strict NPO DVT - SCDs, LMWH Dispo - med-surg    Diamantina Monks, MD Trauma & General Surgery Please use [email or web address] to contact on call provider  07/12/2022  *Care during the described time interval was provided by me. I have reviewed this patient's available data, including medical history, events of note, physical examination and test results as part  of my evaluation.

## 2022-07-12 NOTE — Progress Notes (Signed)
PROGRESS NOTE    Brenda [name]  WGN:[id number] DOB: [birth year] DOA: 07/08/2022 PCP: Clovis Riley, L.August Saucer, MD    Brief Narrative:   Brenda [name] is a 82 y.o. female with past medical history significant for irritable bowel syndrome, chronic diarrhea, essential tremor, HTN, HLD, GERD, hiatal hernia s/p Nissen fundoplication 2017, anxiety/depression who presented to [facility] ED on 6/20 from [facility] nursing facility complaining of nausea, vomiting, abdominal pain ongoing and progressive over the last month.  Patient denies dysphagia, trouble with solids as she is able to initially tolerate but vomits thereafter.  She also reports subjective weight loss but denies hematemesis, no melena, no hematochezia.  Is followed by gastroenterology outpatient, and has been diagnosed with IBS, chronic diarrhea, and abnormal esophageal motility.  Patient currently uses budesonide, previously used cholestyramine but did tolerate the "powder".  In the ED, temperature 97.5 F, HR 82, RR 18, BP 92/46, SpO2 100% on room air.  WBC 5.3, hemoglobin 12.8, platelets 156.  Sodium 135, potassium 2.8, chloride 110, CO2 16, glucose 112, BUN 60, creatinine 1.76.  AST 43, ALT 23, total bilirubin 0.7.  Calcium 7.3.  Albumin 3.1.  Urinalysis unrevealing.  Chest x-ray with no acute cardiopulmonary disease process, noted chronic hyperinflation related to COPD.  Lactic acid 1.0.  Patient was given potassium replacement and [facility] consulted for admission for further evaluation and management.  Assessment & Plan:   Abdominal pain associate with nausea/vomiting 2/2 biliary colic Hx hiatal hernia s/p Nissen fundoplication Patient presenting with progressive abdominal pain associate with nausea/vomiting, especially after eating meals.  Endorses associated subjective weight loss.  Has longstanding history of IBS, chronic diarrhea, history of hiatal hernia s/p Nissen fundoplication.  Follows with [facility] outpatient.  EGD 2019 notable  for abnormal esophageal motility, moderate hiatal hernia, gastritis.  GI concerned about symptoms consistent with biliary colic.  Underwent EGD on 6/22 with findings of gastritis/duodenitis; [facility] signed off with recommendation of surgical consultation.  Right upper quadrant ultrasound with cholelithiasis with at least 1 gallstone measuring 9 mm.  -- General surgery following, appreciate assistance -- Pathology from gastric biopsy: Pending -- Protonix 40 mg IV every 24 hours -- N.p.o. pending HIDA scan  Acute renal failure: Resolved Metabolic acidosis: Improving Creatinine 1.76 on admission, likely secondary to prerenal azotemia in setting of dehydration and poor oral intake in days preceding hospitalization. -- Cr 1.76>1.23>1.07>0.91>0.80 -- CO2 16>14>>18 -- Continue sodium bicarbonate 650 mg p.o. twice daily -- BMP in a.m.  Hypokalemia Etiology likely secondary to GI loss from nausea/vomiting, underlying chronic diarrhea.  Repleted. -- K 2.8>3.6>3.7>4.5>3.9 -- Repeat electrolytes in a.m.  Hypocalcemia 2/2 hypoalbuminemia Calcium 6.6, corrected for hypoalbuminemia of 2.1 = 8.1.  Magnesium 1.6, vitamin D 25-hydroxy level within normal limits. -- Ionized calcium: 3.9 -- Intact PTH: Pending -- Calcium gluconate 2 g IV -- Repeat CMP in a.m.  Hypomagnesemia Repleted, magnesium 1.9 today. -- Repeat magnesium level in a.m.  Hypophosphatemia Repleted, phosphorus 3.5 today. -- Repeat electrolytes in a.m.  Pancytopenia Unclear etiology, MCV 101.1.  WBC 3.4, hemoglobin 8.8, RBC 263, platelet count 91.  Query marrow suppression.  Anemia panel with iron 46, TIBC 109, ferritin 201, folate 11.4, vitamin B12 642. -- Discontinue Lovenox -- Continue to monitor CBC, may need outpatient follow-up with hematology, consideration bone marrow biopsy  Essential hypertension Patient with slight hypotension on admission, likely secondary to volume depletion.  IV fluids now discontinued -- Hold home  antihypertensives  Hyperlipidemia -- Atorvastatin 20 mg p.o. daily  Essential  tremor Follows with neurology outpatient, Dr. Arbutus Leas. -- Continue primidone 100 mg p.o. 3 times daily  DVT prophylaxis: Place and maintain sequential compression device Start: 07/11/22 1329 SCDs Start: 07/08/22 1714    Code Status: Full Code Family Communication: No family present at bedside this morning  Disposition Plan:  Level of care: Med-Surg Status is: Inpatient Remains inpatient appropriate because: IV electrolyte replacement, pending HIDA scan   Consultants:  [facility] General surgery  Procedures:  EGD 6/22  Antimicrobials:  none    Subjective: Patient seen examined bedside, resting comfortably.  Lying in bed.  Sleeping but arousable.  Seen by general surgery this morning, HIDA scan ordered for evaluation of possible biliary colic.  Depending on results may need cholecystectomy versus diet challenge with outpatient follow-up.  Patient with no other complaints or concerns at this time.  Denies headache, no chest pain, no palpitations, no shortness of breath, no fever/chills/night sweats, no cough/congestion, no focal weakness, no fatigue, no paresthesias.  No acute events overnight per nursing staff.  Objective: Vitals:   07/11/22 0408 07/11/22 1141 07/11/22 2103 07/12/22 0452  BP: 118/61 111/67 117/60 118/67  Pulse: 84 85 85 83  Resp: 14 20 (!) 21 (!) 24  Temp: 97.6 F (36.4 C) 97.7 F (36.5 C) 98.5 F (36.9 C) 97.8 F (36.6 C)  TempSrc: Oral Oral Oral Oral  SpO2: 100% 100% 95% 98%  Weight:      Height:        Intake/Output Summary (Last 24 hours) at 07/12/2022 1115 Last data filed at 07/12/2022 0700 Gross per 24 hour  Intake 460 ml  Output --  Net 460 ml   Filed Weights   07/08/22 1115  Weight: 49.9 kg    Examination:  Physical Exam: GEN: NAD, alert and oriented x 3, elderly/chronically ill appearance HEENT: NCAT, PERRL, EOMI, sclera clear, MMM PULM: CTAB w/o  wheezes/crackles, normal respiratory effort, on room air with SpO2 100% on room air CV: RRR w/o M/G/R GI: abd soft, NTND, NABS, no R/G/M MSK: no peripheral edema, muscle strength globally intact 5/5 bilateral upper/lower extremities NEURO: CN II-XII intact, no focal deficits, sensation to light touch intact PSYCH: normal mood/affect Integumentary: dry/intact, no rashes or wounds    Data Reviewed: I have personally reviewed following labs and imaging studies  CBC: Recent Labs  Lab 07/08/22 1204 07/08/22 2059 07/09/22 0434 07/11/22 0357 07/12/22 0358  WBC 5.3 3.3* 3.1* 3.4* 3.0*  NEUTROABS 2.7  --   --   --   --   HGB 12.8 10.4* 9.6* 8.8* 8.8*  HCT 38.1 30.6* 28.4* 26.6* 26.7*  MCV 98.4 99.4 100.0 101.1* 101.5*  PLT 156 118* 108* 91* 94*   Basic Metabolic Panel: Recent Labs  Lab 07/08/22 1204 07/08/22 2059 07/09/22 0434 07/09/22 0948 07/10/22 0459 07/11/22 0357 07/12/22 0358  NA 135  --  138  --  135 138 138  K 2.8*  --  3.6  --  3.7 4.5 3.9  CL 110  --  118*  --  115* 115* 113*  CO2 16*  --  14*  --  15* 19* 18*  GLUCOSE 112*  --  89  --  92 96 107*  BUN 60*  --  32*  --  13 6* <5*  CREATININE 1.76* 1.23* 1.07*  --  0.91 0.80 0.70  CALCIUM 7.3*  --  6.4*  --  6.1* 6.6* 7.0*  MG  --  1.8  --  1.7 1.9 1.4* 1.9  PHOS  --   --   --  1.4* 2.1* 2.4* 3.5   GFR: Estimated Creatinine Clearance: 44.2 mL/min (by C-G formula based on SCr of 0.7 mg/dL). Liver Function Tests: Recent Labs  Lab 07/08/22 1204 07/09/22 0434 07/11/22 0357 07/12/22 0358  AST 43* 35 33 27  ALT 23 18 20 18   ALKPHOS 85 62 55 57  BILITOT 0.7 0.7 0.5 0.5  PROT 6.2* 4.6* 4.5* 4.5*  ALBUMIN 3.1* 2.3* 2.1* 2.2*   Recent Labs  Lab 07/08/22 1312  LIPASE 54*   No results for input(s): "AMMONIA" in the last 168 hours. Coagulation Profile: Recent Labs  Lab 07/08/22 1204  INR 1.1   Cardiac Enzymes: No results for input(s): "CKTOTAL", "CKMB", "CKMBINDEX", "TROPONINI" in the last 168  hours. BNP (last 3 results) No results for input(s): "PROBNP" in the last 8760 hours. HbA1C: No results for input(s): "HGBA1C" in the last 72 hours. CBG: No results for input(s): "GLUCAP" in the last 168 hours. Lipid Profile: No results for input(s): "CHOL", "HDL", "LDLCALC", "TRIG", "CHOLHDL", "LDLDIRECT" in the last 72 hours. Thyroid Function Tests: No results for input(s): "TSH", "T4TOTAL", "FREET4", "T3FREE", "THYROIDAB" in the last 72 hours. Anemia Panel: Recent Labs    07/11/22 0357 07/12/22 0358  VITAMINB12  --  642  FOLATE  --  11.4  FERRITIN  --  201  TIBC  --  109*  IRON  --  46  RETICCTPCT 0.9  --    Sepsis Labs: Recent Labs  Lab 07/08/22 1204  LATICACIDVEN 1.0    Recent Results (from the past 240 hour(s))  Blood Culture (routine x 2)     Status: None (Preliminary result)   Collection Time: 07/08/22 12:06 PM   Specimen: BLOOD LEFT ARM  Result Value Ref Range Status   Specimen Description   Final    BLOOD LEFT ARM Performed at [facility], [address], [place], [place] [address]    Special Requests   Final    BOTTLES DRAWN AEROBIC AND ANAEROBIC Blood Culture adequate volume Performed at [facility], [address], [place], [place] [address]    Culture   Final    NO GROWTH 4 DAYS Performed at [facility], [address] [place], [place] [address]    Report Status PENDING  Incomplete  Blood Culture (routine x 2)     Status: None (Preliminary result)   Collection Time: 07/08/22 12:13 PM   Specimen: BLOOD LEFT FOREARM  Result Value Ref Range Status   Specimen Description   Final    BLOOD LEFT FOREARM Performed at [facility], [address] [place], [place] [address]    Special Requests   Final    BOTTLES DRAWN AEROBIC AND ANAEROBIC Blood Culture adequate volume Performed at [facility], [address], [place], [place] [address]    Culture   Final    NO  GROWTH 4 DAYS Performed at [facility], [address] [place], [place] [address]    Report Status PENDING  Incomplete         Radiology Studies: US ABDOMEN LIMITED RUQ (LIVER/GB)  Result Date: 07/11/2022 CLINICAL DATA:  Biliary colic EXAM: ULTRASOUND ABDOMEN LIMITED RIGHT UPPER QUADRANT COMPARISON:  Abdominal CT from 3 days ago FINDINGS: Gallbladder: Thickened gallbladder wall but under distended. At least 1 gallstone measuring 9 mm. No focal tenderness or pericholecystic edema Common bile duct: Diameter: 3 mm.  Where visualized, no filling defect. Liver: No focal lesion identified. Within normal limits in  parenchymal echogenicity. Portal vein is patent on color Doppler imaging with normal direction of blood flow towards the liver. IMPRESSION: 1. Cholelithiasis. 2. Suspicion for nonobstructing distal CBD stone by CT but not detected on this exam. Electronically Signed   By: Tiburcio Pea M.D.   On: 07/11/2022 07:35        Scheduled Meds:  atorvastatin  20 mg Oral Daily   DULoxetine  20 mg Oral Daily   pantoprazole (PROTONIX) IV  40 mg Intravenous Q24H   primidone  100 mg Oral TID   sodium bicarbonate  650 mg Oral BID   Continuous Infusions:     LOS: 3 days    Time spent: 52 minutes spent on chart review, discussion with nursing staff, consultants, updating family and interview/physical exam; more than 50% of that time was spent in counseling and/or coordination of care.    Alvira Philips [place], DO [facility] Available via Epic secure chat 7am-7pm After these hours, please refer to coverage provider listed on [email or web address] 07/12/2022, 11:15 AM

## 2022-07-12 NOTE — Care Management Important Message (Signed)
Important Message  Patient Details IM Letter given. Name: Brenda [name] MRN: [id number] Date of Birth: 02/22/1940   Medicare Important Message Given:  Yes     Caren Macadam 07/12/2022, 10:57 AM

## 2022-07-13 DIAGNOSIS — K8050 Calculus of bile duct without cholangitis or cholecystitis without obstruction: Secondary | ICD-10-CM | POA: Diagnosis not present

## 2022-07-13 LAB — PHOSPHORUS: Phosphorus: 1.7 mg/dL — ABNORMAL LOW (ref 2.5–4.6)

## 2022-07-13 LAB — CBC
HCT: 27.1 % — ABNORMAL LOW (ref 36.0–46.0)
Hemoglobin: 8.9 g/dL — ABNORMAL LOW (ref 12.0–15.0)
MCH: 33.8 pg (ref 26.0–34.0)
MCHC: 32.8 g/dL (ref 30.0–36.0)
MCV: 103 fL — ABNORMAL HIGH (ref 80.0–100.0)
Platelets: 93 10*3/uL — ABNORMAL LOW (ref 150–400)
RBC: 2.63 MIL/uL — ABNORMAL LOW (ref 3.87–5.11)
RDW: 13.7 % (ref 11.5–15.5)
WBC: 2.9 10*3/uL — ABNORMAL LOW (ref 4.0–10.5)
nRBC: 0 % (ref 0.0–0.2)

## 2022-07-13 LAB — COMPREHENSIVE METABOLIC PANEL
ALT: 16 U/L (ref 0–44)
AST: 23 U/L (ref 15–41)
Albumin: 2.1 g/dL — ABNORMAL LOW (ref 3.5–5.0)
Alkaline Phosphatase: 60 U/L (ref 38–126)
Anion gap: 8 (ref 5–15)
BUN: <5 mg/dL — ABNORMAL LOW (ref 8–23)
CO2: 17 mmol/L — ABNORMAL LOW (ref 22–32)
Calcium: 6.7 mg/dL — ABNORMAL LOW (ref 8.9–10.3)
Chloride: 113 mmol/L — ABNORMAL HIGH (ref 98–111)
Creatinine, Ser: 0.76 mg/dL (ref 0.44–1.00)
GFR, Estimated: >60 mL/min (ref >60)
Glucose, Bld: 84 mg/dL (ref 70–99)
Potassium: 3.5 mmol/L (ref 3.5–5.1)
Sodium: 138 mmol/L (ref 135–145)
Total Bilirubin: 0.7 mg/dL (ref 0.3–1.2)
Total Protein: 4.5 g/dL — ABNORMAL LOW (ref 6.5–8.1)

## 2022-07-13 LAB — CULTURE, BLOOD (ROUTINE X 2)

## 2022-07-13 LAB — MAGNESIUM: Magnesium: 1.4 mg/dL — ABNORMAL LOW (ref 1.7–2.4)

## 2022-07-13 LAB — SURGICAL PATHOLOGY

## 2022-07-13 MED ORDER — SODIUM PHOSPHATES 45 MMOLE/15ML IV SOLN
30.0000 mmol | Freq: Once | INTRAVENOUS | Status: AC
  Administered 2022-07-13: 30 mmol via INTRAVENOUS
  Filled 2022-07-13: qty 10

## 2022-07-13 MED ORDER — MAGNESIUM SULFATE 4 GM/100ML IV SOLN
4.0000 g | Freq: Once | INTRAVENOUS | Status: AC
  Administered 2022-07-13: 4 g via INTRAVENOUS
  Filled 2022-07-13: qty 100

## 2022-07-13 MED ORDER — PANTOPRAZOLE SODIUM 40 MG PO TBEC
40.0000 mg | DELAYED_RELEASE_TABLET | Freq: Every day | ORAL | Status: DC
  Administered 2022-07-13 – 2022-07-14 (×2): 40 mg via ORAL
  Filled 2022-07-13 (×2): qty 1

## 2022-07-13 NOTE — Progress Notes (Signed)
3 Days Post-Op  Subjective: CC: Patient reports she is tolerating cld without abdominal pain, chest pain or n/v. Bm x 2 yesterday.   Objective: Vital signs in last 24 hours: Temp:  [98.1 F (36.7 C)-98.5 F (36.9 C)] 98.3 F (36.8 C) (06/25 0518) Pulse Rate:  [84-94] 84 (06/25 0518) Resp:  [14-20] 14 (06/25 0518) BP: (105-116)/(53-83) 107/54 (06/25 0518) SpO2:  [96 %-99 %] 96 % (06/25 0518) Last BM Date : 07/12/22  Intake/Output from previous day: 06/24 0701 - 06/25 0700 In: 720 [P.O.:720] Out: -  Intake/Output this shift: Total I/O In: 360 [P.O.:360] Out: -   PE: Gen:  Alert, NAD, pleasant Abd: Soft, ND, NT, +BS  Lab Results:  Recent Labs    07/12/22 0358 07/13/22 0414  WBC 3.0* 2.9*  HGB 8.8* 8.9*  HCT 26.7* 27.1*  PLT 94* 93*   BMET Recent Labs    07/12/22 0358 07/13/22 0414  NA 138 138  K 3.9 3.5  CL 113* 113*  CO2 18* 17*  GLUCOSE 107* 84  BUN <5* <5*  CREATININE 0.70 0.76  CALCIUM 7.0* 6.7*   PT/INR No results for input(s): "LABPROT", "INR" in the last 72 hours. CMP     Component Value Date/Time   NA 138 07/13/2022 0414   K 3.5 07/13/2022 0414   CL 113 (H) 07/13/2022 0414   CO2 17 (L) 07/13/2022 0414   GLUCOSE 84 07/13/2022 0414   BUN <5 (L) 07/13/2022 0414   CREATININE 0.76 07/13/2022 0414   CALCIUM 6.7 (L) 07/13/2022 0414   PROT 4.5 (L) 07/13/2022 0414   ALBUMIN 2.1 (L) 07/13/2022 0414   AST 23 07/13/2022 0414   ALT 16 07/13/2022 0414   ALKPHOS 60 07/13/2022 0414   BILITOT 0.7 07/13/2022 0414   GFRNONAA >60 07/13/2022 0414   GFRAA >60 08/22/2019 0445   Lipase     Component Value Date/Time   LIPASE 54 (H) 07/08/2022 1312    Studies/Results: NM Hepatobiliary Liver Func  Result Date: 07/12/2022 CLINICAL DATA:  Cholelithiasis.  Evaluate for cholecystitis. EXAM: NUCLEAR MEDICINE HEPATOBILIARY IMAGING TECHNIQUE: Sequential images of the abdomen were obtained out to 60 minutes following intravenous administration of  radiopharmaceutical. RADIOPHARMACEUTICALS:  5.1 mCi Tc-49m  Choletec IV COMPARISON:  None Available. FINDINGS: Uniform uptake of radiotracer in the liver. Counts are evident in the small bowel by 15 minutes. At 60 minutes, IV morphine was administered to augment filling of the gallbladder. Gallbladder fills following morphine augmentation. IMPRESSION: 1. Patent cystic duct and common bile duct. 2. No evidence of acute cholecystitis. Electronically Signed   By: Genevive Bi M.D.   On: 07/12/2022 15:38    Anti-infectives: Anti-infectives (From admission, onward)    None        Assessment/Plan Chest/Abdominal pain - CT w/ cholelithiasis. RUQ [place] report w/o Cholecystitis but mentions suspicion for non-obstructing distal CBD stone. LFT's are wnl. Will discuss with MD if needs Gi re-engagement/MRCP. HIDA negative for Acute Cholecystitis.  - CT also w/ moderate hiatal hernia. Hx of HH repair with nissen fundoplication by Dr. Derrell Lolling in 2017. EGD 6/22 reviewed. Gastritis, now on PPI. Bx pending. Tolerating CLD and having bowel function.  - Patient reports previously symptoms only occurred with solids. Will discuss case with MD. If no plans to further evaluate possible non-obstructing distal CBD stone noted on [place] report, will adv to FLD and see how she tolerates. May need to stay on FLD + shakes until can f/u to discuss elective repair.  FEN - FLD as above, IVF VTE - SCDs, okay for chem ppx from our standpoint ID - None indicated from our standpoint   HTN HLD  I reviewed nursing notes, hospitalist notes, last 24 h vitals and pain scores, last 48 h intake and output, last 24 h labs and trends, and last 24 h imaging results.   LOS: 4 days    Jacinto Halim , [facility] 07/13/2022, 10:05 AM Please see Amion for pager number during day hours 7:00am-4:30pm

## 2022-07-13 NOTE — Progress Notes (Signed)

## 2022-07-13 NOTE — Progress Notes (Signed)
Mobility Specialist - Progress Note   07/13/22 1422  Mobility  Activity Ambulated with assistance in hallway;Ambulated with assistance to bathroom  Level of Assistance Standby assist, set-up cues, supervision of patient - no hands on  Assistive Device Front wheel walker  Distance Ambulated (ft) 200 ft  Range of Motion/Exercises Active  Activity Response Tolerated well  Mobility Referral Yes  $Mobility charge 1 Mobility  Mobility Specialist Start Time (ACUTE ONLY) 1400  Mobility Specialist Stop Time (ACUTE ONLY) 1422  Mobility Specialist Time Calculation (min) (ACUTE ONLY) 22 min   Pt was found in bed wanting to use bathroom and agreeable to ambulate afterwards. Grew fatigued with session and stated not being able to go far. At EOS was left sitting EOB with needs met and call bell in reach. Husband in room and NT notified.  Billey Chang Mobility Specialist

## 2022-07-13 NOTE — Progress Notes (Signed)
Unable to collect urine sample for Phosphorus sample. New hat in toilet, but patient has liquid diarrhea every time she urinates- unable to collect clean sample. MD [place] made aware- no need for I/O cath at this time per MD. Will continue to collect if possible.

## 2022-07-13 NOTE — Progress Notes (Signed)
PROGRESS NOTE    Brenda [name]  NWG:[id number] DOB: [birth year] DOA: 07/08/2022 PCP: Clovis Riley, L.August Saucer, MD    Brief Narrative:   Brenda [name] is a 82 y.o. female with past medical history significant for irritable bowel syndrome, chronic diarrhea, essential tremor, HTN, HLD, GERD, hiatal hernia s/p Nissen fundoplication 2017, anxiety/depression who presented to [facility] ED on 6/20 from [facility] nursing facility complaining of nausea, vomiting, abdominal pain ongoing and progressive over the last month.  Patient denies dysphagia, trouble with solids as she is able to initially tolerate but vomits thereafter.  She also reports subjective weight loss but denies hematemesis, no melena, no hematochezia.  Is followed by gastroenterology outpatient, and has been diagnosed with IBS, chronic diarrhea, and abnormal esophageal motility.  Patient currently uses budesonide, previously used cholestyramine but did tolerate the "powder".  In the ED, temperature 97.5 F, HR 82, RR 18, BP 92/46, SpO2 100% on room air.  WBC 5.3, hemoglobin 12.8, platelets 156.  Sodium 135, potassium 2.8, chloride 110, CO2 16, glucose 112, BUN 60, creatinine 1.76.  AST 43, ALT 23, total bilirubin 0.7.  Calcium 7.3.  Albumin 3.1.  Urinalysis unrevealing.  Chest x-ray with no acute cardiopulmonary disease process, noted chronic hyperinflation related to COPD.  Lactic acid 1.0.  Patient was given potassium replacement and [facility] consulted for admission for further evaluation and management.  Assessment & Plan:   Abdominal pain associate with nausea/vomiting 2/2 biliary colic Hx hiatal hernia s/p Nissen fundoplication Patient presenting with progressive abdominal pain associate with nausea/vomiting, especially after eating meals.  Endorses associated subjective weight loss.  Has longstanding history of IBS, chronic diarrhea, history of hiatal hernia s/p Nissen fundoplication.  Follows with [facility] outpatient.  EGD 2019 notable  for abnormal esophageal motility, moderate hiatal hernia, gastritis.  GI concerned about symptoms consistent with biliary colic.  Underwent EGD on 6/22 with findings of gastritis/duodenitis; [facility] signed off with recommendation of surgical consultation.  Right upper quadrant ultrasound with cholelithiasis with at least 1 gallstone measuring 9 mm.  HIDA scan with patent cystic and common bile duct, no evidence of acute cholecystitis. -- General surgery following, appreciate assistance -- Pathology from gastric biopsy: Pending -- Protonix 40 mg IV every 24 hours -- Diet advanced to full liquids per general surgery, currently not planning any operative management at this point and may need to follow-up outpatient for discussion of elective surgery.  If unable to tolerate solid diet, may need to utilize liquids/shakes for nutrition.  Acute renal failure: Resolved Metabolic acidosis:  Creatinine 1.76 on admission, likely secondary to prerenal azotemia in setting of dehydration and poor oral intake in days preceding hospitalization. -- Cr 1.76>1.23>1.07>0.91>0.80 -- Sodium bicarbonate 650 mg p.o. twice daily -- BMP in a.m.  Hypokalemia Etiology likely secondary to GI loss from nausea/vomiting, underlying chronic diarrhea.  Repleted. -- K 2.8>3.6>3.7>4.5>3.9 -- Repeat electrolytes in a.m.  Hypocalcemia 2/2 hypoalbuminemia Calcium 6.6, corrected for hypoalbuminemia of 2.1 = 8.1.  Magnesium 1.6, vitamin D 25-hydroxy level within normal limits.  Received calcium gluconate 2 g IV x 1. -- Ionized calcium: 3.9 -- Intact PTH: Pending -- Repeat CMP in a.m.  Hypomagnesemia Repleted, magnesium 1.4 today.  Will replete. -- Repeat magnesium level in a.m.  Hypophosphatemia Repleted, phosphorus 1.7 this morning, will replete -- Repeat electrolytes in a.m.  Pancytopenia Unclear etiology, MCV 101.1.  WBC 3.4, hemoglobin 8.8, RBC 263, platelet count 91.  Query marrow suppression.  Anemia panel with iron  46, TIBC 109, ferritin  201, folate 11.4, vitamin B12 642. -- Discontinue Lovenox -- Continue to monitor CBC, may need outpatient follow-up with hematology, consideration bone marrow biopsy  Essential hypertension Patient with slight hypotension on admission, likely secondary to volume depletion.  IV fluids now discontinued -- Hold home antihypertensives  Hyperlipidemia -- Atorvastatin 20 mg p.o. daily  Essential tremor Follows with neurology outpatient, Dr. Arbutus Leas. -- Continue primidone 100 mg p.o. 3 times daily  DVT prophylaxis: Place and maintain sequential compression device Start: 07/11/22 1329 SCDs Start: 07/08/22 1714    Code Status: Full Code Family Communication: No family present at bedside this morning  Disposition Plan:  Level of care: Med-Surg Status is: Inpatient Remains inpatient appropriate because: IV electrolyte replacement, advancing to full liquid diet today   Consultants:  [facility] General surgery  Procedures:  EGD 6/22  Antimicrobials:  none    Subjective: Patient seen examined bedside, resting comfortably.  Lying in bed.  Anxious, upset that she may not get "surgery" to fix her problem.  Diet advanced to full liquids.  Patient with no other complaints or concerns at this time.  Denies headache, no chest pain, no palpitations, no shortness of breath, no fever/chills/night sweats, no cough/congestion, no focal weakness, no fatigue, no paresthesias.  No acute events overnight per nursing staff.  Objective: Vitals:   07/12/22 0452 07/12/22 1456 07/12/22 2021 07/13/22 0518  BP: 118/67 105/83 (!) 116/53 (!) 107/54  Pulse: 83 94 84 84  Resp: (!) 24 20 16 14   Temp: 97.8 F (36.6 C) 98.1 F (36.7 C) 98.5 F (36.9 C) 98.3 F (36.8 C)  TempSrc: Oral Oral Oral Oral  SpO2: 98% 99% 99% 96%  Weight:      Height:        Intake/Output Summary (Last 24 hours) at 07/13/2022 1128 Last data filed at 07/13/2022 0932 Gross per 24 hour  Intake 1080 ml  Output --   Net 1080 ml   Filed Weights   07/08/22 1115  Weight: 49.9 kg    Examination:  Physical Exam: GEN: NAD, alert and oriented x 3, elderly/chronically ill appearance HEENT: NCAT, PERRL, EOMI, sclera clear, MMM PULM: CTAB w/o wheezes/crackles, normal respiratory effort, on room air with SpO2 100% on room air CV: RRR w/o M/G/R GI: abd soft, NTND, NABS, no R/G/M MSK: no peripheral edema, muscle strength globally intact 5/5 bilateral upper/lower extremities NEURO: CN II-XII intact, no focal deficits, sensation to light touch intact PSYCH: normal mood/affect Integumentary: dry/intact, no rashes or wounds    Data Reviewed: I have personally reviewed following labs and imaging studies  CBC: Recent Labs  Lab 07/08/22 1204 07/08/22 2059 07/09/22 0434 07/11/22 0357 07/12/22 0358 07/13/22 0414  WBC 5.3 3.3* 3.1* 3.4* 3.0* 2.9*  NEUTROABS 2.7  --   --   --   --   --   HGB 12.8 10.4* 9.6* 8.8* 8.8* 8.9*  HCT 38.1 30.6* 28.4* 26.6* 26.7* 27.1*  MCV 98.4 99.4 100.0 101.1* 101.5* 103.0*  PLT 156 118* 108* 91* 94* 93*   Basic Metabolic Panel: Recent Labs  Lab 07/09/22 0434 07/09/22 0948 07/10/22 0459 07/11/22 0357 07/12/22 0358 07/13/22 0414  NA 138  --  135 138 138 138  K 3.6  --  3.7 4.5 3.9 3.5  CL 118*  --  115* 115* 113* 113*  CO2 14*  --  15* 19* 18* 17*  GLUCOSE 89  --  92 96 107* 84  BUN 32*  --  13 6* <5* <5*  CREATININE  1.07*  --  0.91 0.80 0.70 0.76  CALCIUM 6.4*  --  6.1* 6.6* 7.0* 6.7*  MG  --  1.7 1.9 1.4* 1.9 1.4*  PHOS  --  1.4* 2.1* 2.4* 3.5 1.7*   GFR: Estimated Creatinine Clearance: 44.2 mL/min (by C-G formula based on SCr of 0.76 mg/dL). Liver Function Tests: Recent Labs  Lab 07/08/22 1204 07/09/22 0434 07/11/22 0357 07/12/22 0358 07/13/22 0414  AST 43* 35 33 27 23  ALT 23 18 20 18 16   ALKPHOS 85 62 55 57 60  BILITOT 0.7 0.7 0.5 0.5 0.7  PROT 6.2* 4.6* 4.5* 4.5* 4.5*  ALBUMIN 3.1* 2.3* 2.1* 2.2* 2.1*   Recent Labs  Lab 07/08/22 1312   LIPASE 54*   No results for input(s): "AMMONIA" in the last 168 hours. Coagulation Profile: Recent Labs  Lab 07/08/22 1204  INR 1.1   Cardiac Enzymes: No results for input(s): "CKTOTAL", "CKMB", "CKMBINDEX", "TROPONINI" in the last 168 hours. BNP (last 3 results) No results for input(s): "PROBNP" in the last 8760 hours. HbA1C: No results for input(s): "HGBA1C" in the last 72 hours. CBG: No results for input(s): "GLUCAP" in the last 168 hours. Lipid Profile: No results for input(s): "CHOL", "HDL", "LDLCALC", "TRIG", "CHOLHDL", "LDLDIRECT" in the last 72 hours. Thyroid Function Tests: No results for input(s): "TSH", "T4TOTAL", "FREET4", "T3FREE", "THYROIDAB" in the last 72 hours. Anemia Panel: Recent Labs    07/11/22 0357 07/12/22 0358  VITAMINB12  --  642  FOLATE  --  11.4  FERRITIN  --  201  TIBC  --  109*  IRON  --  46  RETICCTPCT 0.9  --    Sepsis Labs: Recent Labs  Lab 07/08/22 1204  LATICACIDVEN 1.0    Recent Results (from the past 240 hour(s))  Blood Culture (routine x 2)     Status: None   Collection Time: 07/08/22 12:06 PM   Specimen: BLOOD LEFT ARM  Result Value Ref Range Status   Specimen Description   Final    BLOOD LEFT ARM Performed at [facility], [address], [place], [place] [address]    Special Requests   Final    BOTTLES DRAWN AEROBIC AND ANAEROBIC Blood Culture adequate volume Performed at [facility], [address], [place], [place] [address]    Culture   Final    NO GROWTH 5 DAYS Performed at [facility], [address], [place], [place] [address]    Report Status 07/13/2022 FINAL  Final  Blood Culture (routine x 2)     Status: None   Collection Time: 07/08/22 12:13 PM   Specimen: BLOOD LEFT FOREARM  Result Value Ref Range Status   Specimen Description   Final    BLOOD LEFT FOREARM Performed at [facility], [address], [place], [place] [address]    Special  Requests   Final    BOTTLES DRAWN AEROBIC AND ANAEROBIC Blood Culture adequate volume Performed at [facility], [address], [place], [place] [address]    Culture   Final    NO GROWTH 5 DAYS Performed at [facility], [address] [place], [place] [address]    Report Status 07/13/2022 FINAL  Final         Radiology Studies: NM Hepatobiliary Liver Func  Result Date: 07/12/2022 CLINICAL DATA:  Cholelithiasis.  Evaluate for cholecystitis. EXAM: NUCLEAR MEDICINE HEPATOBILIARY IMAGING TECHNIQUE: Sequential images of the abdomen were obtained out to  60 minutes following intravenous administration of radiopharmaceutical. RADIOPHARMACEUTICALS:  5.1 mCi Tc-69m  Choletec IV COMPARISON:  None Available. FINDINGS: Uniform uptake of radiotracer in the liver. Counts are evident in the small bowel by 15 minutes. At 60 minutes, IV morphine was administered to augment filling of the gallbladder. Gallbladder fills following morphine augmentation. IMPRESSION: 1. Patent cystic duct and common bile duct. 2. No evidence of acute cholecystitis. Electronically Signed   By: Genevive Bi M.D.   On: 07/12/2022 15:38        Scheduled Meds:  atorvastatin  20 mg Oral Daily   DULoxetine  20 mg Oral Daily   pantoprazole (PROTONIX) IV  40 mg Intravenous Q24H   primidone  100 mg Oral TID   sodium bicarbonate  650 mg Oral BID   Continuous Infusions:  sodium phosphate 30 mmol in dextrose 5 % 250 mL infusion 30 mmol (07/13/22 1051)      LOS: 4 days    Time spent: 52 minutes spent on chart review, discussion with nursing staff, consultants, updating family and interview/physical exam; more than 50% of that time was spent in counseling and/or coordination of care.    Alvira Philips [place], DO [facility] Available via Epic secure chat 7am-7pm After these hours, please refer to coverage provider listed on [email or web address] 07/13/2022, 11:28 AM

## 2022-07-14 DIAGNOSIS — K8050 Calculus of bile duct without cholangitis or cholecystitis without obstruction: Secondary | ICD-10-CM | POA: Diagnosis not present

## 2022-07-14 LAB — COMPREHENSIVE METABOLIC PANEL
ALT: 16 U/L (ref 0–44)
AST: 24 U/L (ref 15–41)
Albumin: 2.2 g/dL — ABNORMAL LOW (ref 3.5–5.0)
Alkaline Phosphatase: 66 U/L (ref 38–126)
Anion gap: 6 (ref 5–15)
BUN: <5 mg/dL — ABNORMAL LOW (ref 8–23)
CO2: 22 mmol/L (ref 22–32)
Calcium: 6.7 mg/dL — ABNORMAL LOW (ref 8.9–10.3)
Chloride: 109 mmol/L (ref 98–111)
Creatinine, Ser: 0.75 mg/dL (ref 0.44–1.00)
GFR, Estimated: >60 mL/min (ref >60)
Glucose, Bld: 98 mg/dL (ref 70–99)
Potassium: 3.2 mmol/L — ABNORMAL LOW (ref 3.5–5.1)
Sodium: 137 mmol/L (ref 135–145)
Total Bilirubin: 0.3 mg/dL (ref 0.3–1.2)
Total Protein: 4.8 g/dL — ABNORMAL LOW (ref 6.5–8.1)

## 2022-07-14 LAB — CBC
HCT: 28.3 % — ABNORMAL LOW (ref 36.0–46.0)
Hemoglobin: 9.3 g/dL — ABNORMAL LOW (ref 12.0–15.0)
MCH: 32.9 pg (ref 26.0–34.0)
MCHC: 32.9 g/dL (ref 30.0–36.0)
MCV: 100 fL (ref 80.0–100.0)
Platelets: 107 10*3/uL — ABNORMAL LOW (ref 150–400)
RBC: 2.83 MIL/uL — ABNORMAL LOW (ref 3.87–5.11)
RDW: 13 % (ref 11.5–15.5)
WBC: 3 10*3/uL — ABNORMAL LOW (ref 4.0–10.5)
nRBC: 0 % (ref 0.0–0.2)

## 2022-07-14 LAB — PHOSPHORUS: Phosphorus: 2.8 mg/dL (ref 2.5–4.6)

## 2022-07-14 LAB — MAGNESIUM: Magnesium: 2 mg/dL (ref 1.7–2.4)

## 2022-07-14 MED ORDER — POTASSIUM CHLORIDE CRYS ER 20 MEQ PO TBCR
40.0000 meq | EXTENDED_RELEASE_TABLET | ORAL | Status: AC
  Administered 2022-07-14 (×2): 40 meq via ORAL
  Filled 2022-07-14 (×2): qty 2

## 2022-07-14 MED ORDER — SODIUM BICARBONATE 650 MG PO TABS: 650.0000 mg | ORAL_TABLET | Freq: Two times a day (BID) | ORAL | 0 refills | Status: AC

## 2022-07-14 MED ORDER — BOOST PLUS PO LIQD
237.0000 mL | Freq: Three times a day (TID) | ORAL | Status: DC
  Filled 2022-07-14: qty 237

## 2022-07-14 MED ORDER — CALCIUM CARBONATE ANTACID 500 MG PO CHEW: 2.0000 | CHEWABLE_TABLET | Freq: Every day | ORAL | 0 refills | Status: AC

## 2022-07-14 MED ORDER — PANTOPRAZOLE SODIUM 40 MG PO TBEC: 40.0000 mg | DELAYED_RELEASE_TABLET | Freq: Every day | ORAL | 1 refills | Status: AC

## 2022-07-14 NOTE — Progress Notes (Signed)
4 Days Post-Op  Subjective: CC: Patient reports she is tolerating FLD without abdominal pain, chest pain or n/v. Bm yesterday. No hematochezia or melena. No PRN pain or anti-nausea medications yesterday. Reports she has vanilla boost at home that she likes to drink.   Objective: Vital signs in last 24 hours: Temp:  [97.8 F (36.6 C)-98.2 F (36.8 C)] 98.2 F (36.8 C) (06/26 0418) Pulse Rate:  [82-86] 82 (06/26 0418) Resp:  [16-17] 16 (06/26 0418) BP: (95-156)/(68-92) 95/68 (06/26 0418) SpO2:  [92 %-97 %] 97 % (06/26 0418) Last BM Date : 07/14/22  Intake/Output from previous day: 06/25 0701 - 06/26 0700 In: 980 [P.O.:720; IV Piggyback:260] Out: -  Intake/Output this shift: Total I/O In: 240 [P.O.:240] Out: -   PE: Gen:  Alert, NAD, pleasant Abd: Soft, ND, NT, +BS  Lab Results:  Recent Labs    07/13/22 0414 07/14/22 0426  WBC 2.9* 3.0*  HGB 8.9* 9.3*  HCT 27.1* 28.3*  PLT 93* 107*    BMET Recent Labs    07/13/22 0414 07/14/22 0426  NA 138 137  K 3.5 3.2*  CL 113* 109  CO2 17* 22  GLUCOSE 84 98  BUN <5* <5*  CREATININE 0.76 0.75  CALCIUM 6.7* 6.7*    PT/INR No results for input(s): "LABPROT", "INR" in the last 72 hours. CMP     Component Value Date/Time   NA 137 07/14/2022 0426   K 3.2 (L) 07/14/2022 0426   CL 109 07/14/2022 0426   CO2 22 07/14/2022 0426   GLUCOSE 98 07/14/2022 0426   BUN <5 (L) 07/14/2022 0426   CREATININE 0.75 07/14/2022 0426   CALCIUM 6.7 (L) 07/14/2022 0426   PROT 4.8 (L) 07/14/2022 0426   ALBUMIN 2.2 (L) 07/14/2022 0426   AST 24 07/14/2022 0426   ALT 16 07/14/2022 0426   ALKPHOS 66 07/14/2022 0426   BILITOT 0.3 07/14/2022 0426   GFRNONAA >60 07/14/2022 0426   GFRAA >60 08/22/2019 0445   Lipase     Component Value Date/Time   LIPASE 54 (H) 07/08/2022 1312    Studies/Results: NM Hepatobiliary Liver Func  Result Date: 07/12/2022 CLINICAL DATA:  Cholelithiasis.  Evaluate for cholecystitis. EXAM: NUCLEAR  MEDICINE HEPATOBILIARY IMAGING TECHNIQUE: Sequential images of the abdomen were obtained out to 60 minutes following intravenous administration of radiopharmaceutical. RADIOPHARMACEUTICALS:  5.1 mCi Tc-32m  Choletec IV COMPARISON:  None Available. FINDINGS: Uniform uptake of radiotracer in the liver. Counts are evident in the small bowel by 15 minutes. At 60 minutes, IV morphine was administered to augment filling of the gallbladder. Gallbladder fills following morphine augmentation. IMPRESSION: 1. Patent cystic duct and common bile duct. 2. No evidence of acute cholecystitis. Electronically Signed   By: Genevive Bi M.D.   On: 07/12/2022 15:38    Anti-infectives: Anti-infectives (From admission, onward)    None        Assessment/Plan Chest/Abdominal pain Cholelithiasis  Hiatal Hernia  - CT w/ cholelithiasis. RUQ [place] report w/o Cholecystitis but mentions suspicion for non-obstructing distal CBD stone. LFT's are wnl. Discussed with MD who recommended outpatient f/u with GI. No plans for further evaluation or surgery during admission.   - CT also w/ moderate hiatal hernia. Hx of HH repair with nissen fundoplication by Dr. Derrell Lolling in 2017. EGD 6/22 reviewed. Gastritis, now on PPI. Bx pending. Patient now tolerating FLD. She reports she would only have symptoms with solid food in the past. Discussed with attending. Will plan to leave on FLD until  f/u with Dr. Derrell Lolling to discuss possible surgery for recurrence of her [facility].  - Okay for discharge from our standpoint on FLD. Will need GI f/u and f/u with Dr. Derrell Lolling (will arrange f/u with Dr. Derrell Lolling). Will let [facility] know.   FEN - FLD as above, add shakes, IVF VTE - SCDs, okay for chem ppx from our standpoint ID - None indicated from our standpoint   HTN HLD  I reviewed nursing notes, hospitalist notes, last 24 h vitals and pain scores, last 48 h intake and output, last 24 h labs and trends, and last 24 h imaging results.   LOS: 5 days     Jacinto Halim , [facility] 07/14/2022, 11:14 AM Please see Amion for pager number during day hours 7:00am-4:30pm

## 2022-07-14 NOTE — Plan of Care (Signed)
  Problem: Education: Goal: Knowledge of General Education information will improve Description Including pain rating scale, medication(s)/side effects and non-pharmacologic comfort measures Outcome: Progressing   Problem: Clinical Measurements: Goal: Ability to maintain clinical measurements within normal limits will improve Outcome: Progressing Goal: Will remain free from infection Outcome: Progressing Goal: Diagnostic test results will improve Outcome: Progressing Goal: Respiratory complications will improve Outcome: Progressing Goal: Cardiovascular complication will be avoided Outcome: Progressing   Problem: Elimination: Goal: Will not experience complications related to bowel motility Outcome: Progressing Goal: Will not experience complications related to urinary retention Outcome: Progressing   Problem: Pain Managment: Goal: General experience of comfort will improve Outcome: Progressing   Problem: Safety: Goal: Ability to remain free from injury will improve Outcome: Progressing   Problem: Skin Integrity: Goal: Risk for impaired skin integrity will decrease Outcome: Progressing   

## 2022-07-14 NOTE — Discharge Summary (Signed)
Physician Discharge Summary  Brenda [name] NFA:[id number] DOB: 07-May-1940 DOA: 07/08/2022  PCP: Brenda [name], Brenda Saucer, MD  Admit date: 07/08/2022 Discharge date: 07/14/2022  Time spent: 40 minutes  Recommendations for Outpatient Follow-up:  Follow outpatient CBC/CMP Follow with surgery outpatient (continue full liquid diet for now) Follow with [name] outpatient Follow metabolic acidosis outpatient, suspect the bicarb will be able to be discontinued Follow hypocalcemia outpatient (has pending PTH), discharged on calcium carbonate PO Pancytopenia, heme referral, follow outpatient Follow BP outpatient, adjust regimen as needed (home BP meds held at discharge)    Discharge Diagnoses:  Principal Problem:   Biliary colic Active Problems:   Hypotension   Abdominal pain   Discharge Condition: stable  Diet recommendation: full liquid diet  Filed Weights   07/08/22 1115  Weight: 49.9 kg    History of present illness:   Brenda [name] is Brenda [name] 82 y.o. female with past medical history significant for irritable bowel syndrome, chronic diarrhea, essential tremor, HTN, HLD, GERD, hiatal hernia s/p Nissen fundoplication 2017, anxiety/depression who presented to [facility] ED on 6/20 from [facility] nursing facility complaining of nausea, vomiting, abdominal pain ongoing and progressive over the last month.  Patient denies dysphagia, trouble with solids as she is able to initially tolerate but vomits thereafter.  She also reports subjective weight loss but denies hematemesis, no melena, no hematochezia.  Is followed by gastroenterology outpatient, and has been diagnosed with IBS, chronic diarrhea, and abnormal esophageal motility.  Patient currently uses budesonide, previously used cholestyramine but did tolerate the "powder".   In the ED, temperature 97.5 F, HR 82, RR 18, BP 92/46, SpO2 100% on room air.  WBC 5.3, hemoglobin 12.8, platelets 156.  Sodium 135, potassium 2.8, chloride 110, CO2 16,  glucose 112, BUN 60, creatinine 1.76.  AST 43, ALT 23, total bilirubin 0.7.  Calcium 7.3.  Albumin 3.1.  Urinalysis unrevealing.  Chest x-ray with no acute cardiopulmonary disease process, noted chronic hyperinflation related to COPD.  Lactic acid 1.0.  Patient was given potassium replacement and TRH consulted for admission for further evaluation and management.  She was admitted and had EGD which showed findings consistent with gastritis/duodenitis.  [name] recommended consulting surgery with concern that symptoms represented biliary colic.  Surgery recommended full liquid diet, outpatient [name] follow up, and follow up with Brenda [name] to discuss possible surgery for hiatal hernia surgery recurrence.  See below for additional details   Hospital Course:  Assessment and Plan:  Abdominal pain associate with nausea/vomiting  Hx hiatal hernia s/p Nissen fundoplication Patient presenting with progressive abdominal pain associate with nausea/vomiting, especially after eating meals.  Endorses associated subjective weight loss.  Has longstanding history of IBS, chronic diarrhea, history of hiatal hernia s/p Nissen fundoplication.  Follows with Brenda [name] outpatient.  EGD 2019 notable for abnormal esophageal motility, moderate hiatal hernia, gastritis.  [name] concerned about symptoms consistent with biliary colic.  Underwent EGD on 6/22 with findings of gastritis/duodenitis; Brenda [name] signed off with recommendation of surgical consultation.  Right upper quadrant ultrasound with cholelithiasis with at least 1 gallstone measuring 9 mm.  HIDA scan with patent cystic and common bile duct, no evidence of acute cholecystitis. -- General surgery following, appreciate assistance recommending outpatient follow up with [name] for cholelithiasis and with Brenda [name] from surgery for hiatal hernia -- Pathology from gastric biopsy: peptic duodenitis, reactive gastropathy with minimal chronic gastritis  -- PPI daily  -- Diet continued on  full liquid diet at discharge per general surgery recs  Acute renal failure: Resolved Metabolic acidosis:  Creatinine 1.76 on admission, likely secondary to prerenal azotemia in setting of dehydration and poor oral intake in days preceding hospitalization. -- Cr 1.76>1.23>1.07>0.91>0.80 -- Sodium bicarbonate 650 mg p.o. twice daily at discharge, follow outpatient   Hypokalemia Etiology likely secondary to [name] loss from nausea/vomiting, underlying chronic diarrhea.  Repleted. -- replaced, follow outpatient   Hypocalcemia 2/2 hypoalbuminemia Calcium 6.7, corrected for hypoalbuminemia of 2.2 = 8.1.  Magnesium improved today, vitamin D 25-hydroxy level within normal limits.  Received calcium gluconate 2 g IV x 1. -- Ionized calcium: 3.9 (low) -- Intact PTH: Pending -- will discharge with calcium supplementation, follow outpatient   Hypomagnesemia Improved, follow    Hypophosphatemia Improved, follow    Pancytopenia Unclear etiology, MCV 101.1.  WBC 3.4, hemoglobin 8.8, RBC 263, platelet count 91.  Query marrow suppression.  Anemia panel with iron 46, TIBC 109, ferritin 201, folate 11.4, vitamin B12 642. -- Discontinue Lovenox -- Continue to monitor CBC, will refer to hematology at discharge   Essential hypertension Patient with slight hypotension on admission, likely secondary to volume depletion.  IV fluids now discontinued -- Hold home antihypertensives at discharge as BP overall ok, follow outpatient    Hyperlipidemia -- Atorvastatin 20 mg p.o. daily   Essential tremor Follows with neurology outpatient, Brenda [name]. -- Continue primidone 100 mg p.o. 3 times daily     Procedures: EGD  - Normal esophagus.  - Z- line regular, 36 cm from the incisors.  - Medium- sized hiatal hernia. - Acute gastritis. Biopsied.  - Mucosal changes in the duodenum. Biopsied. - Clear liquid diet. - Observe patient' s clinical course. - Await pathology  results.  Consultations: [name] surgery  Discharge Exam: Vitals:   07/14/22 0418 07/14/22 1159  BP: 95/68 103/66  Pulse: 82 78  Resp: 16 16  Temp: 98.2 F (36.8 C) 98.1 F (36.7 C)  SpO2: 97% 98%   Eager to discharge Family at bedside  General: No acute distress. Cardiovascular: RRR Lungs: unlabored Abdomen: Soft, nontender, nondistended Neurological: Alert and oriented 3. Moves all extremities 4 with equal strength. Cranial nerves II through XII grossly intact. Extremities: No clubbing or cyanosis. No edema.   Discharge Instructions   Discharge Instructions     Ambulatory referral to Hematology / Oncology   Complete by: As directed    Call MD for:  difficulty breathing, headache or visual disturbances   Complete by: As directed    Call MD for:  extreme fatigue   Complete by: As directed    Call MD for:  hives   Complete by: As directed    Call MD for:  persistant dizziness or light-headedness   Complete by: As directed    Call MD for:  persistant nausea and vomiting   Complete by: As directed    Call MD for:  redness, tenderness, or signs of infection (pain, swelling, redness, odor or green/yellow discharge around incision site)   Complete by: As directed    Call MD for:  severe uncontrolled pain   Complete by: As directed    Call MD for:  temperature >100.4   Complete by: As directed    Diet - low sodium heart healthy   Complete by: As directed    Discharge instructions   Complete by: As directed    You were seen for nausea, vomiting, and abdominal pain.  You've had an extensive workup with and EGD showing gastritis (inflammation of the stomach) and duodenitis (inflammation of  the small intestines).  Follow your final biopsy results up with [name].  Your biopsies show peptic duodenitis and minimal chronic gastritis.  You had Crystalee Ventress HIDA scan that did not show evidence of gallbladder inflammation.  You were seen by surgery who recommended follow up outpatient for your  gallstones.  Continue Tenesha Garza full liquid diet at discharge.  Follow up with Brenda [name] with surgery as an outpatient.  You blood pressure is ok while you're not on your home lasix or lisinopril.  Continue to hold this until you follow up with your PCP as an outpatient.   Your white blood cells, red blood cells, and platelets are low.  I'll refer you to hematology for further workup of your "pancytopenia".   Your calcium level is low.  I'll start you on calcium by mouth at discharge.  Repeat labs with your PCP within 1 week to determine if the dose needs to be adjusted.  You have Raynald Rouillard pending PTH lab that should be reviewed with your PCP outpatient.   Return for new, recurrent, or worsening symptoms.  Please ask your PCP to request records from this hospitalization so they know what was done and what the next steps will be.   Increase activity slowly   Complete by: As directed       Allergies as of 07/14/2022       Reactions   Macrodantin [nitrofurantoin Macrocrystal] Shortness Of Breath, Itching, Nausea And Vomiting, Swelling, Rash   Tongue swelling   Topamax [topiramate] Other (See Comments)   Loss of taste, fatigue!!!!!!   Diclofenac Other (See Comments)   Stomach burning   Tramadol Other (See Comments)   Stomach burning   Rice Nausea And Vomiting   Rosemary Oil Nausea And Vomiting   Codeine Nausea And Vomiting        Medication List     STOP taking these medications    furosemide 20 MG tablet Commonly known as: LASIX   lisinopril 10 MG tablet Commonly known as: ZESTRIL       TAKE these medications    (feeding supplement) PROSource Plus liquid Take 30 mLs by mouth 3 (three) times daily between meals.   ALPRAZolam 0.25 MG tablet Commonly known as: XANAX Take 1 tablet (0.25 mg total) by mouth daily as needed (tremors).   atorvastatin 20 MG tablet Commonly known as: LIPITOR Take 20 mg by mouth daily.   budesonide 3 MG 24 hr capsule Commonly known as: ENTOCORT  EC Take 9 mg by mouth in the morning.   calcium carbonate 500 MG chewable tablet Commonly known as: Tums Chew 2 tablets (400 mg of elemental calcium total) by mouth daily for 14 days. Follow repeat labs with your PCP within 1 week to determine if this dose needs to be adjusted   DULoxetine 20 MG capsule Commonly known as: CYMBALTA Take 20 mg by mouth daily.   loperamide 2 MG tablet Commonly known as: IMODIUM Judi Jaffe-D Take 4-6 mg by mouth See admin instructions. Take 6 mg by mouth in the morning and an additional 4 mg with each subsequent loose stool   pantoprazole 40 MG tablet Commonly known as: PROTONIX Take 1 tablet (40 mg total) by mouth daily. Follow up with gastroenterology Start taking on: July 15, 2022   PreserVision AREDS 2 Caps Take 1 capsule by mouth daily.   PREVAGEN PO Take 1 capsule by mouth daily.   primidone 50 MG tablet Commonly known as: MYSOLINE Take 2 tablets (100 mg total) by mouth 3 (  three) times daily. Must keep follow up 07/07/2020 for ongoing refills What changed:  when to take this additional instructions   promethazine 25 MG tablet Commonly known as: PHENERGAN Take 25 mg by mouth 5 (five) times daily as needed for nausea or vomiting.   sodium bicarbonate 650 MG tablet Take 1 tablet (650 mg total) by mouth 2 (two) times daily for 14 days. Please check labs (BMP) within 1 week, follow with your outpatient doctor to determine how long you'll need to continue this medicine.   Vitamin D (Ergocalciferol) 1.25 MG (50000 UNIT) Caps capsule Commonly known as: DRISDOL Take 50,000 Units by mouth every Monday.       Allergies  Allergen Reactions   Macrodantin [Nitrofurantoin Macrocrystal] Shortness Of Breath, Itching, Nausea And Vomiting, Swelling and Rash    Tongue swelling   Topamax [Topiramate] Other (See Comments)    Loss of taste, fatigue!!!!!!   Diclofenac Other (See Comments)    Stomach burning   Tramadol Other (See Comments)    Stomach burning    Rice Nausea And Vomiting   Rosemary Oil Nausea And Vomiting   Codeine Nausea And Vomiting    Follow-up Information     [name], MD. Go on 08/05/2022.   Specialty: General Surgery Why: at 1:50 PM to discuss surgical repair of hiatal hernia, please arrive by 1:30 PM to get checked in and fill out any necessary paperwork. Contact information: [address] [place] [address] [phone]         Brenda [name], Brenda Saucer, MD Follow up.   Specialty: Family Medicine Contact information: [address] [place] [address] [phone]         Vida Rigger, MD Follow up.   Specialty: Gastroenterology Contact information: [address] [place] [address] [phone]                  The results of significant diagnostics from this hospitalization (including imaging, microbiology, ancillary and laboratory) are listed below for reference.    Significant Diagnostic Studies: NM Hepatobiliary Liver Func  Result Date: 07/12/2022 CLINICAL DATA:  Cholelithiasis.  Evaluate for cholecystitis. EXAM: NUCLEAR MEDICINE HEPATOBILIARY IMAGING TECHNIQUE: Sequential images of the abdomen were obtained out to 60 minutes following intravenous administration of radiopharmaceutical. RADIOPHARMACEUTICALS:  5.1 mCi Tc-57m  Choletec IV COMPARISON:  None Available. FINDINGS: Uniform uptake of radiotracer in the liver. Counts are evident in the small bowel by 15 minutes. At 60 minutes, IV morphine was administered to augment filling of the gallbladder. Gallbladder fills following morphine augmentation. IMPRESSION: 1. Patent cystic duct and common bile duct. 2. No evidence of acute cholecystitis. Electronically Signed   By: Genevive Bi M.D.   On: 07/12/2022 15:38   US ABDOMEN LIMITED RUQ (LIVER/GB)  Result Date: 07/11/2022 CLINICAL DATA:  Biliary colic EXAM: ULTRASOUND ABDOMEN LIMITED RIGHT UPPER QUADRANT COMPARISON:  Abdominal CT from 3  days ago FINDINGS: Gallbladder: Thickened gallbladder wall but under distended. At least 1 gallstone measuring 9 mm. No focal tenderness or pericholecystic edema Common bile duct: Diameter: 3 mm.  Where visualized, no filling defect. Liver: No focal lesion identified. Within normal limits in parenchymal echogenicity. Portal vein is patent on color Doppler imaging with normal direction of blood flow towards the liver. IMPRESSION: 1. Cholelithiasis. 2. Suspicion for nonobstructing distal CBD stone by CT but not detected on this exam. Electronically Signed   By: Tiburcio Pea M.D.   On: 07/11/2022 07:35   CT ABDOMEN  PELVIS WO CONTRAST  Result Date: 07/08/2022 CLINICAL DATA:  Nonlocalized abdominal pain EXAM: CT ABDOMEN AND PELVIS WITHOUT CONTRAST TECHNIQUE: Multidetector CT imaging of the abdomen and pelvis was performed following the standard protocol without IV contrast. RADIATION DOSE REDUCTION: This exam was performed according to the departmental dose-optimization program which includes automated exposure control, adjustment of the mA and/or kV according to patient size and/or use of iterative reconstruction technique. COMPARISON:  Noncontrast CT 08/13/2019 and older. FINDINGS: Lower chest: There is some linear opacity lung bases likely scar or atelectasis. No pleural effusion. Some breathing motion. Moderate hiatal hernia. Coronary artery calcifications are seen. Hepatobiliary: Stones in the nondilated gallbladder. There are some tiny low-attenuation lesion seen in the liver which are too small to completely characterize although possibly benign cysts. No specific imaging follow-up. These were seen on the prior in retrospect. Pancreas: Moderate global atrophy of the pancreas. No pancreatic mass. Spleen: Normal in size without focal abnormality. Adrenals/Urinary Tract: The adrenal glands are preserved. No obstructing renal stone or collecting system dilatation. The ureters have normal course and caliber  extending down to the bladder. Preserved contour to the urinary bladder. Prominent bilateral renal sinus fat. Preserved adrenal glands. Stomach/Bowel: Stomach is within normal limits. Appendix appears normal. No evidence of bowel wall thickening, distention, or inflammatory changes. Vascular/Lymphatic: Aortic atherosclerosis. No enlarged abdominal or pelvic lymph nodes. Reproductive: Uterus and bilateral adnexa are unremarkable. Other: No free air or free fluid. Musculoskeletal: Curvature of the spine with degenerative changes. Augmentation cement once again seen at L2. Degenerative changes of the pelvis. IMPRESSION: No bowel obstruction, free air or free fluid. Gallstones. No obstructing renal stone Moderate hiatal hernia. Electronically Signed   By: Karen Kays M.D.   On: 07/08/2022 15:21   DG Chest 2 View  Result Date: 07/08/2022 CLINICAL DATA:  Sepsis. EXAM: CHEST - 2 VIEW COMPARISON:  Chest x-ray dated August 15, 2019. FINDINGS: The heart size and mediastinal contours are within normal limits. Normal pulmonary vascularity. Chronic hyperinflation. No focal consolidation, pleural effusion, or pneumothorax. No acute osseous abnormality. IMPRESSION: 1. No acute cardiopulmonary disease. COPD. Electronically Signed   By: Obie Dredge M.D.   On: 07/08/2022 13:45    Microbiology: Recent Results (from the past 240 hour(s))  Blood Culture (routine x 2)     Status: None   Collection Time: 07/08/22 12:06 PM   Specimen: BLOOD LEFT ARM  Result Value Ref Range Status   Specimen Description   Final    BLOOD LEFT ARM Performed at [facility], [address] [place], [place] [address]    Special Requests   Final    BOTTLES DRAWN AEROBIC AND ANAEROBIC Blood Culture adequate volume Performed at [facility], [address], [place], [place] [address]    Culture   Final    NO GROWTH 5 DAYS Performed at [facility], [address] [place], [place] [address]     Report Status 07/13/2022 FINAL  Final  Blood Culture (routine x 2)     Status: None   Collection Time: 07/08/22 12:13 PM   Specimen: BLOOD LEFT FOREARM  Result Value Ref Range Status   Specimen Description   Final    BLOOD LEFT FOREARM Performed at [facility], [address], [place], [place] [address]    Special Requests   Final    BOTTLES DRAWN AEROBIC AND ANAEROBIC Blood Culture adequate volume Performed at [facility], [address] [place],  [place] [address]    Culture   Final    NO GROWTH 5 DAYS Performed at [facility], [address], [place], [place] [address]    Report Status 07/13/2022 FINAL  Final     Labs: Basic Metabolic Panel: Recent Labs  Lab 07/10/22 0459 07/11/22 0357 07/12/22 0358 07/13/22 0414 07/14/22 0426  NA 135 138 138 138 137  K 3.7 4.5 3.9 3.5 3.2*  CL 115* 115* 113* 113* 109  CO2 15* 19* 18* 17* 22  GLUCOSE 92 96 107* 84 98  BUN 13 6* <5* <5* <5*  CREATININE 0.91 0.80 0.70 0.76 0.75  CALCIUM 6.1* 6.6* 7.0* 6.7* 6.7*  MG 1.9 1.4* 1.9 1.4* 2.0  PHOS 2.1* 2.4* 3.5 1.7* 2.8   Liver Function Tests: Recent Labs  Lab 07/09/22 0434 07/11/22 0357 07/12/22 0358 07/13/22 0414 07/14/22 0426  AST 35 33 27 23 24   ALT 18 20 18 16 16   ALKPHOS 62 55 57 60 66  BILITOT 0.7 0.5 0.5 0.7 0.3  PROT 4.6* 4.5* 4.5* 4.5* 4.8*  ALBUMIN 2.3* 2.1* 2.2* 2.1* 2.2*   Recent Labs  Lab 07/08/22 1312  LIPASE 54*   No results for input(s): "AMMONIA" in the last 168 hours. CBC: Recent Labs  Lab 07/08/22 1204 07/08/22 2059 07/09/22 0434 07/11/22 0357 07/12/22 0358 07/13/22 0414 07/14/22 0426  WBC 5.3   < > 3.1* 3.4* 3.0* 2.9* 3.0*  NEUTROABS 2.7  --   --   --   --   --   --   HGB 12.8   < > 9.6* 8.8* 8.8* 8.9* 9.3*  HCT 38.1   < > 28.4* 26.6* 26.7* 27.1* 28.3*  MCV 98.4   < > 100.0 101.1* 101.5* 103.0* 100.0  PLT 156   < > 108* 91* 94* 93* 107*   < > = values in this interval not displayed.   Cardiac  Enzymes: No results for input(s): "CKTOTAL", "CKMB", "CKMBINDEX", "TROPONINI" in the last 168 hours. BNP: BNP (last 3 results) No results for input(s): "BNP" in the last 8760 hours.  ProBNP (last 3 results) No results for input(s): "PROBNP" in the last 8760 hours.  CBG: No results for input(s): "GLUCAP" in the last 168 hours.     Signed:  Lacretia Nicks MD.  [facility] 07/14/2022, 1:38 PM

## 2022-07-16 ENCOUNTER — Other Ambulatory Visit: Payer: Self-pay | Admitting: Internal Medicine

## 2022-07-16 DIAGNOSIS — D61818 Other pancytopenia: Secondary | ICD-10-CM

## 2022-07-17 ENCOUNTER — Inpatient Hospital Stay: Payer: Medicare PPO

## 2022-07-17 ENCOUNTER — Inpatient Hospital Stay: Payer: Medicare PPO | Attending: Internal Medicine | Admitting: Internal Medicine

## 2022-07-17 VITALS — BP 144/66 | HR 116 | Temp 97.7°F | Resp 18 | Wt 102.0 lb

## 2022-07-17 DIAGNOSIS — J449 Chronic obstructive pulmonary disease, unspecified: Secondary | ICD-10-CM | POA: Insufficient documentation

## 2022-07-17 DIAGNOSIS — D649 Anemia, unspecified: Secondary | ICD-10-CM | POA: Insufficient documentation

## 2022-07-17 DIAGNOSIS — F419 Anxiety disorder, unspecified: Secondary | ICD-10-CM | POA: Insufficient documentation

## 2022-07-17 DIAGNOSIS — K529 Noninfective gastroenteritis and colitis, unspecified: Secondary | ICD-10-CM | POA: Insufficient documentation

## 2022-07-17 DIAGNOSIS — R42 Dizziness and giddiness: Secondary | ICD-10-CM | POA: Insufficient documentation

## 2022-07-17 DIAGNOSIS — Z79899 Other long term (current) drug therapy: Secondary | ICD-10-CM | POA: Insufficient documentation

## 2022-07-17 DIAGNOSIS — Z87891 Personal history of nicotine dependence: Secondary | ICD-10-CM | POA: Insufficient documentation

## 2022-07-17 DIAGNOSIS — G250 Essential tremor: Secondary | ICD-10-CM | POA: Insufficient documentation

## 2022-07-17 DIAGNOSIS — N179 Acute kidney failure, unspecified: Secondary | ICD-10-CM | POA: Diagnosis not present

## 2022-07-17 DIAGNOSIS — I10 Essential (primary) hypertension: Secondary | ICD-10-CM | POA: Diagnosis not present

## 2022-07-17 DIAGNOSIS — D61818 Other pancytopenia: Secondary | ICD-10-CM

## 2022-07-17 DIAGNOSIS — Z7952 Long term (current) use of systemic steroids: Secondary | ICD-10-CM | POA: Diagnosis not present

## 2022-07-17 DIAGNOSIS — Z801 Family history of malignant neoplasm of trachea, bronchus and lung: Secondary | ICD-10-CM | POA: Diagnosis not present

## 2022-07-17 DIAGNOSIS — E46 Unspecified protein-calorie malnutrition: Secondary | ICD-10-CM

## 2022-07-17 DIAGNOSIS — R109 Unspecified abdominal pain: Secondary | ICD-10-CM | POA: Diagnosis not present

## 2022-07-17 LAB — CMP (CANCER CENTER ONLY)
ALT: 17 U/L (ref 0–44)
AST: 28 U/L (ref 15–41)
Albumin: 3.3 g/dL — ABNORMAL LOW (ref 3.5–5.0)
Alkaline Phosphatase: 87 U/L (ref 38–126)
Anion gap: 12 (ref 5–15)
BUN: 10 mg/dL (ref 8–23)
CO2: 25 mmol/L (ref 22–32)
Calcium: 7.4 mg/dL — ABNORMAL LOW (ref 8.9–10.3)
Chloride: 103 mmol/L (ref 98–111)
Creatinine: 0.9 mg/dL (ref 0.44–1.00)
GFR, Estimated: >60 mL/min (ref >60)
Glucose, Bld: 109 mg/dL — ABNORMAL HIGH (ref 70–99)
Potassium: 3.2 mmol/L — ABNORMAL LOW (ref 3.5–5.1)
Sodium: 140 mmol/L (ref 135–145)
Total Bilirubin: 0.3 mg/dL (ref 0.3–1.2)
Total Protein: 6.9 g/dL (ref 6.5–8.1)

## 2022-07-17 LAB — CBC WITH DIFFERENTIAL (CANCER CENTER ONLY)
Abs Immature Granulocytes: 0.02 10*3/uL (ref 0.00–0.07)
Basophils Absolute: 0 10*3/uL (ref 0.0–0.1)
Basophils Relative: 1 %
Eosinophils Absolute: 0.1 10*3/uL (ref 0.0–0.5)
Eosinophils Relative: 2 %
HCT: 35 % — ABNORMAL LOW (ref 36.0–46.0)
Hemoglobin: 12 g/dL (ref 12.0–15.0)
Immature Granulocytes: 1 %
Lymphocytes Relative: 36 %
Lymphs Abs: 1.5 10*3/uL (ref 0.7–4.0)
MCH: 33.4 pg (ref 26.0–34.0)
MCHC: 34.3 g/dL (ref 30.0–36.0)
MCV: 97.5 fL (ref 80.0–100.0)
Monocytes Absolute: 0.5 10*3/uL (ref 0.1–1.0)
Monocytes Relative: 12 %
Neutro Abs: 2.1 10*3/uL (ref 1.7–7.7)
Neutrophils Relative %: 48 %
Platelet Count: 236 10*3/uL (ref 150–400)
RBC: 3.59 MIL/uL — ABNORMAL LOW (ref 3.87–5.11)
RDW: 13.2 % (ref 11.5–15.5)
WBC Count: 4.3 10*3/uL (ref 4.0–10.5)
nRBC: 0 % (ref 0.0–0.2)

## 2022-07-17 LAB — LACTATE DEHYDROGENASE: LDH: 179 U/L (ref 98–192)

## 2022-07-17 LAB — IRON AND IRON BINDING CAPACITY (CC-WL,HP ONLY)
Iron: 63 ug/dL (ref 28–170)
Saturation Ratios: 38 % — ABNORMAL HIGH (ref 10.4–31.8)
TIBC: 167 ug/dL — ABNORMAL LOW (ref 250–450)
UIBC: 104 ug/dL — ABNORMAL LOW (ref 148–442)

## 2022-07-17 LAB — TSH: TSH: 3.007 u[IU]/mL (ref 0.350–4.500)

## 2022-07-17 LAB — HEPATITIS PANEL, ACUTE
HCV Ab: NONREACTIVE
Hep A IgM: NONREACTIVE
Hep B C IgM: NONREACTIVE
Hepatitis B Surface Ag: NONREACTIVE

## 2022-07-17 LAB — FOLATE: Folate: 13.4 ng/mL (ref >5.9)

## 2022-07-17 LAB — PTH, INTACT AND CALCIUM
Calcium, Total (PTH): 6.5 mg/dL — CL (ref 8.7–10.3)
PTH: 79 pg/mL — ABNORMAL HIGH (ref 15–65)

## 2022-07-17 LAB — VITAMIN B12: Vitamin B-12: 1127 pg/mL — ABNORMAL HIGH (ref 180–914)

## 2022-07-17 LAB — FERRITIN: Ferritin: 204 ng/mL (ref 11–307)

## 2022-07-17 NOTE — Progress Notes (Signed)
Brenda [name] [phone]   [phone]  CONSULT NOTE  REFERRING PHYSICIAN: Dr. Lupe [name]  REASON FOR CONSULTATION:  82 years old white female with significant anemia.  HPI Brenda [name] is a 82 y.o. female who has past medical history significant for anxiety, chronic diarrhea, colon polyps, hiatal hernia, history of skin cancer as well as hypertension and dyslipidemia osteoporosis as well as essential tremor.  The patient is [facility] a [name].  The patient mentioned that a month ago she presented to the emergency department on 07/08/2022 complaining of nausea, vomiting, abdominal pain and generalized weakness.  She had normal CBC on admission except for a slightly decreased hemoglobin of 12.8.  She had EGD that showed evidence for gastritis/duodenitis.  She had hiatal hernia and surgery recommended for her full liquid diet.  She had an outpatient visit with Dr. Derrell Lolling for discussion of hiatal hernia surgery.  She is currently on PPI and she discontinued many of her home medications including diuretics and now she has swelling of her lower extremities.  During her admission she developed significant pancytopenia.   He was referred to me today for evaluation and recommendation regarding her condition. Seen today she continues to have mild nausea but now able to eat more than before.  She continues to have fatigue with occasional dizzy spells but no significant chest pain, shortness of breath, cough or hemoptysis.  She lost several pounds during her hospitalization.  She has no fever or chills. Family history significant for mother with lung cancer, father had COPD Sister had COPD and another sister had MS. The patient is married and has no children.  She used to work as a Human resources officer.  She was accompanied today by her husband Brenda [name] and her nephew Brenda [name].  She has a history for smoking but quit 30 years ago.  She  drinks alcohol occasionally and no history of drug abuse.  HPI  Past Medical History:  Diagnosis Date   Anxiety    Chronic diarrhea    Coarse tremors    Colon polyp    DDD (degenerative disc disease), cervical    Hiatal hernia    History of skin cancer    Hypercholesteremia    Hypertension    Migraines    Osteoporosis    Shortness of breath dyspnea    with exertion   Tremor    Weight loss     Past Surgical History:  Procedure Laterality Date   BIOPSY  07/10/2022   Procedure: BIOPSY;  Surgeon: Charlott Rakes, MD;  Location: [facility];  Service: Gastroenterology;;   ESOPHAGEAL MANOMETRY N/A 01/15/2015   Procedure: ESOPHAGEAL MANOMETRY (EM);  Surgeon: Graylin Shiver, MD;  Location: [facility];  Service: Endoscopy;  Laterality: N/A;   ESOPHAGOGASTRODUODENOSCOPY (EGD) WITH PROPOFOL Left 07/10/2022   Procedure: ESOPHAGOGASTRODUODENOSCOPY (EGD) WITH PROPOFOL;  Surgeon: Charlott Rakes, MD;  Location: [facility];  Service: Gastroenterology;  Laterality: Left;   HIATAL HERNIA REPAIR     KYPHOPLASTY N/A 12/22/2017   Procedure: LUMBAR TWO KYPHOPLASTY;  Surgeon: Estill Bamberg, MD;  Location: [facility];  Service: Orthopedics;  Laterality: N/A;  LUMBAR TWO KYPHOPLASTY   RHINOPLASTY     SINUS SURGERY WITH INSTATRAK      Family History  Problem Relation Age of Onset   Lung cancer Mother    COPD Father    Tremor Father    Hypertension Brother    Tremor Brother  Social History Social History   Tobacco Use   Smoking status: Former    Types: Cigarettes    Quit date: 09/11/1988    Years since quitting: 33.8   Smokeless tobacco: Never  Vaping Use   Vaping Use: Never used  Substance Use Topics   Alcohol use: Yes    Comment: 2 drinks at night   Drug use: No    Allergies  Allergen Reactions   Macrodantin [Nitrofurantoin Macrocrystal] Shortness Of Breath, Itching, Nausea And Vomiting, Swelling and Rash    Tongue swelling   Topamax [Topiramate] Other (See Comments)     Loss of taste, fatigue!!!!!!   Diclofenac Other (See Comments)    Stomach burning   Tramadol Other (See Comments)    Stomach burning   Rice Nausea And Vomiting   Rosemary Oil Nausea And Vomiting   Codeine Nausea And Vomiting    Current Outpatient Medications  Medication Sig Dispense Refill   ALPRAZolam (XANAX) 0.25 MG tablet Take 1 tablet (0.25 mg total) by mouth daily as needed (tremors). 5 tablet 0   Apoaequorin (PREVAGEN PO) Take 1 capsule by mouth daily.     atorvastatin (LIPITOR) 20 MG tablet Take 20 mg by mouth daily.     budesonide (ENTOCORT EC) 3 MG 24 hr capsule Take 9 mg by mouth in the morning.     calcium carbonate (TUMS) 500 MG chewable tablet Chew 2 tablets (400 mg of elemental calcium total) by mouth daily for 14 days. Follow repeat labs with your PCP within 1 week to determine if this dose needs to be adjusted 28 tablet 0   DULoxetine (CYMBALTA) 20 MG capsule Take 20 mg by mouth daily.     loperamide (IMODIUM A-D) 2 MG tablet Take 4-6 mg by mouth See admin instructions. Take 6 mg by mouth in the morning and an additional 4 mg with each subsequent loose stool     Multiple Vitamins-Minerals (PRESERVISION AREDS 2) CAPS Take 1 capsule by mouth daily.     Nutritional Supplements (,FEEDING SUPPLEMENT, PROSOURCE PLUS) liquid Take 30 mLs by mouth 3 (three) times daily between meals. (Patient not taking: Reported on 07/08/2022)     pantoprazole (PROTONIX) 40 MG tablet Take 1 tablet (40 mg total) by mouth daily. Follow up with gastroenterology 30 tablet 1   primidone (MYSOLINE) 50 MG tablet Take 2 tablets (100 mg total) by mouth 3 (three) times daily. Must keep follow up 07/07/2020 for ongoing refills (Patient taking differently: Take 100 mg by mouth in the morning, at noon, and at bedtime.) 540 tablet 0   promethazine (PHENERGAN) 25 MG tablet Take 25 mg by mouth 5 (five) times daily as needed for nausea or vomiting.      sodium bicarbonate 650 MG tablet Take 1 tablet (650 mg total) by  mouth 2 (two) times daily for 14 days. Please check labs (BMP) within 1 week, follow with your outpatient doctor to determine how long you'll need to continue this medicine. 28 tablet 0   Vitamin D, Ergocalciferol, (DRISDOL) 1.25 MG (50000 UNIT) CAPS capsule Take 50,000 Units by mouth every Monday.     No current facility-administered medications for this visit.    Review of Systems  Constitutional: positive for anorexia, fatigue, and weight loss Eyes: negative Ears, nose, mouth, throat, and face: negative Respiratory: negative Cardiovascular: negative Gastrointestinal: positive for nausea Genitourinary:negative Integument/breast: negative Hematologic/lymphatic: negative Musculoskeletal:negative Neurological: negative Behavioral/Psych: negative Endocrine: negative Allergic/Immunologic: negative  Physical Exam  NUU:VOZDG, healthy, no distress, well nourished, and  well developed SKIN: skin color, texture, turgor are normal, no rashes or significant lesions HEAD: Normocephalic, No masses, lesions, tenderness or abnormalities EYES: normal, PERRLA, Conjunctiva are pink and non-injected EARS: External ears normal, Canals clear OROPHARYNX:no exudate, no erythema, and lips, buccal mucosa, and tongue normal  NECK: supple, no adenopathy, no JVD LYMPH:  no palpable lymphadenopathy, no hepatosplenomegaly BREAST:not examined LUNGS: clear to auscultation , and palpation HEART: regular rate & rhythm, no murmurs, and no gallops ABDOMEN:abdomen soft, non-tender, normal bowel sounds, and no masses or organomegaly BACK: Back symmetric, no curvature., No CVA tenderness EXTREMITIES:no joint deformities, effusion, or inflammation, 1+ edema   NEURO: alert & oriented x 3 with fluent speech, no focal motor/sensory deficits  PERFORMANCE STATUS: ECOG 1-2  LABORATORY DATA: Lab Results  Component Value Date   WBC 4.3 07/17/2022   HGB 12.0 07/17/2022   HCT 35.0 (L) 07/17/2022   MCV 97.5 07/17/2022    PLT 236 07/17/2022      Chemistry      Component Value Date/Time   NA 137 07/14/2022 0426   K 3.2 (L) 07/14/2022 0426   CL 109 07/14/2022 0426   CO2 22 07/14/2022 0426   BUN <5 (L) 07/14/2022 0426   CREATININE 0.75 07/14/2022 0426      Component Value Date/Time   CALCIUM 6.7 (L) 07/14/2022 0426   CALCIUM 6.5 (LL) 07/09/2022 0948   ALKPHOS 66 07/14/2022 0426   AST 24 07/14/2022 0426   ALT 16 07/14/2022 0426   BILITOT 0.3 07/14/2022 0426       RADIOGRAPHIC STUDIES: NM Hepatobiliary Liver Func  Result Date: 07/12/2022 CLINICAL DATA:  Cholelithiasis.  Evaluate for cholecystitis. EXAM: NUCLEAR MEDICINE HEPATOBILIARY IMAGING TECHNIQUE: Sequential images of the abdomen were obtained out to 60 minutes following intravenous administration of radiopharmaceutical. RADIOPHARMACEUTICALS:  5.1 mCi Tc-57m  Choletec IV COMPARISON:  None Available. FINDINGS: Uniform uptake of radiotracer in the liver. Counts are evident in the small bowel by 15 minutes. At 60 minutes, IV morphine was administered to augment filling of the gallbladder. Gallbladder fills following morphine augmentation. IMPRESSION: 1. Patent cystic duct and common bile duct. 2. No evidence of acute cholecystitis. Electronically Signed   By: Genevive Bi M.D.   On: 07/12/2022 15:38   US ABDOMEN LIMITED RUQ (LIVER/GB)  Result Date: 07/11/2022 CLINICAL DATA:  Biliary colic EXAM: ULTRASOUND ABDOMEN LIMITED RIGHT UPPER QUADRANT COMPARISON:  Abdominal CT from 3 days ago FINDINGS: Gallbladder: Thickened gallbladder wall but under distended. At least 1 gallstone measuring 9 mm. No focal tenderness or pericholecystic edema Common bile duct: Diameter: 3 mm.  Where visualized, no filling defect. Liver: No focal lesion identified. Within normal limits in parenchymal echogenicity. Portal vein is patent on color Doppler imaging with normal direction of blood flow towards the liver. IMPRESSION: 1. Cholelithiasis. 2. Suspicion for nonobstructing  distal CBD stone by CT but not detected on this exam. Electronically Signed   By: Tiburcio Pea M.D.   On: 07/11/2022 07:35   CT ABDOMEN PELVIS WO CONTRAST  Result Date: 07/08/2022 CLINICAL DATA:  Nonlocalized abdominal pain EXAM: CT ABDOMEN AND PELVIS WITHOUT CONTRAST TECHNIQUE: Multidetector CT imaging of the abdomen and pelvis was performed following the standard protocol without IV contrast. RADIATION DOSE REDUCTION: This exam was performed according to the departmental dose-optimization program which includes automated exposure control, adjustment of the mA and/or kV according to patient size and/or use of iterative reconstruction technique. COMPARISON:  Noncontrast CT 08/13/2019 and older. FINDINGS: Lower chest: There is some linear opacity  lung bases likely scar or atelectasis. No pleural effusion. Some breathing motion. Moderate hiatal hernia. Coronary artery calcifications are seen. Hepatobiliary: Stones in the nondilated gallbladder. There are some tiny low-attenuation lesion seen in the liver which are too small to completely characterize although possibly benign cysts. No specific imaging follow-up. These were seen on the prior in retrospect. Pancreas: Moderate global atrophy of the pancreas. No pancreatic mass. Spleen: Normal in size without focal abnormality. Adrenals/Urinary Tract: The adrenal glands are preserved. No obstructing renal stone or collecting system dilatation. The ureters have normal course and caliber extending down to the bladder. Preserved contour to the urinary bladder. Prominent bilateral renal sinus fat. Preserved adrenal glands. Stomach/Bowel: Stomach is within normal limits. Appendix appears normal. No evidence of bowel wall thickening, distention, or inflammatory changes. Vascular/Lymphatic: Aortic atherosclerosis. No enlarged abdominal or pelvic lymph nodes. Reproductive: Uterus and bilateral adnexa are unremarkable. Other: No free air or free fluid. Musculoskeletal:  Curvature of the spine with degenerative changes. Augmentation cement once again seen at L2. Degenerative changes of the pelvis. IMPRESSION: No bowel obstruction, free air or free fluid. Gallstones. No obstructing renal stone Moderate hiatal hernia. Electronically Signed   By: Karen Kays M.D.   On: 07/08/2022 15:21   DG Chest 2 View  Result Date: 07/08/2022 CLINICAL DATA:  Sepsis. EXAM: CHEST - 2 VIEW COMPARISON:  Chest x-ray dated August 15, 2019. FINDINGS: The heart size and mediastinal contours are within normal limits. Normal pulmonary vascularity. Chronic hyperinflation. No focal consolidation, pleural effusion, or pneumothorax. No acute osseous abnormality. IMPRESSION: 1. No acute cardiopulmonary disease. COPD. Electronically Signed   By: Obie Dredge M.D.   On: 07/08/2022 13:45    ASSESSMENT: This is a very pleasant 82 years old white female presented for evaluation of pancytopenia during her hospitalization.  The patient had normal total white blood count as well as platelet count before her admission but had only mild anemia with hemoglobin of 12.8 on the day of admission. Her pancytopenia could be secondary to her malnutrition and being on liquid diet for almost 10 days in addition to the other comorbidities including acute renal failure.   PLAN: I had a lengthy discussion with the patient and her family today about her current condition and treatment options.  I order several studies today including repeat CBC that showed normal total white blood count of 4.3 with normal hemoglobin of 12.0 and hematocrit 35.0% and normal platelets count of 236,000.  Her iron study was normal today with serum iron of 63 and iron saturation 38% and ferritin level of 204.  The patient had elevated vitamin B12 and normal serum folate and TSH. Other lab results including ANA, rheumatoid factor, HIV, and hepatitis panel I recommended for the patient to continue with the multivitamin supplements and follow-up with  her primary care physician.  I do not see any concerning abnormalities at this point. I will be happy to see her in the future if there is any concerning hematological disorder. The patient and her family are in agreement with the current plan. She was advised to call immediately if she has any other concerning symptoms in the interval. The patient voices understanding of current disease status and treatment options and is in agreement with the current care plan.  All questions were answered. The patient knows to call the clinic with any problems, questions or concerns. We can certainly see the patient much sooner if necessary.  Thank you so much for allowing me to participate in  the care of Brenda [name]. I will continue to follow up the patient with you and assist in her care.  The total time spent in the appointment was 60 minutes.  Disclaimer: This note was dictated with voice recognition software. Similar sounding words can inadvertently be transcribed and may not be corrected upon review.   Lajuana Matte July 17, 2022, 11:11 AM

## 2022-07-18 LAB — HIV ANTIBODY (ROUTINE TESTING W REFLEX): HIV Screen 4th Generation wRfx: NONREACTIVE

## 2022-07-18 LAB — PTH, INTACT AND CALCIUM

## 2022-07-20 LAB — PROTEIN ELECTROPHORESIS, SERUM, WITH REFLEX
A/G Ratio: 1 (ref 0.7–1.7)
Albumin ELP: 3.3 g/dL (ref 2.9–4.4)
Alpha-1-Globulin: 0.3 g/dL (ref 0.0–0.4)
Alpha-2-Globulin: 0.8 g/dL (ref 0.4–1.0)
Beta Globulin: 0.9 g/dL (ref 0.7–1.3)
Gamma Globulin: 1.3 g/dL (ref 0.4–1.8)
Globulin, Total: 3.3 g/dL (ref 2.2–3.9)
Total Protein ELP: 6.6 g/dL (ref 6.0–8.5)

## 2022-07-20 LAB — ANTINUCLEAR ANTIBODIES, IFA: ANA Ab, IFA: NEGATIVE

## 2022-07-20 LAB — RHEUMATOID FACTOR: Rheumatoid fact SerPl-aCnc: 12.9 IU/mL (ref <14.0)

## 2022-07-23 DIAGNOSIS — F419 Anxiety disorder, unspecified: Secondary | ICD-10-CM | POA: Diagnosis not present

## 2022-07-23 DIAGNOSIS — F5101 Primary insomnia: Secondary | ICD-10-CM | POA: Diagnosis not present

## 2022-07-23 DIAGNOSIS — E7800 Pure hypercholesterolemia, unspecified: Secondary | ICD-10-CM | POA: Diagnosis not present

## 2022-07-23 DIAGNOSIS — R609 Edema, unspecified: Secondary | ICD-10-CM | POA: Diagnosis not present

## 2022-07-23 DIAGNOSIS — I10 Essential (primary) hypertension: Secondary | ICD-10-CM | POA: Diagnosis not present

## 2022-07-23 DIAGNOSIS — K219 Gastro-esophageal reflux disease without esophagitis: Secondary | ICD-10-CM | POA: Diagnosis not present

## 2022-07-23 DIAGNOSIS — R197 Diarrhea, unspecified: Secondary | ICD-10-CM | POA: Diagnosis not present

## 2022-07-23 DIAGNOSIS — R251 Tremor, unspecified: Secondary | ICD-10-CM | POA: Diagnosis not present

## 2022-08-05 DIAGNOSIS — K449 Diaphragmatic hernia without obstruction or gangrene: Secondary | ICD-10-CM | POA: Diagnosis not present

## 2022-08-25 DIAGNOSIS — C44622 Squamous cell carcinoma of skin of right upper limb, including shoulder: Secondary | ICD-10-CM | POA: Diagnosis not present

## 2022-08-27 DIAGNOSIS — R2689 Other abnormalities of gait and mobility: Secondary | ICD-10-CM | POA: Diagnosis not present

## 2022-08-27 DIAGNOSIS — G9009 Other idiopathic peripheral autonomic neuropathy: Secondary | ICD-10-CM | POA: Diagnosis not present

## 2022-08-27 DIAGNOSIS — M6281 Muscle weakness (generalized): Secondary | ICD-10-CM | POA: Diagnosis not present

## 2022-08-27 DIAGNOSIS — R2681 Unsteadiness on feet: Secondary | ICD-10-CM | POA: Diagnosis not present

## 2022-09-02 DIAGNOSIS — G9009 Other idiopathic peripheral autonomic neuropathy: Secondary | ICD-10-CM | POA: Diagnosis not present

## 2022-09-02 DIAGNOSIS — R2689 Other abnormalities of gait and mobility: Secondary | ICD-10-CM | POA: Diagnosis not present

## 2022-09-02 DIAGNOSIS — R2681 Unsteadiness on feet: Secondary | ICD-10-CM | POA: Diagnosis not present

## 2022-09-02 DIAGNOSIS — M6281 Muscle weakness (generalized): Secondary | ICD-10-CM | POA: Diagnosis not present

## 2022-09-13 ENCOUNTER — Inpatient Hospital Stay (HOSPITAL_COMMUNITY)
Admission: EM | Admit: 2022-09-13 | Discharge: 2022-09-17 | DRG: 682 | Disposition: A | Discharge status: Skilled Nursing Facility | Payer: Medicare PPO | Attending: Internal Medicine | Admitting: Internal Medicine

## 2022-09-13 ENCOUNTER — Encounter (HOSPITAL_COMMUNITY): Payer: Self-pay | Admitting: Internal Medicine

## 2022-09-13 ENCOUNTER — Emergency Department (HOSPITAL_COMMUNITY): Payer: Medicare PPO

## 2022-09-13 ENCOUNTER — Other Ambulatory Visit: Payer: Self-pay

## 2022-09-13 ENCOUNTER — Inpatient Hospital Stay (HOSPITAL_COMMUNITY): Payer: Medicare PPO

## 2022-09-13 DIAGNOSIS — I959 Hypotension, unspecified: Secondary | ICD-10-CM | POA: Diagnosis not present

## 2022-09-13 DIAGNOSIS — G250 Essential tremor: Secondary | ICD-10-CM | POA: Diagnosis present

## 2022-09-13 DIAGNOSIS — F419 Anxiety disorder, unspecified: Secondary | ICD-10-CM | POA: Diagnosis present

## 2022-09-13 DIAGNOSIS — Z888 Allergy status to other drugs, medicaments and biological substances status: Secondary | ICD-10-CM

## 2022-09-13 DIAGNOSIS — I700 Atherosclerosis of aorta: Secondary | ICD-10-CM | POA: Diagnosis present

## 2022-09-13 DIAGNOSIS — N19 Unspecified kidney failure: Secondary | ICD-10-CM | POA: Diagnosis not present

## 2022-09-13 DIAGNOSIS — R531 Weakness: Secondary | ICD-10-CM | POA: Diagnosis not present

## 2022-09-13 DIAGNOSIS — A419 Sepsis, unspecified organism: Secondary | ICD-10-CM | POA: Diagnosis not present

## 2022-09-13 DIAGNOSIS — Z87891 Personal history of nicotine dependence: Secondary | ICD-10-CM | POA: Diagnosis not present

## 2022-09-13 DIAGNOSIS — M4854XA Collapsed vertebra, not elsewhere classified, thoracic region, initial encounter for fracture: Secondary | ICD-10-CM | POA: Diagnosis present

## 2022-09-13 DIAGNOSIS — E871 Hypo-osmolality and hyponatremia: Secondary | ICD-10-CM | POA: Diagnosis not present

## 2022-09-13 DIAGNOSIS — Z79899 Other long term (current) drug therapy: Secondary | ICD-10-CM | POA: Diagnosis not present

## 2022-09-13 DIAGNOSIS — E872 Acidosis, unspecified: Secondary | ICD-10-CM | POA: Diagnosis not present

## 2022-09-13 DIAGNOSIS — Z85828 Personal history of other malignant neoplasm of skin: Secondary | ICD-10-CM | POA: Diagnosis not present

## 2022-09-13 DIAGNOSIS — Z8249 Family history of ischemic heart disease and other diseases of the circulatory system: Secondary | ICD-10-CM

## 2022-09-13 DIAGNOSIS — Z681 Body mass index (BMI) 19 or less, adult: Secondary | ICD-10-CM

## 2022-09-13 DIAGNOSIS — R1084 Generalized abdominal pain: Secondary | ICD-10-CM | POA: Diagnosis not present

## 2022-09-13 DIAGNOSIS — Z86010 Personal history of colonic polyps: Secondary | ICD-10-CM

## 2022-09-13 DIAGNOSIS — R112 Nausea with vomiting, unspecified: Secondary | ICD-10-CM | POA: Diagnosis present

## 2022-09-13 DIAGNOSIS — E8721 Acute metabolic acidosis: Secondary | ICD-10-CM | POA: Diagnosis present

## 2022-09-13 DIAGNOSIS — R1110 Vomiting, unspecified: Secondary | ICD-10-CM | POA: Diagnosis not present

## 2022-09-13 DIAGNOSIS — R000 Tachycardia, unspecified: Secondary | ICD-10-CM | POA: Diagnosis present

## 2022-09-13 DIAGNOSIS — L8990 Pressure ulcer of unspecified site, unspecified stage: Secondary | ICD-10-CM | POA: Diagnosis present

## 2022-09-13 DIAGNOSIS — E7800 Pure hypercholesterolemia, unspecified: Secondary | ICD-10-CM | POA: Diagnosis present

## 2022-09-13 DIAGNOSIS — E876 Hypokalemia: Secondary | ICD-10-CM | POA: Diagnosis not present

## 2022-09-13 DIAGNOSIS — Z885 Allergy status to narcotic agent status: Secondary | ICD-10-CM | POA: Diagnosis not present

## 2022-09-13 DIAGNOSIS — U071 COVID-19: Secondary | ICD-10-CM | POA: Diagnosis present

## 2022-09-13 DIAGNOSIS — R109 Unspecified abdominal pain: Secondary | ICD-10-CM | POA: Diagnosis not present

## 2022-09-13 DIAGNOSIS — A0839 Other viral enteritis: Secondary | ICD-10-CM | POA: Diagnosis not present

## 2022-09-13 DIAGNOSIS — N271 Small kidney, bilateral: Secondary | ICD-10-CM | POA: Diagnosis present

## 2022-09-13 DIAGNOSIS — R197 Diarrhea, unspecified: Secondary | ICD-10-CM | POA: Diagnosis not present

## 2022-09-13 DIAGNOSIS — F32A Depression, unspecified: Secondary | ICD-10-CM | POA: Diagnosis not present

## 2022-09-13 DIAGNOSIS — K529 Noninfective gastroenteritis and colitis, unspecified: Secondary | ICD-10-CM | POA: Diagnosis present

## 2022-09-13 DIAGNOSIS — J439 Emphysema, unspecified: Secondary | ICD-10-CM | POA: Diagnosis present

## 2022-09-13 DIAGNOSIS — R0989 Other specified symptoms and signs involving the circulatory and respiratory systems: Secondary | ICD-10-CM | POA: Diagnosis not present

## 2022-09-13 DIAGNOSIS — R636 Underweight: Secondary | ICD-10-CM | POA: Diagnosis present

## 2022-09-13 DIAGNOSIS — N179 Acute kidney failure, unspecified: Secondary | ICD-10-CM | POA: Diagnosis not present

## 2022-09-13 DIAGNOSIS — K219 Gastro-esophageal reflux disease without esophagitis: Secondary | ICD-10-CM | POA: Diagnosis present

## 2022-09-13 DIAGNOSIS — K8020 Calculus of gallbladder without cholecystitis without obstruction: Secondary | ICD-10-CM | POA: Diagnosis present

## 2022-09-13 DIAGNOSIS — Z825 Family history of asthma and other chronic lower respiratory diseases: Secondary | ICD-10-CM

## 2022-09-13 DIAGNOSIS — L89151 Pressure ulcer of sacral region, stage 1: Secondary | ICD-10-CM | POA: Diagnosis present

## 2022-09-13 DIAGNOSIS — I10 Essential (primary) hypertension: Secondary | ICD-10-CM | POA: Diagnosis not present

## 2022-09-13 DIAGNOSIS — Z7401 Bed confinement status: Secondary | ICD-10-CM

## 2022-09-13 DIAGNOSIS — Z801 Family history of malignant neoplasm of trachea, bronchus and lung: Secondary | ICD-10-CM

## 2022-09-13 LAB — URINALYSIS, ROUTINE W REFLEX MICROSCOPIC
Bilirubin Urine: NEGATIVE
Glucose, UA: NEGATIVE mg/dL
Ketones, ur: NEGATIVE mg/dL
Leukocytes,Ua: NEGATIVE
Nitrite: NEGATIVE
Protein, ur: 30 mg/dL — AB
Specific Gravity, Urine: 1.015 (ref 1.005–1.030)
pH: 5 (ref 5.0–8.0)

## 2022-09-13 LAB — COMPREHENSIVE METABOLIC PANEL
ALT: 24 U/L (ref 0–44)
AST: 31 U/L (ref 15–41)
Albumin: 2.9 g/dL — ABNORMAL LOW (ref 3.5–5.0)
Alkaline Phosphatase: 120 U/L (ref 38–126)
Anion gap: >20 — ABNORMAL HIGH (ref 5–15)
BUN: 74 mg/dL — ABNORMAL HIGH (ref 8–23)
CO2: 13 mmol/L — ABNORMAL LOW (ref 22–32)
Calcium: 7.3 mg/dL — ABNORMAL LOW (ref 8.9–10.3)
Chloride: 89 mmol/L — ABNORMAL LOW (ref 98–111)
Creatinine, Ser: 6.62 mg/dL — ABNORMAL HIGH (ref 0.44–1.00)
GFR, Estimated: 6 mL/min — ABNORMAL LOW (ref >60)
Glucose, Bld: 131 mg/dL — ABNORMAL HIGH (ref 70–99)
Potassium: 2.1 mmol/L — CL (ref 3.5–5.1)
Sodium: 123 mmol/L — ABNORMAL LOW (ref 135–145)
Total Bilirubin: 0.7 mg/dL (ref 0.3–1.2)
Total Protein: 6.7 g/dL (ref 6.5–8.1)

## 2022-09-13 LAB — MAGNESIUM: Magnesium: 2 mg/dL (ref 1.7–2.4)

## 2022-09-13 LAB — CBC
HCT: 35.3 % — ABNORMAL LOW (ref 36.0–46.0)
Hemoglobin: 12.7 g/dL (ref 12.0–15.0)
MCH: 33.9 pg (ref 26.0–34.0)
MCHC: 36 g/dL (ref 30.0–36.0)
MCV: 94.1 fL (ref 80.0–100.0)
Platelets: 245 10*3/uL (ref 150–400)
RBC: 3.75 MIL/uL — ABNORMAL LOW (ref 3.87–5.11)
RDW: 14.1 % (ref 11.5–15.5)
WBC: 19 10*3/uL — ABNORMAL HIGH (ref 4.0–10.5)
nRBC: 0 % (ref 0.0–0.2)

## 2022-09-13 LAB — BASIC METABOLIC PANEL
Anion gap: 20 — ABNORMAL HIGH (ref 5–15)
BUN: 70 mg/dL — ABNORMAL HIGH (ref 8–23)
CO2: 10 mmol/L — ABNORMAL LOW (ref 22–32)
Calcium: 6.7 mg/dL — ABNORMAL LOW (ref 8.9–10.3)
Chloride: 94 mmol/L — ABNORMAL LOW (ref 98–111)
Creatinine, Ser: 5.66 mg/dL — ABNORMAL HIGH (ref 0.44–1.00)
GFR, Estimated: 7 mL/min — ABNORMAL LOW (ref >60)
Glucose, Bld: 77 mg/dL (ref 70–99)
Potassium: <2 mmol/L — CL (ref 3.5–5.1)
Sodium: 124 mmol/L — ABNORMAL LOW (ref 135–145)

## 2022-09-13 LAB — PROTEIN / CREATININE RATIO, URINE
Creatinine, Urine: 58 mg/dL
Protein Creatinine Ratio: 1 mg/mg{Cre} — ABNORMAL HIGH (ref 0.00–0.15)
Total Protein, Urine: 58 mg/dL

## 2022-09-13 LAB — I-STAT CG4 LACTIC ACID, ED: Lactic Acid, Venous: 1.5 mmol/L (ref 0.5–1.9)

## 2022-09-13 LAB — LIPASE, BLOOD: Lipase: 118 U/L — ABNORMAL HIGH (ref 11–51)

## 2022-09-13 MED ORDER — ACETAMINOPHEN 650 MG RE SUPP: 650.0000 mg | Freq: Four times a day (QID) | RECTAL | Status: DC | PRN

## 2022-09-13 MED ORDER — ORAL CARE MOUTH RINSE: 15.0000 mL | OROMUCOSAL | Status: DC | PRN

## 2022-09-13 MED ORDER — PRIMIDONE 50 MG PO TABS
100.0000 mg | ORAL_TABLET | Freq: Every day | ORAL | Status: DC
  Administered 2022-09-14 – 2022-09-15 (×2): 100 mg via ORAL
  Filled 2022-09-13 (×3): qty 2

## 2022-09-13 MED ORDER — ONDANSETRON HCL 4 MG/2ML IJ SOLN: 4.0000 mg | Freq: Four times a day (QID) | INTRAMUSCULAR | Status: DC | PRN

## 2022-09-13 MED ORDER — NIRMATRELVIR/RITONAVIR (PAXLOVID) TABLET (RENAL DOSING): 2.0000 | ORAL_TABLET | Freq: Two times a day (BID) | ORAL | Status: DC

## 2022-09-13 MED ORDER — POTASSIUM CHLORIDE 10 MEQ/100ML IV SOLN
10.0000 meq | INTRAVENOUS | Status: AC
  Administered 2022-09-13 – 2022-09-14 (×4): 10 meq via INTRAVENOUS
  Filled 2022-09-13 (×4): qty 100

## 2022-09-13 MED ORDER — ACETAMINOPHEN 325 MG PO TABS: 650.0000 mg | ORAL_TABLET | Freq: Four times a day (QID) | ORAL | Status: DC | PRN

## 2022-09-13 MED ORDER — DULOXETINE HCL 20 MG PO CPEP
20.0000 mg | ORAL_CAPSULE | Freq: Every day | ORAL | Status: DC
  Administered 2022-09-14 – 2022-09-17 (×4): 20 mg via ORAL
  Filled 2022-09-13 (×4): qty 1

## 2022-09-13 MED ORDER — ENSURE ENLIVE PO LIQD
237.0000 mL | Freq: Two times a day (BID) | ORAL | Status: DC
  Administered 2022-09-15 – 2022-09-17 (×4): 237 mL via ORAL

## 2022-09-13 MED ORDER — POTASSIUM CHLORIDE CRYS ER 20 MEQ PO TBCR
40.0000 meq | EXTENDED_RELEASE_TABLET | Freq: Once | ORAL | Status: AC
  Administered 2022-09-13: 40 meq via ORAL
  Filled 2022-09-13: qty 2

## 2022-09-13 MED ORDER — ONDANSETRON HCL 4 MG/2ML IJ SOLN
4.0000 mg | Freq: Once | INTRAMUSCULAR | Status: DC
  Filled 2022-09-13: qty 2

## 2022-09-13 MED ORDER — PRIMIDONE 50 MG PO TABS: 100.0000 mg | ORAL_TABLET | Freq: Two times a day (BID) | ORAL | Status: DC

## 2022-09-13 MED ORDER — HEPARIN SODIUM (PORCINE) 5000 UNIT/ML IJ SOLN
5000.0000 [IU] | Freq: Three times a day (TID) | INTRAMUSCULAR | Status: DC
  Administered 2022-09-13 – 2022-09-17 (×11): 5000 [IU] via SUBCUTANEOUS
  Filled 2022-09-13 (×12): qty 1

## 2022-09-13 MED ORDER — SODIUM CHLORIDE 0.9 % IV BOLUS
500.0000 mL | Freq: Once | INTRAVENOUS | Status: AC
  Administered 2022-09-13: 500 mL via INTRAVENOUS

## 2022-09-13 MED ORDER — BUDESONIDE 3 MG PO CPEP
9.0000 mg | ORAL_CAPSULE | Freq: Every day | ORAL | Status: DC
  Administered 2022-09-14 – 2022-09-17 (×4): 9 mg via ORAL
  Filled 2022-09-13 (×4): qty 3

## 2022-09-13 MED ORDER — LACTATED RINGERS IV SOLN: INTRAVENOUS | Status: DC

## 2022-09-13 MED ORDER — POTASSIUM CHLORIDE 10 MEQ/100ML IV SOLN
10.0000 meq | INTRAVENOUS | Status: DC
  Administered 2022-09-13: 10 meq via INTRAVENOUS
  Filled 2022-09-13: qty 100

## 2022-09-13 MED ORDER — PANTOPRAZOLE SODIUM 40 MG PO TBEC
40.0000 mg | DELAYED_RELEASE_TABLET | Freq: Every day | ORAL | Status: DC
  Administered 2022-09-14 – 2022-09-17 (×4): 40 mg via ORAL
  Filled 2022-09-13 (×4): qty 1

## 2022-09-13 MED ORDER — ONDANSETRON HCL 4 MG PO TABS
4.0000 mg | ORAL_TABLET | Freq: Four times a day (QID) | ORAL | Status: DC | PRN
  Administered 2022-09-15: 4 mg via ORAL
  Filled 2022-09-13: qty 1

## 2022-09-13 MED ORDER — SODIUM CHLORIDE 0.9 % IV BOLUS
1000.0000 mL | Freq: Once | INTRAVENOUS | Status: AC
  Administered 2022-09-13: 1000 mL via INTRAVENOUS

## 2022-09-13 MED ORDER — LOPERAMIDE HCL 2 MG PO CAPS
4.0000 mg | ORAL_CAPSULE | Freq: Two times a day (BID) | ORAL | Status: DC | PRN
  Administered 2022-09-13: 4 mg via ORAL
  Filled 2022-09-13: qty 2

## 2022-09-13 MED ORDER — CALCIUM GLUCONATE-NACL 1-0.675 GM/50ML-% IV SOLN
1.0000 g | Freq: Once | INTRAVENOUS | Status: AC
  Administered 2022-09-13: 1000 mg via INTRAVENOUS
  Filled 2022-09-13: qty 50

## 2022-09-13 NOTE — ED Provider Notes (Signed)
Accepted handoff at shift change from [facility]. Please see prior provider note for more detail.   Briefly: Patient is 82 y.o.   DDX: concern for Recent covid dx, new abdominal pain, nausea, vomiting, diarrhea. Lives in assisted living facility with husband. Has been laying in bed for 3 weeks. No focal neuro deficits. Came in hypoxic, tachycardic -- no resp abnormality, back on room air now.   Foley cath -- LR infusion. Oral potassium to start. Added on mag. CT chest abd pelvis wo contrast. Malen Gauze with nephrology with consult.  Plan: Admit to hospitalist, versus ICU if hospitalist declines, criteria for possible critical care is hypokalemia.  However patient is now stable oxygen saturation on room air.  Mild tachycardia remains.  CT chest abdomen pelvis to look for possible renal obstruction versus other etiology.  Renal ultrasound pending.  Patient will receive oral potassium to start, magnesium.   Consults: I spoke with hospitalist, spoke with Robb Matar who agrees to admission.       RISR  EDTHIS    [place] 09/13/22 1532    Lorre Nick, MD 09/15/22 [phone]

## 2022-09-13 NOTE — ED Notes (Signed)
ED TO INPATIENT HANDOFF REPORT  ED Nurse Name and Phone #: Dorene Sorrow Name/Age/Gender [name] 82 y.o. female Room/Bed: WA08/WA08  Code Status   Code Status: Prior  Home/SNF/Other Skilled nursing facility Patient oriented to: self, place, time, and situation Is this baseline? Yes   Triage Complete: Triage complete  Chief Complaint AKI (acute kidney injury) (HCC) [N17.9]  Triage Note Pt BIB EMS from [facility] c/o abd pain, n/v/d. Pt testes positive for covid and is on day 4 of Paxlovid   Allergies Allergies  Allergen Reactions   Macrodantin [Nitrofurantoin Macrocrystal] Shortness Of Breath, Itching, Nausea And Vomiting, Swelling and Rash    Tongue swelling   Topamax [Topiramate] Other (See Comments)    Loss of taste, fatigue!!!!!!   Diclofenac Other (See Comments)    Stomach burning   Tramadol Other (See Comments)    Stomach burning   Rice Nausea And Vomiting   Rosemary Oil Nausea And Vomiting   Codeine Nausea And Vomiting    Level of Care/Admitting Diagnosis ED Disposition     ED Disposition  Admit   Condition  --   Comment  Hospital Area: [facility] [[id number]]  Level of Care: Progressive [102]  Admit to Progressive based on following criteria: MULTISYSTEM THREATS such as stable sepsis, metabolic/electrolyte imbalance with or without encephalopathy that is responding to early treatment.  May admit patient to [facility] or [facility] if equivalent level of care is available:: No  Covid Evaluation: Asymptomatic - no recent exposure (last 10 days) testing not required  Diagnosis: AKI (acute kidney injury) [facility]) [[id number]]  Admitting Physician: Bobette Mo [[id number]]  Attending Physician: Bobette Mo [[id number]]  Certification:: I certify this patient will need inpatient services for at least 2 midnights  Expected Medical Readiness: 09/15/2022          B Medical/Surgery History Past Medical History:  Diagnosis Date    Anxiety    Chronic diarrhea    Coarse tremors    Colon polyp    DDD (degenerative disc disease), cervical    Hiatal hernia    History of skin cancer    Hypercholesteremia    Hypertension    Migraines    Osteoporosis    Shortness of breath dyspnea    with exertion   Tremor    Weight loss    Past Surgical History:  Procedure Laterality Date   BIOPSY  07/10/2022   Procedure: BIOPSY;  Surgeon: Charlott Rakes, MD;  Location: [facility];  Service: Gastroenterology;;   ESOPHAGEAL MANOMETRY N/A 01/15/2015   Procedure: ESOPHAGEAL MANOMETRY (EM);  Surgeon: Graylin Shiver, MD;  Location: [facility];  Service: Endoscopy;  Laterality: N/A;   ESOPHAGOGASTRODUODENOSCOPY (EGD) WITH PROPOFOL Left 07/10/2022   Procedure: ESOPHAGOGASTRODUODENOSCOPY (EGD) WITH PROPOFOL;  Surgeon: Charlott Rakes, MD;  Location: [facility];  Service: Gastroenterology;  Laterality: Left;   HIATAL HERNIA REPAIR     KYPHOPLASTY N/A 12/22/2017   Procedure: LUMBAR TWO KYPHOPLASTY;  Surgeon: Estill Bamberg, MD;  Location: [facility];  Service: Orthopedics;  Laterality: N/A;  LUMBAR TWO KYPHOPLASTY   RHINOPLASTY     SINUS SURGERY WITH INSTATRAK       A IV Location/Drains/Wounds Patient Lines/Drains/Airways Status     Active Line/Drains/Airways     Name Placement date Placement time Site Days   Peripheral IV 09/13/22 18 G Left Antecubital 09/13/22  1204  Antecubital  less than 1   Peripheral IV 09/13/22 20 G Anterior;Distal;Left Forearm 09/13/22  1204  Forearm  less than 1            Intake/Output Last 24 hours No intake or output data in the 24 hours ending 09/13/22 1539  Labs/Imaging Results for orders placed or performed during the hospital encounter of 09/13/22 (from the past 48 hour(s))  CBC     Status: Abnormal   Collection Time: 09/13/22  1:30 PM  Result Value Ref Range   WBC 19.0 (H) 4.0 - 10.5 K/uL   RBC 3.75 (L) 3.87 - 5.11 MIL/uL   Hemoglobin 12.7 12.0 - 15.0 g/dL   HCT 96.0 (L) 45.4 - 09.8  %   MCV 94.1 80.0 - 100.0 fL   MCH 33.9 26.0 - 34.0 pg   MCHC 36.0 30.0 - 36.0 g/dL   RDW 11.9 14.7 - 82.9 %   Platelets 245 150 - 400 K/uL   nRBC 0.0 0.0 - 0.2 %    Comment: Performed at [facility], [address], [place], [place] [address]  Comprehensive metabolic panel     Status: Abnormal   Collection Time: 09/13/22  1:30 PM  Result Value Ref Range   Sodium 123 (L) 135 - 145 mmol/L    Comment: ELECTROLYTES REPEATED TO VERIFY   Potassium 2.1 (LL) 3.5 - 5.1 mmol/L    Comment: CRITICAL RESULT CALLED TO, READ BACK BY AND VERIFIED WITH Hyder Deman C @ 1440 ON 09/13/22 CAL    Chloride 89 (L) 98 - 111 mmol/L    Comment: ELECTROLYTES REPEATED TO VERIFY   CO2 13 (L) 22 - 32 mmol/L    Comment: ELECTROLYTES REPEATED TO VERIFY   Glucose, Bld 131 (H) 70 - 99 mg/dL    Comment: Glucose reference range applies only to samples taken after fasting for at least 8 hours.   BUN 74 (H) 8 - 23 mg/dL   Creatinine, Ser 0.86 (H) 0.44 - 1.00 mg/dL   Calcium 7.3 (L) 8.9 - 10.3 mg/dL   Total Protein 6.7 6.5 - 8.1 g/dL   Albumin 2.9 (L) 3.5 - 5.0 g/dL   AST 31 15 - 41 U/L   ALT 24 0 - 44 U/L   Alkaline Phosphatase 120 38 - 126 U/L   Total Bilirubin 0.7 0.3 - 1.2 mg/dL   GFR, Estimated 6 (L) >60 mL/min    Comment: (NOTE) Calculated using the CKD-EPI Creatinine Equation (2021)    Anion gap >20 (H) 5 - 15    Comment: ELECTROLYTES REPEATED TO VERIFY Performed at [facility], [address], [place], [place] [address]   Lipase, blood     Status: Abnormal   Collection Time: 09/13/22  1:30 PM  Result Value Ref Range   Lipase 118 (H) 11 - 51 U/L    Comment: Performed at [facility], [address] [place], [place] [address]  I-Stat CG4 Lactic Acid     Status: None   Collection Time: 09/13/22  2:17 PM  Result Value Ref Range   Lactic Acid, Venous 1.5 0.5 - 1.9 mmol/L   DG Chest Portable 1 View  Result Date: 09/13/2022 CLINICAL DATA:  COVID  diagnosis with abdominal pain, nausea, vomiting, and diarrhea EXAM: PORTABLE CHEST 1 VIEW COMPARISON:  Chest radiograph dated 07/08/2022 FINDINGS: Low lung volumes. No focal consolidations. No pleural effusion or pneumothorax. The heart size and mediastinal contours are within normal limits. No acute osseous abnormality. IMPRESSION: Low lung volumes without focal consolidation. Electronically Signed   By: Agustin Cree M.D.   On: 09/13/2022  14:05    Pending Labs Unresulted Labs (From admission, onward)     Start     Ordered   09/13/22 1518  Basic metabolic panel  Once,   STAT       Comments: This is an intentional redraw - do not add on to a previous sample. Thank you - Nephrology    09/13/22 1518   09/13/22 1517  Magnesium  Once,   STAT        09/13/22 1516   09/13/22 1434  Blood culture (routine x 2)  BLOOD CULTURE X 2,   R      09/13/22 1434   09/13/22 1305  Urinalysis, Routine w reflex microscopic -Urine, Clean Catch  Once,   URGENT       Question:  Specimen Source  Answer:  Urine, Clean Catch   09/13/22 1305            Vitals/Pain Today's Vitals   09/13/22 1205 09/13/22 1500  BP: (!) 166/60 127/80  Pulse: (!) 102 87  Resp: 18 16  Temp: (!) 97.5 F (36.4 C)   TempSrc: Oral   SpO2: 99% 100%  Weight: 46 kg   Height: 5\' 4"  (1.626 m)   PainSc: 8      Isolation Precautions No active isolations  Medications Medications  ondansetron (ZOFRAN) injection 4 mg (4 mg Intravenous Not Given 09/13/22 1410)  lactated ringers infusion (has no administration in time range)  sodium chloride 0.9 % bolus 1,000 mL (has no administration in time range)  sodium chloride 0.9 % bolus 500 mL (500 mLs Intravenous New Bag/Given 09/13/22 1409)  potassium chloride SA (KLOR-CON M) CR tablet 40 mEq (40 mEq Oral Given 09/13/22 1532)    Mobility walks     Focused Assessments     R Recommendations: See Admitting Provider Note  Report given to:   Additional Notes:

## 2022-09-13 NOTE — H&P (Addendum)
History and Physical    Patient: Brenda [name] WUX:[id number] DOB: 1940-01-25 DOA: 09/13/2022 DOS: the patient was seen and examined on 09/13/2022 PCP: Clovis Riley, L.August Saucer, MD  Patient coming from: Home  Chief Complaint:  Chief Complaint  Patient presents with   Abdominal Pain   HPI: Brenda [name] is a 82 y.o. female with medical history significant of anxiety, chronic diarrhea, coarse tremors, colon polyps, cervical DDD, hiatal hernia, skin cancer, hyperlipidemia, hypertension, migraine headaches, osteoporosis, tremor, history of weight loss who is coming to the emergency department due to abdominal pain, nausea, vomiting and diarrhea.  She was recently diagnosed with COVID-19 and is on day 4 of treatment with Paxlovid.  She has been lightheaded.  No vomiting in the past few days, but significant dry heaving. No constipation, melena or hematochezia.  No flank pain, dysuria, frequency or hematuria but is anuric since yesterday.  She denied fever, chills, rhinorrhea, sore throat, wheezing or hemoptysis.  No chest pain, palpitations, diaphoresis, PND, orthopnea or pitting edema of the lower extremities. No polyuria, polydipsia, polyphagia or blurred vision.   Lab work: CBC showed white count of 19.0, hemoglobin 12.7 g/dL platelets 253.  Lactic acid was normal.  Lipase and 18 units/L.  CMP showed a sodium 123, potassium 2.1, chloride 89 and CO2 13 mmol/L.  Anion gap was greater than 20.  Glucose 131, BUN 74, creatinine 6.62 and corrected calcium 8.4 mg/dL.  LFTs were normal with the exception of an albumin level 2.9 g/dL.  Imaging: Portable 1 view chest radiograph with low lung volumes without focal consolidation.  Normal renal ultrasound, CT chest/abdomen/pelvis without contrast still pending.   ED course: Initial vital signs were temperature 97.5 F, pulse 72, respiration 18, BP 166/60 mmHg and O2 sat 99% on room air.  Patient received KCl 40 mEq p.o. x 1 and sodium chloride 500 mL bolus.  Nephrology  was consulted.  I added normal saline 1000 mL bolus over 2 hours.  Review of Systems: As mentioned in the history of present illness. All other systems reviewed and are negative. Past Medical History:  Diagnosis Date   Anxiety    Chronic diarrhea    Coarse tremors    Colon polyp    DDD (degenerative disc disease), cervical    Hiatal hernia    History of skin cancer    Hypercholesteremia    Hypertension    Migraines    Osteoporosis    Shortness of breath dyspnea    with exertion   Tremor    Weight loss    Past Surgical History:  Procedure Laterality Date   BIOPSY  07/10/2022   Procedure: BIOPSY;  Surgeon: Charlott Rakes, MD;  Location: [facility];  Service: Gastroenterology;;   ESOPHAGEAL MANOMETRY N/A 01/15/2015   Procedure: ESOPHAGEAL MANOMETRY (EM);  Surgeon: Graylin Shiver, MD;  Location: [facility];  Service: Endoscopy;  Laterality: N/A;   ESOPHAGOGASTRODUODENOSCOPY (EGD) WITH PROPOFOL Left 07/10/2022   Procedure: ESOPHAGOGASTRODUODENOSCOPY (EGD) WITH PROPOFOL;  Surgeon: Charlott Rakes, MD;  Location: [facility];  Service: Gastroenterology;  Laterality: Left;   HIATAL HERNIA REPAIR     KYPHOPLASTY N/A 12/22/2017   Procedure: LUMBAR TWO KYPHOPLASTY;  Surgeon: Estill Bamberg, MD;  Location: [facility];  Service: Orthopedics;  Laterality: N/A;  LUMBAR TWO KYPHOPLASTY   RHINOPLASTY     SINUS SURGERY WITH INSTATRAK     Social History:  reports that she quit smoking about 34 years ago. Her smoking use included cigarettes. She has never used smokeless tobacco. She  reports current alcohol use. She reports that she does not use drugs.  Allergies  Allergen Reactions   Macrodantin [Nitrofurantoin Macrocrystal] Shortness Of Breath, Itching, Nausea And Vomiting, Swelling and Rash    Tongue swelling   Topamax [Topiramate] Other (See Comments)    Loss of taste, fatigue!!!!!!   Diclofenac Other (See Comments)    Stomach burning   Tramadol Other (See Comments)    Stomach burning    Rice Nausea And Vomiting   Rosemary Oil Nausea And Vomiting   Codeine Nausea And Vomiting    Family History  Problem Relation Age of Onset   Lung cancer Mother    COPD Father    Tremor Father    Hypertension Brother    Tremor Brother     Prior to Admission medications   Medication Sig Start Date End Date Taking? Authorizing Provider  ALPRAZolam (XANAX) 0.25 MG tablet Take 1 tablet (0.25 mg total) by mouth daily as needed (tremors). 08/23/19   Narda Bonds, MD  Apoaequorin (PREVAGEN PO) Take 1 capsule by mouth daily.    [provider]  atorvastatin (LIPITOR) 20 MG tablet Take 20 mg by mouth daily.    [provider]  budesonide (ENTOCORT EC) 3 MG 24 hr capsule Take 9 mg by mouth in the morning.    [provider]  DULoxetine (CYMBALTA) 20 MG capsule Take 20 mg by mouth daily.    [provider]  loperamide (IMODIUM A-D) 2 MG tablet Take 4-6 mg by mouth See admin instructions. Take 6 mg by mouth in the morning and an additional 4 mg with each subsequent loose stool    [provider]  Multiple Vitamins-Minerals (PRESERVISION AREDS 2) CAPS Take 1 capsule by mouth daily.    [provider]  Nutritional Supplements (,FEEDING SUPPLEMENT, PROSOURCE PLUS) liquid Take 30 mLs by mouth 3 (three) times daily between meals. Patient not taking: Reported on 07/08/2022 08/23/19   Narda Bonds, MD  pantoprazole (PROTONIX) 40 MG tablet Take 1 tablet (40 mg total) by mouth daily. Follow up with gastroenterology 07/15/22 09/13/22  Zigmund Daniel., MD  primidone (MYSOLINE) 50 MG tablet Take 2 tablets (100 mg total) by mouth 3 (three) times daily. Must keep follow up 07/07/2020 for ongoing refills Patient taking differently: Take 100 mg by mouth in the morning, at noon, and at bedtime. 06/04/20   Butch Penny, NP  promethazine (PHENERGAN) 25 MG tablet Take 25 mg by mouth 5 (five) times daily as needed for nausea or vomiting.  08/10/19   [provider]  Vitamin D, Ergocalciferol, (DRISDOL) 1.25 MG (50000 UNIT) CAPS capsule Take 50,000 Units by mouth every Monday.    [provider]    Physical Exam: Vitals:   09/13/22 1205 09/13/22 1500  BP: (!) 166/60 127/80  Pulse: (!) 102 87  Resp: 18 16  Temp: (!) 97.5 F (36.4 C)   TempSrc: Oral   SpO2: 99% 100%  Weight: 46 kg   Height: 5\' 4"  (1.626 m)    Physical Exam Vitals and nursing note reviewed.  Constitutional:      General: She is awake. She is not in acute distress.    Appearance: She is underweight. She is ill-appearing.  HENT:     Head: Normocephalic.     Nose: No rhinorrhea.     Mouth/Throat:     Mouth: Mucous membranes are dry.  Eyes:     General: No scleral icterus.    Pupils: Pupils  are equal, round, and reactive to light.  Neck:     Vascular: No JVD.  Cardiovascular:     Rate and Rhythm: Normal rate and regular rhythm.     Heart sounds: S1 normal and S2 normal.  Pulmonary:     Effort: Pulmonary effort is normal.     Breath sounds: Normal breath sounds. No wheezing, rhonchi or rales.  Abdominal:     Palpations: Abdomen is soft.     Tenderness: There is no abdominal tenderness.  Musculoskeletal:     Cervical back: Neck supple.     Right lower leg: No edema.     Left lower leg: No edema.  Skin:    General: Skin is warm and dry.  Neurological:     General: No focal deficit present.     Mental Status: She is alert and oriented to person, place, and time.  Psychiatric:        Mood and Affect: Mood normal.        Behavior: Behavior normal. Behavior is cooperative.    Data Reviewed:  Results are pending, will review when available.  Assessment and Plan: Principal Problem:   Nausea vomiting and diarrhea Resulting in:   AKI (acute kidney injury) (HCC) Observation/telemetry. Continue IV fluids. Avoid hypotension. Avoid nephrotoxins. Monitor intake and output. Monitor renal function and electrolytes.  Active Problems:    Hypokalemia Replacement given earlier. Follow-up potassium level this evening.    COVID-19 virus infection  For Paxlovid due to renal function. Symptomatic treatment and supportive care.    GERD (gastroesophageal reflux disease)    s/p Nissen fundoplication 03/26/2015 Continue pantoprazole 40 mg p.o. daily.    Enteritis   Diarrhea Continue Entocort 9 mg p.o. daily. Continue loperamide as needed. Follow-up with GI as an outpatient.    Pressure injury of skin Continue local care.    Essential tremor Continue primidone 100 mg p.o. twice daily.     Advance Care Planning:   Code Status: Full Code   Consults:   Family Communication:   Severity of Illness: The appropriate patient status for this patient is INPATIENT. Inpatient status is judged to be reasonable and necessary in order to provide the required intensity of service to ensure the patient's safety. The patient's presenting symptoms, physical exam findings, and initial radiographic and laboratory data in the context of their chronic comorbidities is felt to place them at high risk for further clinical deterioration. Furthermore, it is not anticipated that the patient will be medically stable for discharge from the hospital within 2 midnights of admission.   * I certify that at the point of admission it is my clinical judgment that the patient will require inpatient hospital care spanning beyond 2 midnights from the point of admission due to high intensity of service, high risk for further deterioration and high frequency of surveillance required.*  Author: Bobette Mo, MD 09/13/2022 3:43 PM  For on call review [email or web address].   This document was prepared using Dragon voice recognition software and may contain some unintended transcription errors.

## 2022-09-13 NOTE — Consult Note (Signed)
[facility] Renal Consultation Note  Requesting MD: Delice Bison, PA and Sanda Klein, MD Indication for Consultation:  AKI   Chief complaint: diarrhea and abdominal pain    HPI:  Brenda [name] is a 82 y.o. female with a history of HTN, anxiety, migraine, and charted history of chronic diarrhea who presented for evaluation of abdominal pain, n/v and diarrhea.  She was found to have AKI with Cr 6.62.  She was also found to have marked hypokalemia.  Nephrology is consulted for assistance with management. Per ER assessment she appeared dry on admission.  Foley was ordered in the ER.  Note that on chart review she was recently diagnosed with covid 19 PNA and is on day 4 of treatment with paxlovid.  No urine output is charted.  Repeat labs were ordered on consult and were just obtained - in process.   Note that she states that she has had diarrhea for a few years after mission trips [place] of the country; it has been worse over the past week.  She has had nausea but cannot vomit 2/2 Nissen fundoplication.  She states that she felt dizzy prior to presentation.  She denies any NSAID use; just takes tylenol.   Creatinine  Date/Time Value Ref Range Status  07/17/2022 10:43 AM 0.90 0.44 - 1.00 mg/dL Final   Creatinine, Ser  Date/Time Value Ref Range Status  09/13/2022 01:30 PM 6.62 (H) 0.44 - 1.00 mg/dL Final  16/10/9602 54:09 AM 0.75 0.44 - 1.00 mg/dL Final  81/19/1478 29:56 AM 0.76 0.44 - 1.00 mg/dL Final  21/30/8657 84:69 AM 0.70 0.44 - 1.00 mg/dL Final  62/95/2841 32:44 AM 0.80 0.44 - 1.00 mg/dL Final  01/20/7251 66:44 AM 0.91 0.44 - 1.00 mg/dL Final  03/47/4259 56:38 AM 1.07 (H) 0.44 - 1.00 mg/dL Final  75/64/3329 51:88 PM 1.23 (H) 0.44 - 1.00 mg/dL Final  41/66/0630 16:01 PM 1.76 (H) 0.44 - 1.00 mg/dL Final  09/32/3557 32:20 AM 0.87 0.44 - 1.00 mg/dL Final  25/42/7062 37:62 AM 0.84 0.44 - 1.00 mg/dL Final  83/15/1761 60:73 AM 0.86 0.44 - 1.00 mg/dL Final  71/06/2692 85:46 AM  0.75 0.44 - 1.00 mg/dL Final  27/03/5007 38:18 AM 0.81 0.44 - 1.00 mg/dL Final  29/93/7169 67:89 AM 0.81 0.44 - 1.00 mg/dL Final  38/10/1749 02:58 AM 0.83 0.44 - 1.00 mg/dL Final  52/77/8242 35:36 AM 0.95 0.44 - 1.00 mg/dL Final  14/43/1540 08:67 AM 1.16 (H) 0.44 - 1.00 mg/dL Final  61/95/0932 67:12 PM 2.04 (H) 0.44 - 1.00 mg/dL Final  45/80/9983 38:25 AM 1.37 (H) 0.44 - 1.00 mg/dL Final  05/39/7673 41:93 PM 0.94 0.44 - 1.00 mg/dL Final  79/02/4095 35:32 PM 0.83 0.44 - 1.00 mg/dL Final  99/24/2683 41:96 PM 1.34 (H) 0.44 - 1.00 mg/dL Final  22/29/7989 21:19 PM 0.66 0.44 - 1.00 mg/dL Final  41/74/0814 48:18 PM 0.69 0.44 - 1.00 mg/dL Final  56/31/4970 26:37 AM 0.61 0.44 - 1.00 mg/dL Final  85/88/5027 74:12 AM 0.58 0.44 - 1.00 mg/dL Final  87/86/7672 09:47 AM 0.83 0.44 - 1.00 mg/dL Final  09/62/8366 29:47 PM 0.81 0.44 - 1.00 mg/dL Final     PMHx:   Past Medical History:  Diagnosis Date   Anxiety    Chronic diarrhea    Coarse tremors    Colon polyp    DDD (degenerative disc disease), cervical    Hiatal hernia    History of skin cancer    Hypercholesteremia    Hypertension  Migraines    Osteoporosis    Shortness of breath dyspnea    with exertion   Tremor    Weight loss     Past Surgical History:  Procedure Laterality Date   BIOPSY  07/10/2022   Procedure: BIOPSY;  Surgeon: Charlott Rakes, MD;  Location: [facility];  Service: Gastroenterology;;   ESOPHAGEAL MANOMETRY N/A 01/15/2015   Procedure: ESOPHAGEAL MANOMETRY (EM);  Surgeon: Graylin Shiver, MD;  Location: [facility];  Service: Endoscopy;  Laterality: N/A;   ESOPHAGOGASTRODUODENOSCOPY (EGD) WITH PROPOFOL Left 07/10/2022   Procedure: ESOPHAGOGASTRODUODENOSCOPY (EGD) WITH PROPOFOL;  Surgeon: Charlott Rakes, MD;  Location: [facility];  Service: Gastroenterology;  Laterality: Left;   HIATAL HERNIA REPAIR     KYPHOPLASTY N/A 12/22/2017   Procedure: LUMBAR TWO KYPHOPLASTY;  Surgeon: Estill Bamberg, MD;  Location:  [facility] OR;  Service: Orthopedics;  Laterality: N/A;  LUMBAR TWO KYPHOPLASTY   RHINOPLASTY     SINUS SURGERY WITH INSTATRAK      Family Hx:  Family History  Problem Relation Age of Onset   Lung cancer Mother    COPD Father    Tremor Father    Hypertension Brother    Tremor Brother     Social History:  reports that she quit smoking about 34 years ago. Her smoking use included cigarettes. She has never used smokeless tobacco. She reports current alcohol use. She reports that she does not use drugs.  Allergies:  Allergies  Allergen Reactions   Macrodantin [Nitrofurantoin Macrocrystal] Shortness Of Breath, Itching, Nausea And Vomiting, Swelling and Rash    Tongue swelling   Topamax [Topiramate] Other (See Comments)    Loss of taste, fatigue!!!!!!   Diclofenac Other (See Comments)    Stomach burning   Tramadol Other (See Comments)    Stomach burning   Rice Nausea And Vomiting   Rosemary Oil Nausea And Vomiting   Codeine Nausea And Vomiting    Medications: Prior to Admission medications   Medication Sig Start Date End Date Taking? Authorizing Provider  atorvastatin (LIPITOR) 20 MG tablet Take 20 mg by mouth daily.   Yes [provider]  budesonide (ENTOCORT EC) 3 MG 24 hr capsule Take 9 mg by mouth in the morning.   Yes [provider]  DULoxetine (CYMBALTA) 20 MG capsule Take 20 mg by mouth daily.   Yes [provider]  pantoprazole (PROTONIX) 40 MG tablet Take 1 tablet (40 mg total) by mouth daily. Follow up with gastroenterology 07/15/22 09/13/22 Yes Zigmund Daniel., MD  primidone (MYSOLINE) 50 MG tablet Take 2 tablets (100 mg total) by mouth 3 (three) times daily. Must keep follow up 07/07/2020 for ongoing refills Patient taking differently: Take 100 mg by mouth in the morning, at noon, and at bedtime. 06/04/20  Yes Butch Penny, NP  ALPRAZolam (XANAX) 0.25 MG tablet Take 1 tablet (0.25 mg total) by mouth daily as needed (tremors). 08/23/19   Narda Bonds, MD  Apoaequorin (PREVAGEN PO) Take 1 capsule by mouth daily.    [provider]  loperamide (IMODIUM A-D) 2 MG tablet Take 4-6 mg by mouth See admin instructions. Take 6 mg by mouth in the morning and an additional 4 mg with each subsequent loose stool    [provider]  Multiple Vitamins-Minerals (PRESERVISION AREDS 2) CAPS Take 1 capsule by mouth daily.    [provider]  Nutritional Supplements (,FEEDING SUPPLEMENT, PROSOURCE PLUS) liquid Take 30 mLs by mouth 3 (three) times daily between meals. Patient  not taking: Reported on 07/08/2022 08/23/19   Narda Bonds, MD  promethazine (PHENERGAN) 25 MG tablet Take 25 mg by mouth 5 (five) times daily as needed for nausea or vomiting.  08/10/19   [provider]  Vitamin D, Ergocalciferol, (DRISDOL) 1.25 MG (50000 UNIT) CAPS capsule Take 50,000 Units by mouth every Monday.    [provider]   I have reviewed the patient's current and reported prior to admission medications.  Labs:     Latest Ref Rng & Units 09/13/2022    1:30 PM 07/17/2022   10:43 AM 07/14/2022    4:26 AM  BMP  Glucose 70 - 99 mg/dL 160  109  98   BUN 8 - 23 mg/dL 74  10  <5   Creatinine 0.44 - 1.00 mg/dL 3.23  5.57  3.22   Sodium 135 - 145 mmol/L 123  140  137   Potassium 3.5 - 5.1 mmol/L 2.1  3.2  3.2   Chloride 98 - 111 mmol/L 89  103  109   CO2 22 - 32 mmol/L 13  25  22    Calcium 8.9 - 10.3 mg/dL 7.3  7.4  6.7     Urinalysis    Component Value Date/Time   COLORURINE YELLOW 07/08/2022 1252   APPEARANCEUR CLEAR 07/08/2022 1252   LABSPEC 1.013 07/08/2022 1252   PHURINE 6.0 07/08/2022 1252   GLUCOSEU NEGATIVE 07/08/2022 1252   HGBUR NEGATIVE 07/08/2022 1252   BILIRUBINUR NEGATIVE 07/08/2022 1252   KETONESUR NEGATIVE 07/08/2022 1252   PROTEINUR 30 (A) 07/08/2022 1252   NITRITE NEGATIVE 07/08/2022 1252   LEUKOCYTESUR NEGATIVE 07/08/2022 1252     ROS:  Pertinent items noted in HPI and remainder of  comprehensive ROS otherwise negative.  Physical Exam: Vitals:   09/13/22 1626 09/13/22 1742  BP:  129/80  Pulse:  93  Resp:  20  Temp: (!) 97.4 F (36.3 C) 97.8 F (36.6 C)  SpO2:  100%     General:  elderly female in bed in NAD   HEENT: NCAT Eyes: EOMI sclera anicteric Neck: supple trachea midline  Heart: S1S2 no rub; tachy Lungs: unlabored and no audible wheezing; on room air  Abdomen: soft/nt/nd Extremities: no edema; no cyanosis or clubbing  Skin: no rash on extremities exposed Neuro: tremor noted GU foley noted with dark urine   Assessment/Plan:  AKI  - Felt pre-renal.  Patient with covid and severe n/d.  She has small kidneys with normal echogenicity on renal US  - s/p bolus of 1.5 liters in the ER and now on LR - Continue LR with concurrent hypokalemia.  Increase to 100 ml/hr.  She has no current indication for renal replacement therapy  - await updated BMP - foley catheter placed in ER  - would reduce dose of primidone to every 24 hours for now - UA is pending  - Check up/cr ratio - continue supportive care for n/d. Note hx of Nissen fundoplication 03/2015  Hypokalemia - s/p 40 meq potassium PO as well as IV potassium 10 meq in the ER - continue LR - await updated BMP - check mag   Hyponatremia  - await updated BMP  Metabolic acidosis - Secondary to AKI as well as GI losses   Covid 19 PNA  - Per primary team   Thank you for the consult.  Please do not hesitate to contact me with any questions regarding our patient    Estanislado Emms 09/13/2022, 8:13 PM

## 2022-09-13 NOTE — ED Provider Notes (Signed)
[place] EMERGENCY DEPARTMENT AT [facility] Provider Note   CSN: [id number] Arrival date & time: 09/13/22  1151     History  Chief Complaint  Patient presents with   Abdominal Pain    Brenda [name] is a 82 y.o. female with medical history of anxiety, chronic diarrhea, tremors, DDD, hypertension, abdominal pain, dyspnea on exertion.  Patient presents to ED for evaluation.  Patient reports that on Friday she was diagnosed with COVID-19.  She states that she has been taking Paxlovid since this time.  She reports that she has been having nausea, vomiting, diarrhea, abdominal pain at home.  She is also endorsing shortness of breath.  She denies chest pain, dysuria, one-sided weakness or numbness.  She arrives on oxygen, she denies using oxygen at home.  She denies lightheadedness, dizziness.  She is endorsing weakness and states that for the last 3 weeks she has been bedbound.  She reports that she lives in an assisted living facility.  She typically ambulates under her own power.  She lives with her husband.   Abdominal Pain Associated symptoms: diarrhea, nausea, shortness of breath and vomiting   Associated symptoms: no chest pain and no dysuria        Home Medications Prior to Admission medications   Medication Sig Start Date End Date Taking? Authorizing Provider  ALPRAZolam (XANAX) 0.25 MG tablet Take 1 tablet (0.25 mg total) by mouth daily as needed (tremors). 08/23/19   Narda Bonds, MD  Apoaequorin (PREVAGEN PO) Take 1 capsule by mouth daily.    [provider]  atorvastatin (LIPITOR) 20 MG tablet Take 20 mg by mouth daily.    [provider]  budesonide (ENTOCORT EC) 3 MG 24 hr capsule Take 9 mg by mouth in the morning.    [provider]  DULoxetine (CYMBALTA) 20 MG capsule Take 20 mg by mouth daily.    [provider]  loperamide (IMODIUM A-D) 2 MG tablet Take 4-6 mg by mouth See admin instructions. Take 6 mg by mouth in the  morning and an additional 4 mg with each subsequent loose stool    [provider]  Multiple Vitamins-Minerals (PRESERVISION AREDS 2) CAPS Take 1 capsule by mouth daily.    [provider]  Nutritional Supplements (,FEEDING SUPPLEMENT, PROSOURCE PLUS) liquid Take 30 mLs by mouth 3 (three) times daily between meals. Patient not taking: Reported on 07/08/2022 08/23/19   Narda Bonds, MD  pantoprazole (PROTONIX) 40 MG tablet Take 1 tablet (40 mg total) by mouth daily. Follow up with gastroenterology 07/15/22 09/13/22  Zigmund Daniel., MD  primidone (MYSOLINE) 50 MG tablet Take 2 tablets (100 mg total) by mouth 3 (three) times daily. Must keep follow up 07/07/2020 for ongoing refills Patient taking differently: Take 100 mg by mouth in the morning, at noon, and at bedtime. 06/04/20   Butch Penny, NP  promethazine (PHENERGAN) 25 MG tablet Take 25 mg by mouth 5 (five) times daily as needed for nausea or vomiting.  08/10/19   [provider]  Vitamin D, Ergocalciferol, (DRISDOL) 1.25 MG (50000 UNIT) CAPS capsule Take 50,000 Units by mouth every Monday.    [provider]      Allergies    Macrodantin [nitrofurantoin macrocrystal], Topamax [topiramate], Diclofenac, Tramadol, Rice, Rosemary oil, and Codeine    Review of Systems   Review of Systems  Respiratory:  Positive for shortness of breath.   Cardiovascular:  Negative for chest pain.  Gastrointestinal:  Positive for abdominal pain, diarrhea, nausea and vomiting.  Genitourinary:  Negative for dysuria.  Neurological:  Positive for weakness. Negative for numbness.  All other systems reviewed and are negative.   Physical Exam Updated Vital Signs BP 127/80   Pulse 87   Temp (!) 97.5 F (36.4 C) (Oral)   Resp 16   Ht 5\' 4"  (1.626 m)   Wt 46 kg   SpO2 100%   BMI 17.41 kg/m  Physical Exam Vitals and nursing note reviewed.  Constitutional:      General: She is not in acute distress.    Appearance:  Normal appearance. She is not ill-appearing, toxic-appearing or diaphoretic.  HENT:     Head: Normocephalic and atraumatic.     Mouth/Throat:     Mouth: Mucous membranes are moist.     Pharynx: Oropharynx is clear.  Eyes:     Extraocular Movements: Extraocular movements intact.     Conjunctiva/sclera: Conjunctivae normal.     Pupils: Pupils are equal, round, and reactive to light.  Cardiovascular:     Rate and Rhythm: Normal rate and regular rhythm.  Pulmonary:     Effort: Pulmonary effort is normal.     Breath sounds: Normal breath sounds. No wheezing.  Abdominal:     General: Abdomen is flat. Bowel sounds are normal.     Palpations: Abdomen is soft.     Tenderness: There is no abdominal tenderness.  Musculoskeletal:     Cervical back: Normal range of motion and neck supple. No tenderness.  Skin:    General: Skin is warm and dry.     Capillary Refill: Capillary refill takes less than 2 seconds.  Neurological:     Mental Status: She is alert and oriented to person, place, and time.     GCS: GCS eye subscore is 4. GCS verbal subscore is 5. GCS motor subscore is 6.     Cranial Nerves: Cranial nerves 2-12 are intact.     Sensory: Sensation is intact. No sensory deficit.     Motor: Motor function is intact. No weakness.     Comments: 5 out of 5 strength bilateral lower extremities     ED Results / Procedures / Treatments   Labs (all labs ordered are listed, but only abnormal results are displayed) Labs Reviewed  CBC - Abnormal; Notable for the following components:      Result Value   WBC 19.0 (*)    RBC 3.75 (*)    HCT 35.3 (*)    All other components within normal limits  COMPREHENSIVE METABOLIC PANEL - Abnormal; Notable for the following components:   Sodium 123 (*)    Potassium 2.1 (*)    Chloride 89 (*)    CO2 13 (*)    Glucose, Bld 131 (*)    BUN 74 (*)    Creatinine, Ser 6.62 (*)    Calcium 7.3 (*)    Albumin 2.9 (*)    GFR, Estimated 6 (*)    Anion gap >20  (*)    All other components within normal limits  LIPASE, BLOOD - Abnormal; Notable for the following components:   Lipase 118 (*)    All other components within normal limits  CULTURE, BLOOD (ROUTINE X 2)  CULTURE, BLOOD (ROUTINE X 2)  URINALYSIS, ROUTINE W REFLEX MICROSCOPIC  MAGNESIUM  BASIC METABOLIC PANEL  I-STAT CG4 LACTIC ACID, ED  I-STAT CG4 LACTIC ACID, ED    EKG EKG Interpretation Date/Time:  Monday September 13 2022  13:28:52 EDT Ventricular Rate:  94 PR Interval:  132 QRS Duration:  115 QT Interval:  399 QTC Calculation: 418 R Axis:   161  Text Interpretation: Sinus rhythm Multiform ventricular premature complexes Nonspecific intraventricular conduction delay Inferior infarct, old Abnrm T, consider ischemia, anterolateral lds Borderline ST elevation, lateral leads Confirmed by Gloris Manchester ([id number]) on 09/13/2022 2:00:25 PM  Radiology DG Chest Portable 1 View  Result Date: 09/13/2022 CLINICAL DATA:  COVID diagnosis with abdominal pain, nausea, vomiting, and diarrhea EXAM: PORTABLE CHEST 1 VIEW COMPARISON:  Chest radiograph dated 07/08/2022 FINDINGS: Low lung volumes. No focal consolidations. No pleural effusion or pneumothorax. The heart size and mediastinal contours are within normal limits. No acute osseous abnormality. IMPRESSION: Low lung volumes without focal consolidation. Electronically Signed   By: Agustin Cree M.D.   On: 09/13/2022 14:05    Procedures .Critical Care  Performed by: Al Decant, PA-C Authorized by: Al Decant, PA-C   Critical care provider statement:    Critical care time was exclusive of:  Separately billable procedures and treating other patients and teaching time   Critical care was necessary to treat or prevent imminent or life-threatening deterioration of the following conditions:  Dehydration, renal failure and respiratory failure   Critical care was time spent personally by me on the following activities:  Blood draw for  specimens, development of treatment plan with patient or surrogate, discussions with consultants, discussions with primary provider, evaluation of patient's response to treatment, examination of patient, interpretation of cardiac output measurements, obtaining history from patient or surrogate, re-evaluation of patient's condition, pulse oximetry, review of old charts, ordering and review of radiographic studies, ordering and review of laboratory studies and ordering and performing treatments and interventions    Medications Ordered in ED Medications  ondansetron (ZOFRAN) injection 4 mg (4 mg Intravenous Not Given 09/13/22 1410)  potassium chloride SA (KLOR-CON M) CR tablet 40 mEq (has no administration in time range)  lactated ringers infusion (has no administration in time range)  sodium chloride 0.9 % bolus 500 mL (500 mLs Intravenous New Bag/Given 09/13/22 1409)    ED Course/ Medical Decision Making/ A&P Clinical Course as of 09/13/22 1531  Mon Sep 13, 2022  1442 DG Chest Portable 1 View [CG]  1516 Change fluid to LR. Insert foley, should improve.  [CG]  1519 Dr. Malen Gauze [CG]    Clinical Course User Index [CG] Al Decant, PA-C   Medical Decision Making Amount and/or Complexity of Data Reviewed Labs: ordered. Radiology: ordered. Decision-making details documented in ED Course.  Risk Prescription drug management. Decision regarding hospitalization.   82 year old female presents to the ED for evaluation.  Please see HPI for further details.  On examination patient is afebrile tachycardic.  Her lung sounds are clear bilaterally but she is hypoxic on room air.  She arrives with an oxygen saturation of 94% resting in bed.  Abdomen is soft and compressible throughout but she does endorse nonfocal generalized abdominal tenderness.  Her neurological examination reveals 5 out of 5 strength bilateral lower extremities, she is alert and oriented x 4.  Patient initially given 500 mL  fluid.  Patient received 500 mL en route with EMS.  Patient also given 4 mg Zofran for nausea.  Patient CBC shows a white count of 19, hemoglobin 12.7.  Her metabolic panel shows sodium 123, potassium 2.1, bicarb 13, BUN 74, anion gap greater than 20, creatinine 6.62.  1 month ago the patient creatinine was 0.90.  Patient is in  acute renal failure.  Patient lipase is elevated to 118.  Patient lactic acid not elevated at 1.5 however this was after receiving 500 mL of fluid with EMS.  At this time I placed consult to nephrology for acute renal failure.  Have started patient on 10 mEq of IV potassium x 6.  Patient will also receive CT scan of chest, abdomen, pelvis without contrast.  Also have ordered on renal ultrasound.  Have consulted nephrology at this time.  Patient most likely will need admission to critical care unit due to potassium.  Patient discussed with nephrology, Dr. Malen Gauze, who has agreed to consult on the patient.  Dr. Malen Gauze has requested that we insert a Foley catheter, change her to an LR infusion.  Will discontinue IV potassium and add on oral potassium 40 mEq.  Will also add on magnesium.  Patient was signed out to oncoming provider Christian Prosperi PA-C.   Final Clinical Impression(s) / ED Diagnoses Final diagnoses:  Generalized abdominal pain  Nausea vomiting and diarrhea    Rx / DC Orders ED Discharge Orders     None         Clent Ridges 09/13/22 1531    Gloris Manchester, MD 09/14/22 1704

## 2022-09-13 NOTE — ED Triage Notes (Signed)
Pt BIB EMS from [facility] c/o abd pain, n/v/d. Pt testes positive for covid and is on day 4 of Paxlovid

## 2022-09-14 DIAGNOSIS — N179 Acute kidney failure, unspecified: Secondary | ICD-10-CM | POA: Diagnosis not present

## 2022-09-14 LAB — BASIC METABOLIC PANEL
Anion gap: 14 (ref 5–15)
Anion gap: 12 (ref 5–15)
Anion gap: 11 (ref 5–15)
BUN: 61 mg/dL — ABNORMAL HIGH (ref 8–23)
BUN: 61 mg/dL — ABNORMAL HIGH (ref 8–23)
BUN: 51 mg/dL — ABNORMAL HIGH (ref 8–23)
CO2: 9 mmol/L — ABNORMAL LOW (ref 22–32)
CO2: 11 mmol/L — ABNORMAL LOW (ref 22–32)
CO2: 9 mmol/L — ABNORMAL LOW (ref 22–32)
Calcium: 6.5 mg/dL — ABNORMAL LOW (ref 8.9–10.3)
Calcium: 6.9 mg/dL — ABNORMAL LOW (ref 8.9–10.3)
Calcium: 6.6 mg/dL — ABNORMAL LOW (ref 8.9–10.3)
Chloride: 102 mmol/L (ref 98–111)
Chloride: 106 mmol/L (ref 98–111)
Chloride: 109 mmol/L (ref 98–111)
Creatinine, Ser: 3.86 mg/dL — ABNORMAL HIGH (ref 0.44–1.00)
Creatinine, Ser: 3.33 mg/dL — ABNORMAL HIGH (ref 0.44–1.00)
Creatinine, Ser: 2.26 mg/dL — ABNORMAL HIGH (ref 0.44–1.00)
GFR, Estimated: 11 mL/min — ABNORMAL LOW (ref >60)
GFR, Estimated: 13 mL/min — ABNORMAL LOW (ref >60)
GFR, Estimated: 21 mL/min — ABNORMAL LOW (ref >60)
Glucose, Bld: 71 mg/dL (ref 70–99)
Glucose, Bld: 119 mg/dL — ABNORMAL HIGH (ref 70–99)
Glucose, Bld: 139 mg/dL — ABNORMAL HIGH (ref 70–99)
Potassium: 4.7 mmol/L (ref 3.5–5.1)
Potassium: 2.9 mmol/L — ABNORMAL LOW (ref 3.5–5.1)
Potassium: 4.2 mmol/L (ref 3.5–5.1)
Sodium: 125 mmol/L — ABNORMAL LOW (ref 135–145)
Sodium: 129 mmol/L — ABNORMAL LOW (ref 135–145)
Sodium: 129 mmol/L — ABNORMAL LOW (ref 135–145)

## 2022-09-14 LAB — COMPREHENSIVE METABOLIC PANEL
ALT: 22 U/L (ref 0–44)
AST: 27 U/L (ref 15–41)
Albumin: 2.6 g/dL — ABNORMAL LOW (ref 3.5–5.0)
Alkaline Phosphatase: 108 U/L (ref 38–126)
Anion gap: 17 — ABNORMAL HIGH (ref 5–15)
BUN: 69 mg/dL — ABNORMAL HIGH (ref 8–23)
CO2: 10 mmol/L — ABNORMAL LOW (ref 22–32)
Calcium: 6.9 mg/dL — ABNORMAL LOW (ref 8.9–10.3)
Chloride: 98 mmol/L (ref 98–111)
Creatinine, Ser: 4.91 mg/dL — ABNORMAL HIGH (ref 0.44–1.00)
GFR, Estimated: 8 mL/min — ABNORMAL LOW (ref >60)
Glucose, Bld: 80 mg/dL (ref 70–99)
Potassium: 2.5 mmol/L — CL (ref 3.5–5.1)
Sodium: 125 mmol/L — ABNORMAL LOW (ref 135–145)
Total Bilirubin: 0.9 mg/dL (ref 0.3–1.2)
Total Protein: 6 g/dL — ABNORMAL LOW (ref 6.5–8.1)

## 2022-09-14 LAB — CBC
HCT: 34.6 % — ABNORMAL LOW (ref 36.0–46.0)
Hemoglobin: 12.2 g/dL (ref 12.0–15.0)
MCH: 33.6 pg (ref 26.0–34.0)
MCHC: 35.3 g/dL (ref 30.0–36.0)
MCV: 95.3 fL (ref 80.0–100.0)
Platelets: 207 10*3/uL (ref 150–400)
RBC: 3.63 MIL/uL — ABNORMAL LOW (ref 3.87–5.11)
RDW: 13.8 % (ref 11.5–15.5)
WBC: 14.5 10*3/uL — ABNORMAL HIGH (ref 4.0–10.5)
nRBC: 0 % (ref 0.0–0.2)

## 2022-09-14 MED ORDER — CHOLESTYRAMINE LIGHT 4 G PO PACK
4.0000 g | PACK | ORAL | Status: AC
  Administered 2022-09-14 – 2022-09-15 (×5): 4 g via ORAL
  Filled 2022-09-14 (×6): qty 1

## 2022-09-14 MED ORDER — SODIUM BICARBONATE 8.4 % IV SOLN
INTRAVENOUS | Status: DC
  Filled 2022-09-14: qty 1000
  Filled 2022-09-14: qty 150
  Filled 2022-09-14: qty 1000

## 2022-09-14 MED ORDER — SODIUM BICARBONATE 650 MG PO TABS
1300.0000 mg | ORAL_TABLET | Freq: Two times a day (BID) | ORAL | Status: DC
  Administered 2022-09-14 – 2022-09-15 (×4): 1300 mg via ORAL
  Filled 2022-09-14 (×4): qty 2

## 2022-09-14 MED ORDER — POTASSIUM CHLORIDE CRYS ER 20 MEQ PO TBCR
40.0000 meq | EXTENDED_RELEASE_TABLET | Freq: Once | ORAL | Status: AC
  Administered 2022-09-14: 40 meq via ORAL
  Filled 2022-09-14: qty 2

## 2022-09-14 MED ORDER — LOPERAMIDE HCL 2 MG PO CAPS
4.0000 mg | ORAL_CAPSULE | Freq: Four times a day (QID) | ORAL | Status: AC
  Administered 2022-09-14 – 2022-09-15 (×3): 4 mg via ORAL
  Filled 2022-09-14 (×3): qty 2

## 2022-09-14 MED ORDER — POTASSIUM CHLORIDE 10 MEQ/100ML IV SOLN
10.0000 meq | Freq: Once | INTRAVENOUS | Status: DC
  Filled 2022-09-14: qty 100

## 2022-09-14 MED ORDER — POTASSIUM CHLORIDE 10 MEQ/100ML IV SOLN
10.0000 meq | INTRAVENOUS | Status: AC
  Administered 2022-09-14 (×4): 10 meq via INTRAVENOUS
  Filled 2022-09-14 (×4): qty 100

## 2022-09-14 MED ORDER — CHLORHEXIDINE GLUCONATE CLOTH 2 % EX PADS
6.0000 | MEDICATED_PAD | Freq: Every day | CUTANEOUS | Status: DC
  Administered 2022-09-14 – 2022-09-17 (×3): 6 via TOPICAL

## 2022-09-14 MED ORDER — POTASSIUM CHLORIDE 10 MEQ/100ML IV SOLN
10.0000 meq | INTRAVENOUS | Status: AC
  Administered 2022-09-14 (×4): 10 meq via INTRAVENOUS
  Filled 2022-09-14 (×3): qty 100

## 2022-09-14 NOTE — Progress Notes (Signed)
PROGRESS NOTE  [name]  DOB: Feb 09, 1940  PCP: Clovis Riley, L.August Saucer, MD FAO:[id number]  DOA: 09/13/2022  LOS: 1 day  Hospital Day: 2  Brief narrative: Brenda [name] is a 82 y.o. female with PMH significant for HTN, HLD, chronic diarrhea, anxiety, migraine, hiatal hernia, coarse tremor, chronic diarrhea, weight loss who is a long-term resident at [facility] independent facility 4 days prior, patient tested positive for COVID at the facility and was started on Paxlovid. 8/26, patient was sent to the ED with complaint of abdominal pain, nausea, vomiting and diarrhea.  Since the diagnosis of COVID, patient has had poor appetite, episodes of lightheadedness and poor urine output.  In the ED, patient was afebrile, hemodynamically stable, breathing room air Initial labs with WC count 19,000, sodium 123, potassium 2.1, bicarb low at 13, BUN/creatinine elevated to 74/6.62, anion gap more than 20, lipase elevated to 118, lactic acid level normal Urinalysis clear yellow urine with small amount of hemoglobin, rare bacteria Blood culture sent Chest x-ray unremarkable  Renal ultrasound  unremarkable. CT chest abdomen pelvis showed gas fluid levels throughout the colon to the level of the rectum, consistent with history of diarrhea. No bowel obstruction or ileus.  Patient was given IV fluid, potassium replacement Admitted to [facility] Nephrologist consulted  Subjective: Patient was seen and examined this morning. Pleasant elderly Caucasian female.  Lying down in bed.  Not in distress.  Husband at bedside. Per RN, patient had 2 episodes of loose watery bowel movement this morning Remains hemodynamically stable Most recent labs from this morning with sodium 125, potassium 4.7, creatinine down to 3.86  Assessment and plan: Severe hypokalemia Potassium level was significantly low.  Trend as below.  Aggressively replaced.  Improved to 4.7. Recent Labs  Lab 09/13/22 1330 09/13/22 1907 09/14/22 0019  09/14/22 0701 09/14/22 0858  K 2.1* <2.0* 2.5* 4.7 2.9*  MG  --  2.0  --   --   --    AKI (acute kidney injury) Acute anion gap metabolic acidosis Prerenal secondary to vomiting and poor intake Nephrology consult appreciated. Gradually improving creatinine with IV fluid. Blood glucose level normal, anion gap normalized, urine ketone level normal. However, serum bicarb level is worsening.  Low at 9 this morning. Defer to nephrology for the choice of fluid.  Recent Labs    07/11/22 0357 07/12/22 0358 07/13/22 0414 07/14/22 0426 07/17/22 1043 09/13/22 1330 09/13/22 1907 09/14/22 0019 09/14/22 0701 09/14/22 0858  BUN 6* <5* <5* <5* 10 74* 70* 69* 61* 61*  CREATININE 0.80 0.70 0.76 0.75 0.90 6.62* 5.66* 4.91* 3.86* 3.33*  CO2 19* 18* 17* 22 25 13* 10* 10* 9* 11*   COVID-19 virus infection  Emphysema Received 4 days of Paxlovid.  Will hold for now given poor renal function Currently breathing on room air Bronchodilators as needed  Acute worsening of chronic diarrhea  Patient states that she has been struggling with diarrhea for the last several months after a trip to [place].  In the past, she was seen by GI Dr. Dulce Sellar, and tried on Imodium with partial relief.  She was maintained on budesonide 3 mg daily. Since the diagnosis of COVID-19 infection, diarrhea has worsened. CT abdomen with fluid-filled colon consistent with diarrhea I will order for scheduled Imodium as well as Questran Offered Flexi-Seal.  Essential hypertension PTA on Lasix 20 mg daily, lisinopril 10 mg daily Currently on hold due to AKI.  Continue to monitor blood pressure.   Aortic Atherosclerosis HLD Continue Lipitor  GERD s/p Nissen fundoplication - 2017 Continue pantoprazole 40 mg p.o. daily.   Pressure injury of skin Continue local care.   Essential tremor PTA on primidone 100 mg p.o. twice daily, Dose reduced due to AKI.  Anxiety/depression PTA on Cymbalta 20 mg daily, Xanax 0.25 mg  daily as needed  Cholelithiasis  without evidence of acute cholecystitis  Chronic vertebral fractures  CT scan showed chronic appearing mild anterior wedge compression deformities at T1 and T3. Chronic L2 wedge compression deformity with prior vertebral augmentation. Pain management as needed.   Mobility: Encourage ambulation.  PT eval  Goals of care   Code Status: Full Code     DVT prophylaxis:  heparin injection 5,000 Units Start: 09/13/22 1545   Antimicrobials: None Fluid: LR at 100 mL/h currently Consultants: Nephrology Family Communication: Husband at bedside  Status: Inpatient Level of care:  Progressive   Patient is from: [facility] independent living facility Needs to continue in-hospital care: Diarrhea continues, creatinine still remains elevated.  Needs more IV fluid Anticipated d/c to: Hopefully back to the ILF.  Pending PT eval      Diet:  Diet Order             Diet Heart Room service appropriate? Yes; Fluid consistency: Thin  Diet effective now                   Scheduled Meds:  budesonide  9 mg Oral Daily   Chlorhexidine Gluconate Cloth  6 each Topical Daily   cholestyramine light  4 g Oral 3 times per day   DULoxetine  20 mg Oral Daily   feeding supplement  237 mL Oral BID BM   heparin  5,000 Units Subcutaneous Q8H   loperamide  4 mg Oral Q6H   ondansetron (ZOFRAN) IV  4 mg Intravenous Once   pantoprazole  40 mg Oral Daily   primidone  100 mg Oral Daily   sodium bicarbonate  1,300 mg Oral BID    PRN meds: acetaminophen **OR** acetaminophen, ondansetron **OR** ondansetron (ZOFRAN) IV, mouth rinse   Infusions:   lactated ringers 100 mL/hr at 09/14/22 0636   potassium chloride Stopped (09/14/22 1015)   potassium chloride 10 mEq (09/14/22 1337)    Antimicrobials: Anti-infectives (From admission, onward)    Start     Dose/Rate Route Frequency Ordered Stop   09/13/22 2200  nirmatrelvir/ritonavir (renal dosing) (PAXLOVID) 2 tablet   Status:  Discontinued       Note to Pharmacy: Please adjust or discontinue depending on GFR. TY.   2 tablet Oral 2 times daily 09/13/22 1753 09/13/22 1805       Nutritional status:  Body mass index is 17.41 kg/m.          Objective: Vitals:   09/14/22 0201 09/14/22 0639  BP: 112/62 (!) 142/52  Pulse: (!) 101 99  Resp: 18 20  Temp: 98.4 F (36.9 C) 98.2 F (36.8 C)  SpO2: 95% 100%    Intake/Output Summary (Last 24 hours) at 09/14/2022 1408 Last data filed at 09/14/2022 0900 Gross per 24 hour  Intake 548.93 ml  Output 1550 ml  Net -1001.07 ml   Filed Weights   09/13/22 1205  Weight: 46 kg   Weight change:  Body mass index is 17.41 kg/m.   Physical Exam: General exam: Pleasant, elderly Caucasian female.  Not in distress Skin: No rashes, lesions or ulcers. HEENT: Atraumatic, normocephalic, no obvious bleeding Lungs: Clear to auscultation bilaterally CVS: Regular rate and rhythm, no  murmur GI/Abd soft, nontender, nondistended, bowel sound present CNS: Alert, awake, oriented x 3 Psychiatry: Mood appropriate Extremities: No pedal edema, no calf tenderness  Data Review: I have personally reviewed the laboratory data and studies available.  F/u labs ordered Unresulted Labs (From admission, onward)     Start     Ordered   09/15/22 0500  Basic metabolic panel  Daily,   R      09/13/22 1542   09/14/22 1500  Basic metabolic panel  Once,   R        09/14/22 1208   09/14/22 1300  Culture, blood (Routine X 2) w Reflex to ID Panel  Once,   R        09/14/22 1300   09/13/22 1434  Blood culture (routine x 2)  BLOOD CULTURE X 2,   R (with STAT occurrences)      09/13/22 1434   Unscheduled  CBC with Differential/Platelet  Daily,   R      09/14/22 1408            Total time spent in review of labs and imaging, patient evaluation, formulation of plan, documentation and communication with family: 55 minutes  Signed, Lorin Glass, MD [facility] 09/14/2022

## 2022-09-14 NOTE — TOC Initial Note (Signed)
Transition of Care [facility]) - Initial/Assessment Note    Patient Details  Name: Brenda [name] MRN: [id number] Date of Birth: Mar 05, 1940  [facility]) CM/SW Contact:    Larrie Kass, LCSW Phone Number: 09/14/2022, 11:20 AM  Clinical Narrative:                 Pt from [facility] independent living. TOC to follow for d/c needs.    Expected Discharge Plan:  (TBD) Barriers to Discharge: Continued Medical Work up   Patient Goals and CMS Choice            Expected Discharge Plan and Services       Living arrangements for the past 2 months: Independent Living Facility                                      Prior Living Arrangements/Services Living arrangements for the past 2 months: Independent Living Facility Lives with:: Spouse                   Activities of Daily Living Home Assistive Devices/Equipment: None ADL Screening (condition at time of admission) Patient's cognitive ability adequate to safely complete daily activities?: Yes Is the patient deaf or have difficulty hearing?: No Does the patient have difficulty seeing, even when wearing glasses/contacts?: No Does the patient have difficulty concentrating, remembering, or making decisions?: No Patient able to express need for assistance with ADLs?: Yes Does the patient have difficulty dressing or bathing?: Yes Independently performs ADLs?: No Communication: Dependent Is this a change from baseline?: Change from baseline, expected to last >3 days Dressing (OT): Needs assistance Is this a change from baseline?: Change from baseline, expected to last >3 days Grooming: Needs assistance Is this a change from baseline?: Change from baseline, expected to last >3 days Feeding: Needs assistance Is this a change from baseline?: Change from baseline, expected to last >3 days Bathing: Needs assistance Is this a change from baseline?: Change from baseline, expected to last >3 days Toileting:  Needs assistance Is this a change from baseline?: Change from baseline, expected to last >3days In/Out Bed: Needs assistance Is this a change from baseline?: Change from baseline, expected to last >3 days Walks in Home: Needs assistance Is this a change from baseline?: Change from baseline, expected to last >3 days Does the patient have difficulty walking or climbing stairs?: Yes Weakness of Legs: Both Weakness of Arms/Hands: Both  Permission Sought/Granted                  Emotional Assessment       Orientation: : Oriented to Self      Admission diagnosis:  Generalized abdominal pain [R10.84] AKI (acute kidney injury) (HCC) [N17.9] Nausea vomiting and diarrhea [R11.2, R19.7] Patient Active Problem List   Diagnosis Date Noted   Pressure injury of skin 09/13/2022   COVID-19 virus infection 09/13/2022   Hypokalemia 09/13/2022   Other pancytopenia (HCC) 07/17/2022   Biliary colic 07/10/2022   Abdominal pain 07/09/2022   Protein-calorie malnutrition, severe 08/15/2019   Dehydration    Diarrhea    Shock circulatory (HCC)    AKI (acute kidney injury) (HCC) 08/13/2019   Hiatal hernia with obstruction but no gangrene 08/13/2019   Nausea vomiting and diarrhea 08/13/2019   Volume depletion 08/13/2019   Hypotension 08/13/2019   Enteritis 08/13/2019   Volume depletion, gastrointestinal loss 08/13/2019   Essential  tremor 11/20/2015   Paraesophageal hiatal hernia s/p repair w biologic mesh 03/26/2015 03/27/2015   GERD (gastroesophageal reflux disease) s/p Nissen fundoplication 03/26/2015 03/26/2015   PCP:  Clovis Riley, L.August Saucer, MD Pharmacy:   [facility] [phone] - [place], [place] - [address] AT [address] OF [facility] [address] [place] [address] Phone: [phone] Fax: [phone]     Social Determinants of Health (SDOH) Social History: SDOH Screenings   Food Insecurity: No Food Insecurity (09/13/2022)  Housing: Low Risk  (09/13/2022)   Transportation Needs: No Transportation Needs (09/13/2022)  Utilities: Not At Risk (09/13/2022)  Tobacco Use: Medium Risk (09/13/2022)   SDOH Interventions:     Readmission Risk Interventions     No data to display

## 2022-09-14 NOTE — Plan of Care (Signed)
  Problem: Clinical Measurements: Goal: Will remain free from infection Outcome: Progressing Goal: Diagnostic test results will improve Outcome: Progressing   Problem: Nutrition: Goal: Adequate nutrition will be maintained Outcome: Progressing   Problem: Coping: Goal: Level of anxiety will decrease Outcome: Progressing   Problem: Skin Integrity: Goal: Risk for impaired skin integrity will decrease Outcome: Progressing   Problem: Nutritional: Goal: Ability to make appropriate dietary choices will improve Outcome: Progressing   Problem: Urinary Elimination: Goal: Progression of disease will be identified and treated Outcome: Progressing   Problem: Activity: Goal: Activity intolerance will improve Outcome: Not Progressing

## 2022-09-14 NOTE — Evaluation (Signed)
Physical Therapy Evaluation Patient Details Name: Brenda [name] MRN: [id number] DOB: Feb 11, 1940 Today's Date: 09/14/2022  History of Present Illness  82 yo female admitted with COVID, nausea, diarrhea. Hx of anxiety, chronic diarrhea, tremors, cervical DDD, skin ca, osteoporosis, migraines, vertebral fxs  Clinical Impression  On eval, pt was Min A for mobility. She walked ~3 feet with a RW. She was also able to step over to/from bsc with assistance. Pt is confused-no family present during session to provide PLOF/home info/baseline cognitive status. She continues to have diarrhea. Found pt asleep in room lying in stool. Total A for toileting hygiene on today. Will continue to follow and progress activity as pt is able to tolerate. Will recommend HHPT at this time, as long as family can provide current level of assistance and supervision.         If plan is discharge home, recommend the following: A little help with walking and/or transfers;A little help with bathing/dressing/bathroom;Assistance with cooking/housework;Assist for transportation;Help with stairs or ramp for entrance   Can travel by private vehicle        Equipment Recommendations Rolling walker (2 wheels)  Recommendations for Other Services  OT consult    Functional Status Assessment Patient has had a recent decline in their functional status and demonstrates the ability to make significant improvements in function in a reasonable and predictable amount of time.     Precautions / Restrictions Precautions Precautions: Fall Precaution Comments: diarrhea Restrictions Weight Bearing Restrictions: No      Mobility  Bed Mobility Overal bed mobility: Needs Assistance Bed Mobility: Sidelying to Sit, Sit to Supine   Sidelying to sit: Min assist   Sit to supine: Min assist   General bed mobility comments: Assist for trunk and LEs. Increased time. Cues required.    Transfers Overall transfer level: Needs  assistance Equipment used: Rolling walker (2 wheels) Transfers: Sit to/from Stand, Bed to chair/wheelchair/BSC Sit to Stand: Min assist   Step pivot transfers: Min assist       General transfer comment: Assist to rise, steady, control descent. Cues for safety, technique, hand placement.    Ambulation/Gait Ambulation/Gait assistance: Min assist Gait Distance (Feet): 3 Feet Assistive device: Rolling walker (2 wheels) Gait Pattern/deviations: Step-through pattern, Decreased stride length       General Gait Details: Pt took a few steps from recliner to [facility] with RW. Assist to steady and maneuver with RW. Cues for safety.  Stairs            Wheelchair Mobility     Tilt Bed    Modified Rankin (Stroke Patients Only)       Balance Overall balance assessment: Needs assistance         Standing balance support: Reliant on assistive device for balance, Bilateral upper extremity supported, During functional activity Standing balance-Leahy Scale: Poor                               Pertinent Vitals/Pain Pain Assessment Pain Assessment: No/denies pain    Home Living Family/patient expects to be discharged to:: Private residence Living Arrangements: Spouse/significant other Available Help at Discharge: Family Type of Home: Independent living facility Home Access: Level entry       Home Layout: One level   Additional Comments: unsure of PLOF-chart review indicates pt was ambulatory without a device-pt unreliable historian at time of eval 2* confusion    Prior Function  Mobility Comments: ambulatory       Extremity/Trunk Assessment   Upper Extremity Assessment Upper Extremity Assessment: Defer to OT evaluation    Lower Extremity Assessment Lower Extremity Assessment: Generalized weakness    Cervical / Trunk Assessment Cervical / Trunk Assessment: Kyphotic  Communication   Communication Communication: No apparent  difficulties Cueing Techniques: Verbal cues;Tactile cues  Cognition Arousal: Alert Behavior During Therapy: WFL for tasks assessed/performed Overall Cognitive Status: No family/caregiver present to determine baseline cognitive functioning Area of Impairment: Memory, Problem solving                     Memory: Decreased short-term memory       Problem Solving: Decreased initiation, Requires verbal cues          General Comments      Exercises     Assessment/Plan    PT Assessment Patient needs continued PT services  PT Problem List Decreased strength;Decreased activity tolerance;Decreased mobility;Decreased cognition;Decreased safety awareness;Decreased knowledge of use of DME       PT Treatment Interventions DME instruction;Gait training;Functional mobility training;Therapeutic activities;Therapeutic exercise;Balance training;Patient/family education    PT Goals (Current goals can be found in the Care Plan section)  Acute Rehab PT Goals Patient Stated Goal: none stated PT Goal Formulation: Patient unable to participate in goal setting Time For Goal Achievement: 09/28/22 Potential to Achieve Goals: Good    Frequency Min 1X/week     Co-evaluation               AM-PAC PT "6 Clicks" Mobility  Outcome Measure Help needed turning from your back to your side while in a flat bed without using bedrails?: A Little Help needed moving from lying on your back to sitting on the side of a flat bed without using bedrails?: A Little Help needed moving to and from a bed to a chair (including a wheelchair)?: A Little Help needed standing up from a chair using your arms (e.g., wheelchair or bedside chair)?: A Little Help needed to walk in hospital room?: A Little Help needed climbing 3-5 steps with a railing? : A Lot 6 Click Score: 17    End of Session   Activity Tolerance: Patient tolerated treatment well Patient left: in bed;with call bell/phone within reach;with  bed alarm set;with nursing/sitter in room   PT Visit Diagnosis: Muscle weakness (generalized) (M62.81);Difficulty in walking, not elsewhere classified (R26.2)    Time: 8657-8469 PT Time Calculation (min) (ACUTE ONLY): 20 min   Charges:   PT Evaluation $PT Eval Low Complexity: 1 Low   PT General Charges $$ ACUTE PT VISIT: 1 Visit            Faye Ramsay, PT Acute Rehabilitation  Office: [phone]

## 2022-09-14 NOTE — Progress Notes (Signed)
[facility] Progress Note  Name: Brenda [name] MRN: [id number] DOB: Mar 05, 1940  Chief Complaint:  Abdominal pain and diarrhea  Subjective:  she had 1.4 liters UOP over 8/26 and another 1 liter UOP charted today thus far.  Spoke with her husband at bedside.  She is feeling better today.  Glad to hear about her kidneys  Review of systems:  Chronic diarrhea; no emesis  She denies shortness of breath  No chest pain  Still with foley   -----------------  Background on consult:  Brenda [name] is a 82 y.o. female with a history of HTN, anxiety, migraine, and charted history of chronic diarrhea who presented for evaluation of abdominal pain, n/v and diarrhea.  She was found to have AKI with Cr 6.62.  She was also found to have marked hypokalemia.  Nephrology is consulted for assistance with management. Per ER assessment she appeared dry on admission.  Foley was ordered in the ER.  Note that on chart review she was recently diagnosed with covid 19 PNA and is on day 4 of treatment with paxlovid.  No urine output is charted.  Repeat labs were ordered on consult and were just obtained - in process.   Note that she states that she has had diarrhea for a few years after [place]; it has been worse over the past week.  She has had nausea but cannot vomit 2/2 Nissen fundoplication.  She states that she felt dizzy prior to presentation.  She denies any NSAID use; just takes tylenol.      Intake/Output Summary (Last 24 hours) at 09/14/2022 1849 Last data filed at 09/14/2022 1610 Gross per 24 hour  Intake 2745.75 ml  Output 2350 ml  Net 395.75 ml    Vitals:  Vitals:   09/13/22 2052 09/14/22 0201 09/14/22 0639 09/14/22 1433  BP: 129/66 112/62 (!) 142/52 112/81  Pulse: 90 (!) 101 99 89  Resp: 18 18 20    Temp: 97.9 F (36.6 C) 98.4 F (36.9 C) 98.2 F (36.8 C) 98.2 F (36.8 C)  TempSrc: Oral Oral Oral Oral  SpO2: 98% 95% 100%   Weight:      Height:          Physical Exam:  General:  elderly female in bed in NAD   HEENT: NCAT Eyes: EOMI sclera anicteric Neck: supple trachea midline  Heart: S1S2 no rub; tachycardic Lungs: unlabored and no audible wheezing; on room air  Abdomen: soft/nt/nd Extremities: no edema; no cyanosis or clubbing  Skin: no rash on extremities exposed Neuro: tremor noted GU foley in place  Medications reviewed   Labs:     Latest Ref Rng & Units 09/14/2022    5:08 PM 09/14/2022    8:58 AM 09/14/2022    7:01 AM  BMP  Glucose 70 - 99 mg/dL 962  952  71   BUN 8 - 23 mg/dL 51  61  61   Creatinine 0.44 - 1.00 mg/dL 8.41  3.24  4.01   Sodium 135 - 145 mmol/L 129  129  125   Potassium 3.5 - 5.1 mmol/L 4.2  2.9  4.7   Chloride 98 - 111 mmol/L 109  106  102   CO2 22 - 32 mmol/L 9  11  9    Calcium 8.9 - 10.3 mg/dL 6.6  6.9  6.5      Assessment/Plan:   AKI   - Felt pre-renal.  Patient with covid and severe n/d.  She has small kidneys with normal echogenicity on renal US.  Rec'd NS and LR thus far.  Up/cr ratio 1000 mg/g - Improving thankfully with supportive care and nonoliguric.   - Transition to bicarb gtt for better management of acidosis  - foley catheter placed in ER.  We can discontinue.  Monitor for retention per nursing protocol.  - would reduce dose of primidone to every 24 hours for now - continue supportive care for n/d. Note hx of Nissen fundoplication 03/2015   Hypokalemia - repleting; improving.  Giving additional potassium as we are transitioning to bicarb gtt   Hyponatremia  - improving with hydration    Metabolic acidosis - Secondary to AKI as well as GI losses  - transition from LR to bicarb gtt now that potassium has improved    Covid 19 PNA  - Per primary team   Please do not hesitate to contact me with any questions regarding our patient   Estanislado Emms, MD 09/14/2022 7:10 PM

## 2022-09-14 NOTE — Plan of Care (Signed)
°  Problem: Clinical Measurements: °Goal: Respiratory complications will improve °Outcome: Progressing °  °Problem: Pain Managment: °Goal: General experience of comfort will improve °Outcome: Progressing °  °Problem: Safety: °Goal: Ability to remain free from injury will improve °Outcome: Progressing °  °

## 2022-09-14 NOTE — Progress Notes (Signed)
Patient confused this morning. RN asked patient did she know where she is and patient stated "I'm at a wedding". Patient thought the month was December. She was able to tell RN her birthday and that she is in the hospital because of diarrhea. She also stated she didn't fall asleep until 2 am and that she has her days and nights mixed up. Vitals stable. On call provider made aware. No new orders placed. Updated dayshift nurse on findings.

## 2022-09-15 DIAGNOSIS — N179 Acute kidney failure, unspecified: Secondary | ICD-10-CM | POA: Diagnosis not present

## 2022-09-15 LAB — BASIC METABOLIC PANEL
Anion gap: 13 (ref 5–15)
BUN: 39 mg/dL — ABNORMAL HIGH (ref 8–23)
CO2: 14 mmol/L — ABNORMAL LOW (ref 22–32)
Calcium: 6.9 mg/dL — ABNORMAL LOW (ref 8.9–10.3)
Chloride: 106 mmol/L (ref 98–111)
Creatinine, Ser: 1.34 mg/dL — ABNORMAL HIGH (ref 0.44–1.00)
GFR, Estimated: 40 mL/min — ABNORMAL LOW (ref >60)
Glucose, Bld: 199 mg/dL — ABNORMAL HIGH (ref 70–99)
Potassium: 3.4 mmol/L — ABNORMAL LOW (ref 3.5–5.1)
Sodium: 133 mmol/L — ABNORMAL LOW (ref 135–145)

## 2022-09-15 LAB — CBC WITH DIFFERENTIAL/PLATELET
Abs Immature Granulocytes: 0.08 10*3/uL — ABNORMAL HIGH (ref 0.00–0.07)
Basophils Absolute: 0 10*3/uL (ref 0.0–0.1)
Basophils Relative: 0 %
Eosinophils Absolute: 0 10*3/uL (ref 0.0–0.5)
Eosinophils Relative: 0 %
HCT: 31.3 % — ABNORMAL LOW (ref 36.0–46.0)
Hemoglobin: 10.9 g/dL — ABNORMAL LOW (ref 12.0–15.0)
Immature Granulocytes: 1 %
Lymphocytes Relative: 7 %
Lymphs Abs: 0.5 10*3/uL — ABNORMAL LOW (ref 0.7–4.0)
MCH: 33.7 pg (ref 26.0–34.0)
MCHC: 34.8 g/dL (ref 30.0–36.0)
MCV: 96.9 fL (ref 80.0–100.0)
Monocytes Absolute: 0.8 10*3/uL (ref 0.1–1.0)
Monocytes Relative: 10 %
Neutro Abs: 6.5 10*3/uL (ref 1.7–7.7)
Neutrophils Relative %: 82 %
Platelets: 215 10*3/uL (ref 150–400)
RBC: 3.23 MIL/uL — ABNORMAL LOW (ref 3.87–5.11)
RDW: 14.5 % (ref 11.5–15.5)
WBC: 7.9 10*3/uL (ref 4.0–10.5)
nRBC: 0 % (ref 0.0–0.2)

## 2022-09-15 MED ORDER — LACTATED RINGERS IV SOLN: INTRAVENOUS | Status: DC

## 2022-09-15 MED ORDER — POTASSIUM CHLORIDE CRYS ER 20 MEQ PO TBCR
40.0000 meq | EXTENDED_RELEASE_TABLET | Freq: Once | ORAL | Status: AC
  Administered 2022-09-15: 40 meq via ORAL
  Filled 2022-09-15: qty 2

## 2022-09-15 NOTE — Progress Notes (Signed)
[facility] Progress Note  Name: Brenda [name] MRN: [id number] DOB: 25-Jan-1940  Chief Complaint:  Abdominal pain and diarrhea  Subjective:  she had 1.3 liters UOP over 8/27 and another 1 unmeasured urine void.  She is glad to be doing better.  Has been on bicarb.   Review of systems:    Chronic diarrhea; no emesis  She denies shortness of breath  No chest pain   -----------------  Background on consult:  Brenda [name] is a 82 y.o. female with a history of HTN, anxiety, migraine, and charted history of chronic diarrhea who presented for evaluation of abdominal pain, n/v and diarrhea.  She was found to have AKI with Cr 6.62.  She was also found to have marked hypokalemia.  Nephrology is consulted for assistance with management. Per ER assessment she appeared dry on admission.  Foley was ordered in the ER.  Note that on chart review she was recently diagnosed with covid 19 PNA and is on day 4 of treatment with paxlovid.  No urine output is charted.  Repeat labs were ordered on consult and were just obtained - in process.   Note that she states that she has had diarrhea for a few years after mission trips out of the country; it has been worse over the past week.  She has had nausea but cannot vomit 2/2 Nissen fundoplication.  She states that she felt dizzy prior to presentation.  She denies any NSAID use; just takes tylenol.      Intake/Output Summary (Last 24 hours) at 09/15/2022 1601 Last data filed at 09/15/2022 1300 Gross per 24 hour  Intake 2754.52 ml  Output 1100 ml  Net 1654.52 ml    Vitals:  Vitals:   09/14/22 1433 09/14/22 1920 09/15/22 0315 09/15/22 1311  BP: 112/81 (!) 140/66 95/74 (!) 147/88  Pulse: 89 (!) 101 100 (!) 110  Resp:  18 18 17   Temp: 98.2 F (36.8 C) 98.2 F (36.8 C) 98.2 F (36.8 C) 98 F (36.7 C)  TempSrc: Oral Oral Oral Oral  SpO2:  99% 98% 99%  Weight:      Height:         Physical Exam:    General:  elderly female in bed in NAD    HEENT: NCAT Eyes: EOMI sclera anicteric Neck: supple trachea midline  Heart: S1S2 no rub; tachycardic Lungs: unlabored and no audible wheezing; on room air  Abdomen: soft/nt/nd Extremities: no edema; no cyanosis or clubbing  Skin: no rash on extremities exposed Neuro: tremor noted  Medications reviewed   Labs:     Latest Ref Rng & Units 09/15/2022    4:27 AM 09/14/2022    5:08 PM 09/14/2022    8:58 AM  BMP  Glucose 70 - 99 mg/dL 469  629  528   BUN 8 - 23 mg/dL 39  51  61   Creatinine 0.44 - 1.00 mg/dL 4.13  2.44  0.10   Sodium 135 - 145 mmol/L 133  129  129   Potassium 3.5 - 5.1 mmol/L 3.4  4.2  2.9   Chloride 98 - 111 mmol/L 106  109  106   CO2 22 - 32 mmol/L 14  9  11    Calcium 8.9 - 10.3 mg/dL 6.9  6.6  6.9      Assessment/Plan:   AKI   - Felt pre-renal.  Patient with covid and severe n/d.  She has small kidneys with normal echogenicity on renal US.  Rec'd NS and LR thus far.  Up/cr ratio 1000 mg/g - Improving thankfully with supportive care and nonoliguric.   - Transitioned to bicarb gtt for better management of acidosis.  Will transition back to LR   - primidone - can be increased back to her usual dosing given improvement in her renal function  - continue supportive care for n/d. Note hx of Nissen fundoplication 03/2015   Hypokalemia - repleting; improving.  Giving additional potassium as we are transitioning to bicarb gtt - would anticipate potassium chloride 30 meq PO daily on discharge -  Please reach back out to nephrology if dosing questions.  - would recommend BMP and magnesium one week after discharge with PCP   Hyponatremia  - improving with hydration    Metabolic acidosis - Secondary to AKI as well as GI losses.  Hx of acidosis which may be secondary to chronic diarrhea - transition back to LR  - Continue PO bicarbonate.  Would ultimately transition to sodium bicarbonate 650 mg TID on discharge   Covid 19 infection  - Per primary team   Nephrology  will sign off.  Will set up routine follow-up in 2 weeks with our office.    Estanislado Emms, MD 09/15/2022 4:26 PM

## 2022-09-15 NOTE — Progress Notes (Signed)
Mobility Specialist - Progress Note   09/15/22 1110  Mobility  Activity Transferred from bed to chair  Level of Assistance Contact guard assist, steadying assist  Assistive Device None  Distance Ambulated (ft) 3 ft  Range of Motion/Exercises Active  Activity Response Tolerated well  Mobility Referral Yes  $Mobility charge 1 Mobility  Mobility Specialist Start Time (ACUTE ONLY) 1055  Mobility Specialist Stop Time (ACUTE ONLY) 1108  Mobility Specialist Time Calculation (min) (ACUTE ONLY) 13 min   Pt received in bed and agreed to mobility. Had no issues and returned to chair with all needs met.  Marilynne Halsted Mobility Specialist

## 2022-09-15 NOTE — Progress Notes (Signed)
PROGRESS NOTE  Brenda [name]  DOB: June 19, 1940  PCP: Clovis Riley, L.August Saucer, MD UJW:[id number]  DOA: 09/13/2022  LOS: 2 days  Hospital Day: 3  Brief narrative: Brenda [name] is a 82 y.o. female with PMH significant for HTN, HLD, chronic diarrhea, anxiety, migraine, hiatal hernia, coarse tremor, chronic diarrhea, weight loss who is a long-term resident at [facility] independent facility 4 days prior, patient tested positive for COVID at the facility and was started on Paxlovid. 8/26, patient was sent to the ED with complaint of abdominal pain, nausea, vomiting and diarrhea.  Since the diagnosis of COVID, patient has had poor appetite, episodes of lightheadedness and poor urine output.  In the ED, patient was afebrile, hemodynamically stable, breathing room air Initial labs with WC count 19,000, sodium 123, potassium 2.1, bicarb low at 13, BUN/creatinine elevated to 74/6.62, anion gap more than 20, lipase elevated to 118, lactic acid level normal Urinalysis clear yellow urine with small amount of hemoglobin, rare bacteria Blood culture sent Chest x-ray unremarkable  Renal ultrasound  unremarkable. CT chest abdomen pelvis showed gas fluid levels throughout the colon to the level of the rectum, consistent with history of diarrhea. No bowel obstruction or ileus.  Patient was given IV fluid, potassium replacement Admitted to [facility] Nephrologist consulted  Subjective: Patient was seen and examined this morning. Lying on bed.  Feels better as her diarrhea has slowed down.  Husband at bedside. Ambulated with therapy.  Assessment and plan: Severe hypokalemia Potassium level was significantly low.  Trend as below.  Aggressively replaced.  Labs this morning with potassium at 3.4.  Replacement given. Recent Labs  Lab 09/13/22 1907 09/14/22 0019 09/14/22 0701 09/14/22 0858 09/14/22 1708 09/15/22 0427  K <2.0* 2.5* 4.7 2.9* 4.2 3.4*  MG 2.0  --   --   --   --   --    AKI (acute kidney  injury) Acute anion gap metabolic acidosis Prerenal secondary to vomiting and poor intake Nephrology consult appreciated. Gradually improving creatinine with IV fluid. Blood glucose level normal, anion gap normalized, urine ketone level normal. However, serum bicarb level still low.  On bicarbonate supplement and bicarb drip since yesterday.  Recent Labs    07/13/22 0414 07/14/22 0426 07/17/22 1043 09/13/22 1330 09/13/22 1907 09/14/22 0019 09/14/22 0701 09/14/22 0858 09/14/22 1708 09/15/22 0427  BUN <5* <5* 10 74* 70* 69* 61* 61* 51* 39*  CREATININE 0.76 0.75 0.90 6.62* 5.66* 4.91* 3.86* 3.33* 2.26* 1.34*  CO2 17* 22 25 13* 10* 10* 9* 11* 9* 14*   COVID-19 virus infection  Emphysema Received 4 days of Paxlovid.  Will hold for now given poor renal function Currently breathing on room air Bronchodilators as needed  Acute worsening of chronic diarrhea  Patient states that she had been struggling with diarrhea for the last several months after a trip to [place].  In the past, she was seen by GI Dr. Dulce Sellar, and tried on Imodium with partial relief.  She was maintained on budesonide 3 mg daily. Since the diagnosis of COVID-19 infection, diarrhea has worsened. CT abdomen with fluid-filled colon consistent with diarrhea 8/27, started on scheduled Imodium and Questran.  Patient has felt improved since then.  Continue to monitor.  Essential hypertension PTA on Lasix 20 mg daily, lisinopril 10 mg daily Currently on hold due to AKI.  Continue to monitor blood pressure.   Aortic Atherosclerosis HLD Continue Lipitor  GERD s/p Nissen fundoplication - 2017 Continue pantoprazole 40 mg p.o. daily.   Pressure  injury of skin Continue local care.   Essential tremor PTA on primidone 100 mg p.o. twice daily, Dose reduced due to AKI.  Anxiety/depression PTA on Cymbalta 20 mg daily, Xanax 0.25 mg daily as needed  Cholelithiasis  without evidence of acute cholecystitis  Chronic  vertebral fractures  CT scan showed chronic appearing mild anterior wedge compression deformities at T1 and T3. Chronic L2 wedge compression deformity with prior vertebral augmentation. Pain management as needed.   Mobility: Encourage ambulation.  PT eval  Goals of care   Code Status: Full Code     DVT prophylaxis:  heparin injection 5,000 Units Start: 09/13/22 1545   Antimicrobials: None Fluid: Bicarbonate drip per nephrology Consultants: Nephrology Family Communication: Husband at bedside  Status: Inpatient Level of care:  Progressive   Patient is from: [facility] independent living facility Needs to continue in-hospital care: Diarrhea improving renal analgiser improving Anticipated d/c to: Hopefully back to the ILF with home health in 1 to 2 days      Diet:  Diet Order             Diet Heart Room service appropriate? Yes; Fluid consistency: Thin  Diet effective now                   Scheduled Meds:  budesonide  9 mg Oral Daily   Chlorhexidine Gluconate Cloth  6 each Topical Daily   cholestyramine light  4 g Oral 3 times per day   DULoxetine  20 mg Oral Daily   feeding supplement  237 mL Oral BID BM   heparin  5,000 Units Subcutaneous Q8H   ondansetron (ZOFRAN) IV  4 mg Intravenous Once   pantoprazole  40 mg Oral Daily   primidone  100 mg Oral Daily   sodium bicarbonate  1,300 mg Oral BID    PRN meds: acetaminophen **OR** acetaminophen, ondansetron **OR** ondansetron (ZOFRAN) IV, mouth rinse   Infusions:   sodium bicarbonate 150 mEq in dextrose 5 % 1,150 mL infusion 75 mL/hr at 09/14/22 2201    Antimicrobials: Anti-infectives (From admission, onward)    Start     Dose/Rate Route Frequency Ordered Stop   09/13/22 2200  nirmatrelvir/ritonavir (renal dosing) (PAXLOVID) 2 tablet  Status:  Discontinued       Note to Pharmacy: Please adjust or discontinue depending on GFR. TY.   2 tablet Oral 2 times daily 09/13/22 1753 09/13/22 1805        Nutritional status:  Body mass index is 17.41 kg/m.          Objective: Vitals:   09/15/22 0315 09/15/22 1311  BP: 95/74 (!) 147/88  Pulse: 100 (!) 110  Resp: 18 17  Temp: 98.2 F (36.8 C) 98 F (36.7 C)  SpO2: 98% 99%    Intake/Output Summary (Last 24 hours) at 09/15/2022 1405 Last data filed at 09/15/2022 0930 Gross per 24 hour  Intake 2774.52 ml  Output 1100 ml  Net 1674.52 ml   Filed Weights   09/13/22 1205  Weight: 46 kg   Weight change:  Body mass index is 17.41 kg/m.   Physical Exam: General exam: Pleasant, elderly Caucasian female.  Not in distress Skin: No rashes, lesions or ulcers. HEENT: Atraumatic, normocephalic, no obvious bleeding Lungs: Clear to auscultation bilaterally CVS: Regular rate and rhythm, no murmur GI/Abd soft, nontender, nondistended, bowel sound present CNS: Alert, awake, oriented x 3.  Has chronic resting tremors Psychiatry: Mood appropriate Extremities: No pedal edema, no calf tenderness  Data Review:  I have personally reviewed the laboratory data and studies available.  F/u labs ordered Unresulted Labs (From admission, onward)     Start     Ordered   09/15/22 0500  Basic metabolic panel  Daily,   R      09/13/22 1542   09/15/22 0500  CBC with Differential/Platelet  Daily,   R      09/14/22 1408            Total time spent in review of labs and imaging, patient evaluation, formulation of plan, documentation and communication with family: 45 minutes  Signed, Lorin Glass, MD [facility] 09/15/2022

## 2022-09-15 NOTE — Progress Notes (Signed)
Occupational Therapy Evaluation Patient Details Name: Brenda [name] MRN: [id number] DOB: 11-Feb-1940 Today's Date: 09/15/2022   Clinical Impression: Pt today is unreliable historian. She presents with generalized weakness, decreased activity tolerance, and cognitive deficits (STM, problem solving) impacting her ability to perform ADL and functional transfers. She is more obtunded than during PT evaluation yesterday and declined most offered therapeutic interventions today - currently she is overall mod A to set up for UB ADL and mod A for LB ADL. She required min A for bed mobility and refused OOB during this session despite encouragement. She perseverated on her husband arriving and being with her. At this time OT is recommending post-acute rehab of <3 hours daily to maximize safety and independence in ADL and functional transfers. I am hopeful if she would start to do more in the hospital that she will progress to a level of HH therapy at her ILF with 24/7 supervision from her husband - but currently she will require post-acute rehab at a facility.     09/15/22 0900  OT Visit Information  Last OT Received On 09/15/22  Assistance Needed +1  History of Present Illness 82 yo female admitted with COVID, nausea, diarrhea. Hx of anxiety, chronic diarrhea, tremors, cervical DDD, skin ca, osteoporosis, migraines, vertebral fxs  Precautions  Precautions Fall  Precaution Comments diarrhea  Restrictions  Weight Bearing Restrictions No  Home Living  Family/patient expects to be discharged to: Private residence (ILF)  Living Arrangements Spouse/significant other  Available Help at Discharge Family  Type of Home Independent living facility  Home Access Level entry  Home Layout One level  Bathroom Shower/Tub Walk-in Counselling psychologist  Home Equipment Shower seat;BSC/3in1;Rolling Environmental consultant (2 wheels)  Prior Function  Prior Level of Function  Patient poor historian/Family not available   Mobility Comments ambulatory with RW but participating in PT  ADLs Comments unreliable. need to investigate further  Pain Assessment  Pain Assessment Faces  Faces Pain Scale 2  Pain Location generalized body language of discomfort  Pain Descriptors / Indicators Grimacing  Pain Intervention(s) Limited activity within patient's tolerance;Monitored during session;Repositioned  Cognition  Arousal Obtunded  Behavior During Therapy Anxious  Overall Cognitive Status No family/caregiver present to determine baseline cognitive functioning  Area of Impairment Memory;Problem solving  Memory Decreased short-term memory  Problem Solving Decreased initiation;Requires verbal cues;Slow processing  General Comments could not remember name or breed of her dog, perseverating on when her husband would arrive. delayed responses to questions of baseline functioning  Communication  Communication No apparent difficulties (delayed processing time)  Cueing Techniques Gestural cues;Tactile cues;Verbal cues  Upper Extremity Assessment  Upper Extremity Assessment Generalized weakness  Lower Extremity Assessment  Lower Extremity Assessment Defer to PT evaluation  Cervical / Trunk Assessment  Cervical / Trunk Assessment Kyphotic (hx of back issues at baseline)  Vision- History  Baseline Vision/History 1 Wears glasses  Patient Visual Report No change from baseline  ADL  Overall ADL's  Needs assistance/impaired  Eating/Feeding Set up  Grooming Minimal assistance  Upper Body Bathing Moderate assistance  Lower Body Bathing Moderate assistance  Upper Body Dressing  Minimal assistance  Lower Body Dressing Maximal assistance;Bed level  Lower Body Dressing Details (indicate cue type and reason) socks at bed level  Toilet Transfer Moderate assistance  Toileting- Clothing Manipulation and Hygiene Maximal assistance  Functional mobility during ADLs Minimal assistance  General ADL Comments decreased motivation for  participation today  Bed Mobility  Overal bed mobility Needs Assistance  Bed  Mobility Supine to Sit;Sit to Supine  Supine to sit Min assist  Sit to supine Min assist  General bed mobility comments Assist for trunk and LEs. Increased time. Cues and encouragement required.  Transfers  General transfer comment NT this session, refused OOB  General Comments  General comments (skin integrity, edema, etc.) Perseverating on husband coming in  OT - End of Session  Activity Tolerance Patient limited by fatigue  Patient left in bed;with call bell/phone within reach;with bed alarm set  Nurse Communication Mobility status;Precautions  OT Assessment  OT Recommendation/Assessment Patient needs continued OT Services  OT Visit Diagnosis Muscle weakness (generalized) (M62.81);Other symptoms and signs involving cognitive function  OT Problem List Decreased strength;Decreased activity tolerance;Impaired balance (sitting and/or standing);Decreased safety awareness  OT Plan  OT Frequency (ACUTE ONLY) Min 1X/week  OT Treatment/Interventions (ACUTE ONLY) Self-care/ADL training;Therapeutic exercise;Energy conservation;DME and/or AE instruction;Therapeutic activities;Patient/family education;Balance training  AM-PAC OT "6 Clicks" Daily Activity Outcome Measure (Version 2)  Help from another person eating meals? 4  Help from another person taking care of personal grooming? 3  Help from another person toileting, which includes using toliet, bedpan, or urinal? 2  Help from another person bathing (including washing, rinsing, drying)? 2  Help from another person to put on and taking off regular upper body clothing? 3  Help from another person to put on and taking off regular lower body clothing? 2  6 Click Score 16  Progressive Mobility  What is the highest level of mobility based on the progressive mobility assessment? Level 2 (Chairfast) - Balance while sitting on edge of bed and cannot stand  Mobility Referral  Yes  Activity Dangled on edge of bed  OT Recommendation  Recommendations for Other Services PT consult  Follow Up Recommendations Skilled nursing-short term rehab (<3 hours/day) (may progress to [facility])  Patient can return home with the following A little help with walking and/or transfers;A lot of help with bathing/dressing/bathroom;Assistance with cooking/housework;Help with stairs or ramp for entrance;Supervision due to cognitive status  Functional Status Assessent Patient has had a recent decline in their functional status and demonstrates the ability to make significant improvements in function in a reasonable and predictable amount of time.  OT Equipment Other (comment) (defer to next venue)  Individuals Consulted  Consulted and Agree with Results and Recommendations Patient unable/family or caregiver not available  Acute Rehab OT Goals  Patient Stated Goal "for my husband to get here"  OT Goal Formulation With patient  Time For Goal Achievement 09/29/22  Potential to Achieve Goals Good  OT Time Calculation  OT Start Time (ACUTE ONLY) 0859  OT Stop Time (ACUTE ONLY) 0920  OT Time Calculation (min) 21 min  OT General Charges  $OT Visit 1 Visit  OT Evaluation  $OT Eval Moderate Complexity 1 Mod   Nyoka Cowden OTR/L Acute Rehabilitation Services Office: [phone]

## 2022-09-16 DIAGNOSIS — N179 Acute kidney failure, unspecified: Secondary | ICD-10-CM | POA: Diagnosis not present

## 2022-09-16 LAB — CBC WITH DIFFERENTIAL/PLATELET
Abs Immature Granulocytes: 0.09 10*3/uL — ABNORMAL HIGH (ref 0.00–0.07)
Basophils Absolute: 0 10*3/uL (ref 0.0–0.1)
Basophils Relative: 0 %
Eosinophils Absolute: 0 10*3/uL (ref 0.0–0.5)
Eosinophils Relative: 0 %
HCT: 29.1 % — ABNORMAL LOW (ref 36.0–46.0)
Hemoglobin: 10.1 g/dL — ABNORMAL LOW (ref 12.0–15.0)
Immature Granulocytes: 1 %
Lymphocytes Relative: 12 %
Lymphs Abs: 0.9 10*3/uL (ref 0.7–4.0)
MCH: 33.6 pg (ref 26.0–34.0)
MCHC: 34.7 g/dL (ref 30.0–36.0)
MCV: 96.7 fL (ref 80.0–100.0)
Monocytes Absolute: 1.2 10*3/uL — ABNORMAL HIGH (ref 0.1–1.0)
Monocytes Relative: 15 %
Neutro Abs: 5.7 10*3/uL (ref 1.7–7.7)
Neutrophils Relative %: 72 %
Platelets: 185 10*3/uL (ref 150–400)
RBC: 3.01 MIL/uL — ABNORMAL LOW (ref 3.87–5.11)
RDW: 14.3 % (ref 11.5–15.5)
WBC: 8 10*3/uL (ref 4.0–10.5)
nRBC: 0 % (ref 0.0–0.2)

## 2022-09-16 LAB — BASIC METABOLIC PANEL
Anion gap: 9 (ref 5–15)
BUN: 21 mg/dL (ref 8–23)
CO2: 20 mmol/L — ABNORMAL LOW (ref 22–32)
Calcium: 6.3 mg/dL — CL (ref 8.9–10.3)
Chloride: 103 mmol/L (ref 98–111)
Creatinine, Ser: 0.78 mg/dL (ref 0.44–1.00)
GFR, Estimated: >60 mL/min (ref >60)
Glucose, Bld: 127 mg/dL — ABNORMAL HIGH (ref 70–99)
Potassium: 3.5 mmol/L (ref 3.5–5.1)
Sodium: 132 mmol/L — ABNORMAL LOW (ref 135–145)

## 2022-09-16 MED ORDER — POTASSIUM CHLORIDE CRYS ER 20 MEQ PO TBCR
30.0000 meq | EXTENDED_RELEASE_TABLET | Freq: Every day | ORAL | Status: DC
  Administered 2022-09-17: 30 meq via ORAL
  Filled 2022-09-16: qty 1

## 2022-09-16 MED ORDER — DIPHENHYDRAMINE HCL 25 MG PO CAPS
25.0000 mg | ORAL_CAPSULE | Freq: Once | ORAL | Status: AC
  Administered 2022-09-17: 25 mg via ORAL
  Filled 2022-09-16: qty 1

## 2022-09-16 MED ORDER — SODIUM BICARBONATE 650 MG PO TABS
650.0000 mg | ORAL_TABLET | Freq: Three times a day (TID) | ORAL | Status: DC
  Administered 2022-09-16 – 2022-09-17 (×4): 650 mg via ORAL
  Filled 2022-09-16 (×4): qty 1

## 2022-09-16 MED ORDER — POTASSIUM CHLORIDE CRYS ER 20 MEQ PO TBCR: 30.0000 meq | EXTENDED_RELEASE_TABLET | Freq: Every day | ORAL | Status: DC

## 2022-09-16 MED ORDER — PRIMIDONE 50 MG PO TABS
100.0000 mg | ORAL_TABLET | Freq: Three times a day (TID) | ORAL | Status: DC
  Administered 2022-09-16 – 2022-09-17 (×3): 100 mg via ORAL
  Filled 2022-09-16 (×4): qty 2

## 2022-09-16 MED ORDER — CALCIUM GLUCONATE-NACL 2-0.675 GM/100ML-% IV SOLN
2.0000 g | Freq: Once | INTRAVENOUS | Status: AC
  Administered 2022-09-16: 2000 mg via INTRAVENOUS
  Filled 2022-09-16: qty 100

## 2022-09-16 MED ORDER — CARVEDILOL 3.125 MG PO TABS
3.1250 mg | ORAL_TABLET | Freq: Two times a day (BID) | ORAL | Status: DC
  Administered 2022-09-16 – 2022-09-17 (×2): 3.125 mg via ORAL
  Filled 2022-09-16 (×3): qty 1

## 2022-09-16 MED ORDER — TRAZODONE HCL 50 MG PO TABS
25.0000 mg | ORAL_TABLET | Freq: Every evening | ORAL | Status: DC | PRN
  Administered 2022-09-16: 25 mg via ORAL
  Filled 2022-09-16: qty 1

## 2022-09-16 NOTE — TOC Progression Note (Addendum)
Transition of Care [facility]) - Progression Note    Patient Details  Name: PARTICIA HORI MRN: [id number] Date of Birth: 04-Feb-1940  Transition of [facility]) CM/SW Contact  Larrie Kass, LCSW Phone Number: 09/16/2022, 2:30 PM  Clinical Narrative:    CSW spoke with pt's husband to discuss discharge planing. Pt reports driving and stated he will call CSW back. TOC to follow.    [place]  3:00pm CSW spoke with [place] with [employer], she reported pt has benefit that allows her for 30 free days of rehab though [employer]. TOC to follow.   Expected Discharge Plan:  (TBD)   Expected Discharge Plan and Services       Living arrangements for the past 2 months: Independent Living Facility                                       Social Determinants of Health (SDOH) Interventions SDOH Screenings   Food Insecurity: No Food Insecurity (09/13/2022)  Housing: Low Risk  (09/13/2022)  Transportation Needs: No Transportation Needs (09/13/2022)  Utilities: Not At Risk (09/13/2022)  Tobacco Use: Medium Risk (09/13/2022)    Readmission Risk Interventions     No data to display

## 2022-09-16 NOTE — Progress Notes (Signed)
PROGRESS NOTE  Brenda [name]  DOB: 09-19-1940  PCP: Clovis Riley, L.August Saucer, MD IRS:[id number]  DOA: 09/13/2022  LOS: 3 days  Hospital Day: 4  Brief narrative: Brenda [name] is a 82 y.o. female with PMH significant for HTN, HLD, chronic diarrhea, anxiety, migraine, hiatal hernia, coarse tremor, chronic diarrhea, weight loss who is a long-term resident at [facility] independent facility 4 days prior, patient tested positive for COVID at the facility and was started on Paxlovid. 8/26, patient was sent to the ED with complaint of abdominal pain, nausea, vomiting and diarrhea.  Since the diagnosis of COVID, patient has had poor appetite, episodes of lightheadedness and poor urine output.  In the ED, patient was afebrile, hemodynamically stable, breathing room air Initial labs with WC count 19,000, sodium 123, potassium 2.1, bicarb low at 13, BUN/creatinine elevated to 74/6.62, anion gap more than 20, lipase elevated to 118, lactic acid level normal Urinalysis clear yellow urine with small amount of hemoglobin, rare bacteria Blood culture sent Chest x-ray unremarkable  Renal ultrasound  unremarkable. CT chest abdomen pelvis showed gas fluid levels throughout the colon to the level of the rectum, consistent with history of diarrhea. No bowel obstruction or ileus.  Patient was given IV fluid, potassium replacement Admitted to [facility] Nephrologist consulted  Subjective: Patient was seen and examined this morning. Lying on bed.  Husband at bedside.  Patient feels better as her diarrhea has slowed down. It seems patient and family are expecting a rehab discharge.  Assessment and plan: Acute worsening of chronic diarrhea  Patient states that she had been struggling with diarrhea for the last several months after a trip to [place].  In the past, she was seen by GI Dr. Dulce Sellar, and tried on Imodium with partial relief.  She was maintained on budesonide 3 mg daily. Since the diagnosis of COVID-19  infection, diarrhea has worsened. CT abdomen with fluid-filled colon consistent with diarrhea 8/27, started on scheduled Imodium and Questran.  Patient has felt improved since then. Continue LR at 70 mL/h for next 24 hours.  Severe hypokalemia Potassium level was significantly low.  Trend as below.  Improved with aggressive repletion. Per nephrology recommendation, will plan to continue KCl 30 mg daily at discharge. Recent Labs  Lab 09/13/22 1907 09/14/22 0019 09/14/22 0701 09/14/22 0858 09/14/22 1708 09/15/22 0427 09/16/22 0426  K <2.0*   < > 4.7 2.9* 4.2 3.4* 3.5  MG 2.0  --   --   --   --   --   --    < > = values in this interval not displayed.   AKI (acute kidney injury) Acute anion gap metabolic acidosis Prerenal secondary to vomiting and poor intake Nephrology consult appreciated. Creatinine and bicarbonate level significantly improved with hydration. Per nephrology recommendation, plan to continue sodium bicarb 650 mg 3 times daily at discharge.  Recent Labs    07/14/22 0426 07/17/22 1043 09/13/22 1330 09/13/22 1907 09/14/22 0019 09/14/22 0701 09/14/22 0858 09/14/22 1708 09/15/22 0427 09/16/22 0426  BUN <5* 10 74* 70* 69* 61* 61* 51* 39* 21  CREATININE 0.75 0.90 6.62* 5.66* 4.91* 3.86* 3.33* 2.26* 1.34* 0.78  CO2 22 25 13* 10* 10* 9* 11* 9* 14* 20*   COVID-19 virus infection  Emphysema Received 4 days of Paxlovid.  Will hold for now given poor renal function Currently breathing on room air Bronchodilators as needed  Essential hypertension PTA on Lasix 20 mg daily, lisinopril 10 mg daily Currently on hold due to AKI.  Blood pressure rising up.  Given tachycardia today, will start on Coreg 3.125 mg twice daily.   Aortic Atherosclerosis HLD Continue Lipitor  GERD s/p Nissen fundoplication - 2017 Continue pantoprazole 40 mg p.o. daily.   Pressure injury of skin Continue local care.   Essential tremor PTA on primidone 100 mg p.o. 3 times daily.   Same regimen resumed.  Anxiety/depression PTA on Cymbalta 20 mg daily, Xanax 0.25 mg daily as needed  Cholelithiasis  without evidence of acute cholecystitis  Chronic vertebral fractures  CT scan showed chronic appearing mild anterior wedge compression deformities at T1 and T3. Chronic L2 wedge compression deformity with prior vertebral augmentation. Pain management as needed.   Mobility: Encourage ambulation.  PT eval  Goals of care   Code Status: Full Code     DVT prophylaxis:  heparin injection 5,000 Units Start: 09/13/22 1545   Antimicrobials: None Fluid: Currently on LR at 75 mL/h Consultants: Nephrology Family Communication: Husband at bedside  Status: Inpatient Level of care:  Progressive   Patient is from: [facility] independent living facility Needs to continue in-hospital care: Diarrhea improving renal function improving Anticipated d/c to: Family wants SNF      Diet:  Diet Order             Diet Heart Room service appropriate? Yes; Fluid consistency: Thin  Diet effective now                   Scheduled Meds:  budesonide  9 mg Oral Daily   Chlorhexidine Gluconate Cloth  6 each Topical Daily   cholestyramine light  4 g Oral 3 times per day   DULoxetine  20 mg Oral Daily   feeding supplement  237 mL Oral BID BM   heparin  5,000 Units Subcutaneous Q8H   ondansetron (ZOFRAN) IV  4 mg Intravenous Once   pantoprazole  40 mg Oral Daily   [START ON 09/17/2022] potassium chloride  30 mEq Oral Daily   primidone  100 mg Oral Q8H   sodium bicarbonate  650 mg Oral TID    PRN meds: acetaminophen **OR** acetaminophen, ondansetron **OR** ondansetron (ZOFRAN) IV, mouth rinse   Infusions:   lactated ringers 75 mL/hr at 09/16/22 0555    Antimicrobials: Anti-infectives (From admission, onward)    Start     Dose/Rate Route Frequency Ordered Stop   09/13/22 2200  nirmatrelvir/ritonavir (renal dosing) (PAXLOVID) 2 tablet  Status:  Discontinued        Note to Pharmacy: Please adjust or discontinue depending on GFR. TY.   2 tablet Oral 2 times daily 09/13/22 1753 09/13/22 1805       Nutritional status:  Body mass index is 17.41 kg/m.          Objective: Vitals:   09/16/22 0643 09/16/22 1307  BP:  (!) 151/91  Pulse:  (!) 116  Resp: 15 17  Temp:  99 F (37.2 C)  SpO2:  94%    Intake/Output Summary (Last 24 hours) at 09/16/2022 1326 Last data filed at 09/16/2022 0930 Gross per 24 hour  Intake 1747.67 ml  Output 400 ml  Net 1347.67 ml   Filed Weights   09/13/22 1205  Weight: 46 kg   Weight change:  Body mass index is 17.41 kg/m.   Physical Exam: General exam: Pleasant, elderly Caucasian female.  Not in distress Skin: No rashes, lesions or ulcers. HEENT: Atraumatic, normocephalic, no obvious bleeding Lungs: Clear to auscultation bilaterally CVS: Regular rate and rhythm, no murmur  GI/Abd soft, nontender, nondistended, bowel sound present CNS: Alert, awake, oriented x 3.  Has chronic resting tremors Psychiatry: Mood appropriate Extremities: No pedal edema, no calf tenderness  Data Review: I have personally reviewed the laboratory data and studies available.  F/u labs ordered Unresulted Labs (From admission, onward)     Start     Ordered   09/15/22 0500  Basic metabolic panel  Daily,   R      09/13/22 1542   09/15/22 0500  CBC with Differential/Platelet  Daily,   R      09/14/22 1408            Total time spent in review of labs and imaging, patient evaluation, formulation of plan, documentation and communication with family: 45 minutes  Signed, Lorin Glass, MD [facility] 09/16/2022

## 2022-09-16 NOTE — Progress Notes (Addendum)
Physical Therapy Treatment Patient Details Name: Brenda [name] MRN: [id number] DOB: [birth year] Today's Date: 09/16/2022   History of Present Illness 82 yo female admitted with COVID, nausea, diarrhea. Hx of anxiety, chronic diarrhea, tremors, cervical DDD, skin ca, osteoporosis, migraines, vertebral fxs    PT Comments  Pt assisted with ambulating in room to use bathroom however required BSC due to urgency and fatigue (having diarrhea). Pt assisted to [facility] and then recliner.  Spouse present today and reports pt using RW at home for ambulation.  Pt encouraged to perform more OOB activity as she states she wishes to discharge home.  Pt has really only performed short distances of ambulation in room and fatigues quickly.  If spouse is unable to provide assist upon d/c then patient will benefit from continued inpatient follow up therapy, <3 hours/day.      If plan is discharge home, recommend the following: A little help with walking and/or transfers;A little help with bathing/dressing/bathroom;Assistance with cooking/housework;Assist for transportation;Help with stairs or ramp for entrance   Can travel by private vehicle        Equipment Recommendations  Rolling walker (2 wheels)    Recommendations for Other Services       Precautions / Restrictions Precautions Precautions: Fall Precaution Comments: diarrhea Restrictions Weight Bearing Restrictions: No     Mobility  Bed Mobility Overal bed mobility: Needs Assistance Bed Mobility: Supine to Sit     Supine to sit: Min assist     General bed mobility comments: assist for trunk upright    Transfers Overall transfer level: Needs assistance Equipment used: 1 person hand held assist Transfers: Sit to/from Stand Sit to Stand: Min assist           General transfer comment: assist to stand and steady    Ambulation/Gait Ambulation/Gait assistance: Min assist Gait Distance (Feet): 4 Feet Assistive device: IV Pole, 1 person  hand held assist Gait Pattern/deviations: Step-through pattern, Decreased stride length, Narrow base of support       General Gait Details: pt declined using RW (spouse reports she uses RW at baseline however) very unsteady and reliant on UE support so provided HHA and pt used IV pole, ambulated 4 ft to [facility] and then from [facility] 6 ft to recliner; fatigued quickly   Stairs             Wheelchair Mobility     Tilt Bed    Modified Rankin (Stroke Patients Only)       Balance Overall balance assessment: Needs assistance         Standing balance support: Reliant on assistive device for balance, Bilateral upper extremity supported, During functional activity Standing balance-Leahy Scale: Poor                              Cognition Arousal: Alert Behavior During Therapy: Anxious                                 Problem Solving: Decreased initiation, Requires verbal cues, Slow processing          Exercises      General Comments        Pertinent Vitals/Pain Pain Assessment Pain Assessment: No/denies pain Pain Intervention(s): Monitored during session, Repositioned    Home Living  Prior Function            PT Goals (current goals can now be found in the care plan section) Progress towards PT goals: Progressing toward goals    Frequency    Min 1X/week      PT Plan      Co-evaluation              AM-PAC PT "6 Clicks" Mobility   Outcome Measure  Help needed turning from your back to your side while in a flat bed without using bedrails?: A Little Help needed moving from lying on your back to sitting on the side of a flat bed without using bedrails?: A Little Help needed moving to and from a bed to a chair (including a wheelchair)?: A Little Help needed standing up from a chair using your arms (e.g., wheelchair or bedside chair)?: A Little Help needed to walk in hospital room?: A  Little Help needed climbing 3-5 steps with a railing? : A Lot 6 Click Score: 17    End of Session Equipment Utilized During Treatment: Gait belt Activity Tolerance: Patient limited by fatigue Patient left: in chair;with call bell/phone within reach;with chair alarm set;with family/visitor present Nurse Communication: Mobility status PT Visit Diagnosis: Muscle weakness (generalized) (M62.81);Difficulty in walking, not elsewhere classified (R26.2)     Time: 1030-1050 PT Time Calculation (min) (ACUTE ONLY): 20 min  Charges:    $Gait Training: 8-22 mins PT General Charges $$ ACUTE PT VISIT: 1 Visit                     Paulino Door, DPT Physical Therapist Acute Rehabilitation Services Office: [phone]    Brenda [name] 09/16/2022, 12:23 PM

## 2022-09-16 NOTE — Consult Note (Signed)
   [facility] Inpatient Consult   09/16/2022  [name] 08/16/1940 [id number]  [facility] [[facility]]  Accountable Care Organization [ACO] Patient: Brenda [name] PPO insurance  [facility] Liaison remote coverage review for patient admitted to [facility]   Primary Care Provider:  Clovis Riley, L.August Saucer, MD  with Deboraha Sprang at [facility] is listed to provide the transition of care follow up calls and appointments  Patient screened for 2 hospitalizations in 6 months with noted to assess for potential [facility]  [[facility]] Care Management service needs for post hospital transition for care coordination.  Review of patient's electronic medical record reveals patient is from [facility] ILF per inpatient [facility] team documentation.  Patient admitted with COVID and acute kidney injury. PT/OT reveals patient with some cognitive deficits at the time of their review for possible ST SNF recommendations were noted.   Plan:  Continue to follow progress and disposition to assess for post hospital community care coordination/management needs.    Referral request for community care coordination: pending disposition needs and can update [facility] TOC team as needed  Of note, [facility] Care Management/Population Health does not replace or interfere with any arrangements made by the Inpatient Transition of Care team.  For questions contact:   Charlesetta Shanks, RN BSN CCM [facility]  [phone] business mobile phone Toll free office [phone]  Fax number: [phone] [email or web address]

## 2022-09-16 NOTE — Plan of Care (Signed)
  Problem: Education: Goal: Knowledge of risk factors and measures for prevention of condition will improve Outcome: Progressing   Problem: Respiratory: Goal: Will maintain a patent airway Outcome: Progressing   Problem: Education: Goal: Knowledge of General Education information will improve Description: Including pain rating scale, medication(s)/side effects and non-pharmacologic comfort measures Outcome: Progressing

## 2022-09-16 NOTE — Plan of Care (Signed)
  Problem: Education: Goal: Knowledge of risk factors and measures for prevention of condition will improve Outcome: Progressing   Problem: Coping: Goal: Psychosocial and spiritual needs will be supported Outcome: Progressing   Problem: Respiratory: Goal: Will maintain a patent airway Outcome: Progressing   

## 2022-09-17 DIAGNOSIS — N179 Acute kidney failure, unspecified: Secondary | ICD-10-CM | POA: Diagnosis not present

## 2022-09-17 LAB — CBC WITH DIFFERENTIAL/PLATELET
Abs Immature Granulocytes: 0.12 10*3/uL — ABNORMAL HIGH (ref 0.00–0.07)
Basophils Absolute: 0 10*3/uL (ref 0.0–0.1)
Basophils Relative: 0 %
Eosinophils Absolute: 0.1 10*3/uL (ref 0.0–0.5)
Eosinophils Relative: 1 %
HCT: 30.5 % — ABNORMAL LOW (ref 36.0–46.0)
Hemoglobin: 10.4 g/dL — ABNORMAL LOW (ref 12.0–15.0)
Immature Granulocytes: 1 %
Lymphocytes Relative: 13 %
Lymphs Abs: 1.1 10*3/uL (ref 0.7–4.0)
MCH: 34.2 pg — ABNORMAL HIGH (ref 26.0–34.0)
MCHC: 34.1 g/dL (ref 30.0–36.0)
MCV: 100.3 fL — ABNORMAL HIGH (ref 80.0–100.0)
Monocytes Absolute: 1.1 10*3/uL — ABNORMAL HIGH (ref 0.1–1.0)
Monocytes Relative: 13 %
Neutro Abs: 5.9 10*3/uL (ref 1.7–7.7)
Neutrophils Relative %: 72 %
Platelets: 170 10*3/uL (ref 150–400)
RBC: 3.04 MIL/uL — ABNORMAL LOW (ref 3.87–5.11)
RDW: 14.6 % (ref 11.5–15.5)
WBC: 8.4 10*3/uL (ref 4.0–10.5)
nRBC: 0 % (ref 0.0–0.2)

## 2022-09-17 LAB — BASIC METABOLIC PANEL
Anion gap: 9 (ref 5–15)
BUN: 12 mg/dL (ref 8–23)
CO2: 23 mmol/L (ref 22–32)
Calcium: 7.1 mg/dL — ABNORMAL LOW (ref 8.9–10.3)
Chloride: 104 mmol/L (ref 98–111)
Creatinine, Ser: 0.7 mg/dL (ref 0.44–1.00)
GFR, Estimated: >60 mL/min (ref >60)
Glucose, Bld: 139 mg/dL — ABNORMAL HIGH (ref 70–99)
Potassium: 4.3 mmol/L (ref 3.5–5.1)
Sodium: 136 mmol/L (ref 135–145)

## 2022-09-17 MED ORDER — CHOLESTYRAMINE LIGHT 4 G PO PACK: 4.0000 g | PACK | Freq: Three times a day (TID) | ORAL | 0 refills | Status: DC

## 2022-09-17 MED ORDER — CARVEDILOL 6.25 MG PO TABS: 3.1250 mg | ORAL_TABLET | Freq: Two times a day (BID) | ORAL | 2 refills | Status: DC

## 2022-09-17 MED ORDER — ENSURE ENLIVE PO LIQD: 237.0000 mL | Freq: Two times a day (BID) | ORAL | 12 refills | Status: DC

## 2022-09-17 MED ORDER — SODIUM BICARBONATE 650 MG PO TABS: 650.0000 mg | ORAL_TABLET | Freq: Three times a day (TID) | ORAL | 0 refills | Status: AC

## 2022-09-17 NOTE — Progress Notes (Signed)
Report given to RN at [facility]. IV removed. [facility] here for transport.

## 2022-09-17 NOTE — TOC Transition Note (Signed)
Transition of Care [facility]) - CM/SW Discharge Note   Patient Details  Name: Brenda [name] MRN: [id number] Date of Birth: 1940-11-13  Transition [facility] CM/SW Contact:  Larrie Kass, LCSW Phone Number: 09/17/2022, 10:52 AM   Clinical Narrative:    CSW spoke with pt's husband he has agreed for pt to d/c to [facility]. Pt has 20 day benefit for rehab services. Pt's husband is requesting EMS transport. Pt will d/c to [facility] room 401, call report [phone]. PTAR called no further need , TOC sign off.    Final next level of care: Skilled Nursing Facility Barriers to Discharge: No Barriers Identified   Patient Goals and CMS Choice CMS Medicare.gov Compare Post Acute Care list provided to:: Patient Represenative (must comment) Choice offered to / list presented to : Adult Children  Discharge Placement                    Name of family member notified: Joles,David A (Spouse) Patient and family notified of of transfer: 09/17/22  Discharge Plan and Services Additional resources added to the After Visit Summary for                                       Social Determinants of Health (SDOH) Interventions SDOH Screenings   Food Insecurity: No Food Insecurity (09/13/2022)  Housing: Low Risk  (09/13/2022)  Transportation Needs: No Transportation Needs (09/13/2022)  Utilities: Not At Risk (09/13/2022)  Tobacco Use: Medium Risk (09/13/2022)     Readmission Risk Interventions     No data to display

## 2022-09-17 NOTE — Discharge Summary (Signed)
Physician Discharge Summary  [name] VZD:[id number] DOB: 12/31/1940 DOA: 09/13/2022  PCP: Clovis Riley, L.August Saucer, MD  Admit date: 09/13/2022 Discharge date: 09/17/2022  Admitted From: Independent living facility Discharge disposition: Back to independent living facility with home health  Recommendations at discharge:  Continue Questran schedule and Imodium as needed.  Monitor diarrheal frequency and can gradually come off meds. Sodium bicarb 650 mg 3 times daily for 5 days Lasix and lisinopril have been stopped.  You been started on carvedilol 6.25 mg twice daily    Brief narrative: Brenda [name] is a 82 y.o. female with PMH significant for HTN, HLD, chronic diarrhea, anxiety, migraine, hiatal hernia, coarse tremor, chronic diarrhea, weight loss who is a long-term resident at [facility] independent facility 4 days prior, patient tested positive for COVID at the facility and was started on Paxlovid. 8/26, patient was sent to the ED with complaint of abdominal pain, nausea, vomiting and diarrhea.  Since the diagnosis of COVID, patient has had poor appetite, episodes of lightheadedness and poor urine output.  In the ED, patient was afebrile, hemodynamically stable, breathing room air Initial labs with WC count 19,000, sodium 123, potassium 2.1, bicarb low at 13, BUN/creatinine elevated to 74/6.62, anion gap more than 20, lipase elevated to 118, lactic acid level normal Urinalysis clear yellow urine with small amount of hemoglobin, rare bacteria Blood culture sent Chest x-ray unremarkable  Renal ultrasound  unremarkable. CT chest abdomen pelvis showed gas fluid levels throughout the colon to the level of the rectum, consistent with history of diarrhea. No bowel obstruction or ileus.  Patient was given IV fluid, potassium replacement Admitted to [facility] Nephrologist consulted  Subjective: Patient was seen and examined this morning. Lying on bed.  Husband at bedside.  Patient feels  better.  Diarrhea has slowed down. Patient husband are both agreeable with the discharge plan today.  Hospital course: Acute worsening of chronic diarrhea  Patient states that she had been struggling with diarrhea for the last several months after a trip to [place].  In the past, she was seen by GI Dr. Dulce Sellar, and tried on Imodium with partial relief.  She was maintained on budesonide 3 mg daily. Since the diagnosis of COVID-19 infection, diarrhea has worsened. CT abdomen with fluid-filled colon consistent with diarrhea 8/27, started on scheduled Imodium and Questran.  In the span of neck 72 hours, patient felt much better.  Adequately hydrated. Plan to discharge on scheduled Questran and as needed Imodium.  I have advised patient and family that she may gradually be able to come off Questran as well.  Severe hypokalemia Potassium level was significantly low.  Trend as below.  Improved with aggressive repletion. Since she is no longer having diarrhea, I would not put her on scheduled potassium at discharge. Recent Labs  Lab 09/13/22 1907 09/14/22 0019 09/14/22 0858 09/14/22 1708 09/15/22 0427 09/16/22 0426 09/17/22 0419  K <2.0*   < > 2.9* 4.2 3.4* 3.5 4.3  MG 2.0  --   --   --   --   --   --    < > = values in this interval not displayed.   AKI (acute kidney injury) Acute anion gap metabolic acidosis Prerenal secondary to vomiting and poor intake Nephrology consult appreciated. Creatinine and bicarbonate level significantly improved with hydration. Per nephrology recommendation, plan to continue sodium bicarb 650 mg 3 times daily at discharge.  Diarrhea has improved.  I would give a limited supply of 5 days of sodium  bicarb.  Recent Labs    07/17/22 1043 09/13/22 1330 09/13/22 1907 09/14/22 0019 09/14/22 0701 09/14/22 0858 09/14/22 1708 09/15/22 0427 09/16/22 0426 09/17/22 0419  BUN 10 74* 70* 69* 61* 61* 51* 39* 21 12  CREATININE 0.90 6.62* 5.66* 4.91* 3.86* 3.33*  2.26* 1.34* 0.78 0.70  CO2 25 13* 10* 10* 9* 11* 9* 14* 20* 23   COVID-19 virus infection  Emphysema Received 4 days of Paxlovid prior to presentation. Respiratory status stable. Currently breathing on room air Bronchodilators as needed  Essential hypertension PTA on Lasix 20 mg daily, lisinopril 10 mg daily Currently on hold due to AKI.  Blood pressure was initially low slowly started improving.  Patient was also noted to be tachycardic.  She has been started on Coreg.  I will discharge her on Coreg 6.25 mg twice daily.  I would avoid Lasix and lisinopril at this time.   Aortic Atherosclerosis HLD Continue Lipitor  GERD s/p Nissen fundoplication - 2017 Continue pantoprazole 40 mg p.o. daily.   Pressure injury of skin Continue local care.   Essential tremor PTA on primidone 100 mg p.o. 3 times daily.  Same regimen resumed.  Anxiety/depression PTA on Cymbalta 20 mg daily, Xanax 0.25 mg daily as needed  Cholelithiasis  without evidence of acute cholecystitis  Chronic vertebral fractures  CT scan showed chronic appearing mild anterior wedge compression deformities at T1 and T3. Chronic L2 wedge compression deformity with prior vertebral augmentation. Pain management as needed.  Impaired mobility Seen by PT.  Home with PT recommended.  Goals of care   Code Status: Full Code   Wounds:  - Pressure Injury 09/13/22 Sacrum Mid Stage 1 -  Intact skin with non-blanchable redness of a localized area usually over a bony prominence. (Active)  Date First Assessed/Time First Assessed: 09/13/22 1819   Location: Sacrum  Location Orientation: Mid  Staging: Stage 1 -  Intact skin with non-blanchable redness of a localized area usually over a bony prominence.  Present on Admission: Yes    Assessments 09/13/2022  3:00 PM 09/16/2022  8:30 PM  Dressing Type Foam - Lift dressing to assess site every shift Foam - Lift dressing to assess site every shift  Dressing -- Clean, Dry, Intact   Dressing Change Frequency -- PRN  Site / Wound Assessment -- Clean;Dry;Pink  Peri-wound Assessment Intact Intact  Wound Length (cm) 2 cm --  Wound Width (cm) 3 cm --  Wound Depth (cm) 0 cm --  Wound Surface Area (cm^2) 6 cm^2 --  Wound Volume (cm^3) 0 cm^3 --     No associated orders.    Discharge Exam:   Vitals:   09/16/22 1307 09/16/22 1456 09/16/22 1924 09/17/22 0539  BP: (!) 151/91 (!) 153/79 (!) 141/80 133/84  Pulse: (!) 116  (!) 104 (!) 107  Resp: 17  18 20   Temp: 99 F (37.2 C)  98.6 F (37 C) 98.9 F (37.2 C)  TempSrc: Oral  Oral Oral  SpO2: 94%  95% 93%  Weight:      Height:        Body mass index is 17.41 kg/m.   General exam: Pleasant, elderly Caucasian female.  Not in distress.  Thin built Skin: No rashes, lesions or ulcers. HEENT: Atraumatic, normocephalic, no obvious bleeding Lungs: Clear to auscultation bilaterally CVS: Regular rate and rhythm, no murmur GI/Abd soft, nontender, nondistended, bowel sound present CNS: Alert, awake, oriented x 3.  Has chronic resting tremors Psychiatry: Mood appropriate Extremities: No pedal edema,  no calf tenderness  Follow ups:    Follow-up Information     Clovis Riley, L.August Saucer, MD Follow up.   Specialty: Family Medicine Contact information: [address] [place] [address] [phone]                 Discharge Instructions:   Discharge Instructions     Call MD for:  difficulty breathing, headache or visual disturbances   Complete by: As directed    Call MD for:  extreme fatigue   Complete by: As directed    Call MD for:  hives   Complete by: As directed    Call MD for:  persistant dizziness or light-headedness   Complete by: As directed    Call MD for:  persistant nausea and vomiting   Complete by: As directed    Call MD for:  severe uncontrolled pain   Complete by: As directed    Call MD for:  temperature >100.4   Complete by: As directed    Diet general   Complete by: As  directed    Discharge instructions   Complete by: As directed    Recommendations at discharge:   Continue Questran schedule and Imodium as needed.  Monitor diarrheal frequency and can gradually come off meds.  Sodium bicarb 650 mg 3 times daily for 5 days  Lasix and lisinopril have been stopped.  You been started on carvedilol 6.25 mg twice daily  General discharge instructions: Follow with Primary MD Clovis Riley, L.August Saucer, MD in 7 days  Please request your PCP  to go over your hospital tests, procedures, radiology results at the follow up. Please get your medicines reviewed and adjusted.  Your PCP may decide to repeat certain labs or tests as needed. Do not drive, operate heavy machinery, perform activities at heights, swimming or participation in water activities or provide baby sitting services if your were admitted for syncope or siezures until you have seen by Primary MD or a Neurologist and advised to do so again. [place] Controlled Substance Reporting System database was reviewed. Do not drive, operate heavy machinery, perform activities at heights, swim, participate in water activities or provide baby-sitting services while on medications for pain, sleep and mood until your outpatient physician has reevaluated you and advised to do so again.  You are strongly recommended to comply with the dose, frequency and duration of prescribed medications. Activity: As tolerated with Full fall precautions use walker/cane & assistance as needed Avoid using any recreational substances like cigarette, tobacco, alcohol, or non-prescribed drug. If you experience worsening of your admission symptoms, develop shortness of breath, life threatening emergency, suicidal or homicidal thoughts you must seek medical attention immediately by calling 911 or calling your MD immediately  if symptoms less severe. You must read complete instructions/literature along with all the possible adverse reactions/side effects  for all the medicines you take and that have been prescribed to you. Take any new medicine only after you have completely understood and accepted all the possible adverse reactions/side effects.  Wear Seat belts while driving. You were cared for by a hospitalist during your hospital stay. If you have any questions about your discharge medications or the care you received while you were in the hospital after you are discharged, you can call the unit and ask to speak with the hospitalist or the covering physician. Once you are discharged, your primary care physician will handle any further medical issues. Please note that NO REFILLS for any discharge medications  will be authorized once you are discharged, as it is imperative that you return to your primary care physician (or establish a relationship with a primary care physician if you do not have one).   Discharge wound care:   Complete by: As directed    Increase activity slowly   Complete by: As directed        Discharge Medications:   Allergies as of 09/17/2022       Reactions   Macrodantin [nitrofurantoin Macrocrystal] Shortness Of Breath, Itching, Nausea And Vomiting, Swelling, Rash, Other (See Comments)   Tongue swelling   Topamax [topiramate] Other (See Comments)   Loss of taste, fatigue!!!!!!   Diclofenac Other (See Comments)   Stomach burning   Tramadol Other (See Comments)   Stomach burning   Rice Nausea And Vomiting   Rosemary Oil Nausea And Vomiting   Codeine Nausea And Vomiting        Medication List     STOP taking these medications    furosemide 20 MG tablet Commonly known as: LASIX   lisinopril 10 MG tablet Commonly known as: ZESTRIL   meloxicam 7.5 MG tablet Commonly known as: MOBIC   Paxlovid (300/100) 20 x 150 MG & 10 x 100MG  Tbpk Generic drug: nirmatrelvir & ritonavir       TAKE these medications    albuterol 108 (90 Base) MCG/ACT inhaler Commonly known as: VENTOLIN HFA Inhale 2 puffs into the  lungs every 4 (four) hours as needed for shortness of breath or wheezing.   ALPRAZolam 0.25 MG tablet Commonly known as: XANAX Take 1 tablet (0.25 mg total) by mouth daily as needed (tremors).   atorvastatin 20 MG tablet Commonly known as: LIPITOR Take 20 mg by mouth daily.   budesonide 3 MG 24 hr capsule Commonly known as: ENTOCORT EC Take 9 mg by mouth in the morning.   carvedilol 6.25 MG tablet Commonly known as: COREG Take 0.5 tablets (3.125 mg total) by mouth 2 (two) times daily with a meal.   cholestyramine light 4 g packet Commonly known as: PREVALITE Take 1 packet (4 g total) by mouth 3 (three) times daily.   cyclobenzaprine 5 MG tablet Commonly known as: FLEXERIL Take 5 mg by mouth as needed for muscle spasms (may repeat after 3-4 hours; max of 4 tablets in a day).   DULoxetine 20 MG capsule Commonly known as: CYMBALTA Take 20 mg by mouth daily.   feeding supplement Liqd Take 237 mLs by mouth 2 (two) times daily between meals.   loperamide 2 MG tablet Commonly known as: IMODIUM A-D Take 4-6 mg by mouth See admin instructions. Take 6 mg by mouth in the morning and an additional 4 mg with each subsequent loose stool- OR AS OTHERWISE INSTRUCTED   pantoprazole 40 MG tablet Commonly known as: PROTONIX Take 1 tablet (40 mg total) by mouth daily. Follow up with gastroenterology   primidone 50 MG tablet Commonly known as: MYSOLINE Take 2 tablets (100 mg total) by mouth 3 (three) times daily. Must keep follow up 07/07/2020 for ongoing refills What changed:  when to take this additional instructions   sodium bicarbonate 650 MG tablet Take 1 tablet (650 mg total) by mouth 3 (three) times daily for 5 days.   Vitamin D (Ergocalciferol) 1.25 MG (50000 UNIT) Caps capsule Commonly known as: DRISDOL Take 50,000 Units by mouth every Monday.               Discharge Care Instructions  (From admission, onward)  Start     Ordered   09/17/22 0000   Discharge wound care:        09/17/22 1146             The results of significant diagnostics from this hospitalization (including imaging, microbiology, ancillary and laboratory) are listed below for reference.    Procedures and Diagnostic Studies:   CT CHEST ABDOMEN PELVIS WO CONTRAST  Result Date: 09/13/2022 CLINICAL DATA:  Abdominal pain, nausea/vomiting/diarrhea, COVID positive, sepsis EXAM: CT CHEST, ABDOMEN AND PELVIS WITHOUT CONTRAST TECHNIQUE: Multidetector CT imaging of the chest, abdomen and pelvis was performed following the standard protocol without IV contrast. Evaluation of the soft tissues, solid viscera, vascular structures is limited without IV contrast. RADIATION DOSE REDUCTION: This exam was performed according to the departmental dose-optimization program which includes automated exposure control, adjustment of the mA and/or kV according to patient size and/or use of iterative reconstruction technique. COMPARISON:  09/13/2022, 07/08/2022 FINDINGS: CT CHEST FINDINGS Cardiovascular: Unenhanced imaging of the heart is unremarkable without pericardial effusion. Atherosclerosis of the aorta and coronary vasculature. Evaluation of the vascular lumen is limited without IV contrast. Mediastinum/Nodes: No enlarged mediastinal, hilar, or axillary lymph nodes. Thyroid gland, trachea, and esophagus demonstrate no significant findings. Stable moderate hiatal hernia. Lungs/Pleura: Upper lobe predominant emphysema. No acute airspace disease, effusion, or pneumothorax. Central airways are patent. Musculoskeletal: Mild chronic appearing anterior wedge compression deformities of T1 and T3. Prior healed right inferior rib fractures. Reconstructed images demonstrate no additional findings. CT ABDOMEN PELVIS FINDINGS Hepatobiliary: Small calcified gallstones without evidence of acute cholecystitis. Unremarkable unenhanced appearance of the liver. Pancreas: Unremarkable unenhanced appearance. Spleen:  Unremarkable unenhanced appearance. Adrenals/Urinary Tract: No urinary tract calculi or obstructive uropathy within either kidney. The adrenals and bladder are unremarkable. Stomach/Bowel: No bowel obstruction or ileus. There gas fluid levels throughout the colon to the level of the rectum, compatible with given history of diarrhea. No bowel wall thickening or inflammatory change. Moderate hiatal hernia. Vascular/Lymphatic: Aortic atherosclerosis. No enlarged abdominal or pelvic lymph nodes. Reproductive: Uterus and bilateral adnexa are unremarkable. Other: No free fluid or free intraperitoneal gas. No abdominal wall hernia. Musculoskeletal: Chronic L2 wedge compression deformity with prior vertebral augmentation. No acute displaced fracture. Reconstructed images demonstrate no additional findings. IMPRESSION: 1. Gas fluid levels throughout the colon to the level of the rectum, consistent with history of diarrhea. No bowel obstruction or ileus. 2. Cholelithiasis without evidence of acute cholecystitis. 3. Moderate hiatal hernia. 4. Aortic Atherosclerosis (ICD10-I70.0) and Emphysema (ICD10-J43.9). 5. Chronic appearing mild anterior wedge compression deformities at T1 and T3. Chronic L2 wedge compression deformity with prior vertebral augmentation. Electronically Signed   By: Sharlet Salina M.D.   On: 09/13/2022 17:42   US Renal  Result Date: 09/13/2022 CLINICAL DATA:  Renal failure.  Patient is COVID positive. EXAM: RENAL / URINARY TRACT ULTRASOUND COMPLETE COMPARISON:  CT scan abdomen and pelvis from 07/08/2022. FINDINGS: Right Kidney: Renal measurements: 3.7 x 3.9 x 8.0 cm. = volume: 61.1 mL. Echogenicity within normal limits. No mass or hydronephrosis visualized. Left Kidney: Renal measurements: 4.4 x 4.7 x 8.8 cm. = volume: 96.0 mL. Echogenicity within normal limits. No mass or hydronephrosis visualized. Bladder: Appears normal for degree of bladder distention. Other: None. IMPRESSION: *Normal renal sonogram.  Electronically Signed   By: Jules Schick M.D.   On: 09/13/2022 16:24   DG Chest Portable 1 View  Result Date: 09/13/2022 CLINICAL DATA:  COVID diagnosis with abdominal pain, nausea, vomiting, and diarrhea EXAM: PORTABLE CHEST 1  VIEW COMPARISON:  Chest radiograph dated 07/08/2022 FINDINGS: Low lung volumes. No focal consolidations. No pleural effusion or pneumothorax. The heart size and mediastinal contours are within normal limits. No acute osseous abnormality. IMPRESSION: Low lung volumes without focal consolidation. Electronically Signed   By: Agustin Cree M.D.   On: 09/13/2022 14:05     Labs:   Basic Metabolic Panel: Recent Labs  Lab 09/13/22 1907 09/14/22 0019 09/14/22 0858 09/14/22 1708 09/15/22 0427 09/16/22 0426 09/17/22 0419  NA 124*   < > 129* 129* 133* 132* 136  K <2.0*   < > 2.9* 4.2 3.4* 3.5 4.3  CL 94*   < > 106 109 106 103 104  CO2 10*   < > 11* 9* 14* 20* 23  GLUCOSE 77   < > 119* 139* 199* 127* 139*  BUN 70*   < > 61* 51* 39* 21 12  CREATININE 5.66*   < > 3.33* 2.26* 1.34* 0.78 0.70  CALCIUM 6.7*   < > 6.9* 6.6* 6.9* 6.3* 7.1*  MG 2.0  --   --   --   --   --   --    < > = values in this interval not displayed.   GFR Estimated Creatinine Clearance: 40.7 mL/min (by C-G formula based on SCr of 0.7 mg/dL). Liver Function Tests: Recent Labs  Lab 09/13/22 1330 09/14/22 0019  AST 31 27  ALT 24 22  ALKPHOS 120 108  BILITOT 0.7 0.9  PROT 6.7 6.0*  ALBUMIN 2.9* 2.6*   Recent Labs  Lab 09/13/22 1330  LIPASE 118*   No results for input(s): "AMMONIA" in the last 168 hours. Coagulation profile No results for input(s): "INR", "PROTIME" in the last 168 hours.  CBC: Recent Labs  Lab 09/13/22 1330 09/14/22 0019 09/15/22 0427 09/16/22 0426 09/17/22 0419  WBC 19.0* 14.5* 7.9 8.0 8.4  NEUTROABS  --   --  6.5 5.7 5.9  HGB 12.7 12.2 10.9* 10.1* 10.4*  HCT 35.3* 34.6* 31.3* 29.1* 30.5*  MCV 94.1 95.3 96.9 96.7 100.3*  PLT 245 207 215 185 170   Cardiac  Enzymes: No results for input(s): "CKTOTAL", "CKMB", "CKMBINDEX", "TROPONINI" in the last 168 hours. BNP: Invalid input(s): "POCBNP" CBG: No results for input(s): "GLUCAP" in the last 168 hours. D-Dimer No results for input(s): "DDIMER" in the last 72 hours. Hgb A1c No results for input(s): "HGBA1C" in the last 72 hours. Lipid Profile No results for input(s): "CHOL", "HDL", "LDLCALC", "TRIG", "CHOLHDL", "LDLDIRECT" in the last 72 hours. Thyroid function studies No results for input(s): "TSH", "T4TOTAL", "T3FREE", "THYROIDAB" in the last 72 hours.  Invalid input(s): "FREET3" Anemia work up No results for input(s): "VITAMINB12", "FOLATE", "FERRITIN", "TIBC", "IRON", "RETICCTPCT" in the last 72 hours. Microbiology Recent Results (from the past 240 hour(s))  Blood culture (routine x 2)     Status: None (Preliminary result)   Collection Time: 09/13/22  8:42 PM   Specimen: BLOOD LEFT HAND  Result Value Ref Range Status   Specimen Description BLOOD LEFT HAND  Final   Special Requests   Final    BOTTLES DRAWN AEROBIC ONLY Blood Culture results may not be optimal due to an inadequate volume of blood received in culture bottles   Culture   Final    NO GROWTH 4 DAYS Performed at [facility], [address] [place], [place] [address]    Report Status PENDING  Incomplete  Culture, blood (Routine X 2) w Reflex to ID Panel  Status: None (Preliminary result)   Collection Time: 09/14/22 12:45 PM   Specimen: BLOOD  Result Value Ref Range Status   Specimen Description   Final    BLOOD BLOOD RIGHT ARM AEROBIC BOTTLE ONLY ANAEROBIC BOTTLE ONLY Performed at [facility], [address], [place], [place] [address]    Special Requests   Final    BOTTLES DRAWN AEROBIC AND ANAEROBIC Blood Culture adequate volume Performed at [facility], [address] [place], [place] [address]    Culture   Final    NO GROWTH 3 DAYS Performed at [facility], [address] [place], [place] [address]    Report Status PENDING  Incomplete    Time coordinating discharge: 45 minutes  Signed: Kialee Kham  [facility] 09/17/2022, 11:46 AM

## 2022-09-18 LAB — CULTURE, BLOOD (ROUTINE X 2): Culture: NO GROWTH

## 2022-09-19 LAB — CULTURE, BLOOD (ROUTINE X 2)
Culture: NO GROWTH
Special Requests: ADEQUATE

## 2022-09-21 DIAGNOSIS — R2689 Other abnormalities of gait and mobility: Secondary | ICD-10-CM | POA: Diagnosis not present

## 2022-09-21 DIAGNOSIS — M6281 Muscle weakness (generalized): Secondary | ICD-10-CM | POA: Diagnosis not present

## 2022-09-21 DIAGNOSIS — U071 COVID-19: Secondary | ICD-10-CM | POA: Diagnosis not present

## 2022-09-22 ENCOUNTER — Emergency Department (HOSPITAL_COMMUNITY): Payer: Medicare PPO

## 2022-09-22 ENCOUNTER — Inpatient Hospital Stay (HOSPITAL_COMMUNITY): Payer: Medicare PPO

## 2022-09-22 ENCOUNTER — Inpatient Hospital Stay (HOSPITAL_COMMUNITY)
Admission: EM | Admit: 2022-09-22 | Discharge: 2022-10-19 | DRG: 308 | Disposition: E | Payer: Medicare PPO | Source: Skilled Nursing Facility | Attending: Critical Care Medicine | Admitting: Critical Care Medicine

## 2022-09-22 ENCOUNTER — Encounter (HOSPITAL_COMMUNITY): Payer: Self-pay

## 2022-09-22 ENCOUNTER — Other Ambulatory Visit: Payer: Self-pay

## 2022-09-22 DIAGNOSIS — Z681 Body mass index (BMI) 19 or less, adult: Secondary | ICD-10-CM

## 2022-09-22 DIAGNOSIS — I4721 Torsades de pointes: Secondary | ICD-10-CM | POA: Diagnosis present

## 2022-09-22 DIAGNOSIS — R570 Cardiogenic shock: Secondary | ICD-10-CM | POA: Diagnosis present

## 2022-09-22 DIAGNOSIS — I2510 Atherosclerotic heart disease of native coronary artery without angina pectoris: Secondary | ICD-10-CM | POA: Diagnosis not present

## 2022-09-22 DIAGNOSIS — R0602 Shortness of breath: Secondary | ICD-10-CM | POA: Diagnosis not present

## 2022-09-22 DIAGNOSIS — D689 Coagulation defect, unspecified: Secondary | ICD-10-CM | POA: Diagnosis present

## 2022-09-22 DIAGNOSIS — R0989 Other specified symptoms and signs involving the circulatory and respiratory systems: Secondary | ICD-10-CM | POA: Diagnosis not present

## 2022-09-22 DIAGNOSIS — E43 Unspecified severe protein-calorie malnutrition: Secondary | ICD-10-CM | POA: Diagnosis not present

## 2022-09-22 DIAGNOSIS — I517 Cardiomegaly: Secondary | ICD-10-CM | POA: Diagnosis not present

## 2022-09-22 DIAGNOSIS — U071 COVID-19: Secondary | ICD-10-CM | POA: Diagnosis present

## 2022-09-22 DIAGNOSIS — I509 Heart failure, unspecified: Secondary | ICD-10-CM | POA: Diagnosis not present

## 2022-09-22 DIAGNOSIS — I5181 Takotsubo syndrome: Secondary | ICD-10-CM | POA: Diagnosis present

## 2022-09-22 DIAGNOSIS — J9811 Atelectasis: Secondary | ICD-10-CM | POA: Diagnosis not present

## 2022-09-22 DIAGNOSIS — R748 Abnormal levels of other serum enzymes: Secondary | ICD-10-CM | POA: Diagnosis present

## 2022-09-22 DIAGNOSIS — J9690 Respiratory failure, unspecified, unspecified whether with hypoxia or hypercapnia: Secondary | ICD-10-CM | POA: Diagnosis not present

## 2022-09-22 DIAGNOSIS — E1165 Type 2 diabetes mellitus with hyperglycemia: Secondary | ICD-10-CM | POA: Diagnosis present

## 2022-09-22 DIAGNOSIS — J1282 Pneumonia due to coronavirus disease 2019: Secondary | ICD-10-CM | POA: Diagnosis present

## 2022-09-22 DIAGNOSIS — I6782 Cerebral ischemia: Secondary | ICD-10-CM | POA: Diagnosis not present

## 2022-09-22 DIAGNOSIS — E876 Hypokalemia: Secondary | ICD-10-CM | POA: Diagnosis present

## 2022-09-22 DIAGNOSIS — J439 Emphysema, unspecified: Secondary | ICD-10-CM | POA: Diagnosis not present

## 2022-09-22 DIAGNOSIS — Z515 Encounter for palliative care: Secondary | ICD-10-CM | POA: Diagnosis not present

## 2022-09-22 DIAGNOSIS — Z66 Do not resuscitate: Secondary | ICD-10-CM | POA: Diagnosis present

## 2022-09-22 DIAGNOSIS — E872 Acidosis, unspecified: Secondary | ICD-10-CM | POA: Diagnosis present

## 2022-09-22 DIAGNOSIS — I469 Cardiac arrest, cause unspecified: Secondary | ICD-10-CM

## 2022-09-22 DIAGNOSIS — Z825 Family history of asthma and other chronic lower respiratory diseases: Secondary | ICD-10-CM

## 2022-09-22 DIAGNOSIS — I959 Hypotension, unspecified: Secondary | ICD-10-CM | POA: Diagnosis not present

## 2022-09-22 DIAGNOSIS — F419 Anxiety disorder, unspecified: Secondary | ICD-10-CM | POA: Diagnosis present

## 2022-09-22 DIAGNOSIS — R0689 Other abnormalities of breathing: Secondary | ICD-10-CM | POA: Diagnosis not present

## 2022-09-22 DIAGNOSIS — J690 Pneumonitis due to inhalation of food and vomit: Secondary | ICD-10-CM | POA: Diagnosis present

## 2022-09-22 DIAGNOSIS — J810 Acute pulmonary edema: Secondary | ICD-10-CM | POA: Diagnosis not present

## 2022-09-22 DIAGNOSIS — Z86010 Personal history of colonic polyps: Secondary | ICD-10-CM

## 2022-09-22 DIAGNOSIS — R918 Other nonspecific abnormal finding of lung field: Secondary | ICD-10-CM | POA: Diagnosis not present

## 2022-09-22 DIAGNOSIS — E871 Hypo-osmolality and hyponatremia: Secondary | ICD-10-CM | POA: Diagnosis present

## 2022-09-22 DIAGNOSIS — R627 Adult failure to thrive: Secondary | ICD-10-CM | POA: Diagnosis present

## 2022-09-22 DIAGNOSIS — Z91018 Allergy to other foods: Secondary | ICD-10-CM

## 2022-09-22 DIAGNOSIS — Z4682 Encounter for fitting and adjustment of non-vascular catheter: Secondary | ICD-10-CM | POA: Diagnosis not present

## 2022-09-22 DIAGNOSIS — Z8249 Family history of ischemic heart disease and other diseases of the circulatory system: Secondary | ICD-10-CM

## 2022-09-22 DIAGNOSIS — I110 Hypertensive heart disease with heart failure: Secondary | ICD-10-CM | POA: Diagnosis present

## 2022-09-22 DIAGNOSIS — D649 Anemia, unspecified: Secondary | ICD-10-CM | POA: Diagnosis present

## 2022-09-22 DIAGNOSIS — Z7189 Other specified counseling: Secondary | ICD-10-CM

## 2022-09-22 DIAGNOSIS — Z87891 Personal history of nicotine dependence: Secondary | ICD-10-CM

## 2022-09-22 DIAGNOSIS — J90 Pleural effusion, not elsewhere classified: Secondary | ICD-10-CM | POA: Diagnosis not present

## 2022-09-22 DIAGNOSIS — D6489 Other specified anemias: Secondary | ICD-10-CM | POA: Diagnosis present

## 2022-09-22 DIAGNOSIS — I4891 Unspecified atrial fibrillation: Secondary | ICD-10-CM | POA: Diagnosis not present

## 2022-09-22 DIAGNOSIS — K529 Noninfective gastroenteritis and colitis, unspecified: Secondary | ICD-10-CM | POA: Diagnosis present

## 2022-09-22 DIAGNOSIS — I2699 Other pulmonary embolism without acute cor pulmonale: Secondary | ICD-10-CM | POA: Diagnosis not present

## 2022-09-22 DIAGNOSIS — R6521 Severe sepsis with septic shock: Secondary | ICD-10-CM | POA: Diagnosis present

## 2022-09-22 DIAGNOSIS — R0902 Hypoxemia: Secondary | ICD-10-CM | POA: Diagnosis not present

## 2022-09-22 DIAGNOSIS — I5021 Acute systolic (congestive) heart failure: Secondary | ICD-10-CM | POA: Diagnosis present

## 2022-09-22 DIAGNOSIS — Z79899 Other long term (current) drug therapy: Secondary | ICD-10-CM

## 2022-09-22 DIAGNOSIS — I4901 Ventricular fibrillation: Secondary | ICD-10-CM | POA: Diagnosis not present

## 2022-09-22 DIAGNOSIS — K219 Gastro-esophageal reflux disease without esophagitis: Secondary | ICD-10-CM | POA: Diagnosis present

## 2022-09-22 DIAGNOSIS — I471 Supraventricular tachycardia, unspecified: Secondary | ICD-10-CM | POA: Diagnosis present

## 2022-09-22 DIAGNOSIS — F32A Depression, unspecified: Secondary | ICD-10-CM | POA: Diagnosis present

## 2022-09-22 DIAGNOSIS — R54 Age-related physical debility: Secondary | ICD-10-CM | POA: Diagnosis present

## 2022-09-22 DIAGNOSIS — E861 Hypovolemia: Secondary | ICD-10-CM | POA: Diagnosis present

## 2022-09-22 DIAGNOSIS — R000 Tachycardia, unspecified: Secondary | ICD-10-CM | POA: Diagnosis not present

## 2022-09-22 DIAGNOSIS — J9601 Acute respiratory failure with hypoxia: Secondary | ICD-10-CM | POA: Diagnosis present

## 2022-09-22 DIAGNOSIS — Z885 Allergy status to narcotic agent status: Secondary | ICD-10-CM

## 2022-09-22 DIAGNOSIS — Z888 Allergy status to other drugs, medicaments and biological substances status: Secondary | ICD-10-CM

## 2022-09-22 DIAGNOSIS — I462 Cardiac arrest due to underlying cardiac condition: Secondary | ICD-10-CM | POA: Diagnosis present

## 2022-09-22 DIAGNOSIS — Z452 Encounter for adjustment and management of vascular access device: Secondary | ICD-10-CM | POA: Diagnosis not present

## 2022-09-22 DIAGNOSIS — G9341 Metabolic encephalopathy: Secondary | ICD-10-CM | POA: Diagnosis present

## 2022-09-22 DIAGNOSIS — J811 Chronic pulmonary edema: Secondary | ICD-10-CM | POA: Diagnosis not present

## 2022-09-22 DIAGNOSIS — Z801 Family history of malignant neoplasm of trachea, bronchus and lung: Secondary | ICD-10-CM

## 2022-09-22 DIAGNOSIS — M810 Age-related osteoporosis without current pathological fracture: Secondary | ICD-10-CM | POA: Diagnosis present

## 2022-09-22 DIAGNOSIS — Z85828 Personal history of other malignant neoplasm of skin: Secondary | ICD-10-CM

## 2022-09-22 DIAGNOSIS — I493 Ventricular premature depolarization: Secondary | ICD-10-CM | POA: Diagnosis present

## 2022-09-22 DIAGNOSIS — E7800 Pure hypercholesterolemia, unspecified: Secondary | ICD-10-CM | POA: Diagnosis present

## 2022-09-22 DIAGNOSIS — S02841A Fracture of lateral orbital wall, right side, initial encounter for closed fracture: Secondary | ICD-10-CM | POA: Diagnosis not present

## 2022-09-22 DIAGNOSIS — K9429 Other complications of gastrostomy: Secondary | ICD-10-CM | POA: Diagnosis not present

## 2022-09-22 LAB — COMPREHENSIVE METABOLIC PANEL
ALT: 12 U/L (ref 0–44)
ALT: 26 U/L (ref 0–44)
AST: 32 U/L (ref 15–41)
AST: 77 U/L — ABNORMAL HIGH (ref 15–41)
Albumin: 1.9 g/dL — ABNORMAL LOW (ref 3.5–5.0)
Albumin: 1.6 g/dL — ABNORMAL LOW (ref 3.5–5.0)
Alkaline Phosphatase: 139 U/L — ABNORMAL HIGH (ref 38–126)
Alkaline Phosphatase: 134 U/L — ABNORMAL HIGH (ref 38–126)
Anion gap: 15 (ref 5–15)
Anion gap: 16 — ABNORMAL HIGH (ref 5–15)
BUN: 6 mg/dL — ABNORMAL LOW (ref 8–23)
BUN: 5 mg/dL — ABNORMAL LOW (ref 8–23)
CO2: 13 mmol/L — ABNORMAL LOW (ref 22–32)
CO2: 15 mmol/L — ABNORMAL LOW (ref 22–32)
Calcium: 5.8 mg/dL — CL (ref 8.9–10.3)
Calcium: 6.7 mg/dL — ABNORMAL LOW (ref 8.9–10.3)
Chloride: 105 mmol/L (ref 98–111)
Chloride: 100 mmol/L (ref 98–111)
Creatinine, Ser: 0.9 mg/dL (ref 0.44–1.00)
Creatinine, Ser: 1.03 mg/dL — ABNORMAL HIGH (ref 0.44–1.00)
GFR, Estimated: >60 mL/min (ref >60)
GFR, Estimated: 55 mL/min — ABNORMAL LOW (ref >60)
Glucose, Bld: 127 mg/dL — ABNORMAL HIGH (ref 70–99)
Glucose, Bld: 278 mg/dL — ABNORMAL HIGH (ref 70–99)
Potassium: 3.1 mmol/L — ABNORMAL LOW (ref 3.5–5.1)
Potassium: 4.1 mmol/L (ref 3.5–5.1)
Sodium: 133 mmol/L — ABNORMAL LOW (ref 135–145)
Sodium: 131 mmol/L — ABNORMAL LOW (ref 135–145)
Total Bilirubin: 1 mg/dL (ref 0.3–1.2)
Total Bilirubin: 0.8 mg/dL (ref 0.3–1.2)
Total Protein: 5 g/dL — ABNORMAL LOW (ref 6.5–8.1)
Total Protein: 4.8 g/dL — ABNORMAL LOW (ref 6.5–8.1)

## 2022-09-22 LAB — I-STAT CG4 LACTIC ACID, ED
Lactic Acid, Venous: 2.9 mmol/L (ref 0.5–1.9)
Lactic Acid, Venous: 8.1 mmol/L (ref 0.5–1.9)

## 2022-09-22 LAB — PROTIME-INR
INR: 1.8 — ABNORMAL HIGH (ref 0.8–1.2)
Prothrombin Time: 21 s — ABNORMAL HIGH (ref 11.4–15.2)

## 2022-09-22 LAB — CBC WITH DIFFERENTIAL/PLATELET
Abs Immature Granulocytes: 0.21 10*3/uL — ABNORMAL HIGH (ref 0.00–0.07)
Basophils Absolute: 0 10*3/uL (ref 0.0–0.1)
Basophils Relative: 0 %
Eosinophils Absolute: 0 10*3/uL (ref 0.0–0.5)
Eosinophils Relative: 0 %
HCT: 31 % — ABNORMAL LOW (ref 36.0–46.0)
Hemoglobin: 10.3 g/dL — ABNORMAL LOW (ref 12.0–15.0)
Immature Granulocytes: 1 %
Lymphocytes Relative: 2 %
Lymphs Abs: 0.3 10*3/uL — ABNORMAL LOW (ref 0.7–4.0)
MCH: 34.2 pg — ABNORMAL HIGH (ref 26.0–34.0)
MCHC: 33.2 g/dL (ref 30.0–36.0)
MCV: 103 fL — ABNORMAL HIGH (ref 80.0–100.0)
Monocytes Absolute: 0.5 10*3/uL (ref 0.1–1.0)
Monocytes Relative: 3 %
Neutro Abs: 17.4 10*3/uL — ABNORMAL HIGH (ref 1.7–7.7)
Neutrophils Relative %: 94 %
Platelets: 218 10*3/uL (ref 150–400)
RBC: 3.01 MIL/uL — ABNORMAL LOW (ref 3.87–5.11)
RDW: 14.1 % (ref 11.5–15.5)
WBC: 18.4 10*3/uL — ABNORMAL HIGH (ref 4.0–10.5)
nRBC: 0 % (ref 0.0–0.2)

## 2022-09-22 LAB — COOXEMETRY PANEL
Carboxyhemoglobin: 0.7 % (ref 0.5–1.5)
Carboxyhemoglobin: 1.4 % (ref 0.5–1.5)
Methemoglobin: <0.7 % (ref 0.0–1.5)
Methemoglobin: 1.3 % (ref 0.0–1.5)
O2 Saturation: 50.6 %
O2 Saturation: 56.9 %
Total hemoglobin: 12.4 g/dL (ref 12.0–16.0)
Total hemoglobin: 7.9 g/dL — ABNORMAL LOW (ref 12.0–16.0)

## 2022-09-22 LAB — POCT I-STAT 7, (LYTES, BLD GAS, ICA,H+H)
Acid-base deficit: 12 mmol/L — ABNORMAL HIGH (ref 0.0–2.0)
Bicarbonate: 11.4 mmol/L — ABNORMAL LOW (ref 20.0–28.0)
Calcium, Ion: 0.99 mmol/L — ABNORMAL LOW (ref 1.15–1.40)
HCT: 32 % — ABNORMAL LOW (ref 36.0–46.0)
Hemoglobin: 10.9 g/dL — ABNORMAL LOW (ref 12.0–15.0)
O2 Saturation: 100 %
Patient temperature: 98.6
Potassium: 4 mmol/L (ref 3.5–5.1)
Sodium: 132 mmol/L — ABNORMAL LOW (ref 135–145)
TCO2: 12 mmol/L — ABNORMAL LOW (ref 22–32)
pCO2 arterial: 20.4 mmHg — ABNORMAL LOW (ref 32–48)
pH, Arterial: 7.356 (ref 7.35–7.45)
pO2, Arterial: 174 mmHg — ABNORMAL HIGH (ref 83–108)

## 2022-09-22 LAB — ECHOCARDIOGRAM COMPLETE
Area-P 1/2: 5.62 cm2
Calc EF: 32 %
Height: 64 in
MV M vel: 5 m/s
MV Peak grad: 100 mmHg
P 1/2 time: 350 ms
Radius: 0.4 cm
S' Lateral: 2.4 cm
Single Plane A2C EF: 35.3 %
Single Plane A4C EF: 27.9 %

## 2022-09-22 LAB — I-STAT VENOUS BLOOD GAS, ED
Acid-base deficit: 22 mmol/L — ABNORMAL HIGH (ref 0.0–2.0)
Bicarbonate: 8.5 mmol/L — ABNORMAL LOW (ref 20.0–28.0)
Calcium, Ion: 0.91 mmol/L — ABNORMAL LOW (ref 1.15–1.40)
HCT: 28 % — ABNORMAL LOW (ref 36.0–46.0)
Hemoglobin: 9.5 g/dL — ABNORMAL LOW (ref 12.0–15.0)
O2 Saturation: 61 %
Potassium: 3.4 mmol/L — ABNORMAL LOW (ref 3.5–5.1)
Sodium: 139 mmol/L (ref 135–145)
TCO2: 10 mmol/L — ABNORMAL LOW (ref 22–32)
pCO2, Ven: 36.5 mmHg — ABNORMAL LOW (ref 44–60)
pH, Ven: 6.973 — CL (ref 7.25–7.43)
pO2, Ven: 48 mmHg — ABNORMAL HIGH (ref 32–45)

## 2022-09-22 LAB — URINALYSIS, W/ REFLEX TO CULTURE (INFECTION SUSPECTED)
Bilirubin Urine: NEGATIVE
Glucose, UA: 50 mg/dL — AB
Hgb urine dipstick: NEGATIVE
Ketones, ur: 20 mg/dL — AB
Leukocytes,Ua: NEGATIVE
Nitrite: NEGATIVE
Protein, ur: 100 mg/dL — AB
Specific Gravity, Urine: >1.046 — ABNORMAL HIGH (ref 1.005–1.030)
pH: 5 (ref 5.0–8.0)

## 2022-09-22 LAB — TROPONIN I (HIGH SENSITIVITY)
Troponin I (High Sensitivity): 1042 ng/L (ref <18)
Troponin I (High Sensitivity): 2130 ng/L (ref <18)
Troponin I (High Sensitivity): 2768 ng/L (ref <18)

## 2022-09-22 LAB — MAGNESIUM
Magnesium: 0.9 mg/dL — CL (ref 1.7–2.4)
Magnesium: 2.5 mg/dL — ABNORMAL HIGH (ref 1.7–2.4)

## 2022-09-22 LAB — SARS CORONAVIRUS 2 BY RT PCR: SARS Coronavirus 2 by RT PCR: POSITIVE — AB

## 2022-09-22 LAB — GLUCOSE, CAPILLARY
Glucose-Capillary: 211 mg/dL — ABNORMAL HIGH (ref 70–99)
Glucose-Capillary: 192 mg/dL — ABNORMAL HIGH (ref 70–99)

## 2022-09-22 LAB — D-DIMER, QUANTITATIVE: D-Dimer, Quant: 5.11 ug{FEU}/mL — ABNORMAL HIGH (ref 0.00–0.50)

## 2022-09-22 LAB — PROCALCITONIN: Procalcitonin: 8.13 ng/mL

## 2022-09-22 LAB — ETHANOL: Alcohol, Ethyl (B): <10 mg/dL (ref <10)

## 2022-09-22 LAB — LACTIC ACID, PLASMA: Lactic Acid, Venous: 2.6 mmol/L (ref 0.5–1.9)

## 2022-09-22 LAB — VITAMIN B12: Vitamin B-12: 945 pg/mL — ABNORMAL HIGH (ref 180–914)

## 2022-09-22 LAB — AMMONIA: Ammonia: 23 umol/L (ref 9–35)

## 2022-09-22 LAB — MRSA NEXT GEN BY PCR, NASAL: MRSA by PCR Next Gen: NOT DETECTED

## 2022-09-22 LAB — BRAIN NATRIURETIC PEPTIDE: B Natriuretic Peptide: 3004.2 pg/mL — ABNORMAL HIGH (ref 0.0–100.0)

## 2022-09-22 LAB — PHOSPHORUS: Phosphorus: 2.6 mg/dL (ref 2.5–4.6)

## 2022-09-22 MED ORDER — SODIUM CHLORIDE 0.9 % IV BOLUS
500.0000 mL | Freq: Once | INTRAVENOUS | Status: AC
  Administered 2022-09-22: 500 mL via INTRAVENOUS

## 2022-09-22 MED ORDER — HEPARIN SODIUM (PORCINE) 5000 UNIT/ML IJ SOLN
5000.0000 [IU] | Freq: Three times a day (TID) | INTRAMUSCULAR | Status: DC
  Administered 2022-09-22 – 2022-09-23 (×2): 5000 [IU] via SUBCUTANEOUS
  Filled 2022-09-22 (×2): qty 1

## 2022-09-22 MED ORDER — FENTANYL CITRATE PF 50 MCG/ML IJ SOSY: 25.0000 ug | PREFILLED_SYRINGE | INTRAMUSCULAR | Status: DC | PRN

## 2022-09-22 MED ORDER — ETOMIDATE 2 MG/ML IV SOLN
INTRAVENOUS | Status: AC | PRN
Start: 2022-09-22 — End: 2022-09-22
  Administered 2022-09-22: 20 mg via INTRAVENOUS

## 2022-09-22 MED ORDER — ACETAMINOPHEN 325 MG PO TABS
650.0000 mg | ORAL_TABLET | ORAL | Status: DC | PRN
  Administered 2022-09-23: 650 mg via ORAL
  Filled 2022-09-22: qty 2

## 2022-09-22 MED ORDER — EPINEPHRINE 1 MG/10ML IJ SOSY
PREFILLED_SYRINGE | INTRAMUSCULAR | Status: AC | PRN
Start: 2022-09-22 — End: 2022-09-22
  Administered 2022-09-22: 1 mg via INTRAVENOUS

## 2022-09-22 MED ORDER — ASPIRIN 81 MG PO CHEW
81.0000 mg | CHEWABLE_TABLET | Freq: Every day | ORAL | Status: DC
  Administered 2022-09-22: 81 mg
  Filled 2022-09-22: qty 1

## 2022-09-22 MED ORDER — CHLORHEXIDINE GLUCONATE CLOTH 2 % EX PADS
6.0000 | MEDICATED_PAD | Freq: Every day | CUTANEOUS | Status: DC
  Administered 2022-09-23 – 2022-09-24 (×2): 6 via TOPICAL

## 2022-09-22 MED ORDER — ROCURONIUM BROMIDE 50 MG/5ML IV SOLN
INTRAVENOUS | Status: DC | PRN
  Administered 2022-09-22: 80 mg via INTRAVENOUS

## 2022-09-22 MED ORDER — POTASSIUM CHLORIDE 10 MEQ/100ML IV SOLN
10.0000 meq | Freq: Once | INTRAVENOUS | Status: AC
  Administered 2022-09-22: 10 meq via INTRAVENOUS
  Filled 2022-09-22: qty 100

## 2022-09-22 MED ORDER — MAGNESIUM SULFATE 2 GM/50ML IV SOLN
INTRAVENOUS | Status: AC
  Filled 2022-09-22: qty 50

## 2022-09-22 MED ORDER — SODIUM CHLORIDE 0.9 % IV BOLUS
1000.0000 mL | Freq: Once | INTRAVENOUS | Status: AC
  Administered 2022-09-22: 1000 mL via INTRAVENOUS

## 2022-09-22 MED ORDER — CALCIUM GLUCONATE-NACL 1-0.675 GM/50ML-% IV SOLN
1.0000 g | Freq: Once | INTRAVENOUS | Status: AC
  Administered 2022-09-22: 1000 mg via INTRAVENOUS
  Filled 2022-09-22: qty 50

## 2022-09-22 MED ORDER — BUSPIRONE HCL 10 MG PO TABS: 30.0000 mg | ORAL_TABLET | Freq: Three times a day (TID) | ORAL | Status: DC | PRN

## 2022-09-22 MED ORDER — INSULIN ASPART 100 UNIT/ML IJ SOLN
0.0000 [IU] | INTRAMUSCULAR | Status: DC
  Administered 2022-09-22: 2 [IU] via SUBCUTANEOUS

## 2022-09-22 MED ORDER — CALCIUM GLUCONATE-NACL 2-0.675 GM/100ML-% IV SOLN
2.0000 g | Freq: Once | INTRAVENOUS | Status: AC
  Administered 2022-09-22: 2000 mg via INTRAVENOUS
  Filled 2022-09-22: qty 100

## 2022-09-22 MED ORDER — AMIODARONE HCL IN DEXTROSE 360-4.14 MG/200ML-% IV SOLN
60.0000 mg/h | INTRAVENOUS | Status: AC
  Administered 2022-09-22: 60 mg/h via INTRAVENOUS
  Filled 2022-09-22: qty 200

## 2022-09-22 MED ORDER — POTASSIUM CHLORIDE 10 MEQ/100ML IV SOLN
10.0000 meq | INTRAVENOUS | Status: AC
  Administered 2022-09-22 (×2): 10 meq via INTRAVENOUS
  Filled 2022-09-22 (×2): qty 100

## 2022-09-22 MED ORDER — FENTANYL CITRATE PF 50 MCG/ML IJ SOSY
25.0000 ug | PREFILLED_SYRINGE | INTRAMUSCULAR | Status: DC | PRN
  Administered 2022-09-22 (×2): 50 ug via INTRAVENOUS
  Filled 2022-09-22: qty 2
  Filled 2022-09-22: qty 1

## 2022-09-22 MED ORDER — PROPOFOL 1000 MG/100ML IV EMUL
0.0000 ug/kg/min | INTRAVENOUS | Status: DC
  Administered 2022-09-22: 10 ug/kg/min via INTRAVENOUS
  Filled 2022-09-22: qty 100

## 2022-09-22 MED ORDER — NOREPINEPHRINE 4 MG/250ML-% IV SOLN
INTRAVENOUS | Status: AC
  Administered 2022-09-22: 15 ug/kg/min via INTRAVENOUS
  Filled 2022-09-22: qty 250

## 2022-09-22 MED ORDER — PANTOPRAZOLE SODIUM 40 MG IV SOLR
40.0000 mg | Freq: Every day | INTRAVENOUS | Status: DC
  Administered 2022-09-22 – 2022-09-24 (×3): 40 mg via INTRAVENOUS
  Filled 2022-09-22 (×3): qty 10

## 2022-09-22 MED ORDER — MILRINONE LACTATE IN DEXTROSE 20-5 MG/100ML-% IV SOLN
0.1250 ug/kg/min | INTRAVENOUS | Status: DC
  Administered 2022-09-22: 0.125 ug/kg/min via INTRAVENOUS
  Filled 2022-09-22 (×2): qty 100

## 2022-09-22 MED ORDER — ACETAMINOPHEN 650 MG RE SUPP
650.0000 mg | RECTAL | Status: DC | PRN
  Filled 2022-09-22: qty 1

## 2022-09-22 MED ORDER — SODIUM CHLORIDE 0.9 % IV SOLN
1.0000 g | Freq: Once | INTRAVENOUS | Status: AC
  Administered 2022-09-22: 1 g via INTRAVENOUS
  Filled 2022-09-22: qty 10

## 2022-09-22 MED ORDER — VANCOMYCIN HCL IN DEXTROSE 1-5 GM/200ML-% IV SOLN
1000.0000 mg | Freq: Once | INTRAVENOUS | Status: AC
  Administered 2022-09-22: 1000 mg via INTRAVENOUS
  Filled 2022-09-22: qty 200

## 2022-09-22 MED ORDER — ORAL CARE MOUTH RINSE
15.0000 mL | OROMUCOSAL | Status: DC
  Administered 2022-09-22 – 2022-09-23 (×6): 15 mL via OROMUCOSAL

## 2022-09-22 MED ORDER — SODIUM CHLORIDE 0.9 % IV SOLN
2.0000 g | Freq: Two times a day (BID) | INTRAVENOUS | Status: DC
  Administered 2022-09-22 – 2022-09-24 (×4): 2 g via INTRAVENOUS
  Filled 2022-09-22 (×4): qty 12.5

## 2022-09-22 MED ORDER — AMIODARONE HCL IN DEXTROSE 360-4.14 MG/200ML-% IV SOLN
30.0000 mg/h | INTRAVENOUS | Status: DC
  Administered 2022-09-22 – 2022-09-24 (×4): 30 mg/h via INTRAVENOUS
  Filled 2022-09-22 (×4): qty 200

## 2022-09-22 MED ORDER — DEXMEDETOMIDINE HCL IN NACL 400 MCG/100ML IV SOLN: 0.0000 ug/kg/h | INTRAVENOUS | Status: DC

## 2022-09-22 MED ORDER — DOCUSATE SODIUM 50 MG/5ML PO LIQD: 100.0000 mg | Freq: Two times a day (BID) | ORAL | Status: DC | PRN

## 2022-09-22 MED ORDER — AMIODARONE LOAD VIA INFUSION
150.0000 mg | Freq: Once | INTRAVENOUS | Status: AC
  Administered 2022-09-22: 150 mg via INTRAVENOUS
  Filled 2022-09-22: qty 83.34

## 2022-09-22 MED ORDER — SODIUM CHLORIDE 0.9 % IV SOLN: INTRAVENOUS | Status: DC | PRN

## 2022-09-22 MED ORDER — POLYETHYLENE GLYCOL 3350 17 G PO PACK: 17.0000 g | PACK | Freq: Every day | ORAL | Status: DC | PRN

## 2022-09-22 MED ORDER — MAGNESIUM SULFATE 2 GM/50ML IV SOLN
2.0000 g | Freq: Once | INTRAVENOUS | Status: AC
  Administered 2022-09-22: 2 g via INTRAVENOUS
  Filled 2022-09-22: qty 50

## 2022-09-22 MED ORDER — SODIUM BICARBONATE 8.4 % IV SOLN
INTRAVENOUS | Status: AC | PRN
Start: 2022-09-22 — End: 2022-09-22
  Administered 2022-09-22: 100 meq via INTRAVENOUS

## 2022-09-22 MED ORDER — NOREPINEPHRINE 4 MG/250ML-% IV SOLN
0.0000 ug/min | INTRAVENOUS | Status: DC
  Administered 2022-09-22 (×2): 20 ug/min via INTRAVENOUS
  Administered 2022-09-22: 25 ug/min via INTRAVENOUS
  Administered 2022-09-23: 10 ug/min via INTRAVENOUS
  Filled 2022-09-22 (×4): qty 250

## 2022-09-22 MED ORDER — INSULIN ASPART 100 UNIT/ML IJ SOLN
0.0000 [IU] | INTRAMUSCULAR | Status: DC
  Administered 2022-09-22: 2 [IU] via SUBCUTANEOUS
  Administered 2022-09-23 (×2): 1 [IU] via SUBCUTANEOUS
  Administered 2022-09-23: 2 [IU] via SUBCUTANEOUS
  Administered 2022-09-23: 1 [IU] via SUBCUTANEOUS
  Administered 2022-09-23: 2 [IU] via SUBCUTANEOUS
  Administered 2022-09-23 – 2022-09-24 (×2): 1 [IU] via SUBCUTANEOUS

## 2022-09-22 MED ORDER — POTASSIUM CHLORIDE 20 MEQ PO PACK
40.0000 meq | PACK | ORAL | Status: AC
  Administered 2022-09-22: 40 meq
  Filled 2022-09-22: qty 2

## 2022-09-22 MED ORDER — PERFLUTREN LIPID MICROSPHERE
1.0000 mL | INTRAVENOUS | Status: AC | PRN
  Administered 2022-09-22: 3 mL via INTRAVENOUS

## 2022-09-22 MED ORDER — AMIODARONE HCL IN DEXTROSE 360-4.14 MG/200ML-% IV SOLN
INTRAVENOUS | Status: AC
  Administered 2022-09-22: 60 mg/h via INTRAVENOUS
  Filled 2022-09-22: qty 200

## 2022-09-22 MED ORDER — ORAL CARE MOUTH RINSE: 15.0000 mL | OROMUCOSAL | Status: DC | PRN

## 2022-09-22 MED ORDER — LACTATED RINGERS IV SOLN: INTRAVENOUS | Status: DC

## 2022-09-22 MED ORDER — IOHEXOL 350 MG/ML SOLN
75.0000 mL | Freq: Once | INTRAVENOUS | Status: AC | PRN
  Administered 2022-09-22: 75 mL via INTRAVENOUS

## 2022-09-22 MED ORDER — VANCOMYCIN HCL 750 MG/150ML IV SOLN: 750.0000 mg | INTRAVENOUS | Status: DC

## 2022-09-22 MED ORDER — ACETAMINOPHEN 160 MG/5ML PO SOLN: 650.0000 mg | ORAL | Status: DC | PRN

## 2022-09-22 MED ORDER — ALBUTEROL SULFATE (2.5 MG/3ML) 0.083% IN NEBU: 2.5000 mg | INHALATION_SOLUTION | RESPIRATORY_TRACT | Status: DC | PRN

## 2022-09-22 NOTE — ED Triage Notes (Signed)
Pt arrives via EMS from [place]. Pt reports sob since yesterday. Pt was hypoxic in the 80s with EMS. PT was also hypotensive with a bp of 90/50. Pt received on NS with EMS. PT is AxOx4. Pt denies cp or any other symptoms. Pt recently tested positive for covid.

## 2022-09-22 NOTE — H&P (Signed)
NAME:  Brenda [name], MRN:  [id number], DOB:  [birth year], LOS: 0 ADMISSION DATE:  09/22/2022, CONSULTATION DATE:  09/22/2022 REFERRING MD:  Dr. Hyacinth Meeker, CHIEF COMPLAINT:  cardiac arrest   History of Present Illness:   81yoF with PMH of HTN, HLD, anxiety, chronic diarrhea, and tremor presenting from [facility] assisted living for SOB for reported one day, found to be hypoxic and hypotensive with EMS treated with NS bolus.  Patient recently hospitalized in June for AKI, electrolyte derangements, wt loss, and abdominal pain and again 8/26-8/30 with worsening N/V/D after recent COVID dx (? 8/22) with AKI, multiple electrolyte derangements discharged to rehab.    On arrival to ER, pt denied complaints, was alert but slow to answer questions, noted to be afebrile, tachypneic, tachycardic, and normotensive.  CXR showed L> R bibasilar opacities and new small pleural effusion.  Labs with K 3.1, corrected Ca 7.5, bicarb 13, lactic 2.9, WBC 18.4, INR 1.8, treated with calcium gluconate and additional NS bolus for soft BP, improved after bolus. Was being rule out for possible PE given positive D-dimer and hypoxia.  On return from CT, patient developed rapid narrow complex rate then decompensated to vfib arrest.  ROSC obtained after CPR, 2 defibrillations and epi.  Amiodarone bolus and magnesium given.  Shortly after, pt suffered second PEA arrest with ROSC after one round and intubated after RSI.  Post arrest CXR with development of pulmonary edema, satisfactory tube placement.  Requiring norepinephrine for refractory hypotension. PCCM consulted for admit.   Husband at bedside but given he is in AIL and she is in rehab part, unaware of any acute recent events overnight.   Pertinent  Medical History  HTN, HLD, anxiety/ depression, IBS/ chronic diarrhea, GERD, hiatal hernia s/p Nissen fundoplication 2017, tremor, chronic vertebral fractures  Significant Hospital Events: Including procedures, antibiotic start  and stop dates in addition to other pertinent events   9/4 Admit SOB/ cardiac arrest   Interim History / Subjective:   Objective   Blood pressure 129/74, pulse (!) 118, temperature 98.4 F (36.9 C), temperature source Oral, resp. rate (!) 30, height 5\' 4"  (1.626 m), SpO2 100%.    Vent Mode: PRVC FiO2 (%):  [100 %] 100 % Set Rate:  [30 bmp] 30 bmp Vt Set:  [450 mL] 450 mL PEEP:  [5 cmH20] 5 cmH20 Plateau Pressure:  [18 cmH20] 18 cmH20  No intake or output data in the 24 hours ending 09/22/22 1348 There were no vitals filed for this visit.  Examination: General:  AoC ill appearing thin elderly female sedated s/p paralytics on MV HEENT: MM pink/dry, pupils 5, sluggish L> R, absent corneals Neuro: sedated s/p paralytics  CV: rr, ST, no murmur, +2 pulses, warm distally PULM:  MV supported, clear, no wheeze GI: soft, bs+, ND, wearing adult diaper Extremities: warm/dry, trace pedal edema  Skin: older appearing ecchymosis to BUE's, posterior not able to be examined at this time  Resolved Hospital Problem list    Assessment & Plan:   Vfib/ PEA cardiac arrest  Shock, undifferentiated at this time, possibly multifactorial ddx hypovolemic, septic, cardiogenic   Afib with RVR> new - on tele review> appears pt was in afib with RVR and had PVC on twave > vfib initially with multiple severe metabolic derangements>hypomagnesemia, hypokalemia, hypocalcemia P:  - admit to ICU - cont NE for MAP goal > 65.  Will need CVL and aline on arrival to ICU - currently remains in ST.  Continue amiodarone gtt for now.  Monitor QTc - hold systemic AC for now, pending CTH, coagulapathy and high HAS-BLED score 3 - aggressive optimization of electrolytes as below.  Repeat CMET now, add on Mag to initial labs - check coox/ CVP after CVL placement - echo, BNP, trend troponin, trend lactic acid.  EKG without acute STE  - TTM protocol, avoid fever, goal normothermia - empiric nosocomial coverage with cefepime  and vancomycin for now, de-escalate as able - pan-culture, trend WBC/ fever curve   Multiple electrolyze derangements> likely related to chronic diarrhea, malnutrition P:  - additional calcium gluconate 1gm now, Mag 2gm and s/p 1 run of KCL.   - Mag 0.9 per initial labs  - Will replete KCL further pending CMET/ renal function - trend Mag q12   Acute hypoxic respiratory failure  r/o HCAP vs aspiration PNA Recent COVID (? Dx 8/22, tx with paxlovid) - suspected related pulmonary edema given acute CXR changes with possible AoC HF exacerbation vs third spacing due to malnutrition with new small bilateral pleural effusions (lasix recently stopped) P:  - full MV support, 4-8cc/kg IBW with goal Pplat <30 and DP<15  - VAP prevention protocol/ PPI - PAD protocol for sedation> propofol, prn fentanyl for RASS goal 0/-1 - repeat ABG now s/p vent changes and PRN - CXR in am - prn albuterol - check MRSA PCR  - wean FiO2 as able for SpO2 >92% - daily SAT & SBT when appropriate  - aggressive pulmonary hygiene> IS, flutter, mobilize  - airborne precautions for now   Acute metabolic encephalopathy P:  - check CTH for completeness, coagulapathy, s/p cardiac arrest - TTM as above - EEG  - serial neuro exams - supportive care as above/ infectious workup   High risk for AKI given hypotension/ cardiac arrest lactic acidosis  Chronic NAGMA likely 2/2 chronic diarrhea P:  - suprisingly, sCr normal  - s/p bicarb pushes in ER.  Caution till hypokalemia corrected - looks clinically dry but given pulmonary edema hold off on further fluids for now - foley - trend renal indices  - strict I/Os, daily wts - avoid nephrotoxins, renal dose meds, hemodynamic support as above   Coagulapathy, mild - INR 1.8, LFTs wnl except for elevated alk phos  - repeat coags in am  Hx HTN, HLD - lisinopril and lasix recently stopped and placed on coreg to help with tachycardia.  Continue to hold for now with  hypotension   Protein calorie malnutrition - RD consult for TF  - monitor for refeeding syndrome   Chronic diarrhea - hold questran/ budesonide  - monitor for diarrhea   GERD - PPI    Anxiety/ depression - hold cymbalta, xanax for now   Suspected Failure to thrive given recent documentation of weight loss and functional decline - ongoing GOC with husband.  PMT consulted     Best Practice (right click and "Reselect all SmartList Selections" daily)   Diet/type: NPO DVT prophylaxis: SCD GI prophylaxis: PPI Lines: N/A Foley:  N/A Code Status:  full code Last date of multidisciplinary goals of care discussion [9/4]  Husband updated in ER by Dr. Denese Killings  Labs   CBC: Recent Labs  Lab 09/16/22 0426 09/17/22 0419 09/22/22 0927 09/22/22 1213  WBC 8.0 8.4 18.4*  --   NEUTROABS 5.7 5.9 17.4*  --   HGB 10.1* 10.4* 10.3* 9.5*  HCT 29.1* 30.5* 31.0* 28.0*  MCV 96.7 100.3* 103.0*  --   PLT 185 170 218  --     Basic  Metabolic Panel: Recent Labs  Lab 09/16/22 0426 09/17/22 0419 09/22/22 0927 09/22/22 1213  NA 132* 136 133* 139  K 3.5 4.3 3.1* 3.4*  CL 103 104 105  --   CO2 20* 23 13*  --   GLUCOSE 127* 139* 127*  --   BUN 21 12 6*  --   CREATININE 0.78 0.70 0.90  --   CALCIUM 6.3* 7.1* 5.8*  --   MG  --   --  0.9*  --    GFR: Estimated Creatinine Clearance: 36.2 mL/min (by C-G formula based on SCr of 0.9 mg/dL). Recent Labs  Lab 09/16/22 0426 09/17/22 0419 09/22/22 0927 09/22/22 0933 09/22/22 1212  WBC 8.0 8.4 18.4*  --   --   LATICACIDVEN  --   --   --  2.9* 8.1*    Liver Function Tests: Recent Labs  Lab 09/22/22 0927  AST 32  ALT 12  ALKPHOS 139*  BILITOT 1.0  PROT 5.0*  ALBUMIN 1.9*   No results for input(s): "LIPASE", "AMYLASE" in the last 168 hours. Recent Labs  Lab 09/22/22 1020  AMMONIA 23    ABG    Component Value Date/Time   PHART 7.344 (L) 08/14/2019 0252   PCO2ART 25.6 (L) 08/14/2019 0252   PO2ART 62 (L) 08/14/2019  0252   HCO3 8.5 (L) 09/22/2022 1213   TCO2 10 (L) 09/22/2022 1213   ACIDBASEDEF 22.0 (H) 09/22/2022 1213   O2SAT 61 09/22/2022 1213     Coagulation Profile: Recent Labs  Lab 09/22/22 0927  INR 1.8*    Cardiac Enzymes: No results for input(s): "CKTOTAL", "CKMB", "CKMBINDEX", "TROPONINI" in the last 168 hours.  HbA1C: Hgb A1c MFr Bld  Date/Time Value Ref Range Status  03/27/2015 04:01 AM 6.3 (H) 4.8 - 5.6 % Final    Comment:    (NOTE)         Pre-diabetes: 5.7 - 6.4         Diabetes: >6.4         Glycemic control for adults with diabetes: <7.0     CBG: No results for input(s): "GLUCAP" in the last 168 hours.  Review of Systems:   Unable as patient is encephalopathic, intubated on MV  Past Medical History:  She,  has a past medical history of Anxiety, Chronic diarrhea, Coarse tremors, Colon polyp, DDD (degenerative disc disease), cervical, Hiatal hernia, History of skin cancer, Hypercholesteremia, Hypertension, Migraines, Osteoporosis, Shortness of breath dyspnea, Tremor, and Weight loss.   Surgical History:   Past Surgical History:  Procedure Laterality Date   BIOPSY  07/10/2022   Procedure: BIOPSY;  Surgeon: Charlott Rakes, MD;  Location: [facility];  Service: Gastroenterology;;   ESOPHAGEAL MANOMETRY N/A 01/15/2015   Procedure: ESOPHAGEAL MANOMETRY (EM);  Surgeon: Graylin Shiver, MD;  Location: [facility];  Service: Endoscopy;  Laterality: N/A;   ESOPHAGOGASTRODUODENOSCOPY (EGD) WITH PROPOFOL Left 07/10/2022   Procedure: ESOPHAGOGASTRODUODENOSCOPY (EGD) WITH PROPOFOL;  Surgeon: Charlott Rakes, MD;  Location: [facility];  Service: Gastroenterology;  Laterality: Left;   HIATAL HERNIA REPAIR     KYPHOPLASTY N/A 12/22/2017   Procedure: LUMBAR TWO KYPHOPLASTY;  Surgeon: Estill Bamberg, MD;  Location: [facility] OR;  Service: Orthopedics;  Laterality: N/A;  LUMBAR TWO KYPHOPLASTY   RHINOPLASTY     SINUS SURGERY WITH INSTATRAK       Social History:   reports that she  quit smoking about 34 years ago. Her smoking use included cigarettes. She has never used smokeless tobacco. She reports current alcohol  use. She reports that she does not use drugs.   Family History:  Her family history includes COPD in her father; Hypertension in her brother; Lung cancer in her mother; Tremor in her brother and father.   Allergies Allergies  Allergen Reactions   Macrodantin [Nitrofurantoin Macrocrystal] Shortness Of Breath, Itching, Nausea And Vomiting, Swelling, Rash and Other (See Comments)    Tongue swelling   Topamax [Topiramate] Other (See Comments)    Loss of taste, fatigue!!!!!!   Diclofenac Other (See Comments)    Stomach burning   Tramadol Other (See Comments)    Stomach burning   Rice Nausea And Vomiting   Rosemary Oil Nausea And Vomiting   Codeine Nausea And Vomiting     Home Medications  Prior to Admission medications   Medication Sig Start Date End Date Taking? Authorizing Provider  albuterol (VENTOLIN HFA) 108 (90 Base) MCG/ACT inhaler Inhale 2 puffs into the lungs every 4 (four) hours as needed for shortness of breath or wheezing. 07/26/22   [provider]  ALPRAZolam Prudy Feeler) 0.25 MG tablet Take 1 tablet (0.25 mg total) by mouth daily as needed (tremors). 08/23/19   Narda Bonds, MD  atorvastatin (LIPITOR) 20 MG tablet Take 20 mg by mouth daily.    [provider]  budesonide (ENTOCORT EC) 3 MG 24 hr capsule Take 9 mg by mouth in the morning.    [provider]  carvedilol (COREG) 6.25 MG tablet Take 0.5 tablets (3.125 mg total) by mouth 2 (two) times daily with a meal. 09/17/22 10/17/22  Dahal, Melina Schools, MD  cholestyramine light (PREVALITE) 4 g packet Take 1 packet (4 g total) by mouth 3 (three) times daily. 09/17/22 10/17/22  Lorin Glass, MD  cyclobenzaprine (FLEXERIL) 5 MG tablet Take 5 mg by mouth as needed for muscle spasms (may repeat after 3-4 hours; max of 4 tablets in a day). 08/11/22   [provider]  DULoxetine  (CYMBALTA) 20 MG capsule Take 20 mg by mouth daily.    [provider]  feeding supplement (ENSURE ENLIVE / ENSURE PLUS) LIQD Take 237 mLs by mouth 2 (two) times daily between meals. 09/17/22   Lorin Glass, MD  loperamide (IMODIUM A-D) 2 MG tablet Take 4-6 mg by mouth See admin instructions. Take 6 mg by mouth in the morning and an additional 4 mg with each subsequent loose stool- OR AS OTHERWISE INSTRUCTED    [provider]  pantoprazole (PROTONIX) 40 MG tablet Take 1 tablet (40 mg total) by mouth daily. Follow up with gastroenterology 07/15/22 09/14/22  Zigmund Daniel., MD  primidone (MYSOLINE) 50 MG tablet Take 2 tablets (100 mg total) by mouth 3 (three) times daily. Must keep follow up 07/07/2020 for ongoing refills Patient taking differently: Take 100 mg by mouth in the morning, at noon, and at bedtime. 06/04/20   Butch Penny, NP  sodium bicarbonate 650 MG tablet Take 1 tablet (650 mg total) by mouth 3 (three) times daily for 5 days. 09/17/22 09/22/22  Lorin Glass, MD  Vitamin D, Ergocalciferol, (DRISDOL) 1.25 MG (50000 UNIT) CAPS capsule Take 50,000 Units by mouth every Monday.    [provider]     Critical care time: 65 mins      [name], MSN, AG-ACNP-BC [facility] 09/22/2022, 2:26 PM  See Amion for pager If no response to pager , please call [phone] until 7pm After 7:00 pm call Elink  [phone]

## 2022-09-22 NOTE — ED Provider Notes (Signed)
[place] EMERGENCY DEPARTMENT AT [facility] Provider Note   CSN: [id number] Arrival date & time: 09/22/22  7829     History  Chief Complaint  Patient presents with   Shortness of Breath    LEOLIA JUNCO is a 82 y.o. female with past medical history significant for hypertension, hyperlipidemia, chronic diarrhea, anxiety coarse tremor presents to the emergency department via EMS from [facility] for evaluation of shortness of breath.  EMS reports that patient was hypoxic in the 80s and hypotensive with a systolic BP of 90 for which they gave 500 mL normal saline and O2 and route improving blood pressure and sats. Pt is not normally on O2 at SNF.  Upon evaluation, patient has no complaints of shortness of breath, chest pain, cough, congestion, urinary symptoms.  Patient is alert with slow responsiveness to questions. At bedside in the ED, tachycardia and tachypnea are noted.  No hypoxia or hypotension at this time.   Shortness of Breath Associated symptoms: no abdominal pain, no chest pain, no cough, no ear pain, no fever, no rash, no sore throat and no vomiting        Home Medications Prior to Admission medications   Medication Sig Start Date End Date Taking? Authorizing Provider  albuterol (VENTOLIN HFA) 108 (90 Base) MCG/ACT inhaler Inhale 2 puffs into the lungs every 4 (four) hours as needed for shortness of breath or wheezing. 07/26/22  Yes [provider]  ALPRAZolam (XANAX) 0.25 MG tablet Take 1 tablet (0.25 mg total) by mouth daily as needed (tremors). 08/23/19  Yes Narda Bonds, MD  atorvastatin (LIPITOR) 20 MG tablet Take 20 mg by mouth daily.   Yes [provider]  budesonide (ENTOCORT EC) 3 MG 24 hr capsule Take 9 mg by mouth in the morning.   Yes [provider]  carvedilol (COREG) 6.25 MG tablet Take 0.5 tablets (3.125 mg total) by mouth 2 (two) times daily with a meal. 09/17/22 10/17/22 Yes Dahal, Melina Schools, MD  cholestyramine light  (PREVALITE) 4 g packet Take 1 packet (4 g total) by mouth 3 (three) times daily. 09/17/22 10/17/22 Yes Dahal, Melina Schools, MD  cyclobenzaprine (FLEXERIL) 5 MG tablet Take 5 mg by mouth as needed for muscle spasms (may repeat after 3-4 hours; max of 4 tablets in a day). 08/11/22  Yes [provider]  DULoxetine (CYMBALTA) 20 MG capsule Take 20 mg by mouth daily.   Yes [provider]  loperamide (IMODIUM A-D) 2 MG tablet Take 4-6 mg by mouth See admin instructions. Take 6 mg by mouth in the morning and an additional 4 mg with each subsequent loose stool- OR AS OTHERWISE INSTRUCTED   Yes [provider]  pantoprazole (PROTONIX) 40 MG tablet Take 1 tablet (40 mg total) by mouth daily. Follow up with gastroenterology 07/15/22 09/22/22 Yes Zigmund Daniel., MD  primidone (MYSOLINE) 50 MG tablet Take 2 tablets (100 mg total) by mouth 3 (three) times daily. Must keep follow up 07/07/2020 for ongoing refills Patient taking differently: Take 100 mg by mouth in the morning, at noon, and at bedtime. 06/04/20  Yes Butch Penny, NP  sodium bicarbonate 650 MG tablet Take 1 tablet (650 mg total) by mouth 3 (three) times daily for 5 days. 09/17/22 09/22/22 Yes Dahal, Melina Schools, MD  Vitamin D, Ergocalciferol, (DRISDOL) 1.25 MG (50000 UNIT) CAPS capsule Take 50,000 Units by mouth every Monday.   Yes [provider]  feeding supplement (ENSURE ENLIVE / ENSURE PLUS) LIQD Take 237  mLs by mouth 2 (two) times daily between meals. 09/17/22   Lorin Glass, MD      Allergies    Macrodantin [nitrofurantoin macrocrystal], Topamax [topiramate], Diclofenac, Tramadol, Rice, Rosemary oil, and Codeine    Review of Systems   Review of Systems  Constitutional:  Negative for chills and fever.  HENT:  Negative for ear pain, rhinorrhea, sinus pressure and sore throat.   Eyes:  Negative for pain and visual disturbance.  Respiratory:  Positive for shortness of breath. Negative for cough and chest tightness.    Cardiovascular:  Negative for chest pain and palpitations.  Gastrointestinal:  Negative for abdominal pain and vomiting.  Genitourinary:  Negative for decreased urine volume, dysuria, hematuria and urgency.  Musculoskeletal:  Negative for arthralgias and back pain.  Skin:  Negative for color change and rash.  Neurological:  Negative for seizures and syncope.  All other systems reviewed and are negative.   Physical Exam Updated Vital Signs BP 129/74   Pulse (!) 118   Temp 98.4 F (36.9 C) (Oral)   Resp (!) 30   Ht 5\' 4"  (1.626 m)   SpO2 100%   BMI 17.41 kg/m  Physical Exam Vitals and nursing note reviewed.  Constitutional:      Appearance: She is well-developed. She is ill-appearing.  HENT:     Head: Normocephalic and atraumatic.  Eyes:     Conjunctiva/sclera: Conjunctivae normal.  Cardiovascular:     Rate and Rhythm: Normal rate and regular rhythm.     Heart sounds: No murmur heard. Pulmonary:     Effort: Pulmonary effort is normal. No respiratory distress.     Breath sounds: Normal breath sounds.  Abdominal:     Palpations: Abdomen is soft.     Tenderness: There is no abdominal tenderness.  Musculoskeletal:        General: No swelling.     Cervical back: Neck supple.     Comments: UE tremor at baseline  Skin:    General: Skin is warm and dry.     Capillary Refill: Capillary refill takes less than 2 seconds.  Neurological:     Mental Status: She is alert.     ED Results / Procedures / Treatments   Labs (all labs ordered are listed, but only abnormal results are displayed) Labs Reviewed  COMPREHENSIVE METABOLIC PANEL - Abnormal; Notable for the following components:      Result Value   Sodium 133 (*)    Potassium 3.1 (*)    CO2 13 (*)    Glucose, Bld 127 (*)    BUN 6 (*)    Calcium 5.8 (*)    Total Protein 5.0 (*)    Albumin 1.9 (*)    Alkaline Phosphatase 139 (*)    All other components within normal limits  CBC WITH DIFFERENTIAL/PLATELET - Abnormal;  Notable for the following components:   WBC 18.4 (*)    RBC 3.01 (*)    Hemoglobin 10.3 (*)    HCT 31.0 (*)    MCV 103.0 (*)    MCH 34.2 (*)    Neutro Abs 17.4 (*)    Lymphs Abs 0.3 (*)    Abs Immature Granulocytes 0.21 (*)    All other components within normal limits  PROTIME-INR - Abnormal; Notable for the following components:   Prothrombin Time 21.0 (*)    INR 1.8 (*)    All other components within normal limits  D-DIMER, QUANTITATIVE (NOT AT [facility]) - Abnormal; Notable for the following  components:   D-Dimer, Quant 5.11 (*)    All other components within normal limits  VITAMIN B12 - Abnormal; Notable for the following components:   Vitamin B-12 945 (*)    All other components within normal limits  MAGNESIUM - Abnormal; Notable for the following components:   Magnesium 0.9 (*)    All other components within normal limits  I-STAT CG4 LACTIC ACID, ED - Abnormal; Notable for the following components:   Lactic Acid, Venous 2.9 (*)    All other components within normal limits  I-STAT CG4 LACTIC ACID, ED - Abnormal; Notable for the following components:   Lactic Acid, Venous 8.1 (*)    All other components within normal limits  I-STAT VENOUS BLOOD GAS, ED - Abnormal; Notable for the following components:   pH, Ven 6.973 (*)    pCO2, Ven 36.5 (*)    pO2, Ven 48 (*)    Bicarbonate 8.5 (*)    TCO2 10 (*)    Acid-base deficit 22.0 (*)    Potassium 3.4 (*)    Calcium, Ion 0.91 (*)    HCT 28.0 (*)    Hemoglobin 9.5 (*)    All other components within normal limits  CULTURE, BLOOD (ROUTINE X 2)  CULTURE, BLOOD (ROUTINE X 2)  CULTURE, RESPIRATORY W GRAM STAIN  MRSA NEXT GEN BY PCR, NASAL  SARS CORONAVIRUS 2 BY RT PCR  AMMONIA  ETHANOL  URINALYSIS, W/ REFLEX TO CULTURE (INFECTION SUSPECTED)  BLOOD GAS, ARTERIAL  MAGNESIUM  PROCALCITONIN  STREP PNEUMONIAE URINARY ANTIGEN  LACTIC ACID, PLASMA  LACTIC ACID, PLASMA  BRAIN NATRIURETIC PEPTIDE  COMPREHENSIVE METABOLIC PANEL   PHOSPHORUS  BLOOD GAS, ARTERIAL  TROPONIN I (HIGH SENSITIVITY)  TROPONIN I (HIGH SENSITIVITY)  TROPONIN I (HIGH SENSITIVITY)    EKG EKG Interpretation Date/Time:  Wednesday September 22 2022 09:22:05 EDT Ventricular Rate:  119 PR Interval:  248 QRS Duration:  83 QT Interval:  351 QTC Calculation: 494 R Axis:   87  Text Interpretation: Sinus tachycardia Prolonged PR interval Consider left atrial enlargement Borderline right axis deviation Low voltage, precordial leads Anteroseptal infarct, old Borderline prolonged QT interval Confirmed by Eber Hong ([id number]) on 09/22/2022 10:18:00 AM  Radiology DG Chest Port 1 View  Result Date: 09/22/2022 CLINICAL DATA:  placement of tube EXAM: PORTABLE CHEST 1 VIEW COMPARISON:  09/22/2022, 10 a.m. FINDINGS: There is diffusely increased vascular congestion, in very short timeframe, favoring congestive heart failure/pulmonary edema. There are bilateral pleural effusions, better seen on the CT scan chest performed just before this exam. Stable cardio-mediastinal silhouette. No acute osseous abnormalities. Upper lumbar vertebral kyphoplasty again seen. The soft tissues are within normal limits. Tube/lines: * Endotracheal tube tip is 3.5 cm from the carina. * An enteric tube extends below diaphragm, tip out of the view of the radiograph. However, the side hole overlies the left upper abdomen, within the proximal stomach. *Multiple additional monitoring electrodes noted overlying the upper abdomen. IMPRESSION: *Interval development of congestive heart failure/pulmonary edema. *Tube/lines, as described above. Electronically Signed   By: Jules Schick M.D.   On: 09/22/2022 13:18   CT Angio Chest PE W and/or Wo Contrast  Result Date: 09/22/2022 CLINICAL DATA:  Elevated D-dimer. Shortness of breath. Evaluate for pulmonary embolism. EXAM: CT ANGIOGRAPHY CHEST WITH CONTRAST TECHNIQUE: Multidetector CT imaging of the chest was performed using the standard protocol  during bolus administration of intravenous contrast. Multiplanar CT image reconstructions and MIPs were obtained to evaluate the vascular anatomy. RADIATION DOSE REDUCTION: This  exam was performed according to the departmental dose-optimization program which includes automated exposure control, adjustment of the mA and/or kV according to patient size and/or use of iterative reconstruction technique. CONTRAST:  75mL OMNIPAQUE IOHEXOL 350 MG/ML SOLN COMPARISON:  09/13/2022; chest radiograph-09/22/2022 FINDINGS: Vascular Findings: There is adequate opacification of the pulmonary arterial system with the main pulmonary artery measuring 816 Hounsfield units. There are no discrete filling defects within the pulmonary arterial tree to suggest pulmonary embolism. Normal caliber of the main pulmonary artery. Borderline cardiomegaly. Coronary artery calcifications. Scattered atherosclerotic plaque within a normal caliber thoracic aorta. Conventional configuration of the aortic arch. The descending thoracic aorta is of normal caliber. Review of the MIP images confirms the above findings. ---------------------------------------------------------------------------------- Nonvascular Findings: Mediastinum/Lymph Nodes: No bulky mediastinal, hilar or axillary lymphadenopathy Lungs/Pleura: Compared to recent chest CT performed 09/13/2022, there has been development of small to moderate-sized bilateral pleural effusions with associated dependent subsegmental atelectasis, most conspicuously involving the bilateral lower lobes. No associated air bronchograms. Redemonstrated apical predominant centrilobular emphysematous change. The central pulmonary airways appear widely patent. No discrete pulmonary nodules given parenchymal atelectasis/collapse. Upper abdomen: Limited early arterial phase evaluation of the upper abdomen demonstrates moderate-to-large sized hiatal hernia. Musculoskeletal: No acute or aggressive osseous abnormalities.  Mild scoliotic curvature of the thoracolumbar spine. Sequela of previous cement augmentation of the L2 vertebral body. Redemonstrated mild (approximately 25%) compression deformity involving the superior endplate of the T3 vertebral body. Normal appearance of the thyroid gland. Regional soft tissues appear normal. IMPRESSION: 1. No evidence of pulmonary embolism. 2. Borderline cardiomegaly with interval development of small to moderate-sized grossly symmetric bilateral pleural effusions suggestive of pulmonary edema. Further evaluation with cardiac echo could be performed as indicated. 3. Coronary artery calcifications. Aortic Atherosclerosis (ICD10-I70.0). 4.  Emphysema (ICD10-J43.9). 5. Moderate-to-large sized hiatal hernia. Electronically Signed   By: Simonne Come M.D.   On: 09/22/2022 12:24   DG Chest 2 View  Result Date: 09/22/2022 CLINICAL DATA:  Shortness of breath since yesterday and is hypoxic. Hypotensive EXAM: CHEST - 2 VIEW COMPARISON:  X-ray 09/13/2022 and CT scan FINDINGS: Stable cardiopericardial silhouette. Underinflation. New lung base opacities particularly on the left with some effusion. No pneumothorax. Chronic interstitial changes. Calcified aorta. Overlapping cardiac leads. Film is rotated to the right. Diffuse degenerative changes IMPRESSION: New small pleural effusions and adjacent opacities, left-greater-than-right. Electronically Signed   By: Karen Kays M.D.   On: 09/22/2022 10:39    Procedures Procedures    Medications Ordered in ED Medications  amiodarone (NEXTERONE) 1.8 mg/mL load via infusion 150 mg (150 mg Intravenous Bolus from Bag 09/22/22 1203)    Followed by  amiodarone (NEXTERONE PREMIX) 360-4.14 MG/200ML-% (1.8 mg/mL) IV infusion (60 mg/hr Intravenous New Bag/Given 09/22/22 1203)    Followed by  amiodarone (NEXTERONE PREMIX) 360-4.14 MG/200ML-% (1.8 mg/mL) IV infusion (has no administration in time range)  norepinephrine (LEVOPHED) 4mg  in (0.016 mg/mL) premix  infusion (30 mcg/min Intravenous Rate/Dose Change 09/22/22 1317)  etomidate (AMIDATE) injection (20 mg Intravenous Given 09/22/22 1156)  EPINEPHrine (ADRENALIN) 1 MG/10ML injection (1 mg Intravenous Given 09/22/22 1215)  sodium bicarbonate injection (100 mEq Intravenous Given 09/22/22 1225)  Oral care mouth rinse (has no administration in time range)  Oral care mouth rinse (has no administration in time range)  pantoprazole (PROTONIX) injection 40 mg (has no administration in time range)  albuterol (PROVENTIL) (2.5 MG/3ML) 0.083% nebulizer solution 2.5 mg (has no administration in time range)  insulin aspart (novoLOG) injection 0-6 Units (has no administration in  time range)  docusate (COLACE) 50 MG/5ML liquid 100 mg (has no administration in time range)  polyethylene glycol (MIRALAX / GLYCOLAX) packet 17 g (has no administration in time range)  fentaNYL (SUBLIMAZE) injection 25 mcg (has no administration in time range)  fentaNYL (SUBLIMAZE) injection 25-100 mcg (has no administration in time range)  acetaminophen (TYLENOL) tablet 650 mg (has no administration in time range)    Or  acetaminophen (TYLENOL) 160 MG/5ML solution 650 mg (has no administration in time range)    Or  acetaminophen (TYLENOL) suppository 650 mg (has no administration in time range)  busPIRone (BUSPAR) tablet 30 mg (has no administration in time range)  magnesium sulfate IVPB 2 g 50 mL (has no administration in time range)  sodium chloride 0.9 % bolus 500 mL (0 mLs Intravenous Stopped 09/22/22 1140)  cefTRIAXone (ROCEPHIN) 1 g in sodium chloride 0.9 % 100 mL IVPB (0 g Intravenous Stopped 09/22/22 1126)  calcium gluconate 1 g/ 50 mL sodium chloride IVPB (0 mg Intravenous Stopped 09/22/22 1154)  sodium chloride 0.9 % bolus 1,000 mL (0 mLs Intravenous Stopped 09/22/22 1255)  potassium chloride 10 mEq in 100 mL IVPB (0 mEq Intravenous Stopped 09/22/22 1314)  iohexol (OMNIPAQUE) 350 MG/ML injection 75 mL (75 mLs Intravenous Contrast Given  09/22/22 1141)  magnesium sulfate 2 GM/50ML IVPB (0 g  Stopped 09/22/22 1230)    ED Course/ Medical Decision Making/ A&P                             Geneva (Revised) Score: 6, Geneva Score Interpretation: Moderate Risk Group: ~20-30% incidence of pulmonary embolism from several studies PERC Score: 2, PERC Score Interpretation: If any criteria are positive, the PERC rule cannot be used to rule out PE in this patient Medical Decision Making Amount and/or Complexity of Data Reviewed Labs: ordered. Radiology: ordered.  Risk Prescription drug management. Decision regarding hospitalization.   82 year old female who presents to the emergency department via EMS for evaluation of hypoxia. See HPI for further details. Discussion is had with Dr. Hyacinth Meeker who assessed patient and agreed with treatment plan.  Upon assessment, patient had no current complaints but has poor responsiveness to questioning responding with short phrases.  Upon chart review, neurology suspected patient has a neurodegenerative process.  Tachycardia and tachypnea noted upon assessment.  She was prescribed Paxlovid on 09/10/2022 due to recent COVID-19 infection however had decreased appetite and other GI symptoms requiring it to be discontinued 4 days after initiation. Suspect infectious or PE as the etiology for shortness of breath, hypoxia, hypotension, tachypnea.  Lab work significant for hypocalcemia (5.8), hypokalemia (3.1), leukocytosis (18.4) elevated lactic (2.9).  Supplementation for calcium and potassium provided.  500 mL normal saline and 1 g Rocephin initiated for suspected sepsis.    Due to reported hypoxia, tachycardia, tachypnea, and recent COVID infection, considered PE as etiology for hypoxemia.  PERC score of 2. D-dimer ordered and positive.  CTA negative for PE.  11:30 blood pressure decreased to 97/52 and additional liter given for blood pressure and suspected sepsis.  Patient remains tachycardic and tachypneic but no  hypoxia noted.  11:40 following return from CTA, patient developed a tachyarrhythmia with weak pulses which progressed into ventricular fibrillation.  Dr. Hyacinth Meeker managed patient care beginning ACLS protocol.  Patient was successfully intubated and resuscitated. Please see Dr. Rondel Baton note for full details of arrest and resuscitation.  Hospitalist is consulted and Selmer Dominion NP accepts patient. Dr. Hyacinth Meeker  discussed treatment and admission with patient's husband who understands and is agreeable to plan.        Final Clinical Impression(s) / ED Diagnoses Final diagnoses:  Cardiac arrest with ventricular fibrillation [facility])  COVID-19    Rx / DC Orders ED Discharge Orders     None         Judithann Sheen, PA 09/22/22 1536    Eber Hong, MD 09/23/22 [phone]

## 2022-09-22 NOTE — Progress Notes (Signed)
MB arrived for EEG hook up. Echo was still in room performing exam. NP spoke to me and asked to hold Off on EEG until later PM or next shift due to wanting to place a sterile central line procedure next.  That Patient was on Propofol and she was just getting the EEG based on Neuro thermie protocol, the patient was currently at temperature. She would re-assess and determine if EEG was needed afterwards.

## 2022-09-22 NOTE — Progress Notes (Signed)
  Echo pending.  Trop hs noted.  Labs just repeat now s/p CVL, lab unable to obtain blood draw prior to.    Pt is now starting to follow simple commands.    - CTH neg - coox/ CVP now and trend - HF team consulted for abnormal TTE pending final report, appreciate input and further recommendations.  Defer systemic AC to HF team.  ASA added.  - pending labs to be followed - vent adjusted given ABG results       [name], MSN, AG-ACNP-BC  Pulmonary & Critical Care 09/22/2022, 5:38 PM  See Amion for pager If no response to pager , please call [phone] until 7pm After 7:00 pm call Elink  [phone]

## 2022-09-22 NOTE — ED Notes (Signed)
CRITICAL VALUE STICKER  CRITICAL VALUE:Magnesium 0.9  RECEIVER (on-site recipient of call):Vang Kraeger E, RN  DATE & TIME NOTIFIED: 09/22/22  MESSENGER (representative from lab):lab  MD NOTIFIED: Agarwala  TIME OF NOTIFICATION: 1335  RESPONSE:

## 2022-09-22 NOTE — Progress Notes (Signed)
   09/22/22 2245  Vent Select  Invasive or Noninvasive Noninvasive  Adult Vent Y  Adult Ventilator Settings  Vent Type Servo i  Vent Mode BIPAP;PCV  Set Rate 12 bmp  FiO2 (%) 40 %  I Time 0.9 Sec(s)  Pressure Control 5 cmH20  PEEP 5 cmH20  Adult Ventilator Measurements  Peak Airway Pressure 10 L/min  Mean Airway Pressure 7 cmH20  Resp Rate Spontaneous 12 br/min  Resp Rate Total 24 br/min  Exhaled Vt 585 mL  Measured Ve 14.8 L  I:E Ratio Measured 1:1.9  Auto PEEP 0 cmH20  Total PEEP 5 cmH20  SpO2 97 %  Adult Ventilator Alarms  Alarms On Y  Ve High Alarm 22 L/min  Ve Low Alarm 4 L/min  Resp Rate High Alarm 36 br/min  Resp Rate Low Alarm 8  PEEP Low Alarm 3 cmH2O  Press High Alarm 26 cmH2O  VAP Prevention  HOB> 30 Degrees Y  Equipment wiped down Yes

## 2022-09-22 NOTE — ED Provider Notes (Signed)
This very pleasant but chronically ill 82 year old female recently admitted to the hospital with abdominal pain, diarrhea and recently diagnosed with COVID-19 presents with tachycardia and hypoxia from her nursing facility.  The patient presented tachycardic slightly hypotensive and though her mean arterial pressure was not terribly low she was given IV fluids it more than 30 cc/kg.  Because of her leukocytosis it was assumed that the patient had some kind of infection after COVID and antibiotics were started, a CT angiogram was ordered due to an elevated D-dimer and the risk for pulmonary embolism, I have personally viewed and interpreted this and find no findings of PE but she did have some effusions  Unfortunately when the patient came back from CT scan she developed a tacky arrhythmia and went into a narrow complex tachycardia that appeared to be atrial fibrillation.  Her pulses were weak, at the bedside I witnessed the patient going to a ventricular fibrillation cardiac arrest and immediately started CPR.  I administered 1 shock at 200 J of biphasic defibrillation which successfully converted the patient back into a narrow complex atrial fibrillation with a rate of 210 and irregularly irregular.  She then decompensated into ventricular tachycardia with very weak pulses, she remained unresponsive and was shocked again successfully back into a atrial fibrillation.  The patient was intubated, on the first attempt under direct laryngoscopy with a 4 oh Mac and a 7.0 ET tube.  This was confirmed in placement by auscultation color change and eventually imaging.  The patient then went into a PEA arrest with a bradycardic rhythm, she was given more medications including epinephrine every 3 minutes, atropine and I had a long discussion with the spouse about goals of care.  He requested everything be done but understands that if there is a point of limited viability he would be willing to have a stop.  I also  discussed the care with Cindee Lame and the critical care service, they will come see the patient assuming that she survives this devastating Cardiologic event.  I suspect that this is COVID-related heart failure, critically ill  .Critical Care  Performed by: Eber Hong, MD Authorized by: Eber Hong, MD   Critical care provider statement:    Critical care time (minutes):  45   Critical care time was exclusive of:  Separately billable procedures and treating other patients   Critical care was necessary to treat or prevent imminent or life-threatening deterioration of the following conditions:  Cardiac failure and respiratory failure   Critical care was time spent personally by me on the following activities:  Development of treatment plan with patient or surrogate, discussions with consultants, evaluation of patient's response to treatment, examination of patient, obtaining history from patient or surrogate, review of old charts, re-evaluation of patient's condition, pulse oximetry, ordering and review of radiographic studies, ordering and review of laboratory studies and ordering and performing treatments and interventions   I assumed direction of critical care for this patient from another provider in my specialty: no     Care discussed with: admitting provider   Comments:       Procedure Name: Intubation Date/Time: 09/22/2022 12:21 PM  Performed by: Eber Hong, MDPre-anesthesia Checklist: Patient identified, Patient being monitored, Emergency Drugs available, Timeout performed and Suction available Oxygen Delivery Method: Non-rebreather mask Preoxygenation: Pre-oxygenation with 100% oxygen Induction Type: Rapid sequence Ventilation: Mask ventilation without difficulty Laryngoscope Size: Mac and 4 Grade View: Grade I Tube size: 7.0 mm Number of attempts: 1 Airway Equipment and Method: Stylet  Placement Confirmation: ETT inserted through vocal cords under direct vision, CO2 detector and  Breath sounds checked- equal and bilateral Secured at: 23 cm Tube secured with: ETT holder Dental Injury: Teeth and Oropharynx as per pre-operative assessment  Difficulty Due To: Difficulty was unanticipated Comments:      CPR  Date/Time: 09/22/2022 12:21 PM  Performed by: Eber Hong, MD Authorized by: Eber Hong, MD  CPR Procedure Details:      Amount of time prior to administration of ACLS/BLS (minutes):  3   ACLS/BLS initiated by EMS: No     CPR/ACLS performed in the ED: Yes     Duration of CPR (minutes):  5   Outcome: ROSC obtained    CPR performed via ACLS guidelines under my direct supervision.  See RN documentation for details including defibrillator use, medications, doses and timing. Comments:     Intermittent loss of pulses   .Cardioversion  Date/Time: 09/22/2022 12:22 PM  Performed by: Eber Hong, MD Authorized by: Eber Hong, MD   Consent:    Consent obtained:  Emergent situation Universal protocol:    Patient identity confirmed:  Anonymous protocol, patient vented/unresponsive Pre-procedure details:    Cardioversion basis:  Emergent   Pre-procedure rhythm: V fib arrest. Attempt one:    Cardioversion mode:  Synchronous   Waveform:  Biphasic   Shock (Joules):  200   Shock outcome:  Conversion to other rhythm Attempt two:    Cardioversion mode:  Synchronous   Waveform:  Biphasic   Shock (Joules):  200   Shock outcome:  Conversion to other rhythm Post-procedure details:    Patient status:  Unresponsive   Patient tolerance of procedure:  Tolerated well, no immediate complications Comments:         Final diagnoses:  Cardiac arrest with ventricular fibrillation (HCC)  COVID-19       Eber Hong, MD 09/23/22 [phone]

## 2022-09-22 NOTE — ED Notes (Signed)
Pt taken to xray 

## 2022-09-22 NOTE — Procedures (Signed)
Arterial Catheter Insertion Procedure Note  EVERLIE VOELZ  [id number]  Jan 10, 1941  Date:09/22/22  Time:6:53 PM    Provider Performing: Elyn Peers    Procedure: Insertion of Arterial Line (95284) without US guidance  Indication(s) Blood pressure monitoring and/or need for frequent ABGs  Consent Unable to obtain consent due to emergent nature of procedure.  Anesthesia None   Time Out Verified patient identification, verified procedure, site/side was marked, verified correct patient position, special equipment/implants available, medications/allergies/relevant history reviewed, required imaging and test results available.   Sterile Technique Maximal sterile technique including full sterile barrier drape, hand hygiene, sterile gown, sterile gloves, mask, hair covering, sterile ultrasound probe cover (if used).   Procedure Description Area of catheter insertion was cleaned with chlorhexidine and draped in sterile fashion. Without real-time ultrasound guidance an arterial catheter was placed into the left radial artery.  Appropriate arterial tracings confirmed on monitor.     Complications/Tolerance None; patient tolerated the procedure well.   EBL Minimal   Specimen(s) None

## 2022-09-22 NOTE — Progress Notes (Addendum)
Pharmacy Antibiotic Note  Brenda [name] is a 82 y.o. female admitted on 09/22/2022 with sepsis.  Pharmacy has been consulted for vancomycin and cefepime dosing.  Plan: Vancomycin 1000 mg x 1, then 750 mg IV every 36 hours.  Goal trough 15-20 mcg/mL.  Calculated AUC 438 w/ Scr 0.9. Cefepime 2g IV q 12 hrs  Height: 5\' 4"  (162.6 cm) IBW/kg (Calculated) : 54.7  Temp (24hrs), Avg:98.4 F (36.9 C), Min:98.4 F (36.9 C), Max:98.4 F (36.9 C)  Recent Labs  Lab 09/16/22 0426 09/17/22 0419 09/22/22 0927 09/22/22 0933 09/22/22 1212  WBC 8.0 8.4 18.4*  --   --   CREATININE 0.78 0.70 0.90  --   --   LATICACIDVEN  --   --   --  2.9* 8.1*    Estimated Creatinine Clearance: 36.2 mL/min (by C-G formula based on SCr of 0.9 mg/dL).    Allergies  Allergen Reactions   Macrodantin [Nitrofurantoin Macrocrystal] Shortness Of Breath, Itching, Nausea And Vomiting, Swelling, Rash and Other (See Comments)    Tongue swelling   Topamax [Topiramate] Other (See Comments)    Loss of taste, fatigue!!!!!!   Diclofenac Other (See Comments)    Stomach burning   Tramadol Other (See Comments)    Stomach burning   Rice Nausea And Vomiting   Rosemary Oil Nausea And Vomiting   Codeine Nausea And Vomiting     Thank you for allowing pharmacy to be a part of this patient's care.  Reece Leader, Colon Flattery, BCCP Clinical Pharmacist  09/22/2022 3:40 PM   [facility] pharmacy phone numbers are listed on [email or web address]

## 2022-09-22 NOTE — ED Notes (Signed)
Rosc obtained

## 2022-09-22 NOTE — Procedures (Signed)
Central Venous Catheter Insertion Procedure Note  Brenda [name]  [id number]  May 15, 1940  Date:09/22/22  Time:5:18 PM   Provider Performing:Brooke Lodema Hong   Procedure: Insertion of Non-tunneled Central Venous [phone]) with US guidance ([id number])   Indication(s) Medication administration  Consent Risks of the procedure as well as the alternatives and risks of each were explained to the patient and/or caregiver.  Consent for the procedure was obtained and is signed in the bedside chart  Anesthesia Topical only with 1% lidocaine   Timeout Verified patient identification, verified procedure, site/side was marked, verified correct patient position, special equipment/implants available, medications/allergies/relevant history reviewed, required imaging and test results available.  Sterile Technique Maximal sterile technique including full sterile barrier drape, hand hygiene, sterile gown, sterile gloves, mask, hair covering, sterile ultrasound probe cover (if used).  Procedure Description Area of catheter insertion was cleaned with chlorhexidine and draped in sterile fashion.  With real-time ultrasound guidance a central venous catheter was placed into the right internal jugular vein. Nonpulsatile blood flow and easy flushing noted in all ports to 15cm.  The catheter was sutured in place, biopatch, and sterile dressing applied.    Complications/Tolerance None; patient tolerated the procedure well. Chest X-ray is ordered to verify placement for internal jugular or subclavian cannulation.    EBL Minimal  Specimen(s) - repeat BMET, trop hs, coox, lactic sent     [name], MSN, AG-ACNP-BC [facility] 09/22/2022, 5:19 PM  See Amion for pager If no response to pager , please call [phone] until 7pm After 7:00 pm call Elink  [phone]

## 2022-09-22 NOTE — Progress Notes (Signed)
ABG attempted 2X unsuccessfully. I will get another RT to draw ABG.

## 2022-09-22 NOTE — Progress Notes (Signed)
Pt self-extubated and placed on BIPAP per MD order. Pt tolerating well at this time.

## 2022-09-22 NOTE — ED Notes (Signed)
2 grams of magnesium given and 150 amiodarone bolus

## 2022-09-22 NOTE — ED Notes (Addendum)
Pt back from X-Ray.  

## 2022-09-22 NOTE — ED Notes (Addendum)
1100- Pt in vfib arrest, no pulse- CPR started  1102- epi given, CPR continued 1103- Pt shocked at 200 [place], pulses back  1103- epi given  1104- Pt brady, atropine given  1105- Pt back in vtach, and shocked at 200 [place], pt has pulses  1106- 20 of etomidate given  1106- 80 of rocc given  1107- Pt intubated 7'0 and 23 at the lip

## 2022-09-22 NOTE — Consult Note (Signed)
Advanced Heart Failure Team Consult Note   Primary Physician: Clovis Riley, L.August Saucer, MD PCP-Cardiologist:  None  Reason for Consultation: VF cardiac arrest  HPI:    Brenda [name] is seen today for evaluation of VF cardiac arrest at the request of Dr. Denese Killings, PCCM.   [name] is a 82 y.o. female with skin cancer, HTN, HLD, anxiety, tremor, chronic diarrhea, colon polyps, and migraines. Resident of [employer].   Brenda [name] presented to [facility] with SOB and hypoxia. Not on O2 at baseline. Recently discharged after being hospitalized for covid. In the ED SBP soft in 90s and tachypneic. Pertinent labs reviewed: HsTrop 1K, COVID +, Procal 8.13, Lactic acid 2.9>8.1, K 3.1, Calcium 5.8, SCr 0.90, Mg 0.9, Na 133. Head CT (-), CXR with CHF and bilateral pleural effusions. After returning from CT scan she had VT arrest (possibly torsades), requiring defibrillation and pressor support. Suspect 2/2 electrolyte abnormalities. PCCM consulted AHF team for possible takotsubo CM VS anterior wall infarct.   Intubated and sedated. Currently on levo @20 .   Cardiac studies reviewed: Echo done today, official read pending Echo 8/21: EF 65-70%, GIDD, RV normal, mild-mod MR   Home Medications Prior to Admission medications   Medication Sig Start Date End Date Taking? Authorizing Provider  albuterol (VENTOLIN HFA) 108 (90 Base) MCG/ACT inhaler Inhale 2 puffs into the lungs every 4 (four) hours as needed for shortness of breath or wheezing. 07/26/22  Yes [provider]  ALPRAZolam (XANAX) 0.25 MG tablet Take 1 tablet (0.25 mg total) by mouth daily as needed (tremors). 08/23/19  Yes Narda Bonds, MD  atorvastatin (LIPITOR) 20 MG tablet Take 20 mg by mouth daily.   Yes [provider]  budesonide (ENTOCORT EC) 3 MG 24 hr capsule Take 9 mg by mouth in the morning.   Yes [provider]  carvedilol (COREG) 6.25 MG tablet Take 0.5 tablets (3.125 mg total) by mouth 2 (two) times daily  with a meal. 09/17/22 10/17/22 Yes Dahal, Melina Schools, MD  cholestyramine light (PREVALITE) 4 g packet Take 1 packet (4 g total) by mouth 3 (three) times daily. 09/17/22 10/17/22 Yes Dahal, Melina Schools, MD  cyclobenzaprine (FLEXERIL) 5 MG tablet Take 5 mg by mouth as needed for muscle spasms (may repeat after 3-4 hours; max of 4 tablets in a day). 08/11/22  Yes [provider]  DULoxetine (CYMBALTA) 20 MG capsule Take 20 mg by mouth daily.   Yes [provider]  loperamide (IMODIUM A-D) 2 MG tablet Take 4-6 mg by mouth See admin instructions. Take 6 mg by mouth in the morning and an additional 4 mg with each subsequent loose stool- OR AS OTHERWISE INSTRUCTED   Yes [provider]  pantoprazole (PROTONIX) 40 MG tablet Take 1 tablet (40 mg total) by mouth daily. Follow up with gastroenterology 07/15/22 09/22/22 Yes Zigmund Juaquin [place]., MD  primidone (MYSOLINE) 50 MG tablet Take 2 tablets (100 mg total) by mouth 3 (three) times daily. Must keep follow up 07/07/2020 for ongoing refills Patient taking differently: Take 100 mg by mouth in the morning, at noon, and at bedtime. 06/04/20  Yes Butch Penny, NP  sodium bicarbonate 650 MG tablet Take 1 tablet (650 mg total) by mouth 3 (three) times daily for 5 days. 09/17/22 09/22/22 Yes Dahal, Melina Schools, MD  Vitamin D, Ergocalciferol, (DRISDOL) 1.25 MG (50000 UNIT) CAPS capsule Take 50,000 Units by mouth every Monday.   Yes [provider]  feeding supplement (ENSURE ENLIVE / ENSURE  PLUS) LIQD Take 237 mLs by mouth 2 (two) times daily between meals. 09/17/22   Lorin Glass, MD    Past Medical History: Past Medical History:  Diagnosis Date   Anxiety    Chronic diarrhea    Coarse tremors    Colon polyp    DDD (degenerative disc disease), cervical    Hiatal hernia    History of skin cancer    Hypercholesteremia    Hypertension    Migraines    Osteoporosis    Shortness of breath dyspnea    with exertion   Tremor    Weight loss      Past Surgical History: Past Surgical History:  Procedure Laterality Date   BIOPSY  07/10/2022   Procedure: BIOPSY;  Surgeon: Charlott Rakes, MD;  Location: [facility];  Service: Gastroenterology;;   ESOPHAGEAL MANOMETRY N/A 01/15/2015   Procedure: ESOPHAGEAL MANOMETRY (EM);  Surgeon: Graylin Shiver, MD;  Location: [facility];  Service: Endoscopy;  Laterality: N/A;   ESOPHAGOGASTRODUODENOSCOPY (EGD) WITH PROPOFOL Left 07/10/2022   Procedure: ESOPHAGOGASTRODUODENOSCOPY (EGD) WITH PROPOFOL;  Surgeon: Charlott Rakes, MD;  Location: [facility];  Service: Gastroenterology;  Laterality: Left;   HIATAL HERNIA REPAIR     KYPHOPLASTY N/A 12/22/2017   Procedure: LUMBAR TWO KYPHOPLASTY;  Surgeon: Estill Bamberg, MD;  Location: [facility] OR;  Service: Orthopedics;  Laterality: N/A;  LUMBAR TWO KYPHOPLASTY   RHINOPLASTY     SINUS SURGERY WITH INSTATRAK      Family History: Family History  Problem Relation Age of Onset   Lung cancer Mother    COPD Father    Tremor Father    Hypertension Brother    Tremor Brother     Social History: Social History   Socioeconomic History   Marital status: Married    Spouse name: Not on file   Number of children: 0   Years of education: MA   Highest education level: Not on file  Occupational History   Occupation: Retired  Tobacco Use   Smoking status: Former    Current packs/day: 0.00    Types: Cigarettes    Quit date: 09/11/1988    Years since quitting: 34.0   Smokeless tobacco: Never  Vaping Use   Vaping status: Never Used  Substance and Sexual Activity   Alcohol use: Yes    Comment: 2 drinks at night   Drug use: No   Sexual activity: Not on file  Other Topics Concern   Not on file  Social History Narrative   Denies any caffeine use    Retired    Left hnaded   Social Determinants of Health   Financial Resource Strain: Not on file  Food Insecurity: No Food Insecurity (09/13/2022)   Hunger Vital Sign    Worried About Running Out of  Food in the Last Year: Never true    Ran Out of Food in the Last Year: Never true  Transportation Needs: No Transportation Needs (09/13/2022)   PRAPARE - Administrator, Civil Service (Medical): No    Lack of Transportation (Non-Medical): No  Physical Activity: Not on file  Stress: Not on file  Social Connections: Not on file    Allergies:  Allergies  Allergen Reactions   Macrodantin [Nitrofurantoin Macrocrystal] Shortness Of Breath, Itching, Nausea And Vomiting, Swelling, Rash and Other (See Comments)    Tongue swelling   Topamax [Topiramate] Other (See Comments)    Loss of taste, fatigue!!!!!!   Diclofenac Other (See Comments)    Stomach burning  Tramadol Other (See Comments)    Stomach burning   Rice Nausea And Vomiting   Rosemary Oil Nausea And Vomiting   Codeine Nausea And Vomiting    Objective:    Vital Signs:   Temp:  [98.4 F (36.9 C)-98.8 F (37.1 C)] 98.6 F (37 C) (09/04 1615) Pulse Rate:  [30-169] 87 (09/04 1615) Resp:  [10-30] 30 (09/04 1615) BP: (89-146)/(39-98) 89/49 (09/04 1615) SpO2:  [94 %-100 %] 100 % (09/04 1615) FiO2 (%):  [100 %] 100 % (09/04 1620)    Weight change: There were no vitals filed for this visit.  Intake/Output:   Intake/Output Summary (Last 24 hours) at 09/22/2022 1653 Last data filed at 09/22/2022 1500 Gross per 24 hour  Intake 2164.9 ml  Output 125 ml  Net 2039.9 ml      Physical Exam  General: elderly frail intubated and sedated HEENT: +ETT Neck: supple. JVD flat Carotids 2+ bilat; no bruits. No lymphadenopathy or thyromegaly appreciated. Cor: Regular rate & rhythm. No rubs, gallops or murmurs. Lungs: clear Abdomen: soft, nontender, nondistended. No hepatosplenomegaly. No bruits or masses. Good bowel sounds. Extremities: no cyanosis, clubbing, rash, edema  Neuro: sedated  Telemetry   Vent bigeminy 80s (Personally reviewed)    EKG    A fib 130s  Labs   Basic Metabolic Panel: Recent Labs  Lab  09/16/22 0426 09/17/22 0419 09/22/22 0927 09/22/22 1213 09/22/22 1305 09/22/22 1638  NA 132* 136 133* 139  --  132*  K 3.5 4.3 3.1* 3.4*  --  4.0  CL 103 104 105  --   --   --   CO2 20* 23 13*  --   --   --   GLUCOSE 127* 139* 127*  --   --   --   BUN 21 12 6*  --   --   --   CREATININE 0.78 0.70 0.90  --   --   --   CALCIUM 6.3* 7.1* 5.8*  --   --   --   MG  --   --  0.9*  --   --   --   PHOS  --   --   --   --  2.6  --     Liver Function Tests: Recent Labs  Lab 09/22/22 0927  AST 32  ALT 12  ALKPHOS 139*  BILITOT 1.0  PROT 5.0*  ALBUMIN 1.9*   No results for input(s): "LIPASE", "AMYLASE" in the last 168 hours. Recent Labs  Lab 09/22/22 1020  AMMONIA 23    CBC: Recent Labs  Lab 09/16/22 0426 09/17/22 0419 09/22/22 0927 09/22/22 1213 09/22/22 1638  WBC 8.0 8.4 18.4*  --   --   NEUTROABS 5.7 5.9 17.4*  --   --   HGB 10.1* 10.4* 10.3* 9.5* 10.9*  HCT 29.1* 30.5* 31.0* 28.0* 32.0*  MCV 96.7 100.3* 103.0*  --   --   PLT 185 170 218  --   --     Cardiac Enzymes: No results for input(s): "CKTOTAL", "CKMB", "CKMBINDEX", "TROPONINI" in the last 168 hours.  BNP: BNP (last 3 results) No results for input(s): "BNP" in the last 8760 hours.  ProBNP (last 3 results) No results for input(s): "PROBNP" in the last 8760 hours.   CBG: Recent Labs  Lab 09/22/22 1434  GLUCAP 211*    Coagulation Studies: Recent Labs    09/22/22 0927  LABPROT 21.0*  INR 1.8*     Imaging   CT HEAD  WO CONTRAST ( )  Result Date: 09/22/2022 CLINICAL DATA:  Provided history: Mental status change, unknown cause. Additional history provided: Cardiac arrest. EXAM: CT HEAD WITHOUT CONTRAST TECHNIQUE: Contiguous axial images were obtained from the base of the skull through the vertex without intravenous contrast. RADIATION DOSE REDUCTION: This exam was performed according to the departmental dose-optimization program which includes automated exposure control, adjustment of the mA  and/or kV according to patient size and/or use of iterative reconstruction technique. COMPARISON:  Brain MRI 03/06/2021.  Head CT 09/10/2022. FINDINGS: Residual circulating intravascular contrast is noted. Brain: Moderate generalized cerebral atrophy.  Mild cerebellar atrophy. Redemonstrated redemonstrated chronic infarct within the left corona radiata, internal capsule and basal ganglia. Redemonstrated redemonstrated small chronic lacunar infarct within the right caudate nucleus. Background advanced patchy and ill-defined hypoattenuation within the cerebral white matter, nonspecific but compatible with chronic small vessel ischemic disease. There is no acute intracranial hemorrhage. No demarcated cortical infarct. No extra-axial fluid collection. No evidence of an intracranial mass. No midline shift. Vascular: Atherosclerotic calcifications. Skull: No calvarial fracture or aggressive osseous lesion. Sinuses/Orbits: No mass or acute finding within the imaged orbits. Post-surgical appearance of the paranasal sinuses. Mild mucosal thickening within the right maxillary sinus. Small fluid level, and mild background mucosal thickening, within the left maxillary sinus. Mild mucosal thickening, and small-volume secretions, within the right sphenoid sinus. Mild mucosal thickening within bilateral ethmoid air cells. Other: Redemonstrated chronic fracture deformities of the lateral wall of the right orbit, posterolateral wall the right maxillary sinus and right zygomatic arch. IMPRESSION: 1. No CT evidence of an acute intracranial abnormality. Please note, a brain MRI would have greater sensitivity for acute hypoxic/ischemic injury. 2. Parenchymal atrophy, chronic small vessel ischemic disease and chronic infarcts as described. 3. Paranasal sinus disease as outlined. 4. Redemonstrated chronic facial fracture deformities. Electronically Signed   By: Jackey Loge D.O.   On: 09/22/2022 14:43   DG Chest Port 1 View  Result  Date: 09/22/2022 CLINICAL DATA:  placement of tube EXAM: PORTABLE CHEST 1 VIEW COMPARISON:  09/22/2022, 10 a.m. FINDINGS: There is diffusely increased vascular congestion, in very short timeframe, favoring congestive heart failure/pulmonary edema. There are bilateral pleural effusions, better seen on the CT scan chest performed just before this exam. Stable cardio-mediastinal silhouette. No acute osseous abnormalities. Upper lumbar vertebral kyphoplasty again seen. The soft tissues are within normal limits. Tube/lines: * Endotracheal tube tip is 3.5 cm from the carina. * An enteric tube extends below diaphragm, tip out of the view of the radiograph. However, the side hole overlies the left upper abdomen, within the proximal stomach. *Multiple additional monitoring electrodes noted overlying the upper abdomen. IMPRESSION: *Interval development of congestive heart failure/pulmonary edema. *Tube/lines, as described above. Electronically Signed   By: Jules Schick M.D.   On: 09/22/2022 13:18   CT Angio Chest PE W and/or Wo Contrast  Result Date: 09/22/2022 CLINICAL DATA:  Elevated D-dimer. Shortness of breath. Evaluate for pulmonary embolism. EXAM: CT ANGIOGRAPHY CHEST WITH CONTRAST TECHNIQUE: Multidetector CT imaging of the chest was performed using the standard protocol during bolus administration of intravenous contrast. Multiplanar CT image reconstructions and MIPs were obtained to evaluate the vascular anatomy. RADIATION DOSE REDUCTION: This exam was performed according to the departmental dose-optimization program which includes automated exposure control, adjustment of the mA and/or kV according to patient size and/or use of iterative reconstruction technique. CONTRAST:  75mL OMNIPAQUE IOHEXOL 350 MG/ML SOLN COMPARISON:  09/13/2022; chest radiograph-09/22/2022 FINDINGS: Vascular Findings: There is adequate opacification of the  pulmonary arterial system with the main pulmonary artery measuring 816 Hounsfield  units. There are no discrete filling defects within the pulmonary arterial tree to suggest pulmonary embolism. Normal caliber of the main pulmonary artery. Borderline cardiomegaly. Coronary artery calcifications. Scattered atherosclerotic plaque within a normal caliber thoracic aorta. Conventional configuration of the aortic arch. The descending thoracic aorta is of normal caliber. Review of the MIP images confirms the above findings. ---------------------------------------------------------------------------------- Nonvascular Findings: Mediastinum/Lymph Nodes: No bulky mediastinal, hilar or axillary lymphadenopathy Lungs/Pleura: Compared to recent chest CT performed 09/13/2022, there has been development of small to moderate-sized bilateral pleural effusions with associated dependent subsegmental atelectasis, most conspicuously involving the bilateral lower lobes. No associated air bronchograms. Redemonstrated apical predominant centrilobular emphysematous change. The central pulmonary airways appear widely patent. No discrete pulmonary nodules given parenchymal atelectasis/collapse. Upper abdomen: Limited early arterial phase evaluation of the upper abdomen demonstrates moderate-to-large sized hiatal hernia. Musculoskeletal: No acute or aggressive osseous abnormalities. Mild scoliotic curvature of the thoracolumbar spine. Sequela of previous cement augmentation of the L2 vertebral body. Redemonstrated mild (approximately 25%) compression deformity involving the superior endplate of the T3 vertebral body. Normal appearance of the thyroid gland. Regional soft tissues appear normal. IMPRESSION: 1. No evidence of pulmonary embolism. 2. Borderline cardiomegaly with interval development of small to moderate-sized grossly symmetric bilateral pleural effusions suggestive of pulmonary edema. Further evaluation with cardiac echo could be performed as indicated. 3. Coronary artery calcifications. Aortic Atherosclerosis  (ICD10-I70.0). 4.  Emphysema (ICD10-J43.9). 5. Moderate-to-large sized hiatal hernia. Electronically Signed   By: Simonne Come M.D.   On: 09/22/2022 12:24   DG Chest 2 View  Result Date: 09/22/2022 CLINICAL DATA:  Shortness of breath since yesterday and is hypoxic. Hypotensive EXAM: CHEST - 2 VIEW COMPARISON:  X-ray 09/13/2022 and CT scan FINDINGS: Stable cardiopericardial silhouette. Underinflation. New lung base opacities particularly on the left with some effusion. No pneumothorax. Chronic interstitial changes. Calcified aorta. Overlapping cardiac leads. Film is rotated to the right. Diffuse degenerative changes IMPRESSION: New small pleural effusions and adjacent opacities, left-greater-than-right. Electronically Signed   By: Karen Kays M.D.   On: 09/22/2022 10:39     Medications:     Current Medications:  insulin aspart  0-6 Units Subcutaneous Q4H   mouth rinse  15 mL Mouth Rinse Q2H   pantoprazole (PROTONIX) IV  40 mg Intravenous Daily    Infusions:  amiodarone 60 mg/hr (09/22/22 1515)   Followed by   amiodarone     ceFEPime (MAXIPIME) IV     norepinephrine (LEVOPHED) Adult infusion 25 mcg/min (09/22/22 1515)   potassium chloride 10 mEq (09/22/22 1555)   vancomycin     [START ON 09/24/2022] vancomycin        Patient Profile   Brenda [name] is a 82 y.o. female with skin cancer, HTN, HLD, anxiety, tremor, chronic diarrhea, colon polyps, and migraines. AHF team to see post VT arrest.   Assessment/Plan  Cardiac arrest 2/2 VT/VF - suspected 2/2 electrolyte imbalances in setting of cardiomyopathy - Continue amiodarone gtt 60 mg/hr  Cardiogenic shock - Takotsubo? Anterior wall infarct? Myocarditis with recent covid? - Echo EF 20% with akinesis of dist 2/3 of LV - lactic acid 2.9>8.1 - support with NE and milrinone - trend co-ox and CVP  Acute hypoxic respiratory failure - PCCM following - Intubated and sedated. Now on O2 at home.   Electrolyte imbalances - suspected 2/2  chronic diarrhea - replete K, Mg, Ca  5. Recent COVID infection - on precautions  Length of Stay: 0  Alen Bleacher, NP  09/22/2022, 4:53 PM  Advanced Heart Failure Team Pager [phone] (M-F; 7a - 5p)  Please contact [facility] for night-coverage after hours (4p -7a ) and weekends on [email or web address]  Agree with above.  Elderly, frail woman living in ALF with progressive functional decline and recent COVID illness  Now admitted with VF/torsades arrest in setting of severe electrolyte abnormalities  Now intubated/sedated with shock  Echo EF < 20% with akinesis of distal 2/3 of LV - ? Tako-tsubo vs anterior MI  ECG with ? Septal Qs. Hstrop 1K  General:  elderly frail HEENT: normal +ETT Neck: supple. no JVD.  Cor: Regular rate & rhythm. No rubs, gallops or murmurs. Lungs: clear Abdomen: soft, nontender, nondistended. No hepatosplenomegaly. No bruits or masses. Good bowel sounds. Extremities: no cyanosis, clubbing, rash, edema Neuro: intubated/sedated  Elderly frail woman with recent COVID infection now admitted with VF arrest in setting of severe electrolyte abnormalities. Based on echo and history, I suspect Tako-tsubo and not anterior MI. Will continue supportive care. Will need to decide on candidacy for cath based on course. Continue amio, supp electrolytes.   Not candidate for mechanical support.  D/w CCM.  CRITICAL CARE Performed by: Arvilla Meres  Total critical care time: 60 minutes  Critical care time was exclusive of separately billable procedures and treating other patients.  Critical care was necessary to treat or prevent imminent or life-threatening deterioration.  Critical care was time spent personally by me (independent of midlevel providers or residents) on the following activities: development of treatment plan with patient and/or surrogate as well as nursing, discussions with consultants, evaluation of patient's response to treatment, examination of  patient, obtaining history from patient or surrogate, ordering and performing treatments and interventions, ordering and review of laboratory studies, ordering and review of radiographic studies, pulse oximetry and re-evaluation of patient's condition.  Arvilla Meres, MD  5:59 PM

## 2022-09-22 NOTE — ED Notes (Signed)
CRITICAL VALUE STICKER  CRITICAL VALUE:Calcium 5.8  RECEIVER (on-site recipient of call):Preesha Benjamin Paramedic  DATE & TIME NOTIFIED: 09/22/2022  MESSENGER (representative from lab):Corrie Dandy  MD NOTIFIED: Yes, PA [place]  TIME OF NOTIFICATION:10:30  RESPONSE:

## 2022-09-22 NOTE — Progress Notes (Addendum)
eLink Physician-Brief Progress Note Patient Name: Brenda [name] DOB: March 19, 1940 MRN: [id number]   Date of Service  09/22/2022  HPI/Events of Note  82 y.o. female with skin cancer, HTN, HLD, anxiety, tremor, chronic diarrhea, colon polyps, and migraines came in in with VF cardiac arrest  Self-extubated, comfortable appearing placed on NRB, following commands, minimal secretions  eICU Interventions  Place on bipap for the time being, repeat abg in 2 hours   2313 -patient reported to be extremely anxious about the whole situation.  On examination, the patient was calm and synchronous with BiPAP without significant leak.  Resting comfortably.  If necessary and the patient has difficulty tolerating BiPAP, could use low-dose Precedex tonight.  Intervention Category Major Interventions: Respiratory failure - evaluation and management  Trinaty Bundrick 09/22/2022, 10:37 PM

## 2022-09-23 ENCOUNTER — Inpatient Hospital Stay (HOSPITAL_COMMUNITY): Payer: Medicare PPO

## 2022-09-23 DIAGNOSIS — Z7189 Other specified counseling: Secondary | ICD-10-CM

## 2022-09-23 DIAGNOSIS — Z515 Encounter for palliative care: Secondary | ICD-10-CM

## 2022-09-23 DIAGNOSIS — U071 COVID-19: Secondary | ICD-10-CM | POA: Diagnosis not present

## 2022-09-23 DIAGNOSIS — I4901 Ventricular fibrillation: Secondary | ICD-10-CM | POA: Diagnosis not present

## 2022-09-23 DIAGNOSIS — I469 Cardiac arrest, cause unspecified: Principal | ICD-10-CM

## 2022-09-23 LAB — GLUCOSE, CAPILLARY
Glucose-Capillary: 105 mg/dL — ABNORMAL HIGH (ref 70–99)
Glucose-Capillary: 134 mg/dL — ABNORMAL HIGH (ref 70–99)
Glucose-Capillary: 136 mg/dL — ABNORMAL HIGH (ref 70–99)
Glucose-Capillary: 136 mg/dL — ABNORMAL HIGH (ref 70–99)
Glucose-Capillary: 137 mg/dL — ABNORMAL HIGH (ref 70–99)
Glucose-Capillary: 155 mg/dL — ABNORMAL HIGH (ref 70–99)
Glucose-Capillary: 195 mg/dL — ABNORMAL HIGH (ref 70–99)

## 2022-09-23 LAB — COOXEMETRY PANEL
Carboxyhemoglobin: 0.3 % — ABNORMAL LOW (ref 0.5–1.5)
Methemoglobin: <0.7 % (ref 0.0–1.5)
O2 Saturation: 70.8 %
Total hemoglobin: 11.2 g/dL — ABNORMAL LOW (ref 12.0–16.0)

## 2022-09-23 LAB — BASIC METABOLIC PANEL
Anion gap: 10 (ref 5–15)
Anion gap: 11 (ref 5–15)
Anion gap: 13 (ref 5–15)
BUN: 6 mg/dL — ABNORMAL LOW (ref 8–23)
BUN: 5 mg/dL — ABNORMAL LOW (ref 8–23)
BUN: 6 mg/dL — ABNORMAL LOW (ref 8–23)
CO2: 14 mmol/L — ABNORMAL LOW (ref 22–32)
CO2: 15 mmol/L — ABNORMAL LOW (ref 22–32)
CO2: 17 mmol/L — ABNORMAL LOW (ref 22–32)
Calcium: 7.2 mg/dL — ABNORMAL LOW (ref 8.9–10.3)
Calcium: 7.2 mg/dL — ABNORMAL LOW (ref 8.9–10.3)
Calcium: 7.1 mg/dL — ABNORMAL LOW (ref 8.9–10.3)
Chloride: 105 mmol/L (ref 98–111)
Chloride: 104 mmol/L (ref 98–111)
Chloride: 102 mmol/L (ref 98–111)
Creatinine, Ser: 0.93 mg/dL (ref 0.44–1.00)
Creatinine, Ser: 0.94 mg/dL (ref 0.44–1.00)
Creatinine, Ser: 0.94 mg/dL (ref 0.44–1.00)
GFR, Estimated: >60 mL/min (ref >60)
GFR, Estimated: >60 mL/min (ref >60)
GFR, Estimated: >60 mL/min (ref >60)
Glucose, Bld: 145 mg/dL — ABNORMAL HIGH (ref 70–99)
Glucose, Bld: 112 mg/dL — ABNORMAL HIGH (ref 70–99)
Glucose, Bld: 120 mg/dL — ABNORMAL HIGH (ref 70–99)
Potassium: 3.3 mmol/L — ABNORMAL LOW (ref 3.5–5.1)
Potassium: 4.2 mmol/L (ref 3.5–5.1)
Potassium: 3.8 mmol/L (ref 3.5–5.1)
Sodium: 129 mmol/L — ABNORMAL LOW (ref 135–145)
Sodium: 130 mmol/L — ABNORMAL LOW (ref 135–145)
Sodium: 132 mmol/L — ABNORMAL LOW (ref 135–145)

## 2022-09-23 LAB — POCT I-STAT 7, (LYTES, BLD GAS, ICA,H+H)
Acid-base deficit: 8 mmol/L — ABNORMAL HIGH (ref 0.0–2.0)
Acid-base deficit: 8 mmol/L — ABNORMAL HIGH (ref 0.0–2.0)
Bicarbonate: 14.8 mmol/L — ABNORMAL LOW (ref 20.0–28.0)
Bicarbonate: 15.1 mmol/L — ABNORMAL LOW (ref 20.0–28.0)
Calcium, Ion: 1.1 mmol/L — ABNORMAL LOW (ref 1.15–1.40)
Calcium, Ion: 1.12 mmol/L — ABNORMAL LOW (ref 1.15–1.40)
HCT: 30 % — ABNORMAL LOW (ref 36.0–46.0)
HCT: 33 % — ABNORMAL LOW (ref 36.0–46.0)
Hemoglobin: 10.2 g/dL — ABNORMAL LOW (ref 12.0–15.0)
Hemoglobin: 11.2 g/dL — ABNORMAL LOW (ref 12.0–15.0)
O2 Saturation: 98 %
O2 Saturation: 98 %
Patient temperature: 36.8
Patient temperature: 37
Potassium: 3.5 mmol/L (ref 3.5–5.1)
Potassium: 3.5 mmol/L (ref 3.5–5.1)
Sodium: 131 mmol/L — ABNORMAL LOW (ref 135–145)
Sodium: 132 mmol/L — ABNORMAL LOW (ref 135–145)
TCO2: 16 mmol/L — ABNORMAL LOW (ref 22–32)
TCO2: 16 mmol/L — ABNORMAL LOW (ref 22–32)
pCO2 arterial: 22.4 mmHg — ABNORMAL LOW (ref 32–48)
pCO2 arterial: 22.8 mmHg — ABNORMAL LOW (ref 32–48)
pH, Arterial: 7.421 (ref 7.35–7.45)
pH, Arterial: 7.436 (ref 7.35–7.45)
pO2, Arterial: 89 mmHg (ref 83–108)
pO2, Arterial: 98 mmHg (ref 83–108)

## 2022-09-23 LAB — MAGNESIUM
Magnesium: 1.9 mg/dL (ref 1.7–2.4)
Magnesium: 2.5 mg/dL — ABNORMAL HIGH (ref 1.7–2.4)
Magnesium: 2.1 mg/dL (ref 1.7–2.4)

## 2022-09-23 LAB — CBC
HCT: 29.8 % — ABNORMAL LOW (ref 36.0–46.0)
Hemoglobin: 10.1 g/dL — ABNORMAL LOW (ref 12.0–15.0)
MCH: 32.6 pg (ref 26.0–34.0)
MCHC: 33.9 g/dL (ref 30.0–36.0)
MCV: 96.1 fL (ref 80.0–100.0)
Platelets: 228 10*3/uL (ref 150–400)
RBC: 3.1 MIL/uL — ABNORMAL LOW (ref 3.87–5.11)
RDW: 14.4 % (ref 11.5–15.5)
WBC: 13.3 10*3/uL — ABNORMAL HIGH (ref 4.0–10.5)
nRBC: 0 % (ref 0.0–0.2)

## 2022-09-23 LAB — PROCALCITONIN: Procalcitonin: 25.84 ng/mL

## 2022-09-23 LAB — PROTIME-INR
INR: 2.1 — ABNORMAL HIGH (ref 0.8–1.2)
Prothrombin Time: 23.4 s — ABNORMAL HIGH (ref 11.4–15.2)

## 2022-09-23 LAB — TROPONIN I (HIGH SENSITIVITY)
Troponin I (High Sensitivity): 1409 ng/L (ref <18)
Troponin I (High Sensitivity): 1193 ng/L (ref <18)

## 2022-09-23 MED ORDER — ORAL CARE MOUTH RINSE: 15.0000 mL | OROMUCOSAL | Status: DC | PRN

## 2022-09-23 MED ORDER — PRIMIDONE 50 MG PO TABS
50.0000 mg | ORAL_TABLET | Freq: Three times a day (TID) | ORAL | Status: DC
  Administered 2022-09-23 – 2022-09-24 (×2): 50 mg via ORAL
  Filled 2022-09-23 (×7): qty 1

## 2022-09-23 MED ORDER — POTASSIUM CHLORIDE 10 MEQ/100ML IV SOLN: 10.0000 meq | INTRAVENOUS | Status: DC

## 2022-09-23 MED ORDER — POTASSIUM CHLORIDE 20 MEQ PO PACK: 20.0000 meq | PACK | ORAL | Status: DC

## 2022-09-23 MED ORDER — MAGNESIUM SULFATE 2 GM/50ML IV SOLN
2.0000 g | Freq: Once | INTRAVENOUS | Status: AC
  Administered 2022-09-23: 2 g via INTRAVENOUS
  Filled 2022-09-23: qty 50

## 2022-09-23 MED ORDER — VASOPRESSIN 20 UNITS/100 ML INFUSION FOR SHOCK
0.0000 [IU]/min | INTRAVENOUS | Status: DC
  Administered 2022-09-23 – 2022-09-24 (×3): 0.03 [IU]/min via INTRAVENOUS
  Filled 2022-09-23 (×3): qty 100

## 2022-09-23 MED ORDER — HEPARIN (PORCINE) 25000 UT/250ML-% IV SOLN
600.0000 [IU]/h | INTRAVENOUS | Status: DC
  Administered 2022-09-23: 600 [IU]/h via INTRAVENOUS
  Filled 2022-09-23: qty 250

## 2022-09-23 MED ORDER — POTASSIUM CHLORIDE 10 MEQ/50ML IV SOLN
10.0000 meq | INTRAVENOUS | Status: AC
  Administered 2022-09-23 (×4): 10 meq via INTRAVENOUS
  Filled 2022-09-23 (×4): qty 50

## 2022-09-23 MED ORDER — MORPHINE SULFATE (PF) 2 MG/ML IV SOLN
2.0000 mg | INTRAVENOUS | Status: DC | PRN
  Administered 2022-09-23 – 2022-09-24 (×4): 2 mg via INTRAVENOUS
  Filled 2022-09-23 (×4): qty 1

## 2022-09-23 MED ORDER — FUROSEMIDE 10 MG/ML IJ SOLN
60.0000 mg | Freq: Once | INTRAMUSCULAR | Status: AC
  Administered 2022-09-23: 60 mg via INTRAVENOUS
  Filled 2022-09-23: qty 6

## 2022-09-23 MED ORDER — MORPHINE SULFATE (PF) 2 MG/ML IV SOLN
1.0000 mg | INTRAVENOUS | Status: DC | PRN
  Administered 2022-09-23: 1 mg via INTRAVENOUS
  Filled 2022-09-23: qty 1

## 2022-09-23 MED ORDER — POTASSIUM CHLORIDE 20 MEQ PO PACK
20.0000 meq | PACK | ORAL | Status: DC
  Filled 2022-09-23: qty 1

## 2022-09-23 MED ORDER — ORAL CARE MOUTH RINSE
15.0000 mL | OROMUCOSAL | Status: DC
  Administered 2022-09-23 (×3): 15 mL via OROMUCOSAL

## 2022-09-23 MED ORDER — NOREPINEPHRINE 16 MG/250ML-% IV SOLN
0.0000 ug/min | INTRAVENOUS | Status: DC
  Administered 2022-09-23: 20 ug/min via INTRAVENOUS
  Administered 2022-09-23: 18 ug/min via INTRAVENOUS
  Administered 2022-09-24: 17 ug/min via INTRAVENOUS
  Filled 2022-09-23 (×3): qty 250

## 2022-09-23 NOTE — Plan of Care (Signed)
  Problem: Education: Goal: Knowledge of risk factors and measures for prevention of condition will improve Outcome: Progressing   Problem: Coping: Goal: Psychosocial and spiritual needs will be supported Outcome: Progressing   Problem: Respiratory: Goal: Will maintain a patent airway Outcome: Progressing   Problem: Role Relationship: Goal: Method of communication will improve Outcome: Progressing   Problem: Clinical Measurements: Goal: Will remain free from infection Outcome: Progressing Goal: Respiratory complications will improve Outcome: Progressing   Problem: Coping: Goal: Level of anxiety will decrease Outcome: Progressing

## 2022-09-23 NOTE — Consult Note (Signed)
Consultation Note Date: 09/23/2022   Patient Name: Brenda [name]  DOB: 1940-11-07  MRN: [id number]  Age / Sex: 82 y.o., female  PCP: Clovis Riley, L.August Saucer, MD Referring Physician: Steffanie Dunn, DO  Reason for Consultation: Establishing goals of care  HPI/Patient Profile: 82 y.o. female  with past medical history of HTN, HLD, anxiety, chronic diarrhea, tremor, skin cancer, migraines, osteoporosis admitted on 09/22/2022 with dyspnea x 1 day and hypotension.   Patient has been hospitalized 3 times in the past 6 months for COVID, AKI/electrolyte disturbances, abdominal pain. She arrived to the ED and during work up decompensated to vfib arrest. ROSC obtained and shortly after, pt suffered second PEA arrest with ROSC and intubated. Post arrest CXR with development of pulmonary edema. Requiring norepinephrine for refractory hypotension/cardiogenic shock. Patient now with poor prognosis and acute hypoxic respiratory failure, acute HFrEF, afib with RVR, failure to thrive. PMT has been consulted to assist with goals of care conversation.  Clinical Assessment and Goals of Care:  I have reviewed medical records including EPIC notes, labs and imaging, discussed with RN, assessed the patient and then met at the bedside with patient's husband to discuss diagnosis prognosis, GOC, EOL wishes, disposition and options. We were then joined by niece Brenda [name] as well.  I introduced Palliative Medicine as specialized medical care for people living with serious illness. It focuses on providing relief from the symptoms and stress of a serious illness. The goal is to improve quality of life for both the patient and the family.  We discussed a brief life review of the patient and then focused on their current illness.   I attempted to elicit values and goals of care important to the patient.    Medical History Review and Understanding:  Discussed  severity of patient's acute illness in the context of her chronic comorbidities. Patient and family understand severity, though husband acknowledges he has always had a preference to "focus on the positives."  Social History: Patient has been married to her husband for 55 years. They have traveled the world extensively and enjoyed a long, happy life. She currently resides at [facility] rehab. They are supported by their niece and nephew, church members. She is a International aid/development worker.   Functional and Nutritional State: Patient has had declining health for several years. Husband reports that she was making some progress with mobility and walked with assistance the day before this hospitalization. Low albumin of 1.6 noted.   Palliative Symptoms: Chest pain, anxiety  Advance Directives: No documentation currently on file.  Code Status: Concepts specific to code status, artifical feeding and hydration, and rehospitalization were considered and discussed.  Patient is very clear in her desire for DNR/DNI, no aggressive interventions or artificial support.  Discussion: Patient is quite frustrated and angry today, feeling that she is not being listened to at all. She is in disagreement with her husband about what the priority of her care should be moving forward. She feels he would let her be "a vegetable" and keep her alive at all costs, which is not how she wants to live at the end of her life. Patient shares that her quality of life has already been unacceptable for quite some time, as she has been dependent on others for care and not able to contribute to helping her own loved ones like she used to. She is proud of supporting her husband and does not feel she is herself if she cannot remain "active and lively."  Patient understands that even in  the best possible outcome, she will likely become more debilitated than her previous baseline and remain at risk for additional complications and prolonged suffering. We  discussed that she is still at high risk for death and she understands this, adding that she is not afraid to die. She is ready to go home to [facility] and tired of being "constantly poked and prodded at."  Her husband feels that she is "choosing to die" and we discussed that her body has already made this decision. Her body has ultimately been through too much for too many years and the choice becomes how much additional stress we put her through for little to no benefit to her quality of life. Counseled on the appropriateness of de-escalated care to focus on peace, comfort and dignity as the natural disease process continues, rather than attempting to lengthen quantity of life with aggressive interventions.  Brenda [name] would like to hear the doctor's insight and move forward with heart cath before making any decisions. Brenda [name] is willing to do this for another day at the most. Both are in agreement with honoring her wishes for DNR/DNI in the meantime. They will also be speaking with a minister this afternoon and additional family members will assist Brenda [name] in her desire to help Brenda [name] come to terms with this terribly difficult situation. Emotional support and therapeutic listening was provided.   The difference between aggressive medical intervention and comfort care was considered in light of the patient's goals of care.   Discussed the importance of continued conversation with family and the medical providers regarding overall plan of care and treatment options, ensuring decisions are within the context of the patient's values and GOCs.   Questions and concerns were addressed.  Hard Choices booklet left for review. The family was encouraged to call with questions or concerns.  PMT will continue to support holistically.   SUMMARY OF RECOMMENDATIONS   -Code status changed to DNR/DNI per patient's wishes -Continue full scope treatment for another 24 hours. Patient is clear on her desire for comfort-focused care,  though she is willing to proceed with cath as her husband comes to terms with her prognosis -Ongoing goals of care discussions -Psychosocial and emotional support provided -PMT will continue to follow and support   Prognosis:  Poor prognosis given several acute complications and several chronic comorbidities with progressive decline  Discharge Planning: To Be Determined      Primary Diagnoses: Present on Admission:  Cardiac arrest [facility])   Physical Exam Vitals and nursing note reviewed.  Constitutional:      General: She is not in acute distress. Cardiovascular:     Rate and Rhythm: Normal rate.  Pulmonary:     Effort: Pulmonary effort is normal. No respiratory distress.     Comments: Initially on BiPAP. Transitioned to [place] during conversation Neurological:     Mental Status: She is alert and oriented to person, place, and time.  Psychiatric:        Mood and Affect: Mood is anxious.        Behavior: Behavior is cooperative.        Thought Content: Thought content normal.        Cognition and Memory: Cognition normal.    Vital Signs: BP (!) 104/51   Pulse 87   Temp 98.1 F (36.7 C)   Resp 14   Ht 5\' 4"  (1.626 m)   Wt 52.2 kg   SpO2 99%   BMI 19.75 kg/m  Pain Scale: 0-10  Pain Score: 0-No pain   SpO2: SpO2: 99 % O2 Device:SpO2: 99 % O2 Flow Rate: .O2 Flow Rate (L/min): 6 L/min    Total time: I spent 105 minutes in the care of the patient today in the above activities and documenting the encounter.  MDM: High   Nilson Tabora Jeni Salles, PA-C  Palliative Medicine Team Team phone # [phone]  Thank you for allowing the Palliative Medicine Team to assist in the care of this patient. Please utilize secure chat with additional questions, if there is no response within 30 minutes please call the above phone number.  Palliative Medicine Team providers are available by phone from 7am to 7pm daily and can be reached through the team cell phone.  Should this  patient require assistance outside of these hours, please call the patient's attending physician.

## 2022-09-23 NOTE — Progress Notes (Addendum)
NAME:  Brenda [name], MRN:  [id number], DOB:  20-Oct-1940, LOS: 1 ADMISSION DATE:  09/22/2022, CONSULTATION DATE:  09/22/2022 REFERRING MD:  Dr. Hyacinth Meeker, CHIEF COMPLAINT:  cardiac arrest   History of Present Illness:   81yoF with PMH of HTN, HLD, anxiety, chronic diarrhea, and tremor presenting from [facility] assisted living for SOB for reported one day, found to be hypoxic and hypotensive with EMS treated with NS bolus.  Patient recently hospitalized in June for AKI, electrolyte derangements, wt loss, and abdominal pain and again 8/26-8/30 with worsening N/V/D after recent COVID dx (? 8/22) with AKI, multiple electrolyte derangements discharged to rehab.    On arrival to ER, pt denied complaints, was alert but slow to answer questions, noted to be afebrile, tachypneic, tachycardic, and normotensive.  CXR showed L> R bibasilar opacities and new small pleural effusion.  Labs with K 3.1, corrected Ca 7.5, bicarb 13, lactic 2.9, WBC 18.4, INR 1.8, treated with calcium gluconate and additional NS bolus for soft BP, improved after bolus. Was being rule out for possible PE given positive D-dimer and hypoxia.  On return from CT, patient developed rapid narrow complex rate then decompensated to vfib arrest.  ROSC obtained after CPR, 2 defibrillations and epi.  Amiodarone bolus and magnesium given.  Shortly after, pt suffered second PEA arrest with ROSC after one round and intubated after RSI.  Post arrest CXR with development of pulmonary edema, satisfactory tube placement.  Requiring norepinephrine for refractory hypotension. PCCM consulted for admit.   Husband at bedside but given he is in AIL and she is in rehab part, unaware of any acute recent events overnight.   Pertinent  Medical History  HTN, HLD, anxiety/ depression, IBS/ chronic diarrhea, GERD, hiatal hernia s/p Nissen fundoplication 2017, tremor, chronic vertebral fractures  Significant Hospital Events: Including procedures, antibiotic start  and stop dates in addition to other pertinent events   9/4 Admit SOB/ cardiac arrest  9/5 self extubated overnight on bipap  Interim History / Subjective:  Self extubated last night and placed on bipap  Coox 71%, cvp 6-10; on 24 NE and 0.125 milrinone UOP 400 last 24 hours  Objective   Blood pressure (!) 109/53, pulse 79, temperature 98.1 F (36.7 C), resp. rate 17, height 5\' 4"  (1.626 m), weight 52.2 kg, SpO2 96%. CVP:  [5 mmHg-13 mmHg] 8 mmHg  Vent Mode: BIPAP;PCV FiO2 (%):  [40 %-100 %] 40 % Set Rate:  [12 bmp-30 bmp] 12 bmp Vt Set:  [450 mL] 450 mL PEEP:  [5 cmH20] 5 cmH20 Plateau Pressure:  [17 cmH20-18 cmH20] 17 cmH20   Intake/Output Summary (Last 24 hours) at 09/23/2022 0919 Last data filed at 09/23/2022 0800 Gross per 24 hour  Intake 4343.17 ml  Output 435 ml  Net 3908.17 ml   Filed Weights   09/23/22 0445  Weight: 52.2 kg    Examination: General:  ill appearing female on bipap  HEENT: MM pink/moist; bipap in place Neuro: AO; MAE CV: s1s2, rate 80s, no m/r/g PULM:  dim clear BS bilaterally; on bipap GI: soft, bsx4 active  Extremities: warm/dry, no edema  Skin: no rashes or lesions   Resolved Hospital Problem list    Assessment & Plan:   Vfib/ PEA cardiac arrest  Cardiogenic Shock; likely multifactorial w/ septic and hypovolemic Afib with RVR> new Acute HFrEF: EF 20% - on tele review> appears pt was in afib with RVR and had PVC on twave > vfib initially with multiple severe metabolic derangements>hypomagnesemia, hypokalemia,  hypocalcemia P:  -HF team consulted; appreciate recs -cont levo for map goal >65 -cont milrinone -trend coox and cvp -cont amio -replete K and mag this am; trend electrolytes and replete as needed -will need heart cath when stable -diuresis per HF -cont cefepime; consider dc'ing vanc w/ mrsa pcr negative -f/u cultures -consider heparin gtt given NSTEMI and vfib arrest  Multiple electrolyze derangements> likely related to chronic  diarrhea, malnutrition P:  -giving mag and K this am -trend bmp and mag  Acute hypoxic respiratory failure  r/o HCAP vs aspiration PNA Small-moderate b/l pleural effusion and pulm edema Recent COVID (? Dx 8/22, tx with paxlovid) - suspected related pulmonary edema given acute CXR changes with possible AoC HF exacerbation vs third spacing due to malnutrition with new small bilateral pleural effusions (lasix recently stopped) P:  -self extubated overnight and placed on bipap -will try on [place] today if tolerated; bipap prn -diuresis today -trend CXR -abx as above -pulm toiletry -PT/OT -CXR in am -airborne precautions  Acute metabolic encephalopathy: likely due to above -CTH negative P:  -improved; awake and following commands -limit sedating meds  High risk for AKI given hypotension/ cardiac arrest lactic acidosis  Chronic NAGMA likely 2/2 chronic diarrhea P:  -consider resuming home bicarb tabs if able to take po -lasix today -creat stable -Trend BMP / urinary output -Replace electrolytes as indicated -Avoid nephrotoxic agents, ensure adequate renal perfusion  Coagulapathy, mild P: -trend coags  Hx HTN, HLD P: -hold home bp meds w/ hypotension -consider resuming statin if able to take po  Protein calorie malnutrition P: -RD consult  Chronic diarrhea P: - hold questran/ budesonide  - monitor BM  GERD P: - PPI   Anxiety/ depression P: - hold cymbalta -consider resuming home xanax  Suspected Failure to thrive given recent documentation of weight loss and functional decline P: -palliative care consulted; spoke with patient and husband. Decision made to be DNR. Husband would like to continue current care for now and maybe wait on heart cath results. Patient is ready to go comfort but willing to wait a few more days. Palliative will continue to follow. Appreciate assistance.   Best Practice (right click and "Reselect all SmartList Selections" daily)    Diet/type: NPO DVT prophylaxis: systemic heparin GI prophylaxis: PPI Lines: Central line and Arterial Line Foley:  Yes, and it is still needed Code Status:  DNR Last date of multidisciplinary goals of care discussion [9/5 see palliative note]   Labs   CBC: Recent Labs  Lab 09/17/22 0419 09/22/22 0927 09/22/22 1213 09/22/22 1638 09/23/22 0023 09/23/22 0414 09/23/22 0445  WBC 8.4 18.4*  --   --   --  13.3*  --   NEUTROABS 5.9 17.4*  --   --   --   --   --   HGB 10.4* 10.3* 9.5* 10.9* 11.2* 10.1* 10.2*  HCT 30.5* 31.0* 28.0* 32.0* 33.0* 29.8* 30.0*  MCV 100.3* 103.0*  --   --   --  96.1  --   PLT 170 218  --   --   --  228  --     Basic Metabolic Panel: Recent Labs  Lab 09/17/22 0419 09/22/22 0927 09/22/22 1213 09/22/22 1305 09/22/22 1638 09/22/22 1700 09/23/22 0023 09/23/22 0414 09/23/22 0445  NA 136 133*   < >  --  132* 131* 131* 129* 132*  K 4.3 3.1*   < >  --  4.0 4.1 3.5 3.3* 3.5  CL 104 105  --   --   --  100  --  105  --   CO2 23 13*  --   --   --  15*  --  14*  --   GLUCOSE 139* 127*  --   --   --  278*  --  145*  --   BUN 12 6*  --   --   --  5*  --  6*  --   CREATININE 0.70 0.90  --   --   --  1.03*  --  0.93  --   CALCIUM 7.1* 5.8*  --   --   --  6.7*  --  7.2*  --   MG  --  0.9*  --   --   --  2.5*  --  1.9  --   PHOS  --   --   --  2.6  --   --   --   --   --    < > = values in this interval not displayed.   GFR: Estimated Creatinine Clearance: 39.8 mL/min (by C-G formula based on SCr of 0.93 mg/dL). Recent Labs  Lab 09/17/22 0419 09/22/22 0927 09/22/22 0933 09/22/22 1212 09/22/22 1305 09/22/22 1700 09/23/22 0414  PROCALCITON  --   --   --   --  8.13  --  25.84  WBC 8.4 18.4*  --   --   --   --  13.3*  LATICACIDVEN  --   --  2.9* 8.1*  --  2.6*  --     Liver Function Tests: Recent Labs  Lab 09/22/22 0927 09/22/22 1700  AST 32 77*  ALT 12 26  ALKPHOS 139* 134*  BILITOT 1.0 0.8  PROT 5.0* 4.8*  ALBUMIN 1.9* 1.6*   No results  for input(s): "LIPASE", "AMYLASE" in the last 168 hours. Recent Labs  Lab 09/22/22 1020  AMMONIA 23    ABG    Component Value Date/Time   PHART 7.436 09/23/2022 0445   PCO2ART 22.4 (L) 09/23/2022 0445   PO2ART 89 09/23/2022 0445   HCO3 15.1 (L) 09/23/2022 0445   TCO2 16 (L) 09/23/2022 0445   ACIDBASEDEF 8.0 (H) 09/23/2022 0445   O2SAT 70.8 09/23/2022 0455     Coagulation Profile: Recent Labs  Lab 09/22/22 0927 09/23/22 0414  INR 1.8* 2.1*    Cardiac Enzymes: No results for input(s): "CKTOTAL", "CKMB", "CKMBINDEX", "TROPONINI" in the last 168 hours.  HbA1C: Hgb A1c MFr Bld  Date/Time Value Ref Range Status  03/27/2015 04:01 AM 6.3 (H) 4.8 - 5.6 % Final    Comment:    (NOTE)         Pre-diabetes: 5.7 - 6.4         Diabetes: >6.4         Glycemic control for adults with diabetes: <7.0     CBG: Recent Labs  Lab 09/22/22 1434 09/22/22 2036 09/23/22 0018 09/23/22 0440 09/23/22 0753  GLUCAP 211* 192* 195* 155* 105*    Review of Systems:   Unable as patient is encephalopathic, intubated on MV  Past Medical History:  She,  has a past medical history of Anxiety, Chronic diarrhea, Coarse tremors, Colon polyp, DDD (degenerative disc disease), cervical, Hiatal hernia, History of skin cancer, Hypercholesteremia, Hypertension, Migraines, Osteoporosis, Shortness of breath dyspnea, Tremor, and Weight loss.   Surgical History:   Past Surgical History:  Procedure Laterality Date   BIOPSY  07/10/2022   Procedure: BIOPSY;  Surgeon: Charlott Rakes, MD;  Location: [facility];  Service: Gastroenterology;;   ESOPHAGEAL MANOMETRY N/A 01/15/2015   Procedure: ESOPHAGEAL MANOMETRY (EM);  Surgeon: Graylin Shiver, MD;  Location: [facility];  Service: Endoscopy;  Laterality: N/A;   ESOPHAGOGASTRODUODENOSCOPY (EGD) WITH PROPOFOL Left 07/10/2022   Procedure: ESOPHAGOGASTRODUODENOSCOPY (EGD) WITH PROPOFOL;  Surgeon: Charlott Rakes, MD;  Location: [facility];  Service:  Gastroenterology;  Laterality: Left;   HIATAL HERNIA REPAIR     KYPHOPLASTY N/A 12/22/2017   Procedure: LUMBAR TWO KYPHOPLASTY;  Surgeon: Estill Bamberg, MD;  Location: [facility] OR;  Service: Orthopedics;  Laterality: N/A;  LUMBAR TWO KYPHOPLASTY   RHINOPLASTY     SINUS SURGERY WITH INSTATRAK       Social History:   reports that she quit smoking about 34 years ago. Her smoking use included cigarettes. She has never used smokeless tobacco. She reports current alcohol use. She reports that she does not use drugs.   Family History:  Her family history includes COPD in her father; Hypertension in her brother; Lung cancer in her mother; Tremor in her brother and father.   Allergies Allergies  Allergen Reactions   Macrodantin [Nitrofurantoin Macrocrystal] Shortness Of Breath, Itching, Nausea And Vomiting, Swelling, Rash and Other (See Comments)    Tongue swelling   Topamax [Topiramate] Other (See Comments)    Loss of taste, fatigue!!!!!!   Diclofenac Other (See Comments)    Stomach burning   Tramadol Other (See Comments)    Stomach burning   Rice Nausea And Vomiting   Rosemary Oil Nausea And Vomiting   Codeine Nausea And Vomiting     Home Medications  Prior to Admission medications   Medication Sig Start Date End Date Taking? Authorizing Provider  albuterol (VENTOLIN HFA) 108 (90 Base) MCG/ACT inhaler Inhale 2 puffs into the lungs every 4 (four) hours as needed for shortness of breath or wheezing. 07/26/22   [provider]  ALPRAZolam Prudy Feeler) 0.25 MG tablet Take 1 tablet (0.25 mg total) by mouth daily as needed (tremors). 08/23/19   Narda Bonds, MD  atorvastatin (LIPITOR) 20 MG tablet Take 20 mg by mouth daily.    [provider]  budesonide (ENTOCORT EC) 3 MG 24 hr capsule Take 9 mg by mouth in the morning.    [provider]  carvedilol (COREG) 6.25 MG tablet Take 0.5 tablets (3.125 mg total) by mouth 2 (two) times daily with a meal. 09/17/22 10/17/22  Dahal,  Melina Schools, MD  cholestyramine light (PREVALITE) 4 g packet Take 1 packet (4 g total) by mouth 3 (three) times daily. 09/17/22 10/17/22  Lorin Glass, MD  cyclobenzaprine (FLEXERIL) 5 MG tablet Take 5 mg by mouth as needed for muscle spasms (may repeat after 3-4 hours; max of 4 tablets in a day). 08/11/22   [provider]  DULoxetine (CYMBALTA) 20 MG capsule Take 20 mg by mouth daily.    [provider]  feeding supplement (ENSURE ENLIVE / ENSURE PLUS) LIQD Take 237 mLs by mouth 2 (two) times daily between meals. 09/17/22   Lorin Glass, MD  loperamide (IMODIUM A-D) 2 MG tablet Take 4-6 mg by mouth See admin instructions. Take 6 mg by mouth in the morning and an additional 4 mg with each subsequent loose stool- OR AS OTHERWISE INSTRUCTED    [provider]  pantoprazole (PROTONIX) 40 MG tablet Take 1 tablet (40 mg total) by mouth daily. Follow up with gastroenterology 07/15/22 09/14/22  Zigmund Daniel., MD  primidone (MYSOLINE) 50 MG tablet Take 2 tablets (100 mg total) by  mouth 3 (three) times daily. Must keep follow up 07/07/2020 for ongoing refills Patient taking differently: Take 100 mg by mouth in the morning, at noon, and at bedtime. 06/04/20   Butch Penny, NP  sodium bicarbonate 650 MG tablet Take 1 tablet (650 mg total) by mouth 3 (three) times daily for 5 days. 09/17/22 09/22/22  Lorin Glass, MD  Vitamin D, Ergocalciferol, (DRISDOL) 1.25 MG (50000 UNIT) CAPS capsule Take 50,000 Units by mouth every Monday.    [provider]     Critical care time: 35 mins     JD Anselm Lis [facility] 09/23/2022, 2:07 PM  Please see [email or web address] for pager details.  From 7A-7P if no response, please call [phone]. After hours, please call ELink [phone].

## 2022-09-23 NOTE — Progress Notes (Signed)
   09/23/22 2100  BiPAP/CPAP/SIPAP  BiPAP/CPAP/SIPAP Pt Type Adult  BiPAP/CPAP/SIPAP SERVO  Reason BIPAP/CPAP not in use Non-compliant  Mask Type Full face mask  Mask Size Small

## 2022-09-23 NOTE — Progress Notes (Signed)
As per day shift, NP would discontinue EEG if pt returned to baseline. After visiting pt room, pt is back to baseline as per RN. Checking status of eeg order

## 2022-09-23 NOTE — Progress Notes (Addendum)
Advanced Heart Failure Rounding Note  PCP-Cardiologist: None   Subjective:   9/4: Admitted with cardiogenic shock post VT/VF arrest 2/2 electrolyte imbalances.   On NE @ 24 and milrinone 0.125. SBP 90s- low 100s. Co-ox 71%. CVP 6-10  Resting in bed with CPAP on. Husband at bedside.   Objective:   Weight Range: 52.2 kg Body mass index is 19.75 kg/m.   Vital Signs:   Temp:  [97.7 F (36.5 C)-99 F (37.2 C)] 98.2 F (36.8 C) (09/05 0830) Pulse Rate:  [30-169] 75 (09/05 0830) Resp:  [8-30] 16 (09/05 0830) BP: (54-146)/(32-99) 103/53 (09/05 0830) SpO2:  [77 %-100 %] 100 % (09/05 0830) Arterial Line BP: (57-122)/(38-68) 116/48 (09/05 0830) FiO2 (%):  [40 %-100 %] 40 % (09/05 0328) Weight:  [52.2 kg] 52.2 kg (09/05 0445) Last BM Date :  (PTA)  Weight change: Filed Weights   09/23/22 0445  Weight: 52.2 kg    Intake/Output:   Intake/Output Summary (Last 24 hours) at 09/23/2022 0908 Last data filed at 09/23/2022 0800 Gross per 24 hour  Intake 4343.17 ml  Output 435 ml  Net 3908.17 ml    CVP 6-10 (resp variation with CPAP) Physical Exam  General:  frail/elderly appearing.   HEENT: normal. + CPAP Neck: supple. JVD elevated. Carotids 2+ bilat; no bruits. No lymphadenopathy or thyromegaly appreciated. RIJ CVC Cor: PMI nondisplaced. Regular rate & rhythm. No rubs, gallops or murmurs. Lungs: diminished Abdomen: soft, nontender, nondistended. No hepatosplenomegaly. No bruits or masses. Good bowel sounds. Extremities: no cyanosis, clubbing, rash, edema. L radial a line GU: +foley Neuro: alert & oriented x 3, cranial nerves grossly intact. moves all 4 extremities w/o difficulty. Affect pleasant.   Telemetry   NSR 70s. 0-20 PVCs/hr (Personally reviewed)    EKG    No new EKG to review  Labs    CBC Recent Labs    09/22/22 0927 09/22/22 1213 09/23/22 0414 09/23/22 0445  WBC 18.4*  --  13.3*  --   NEUTROABS 17.4*  --   --   --   HGB 10.3*   < > 10.1* 10.2*  HCT  31.0*   < > 29.8* 30.0*  MCV 103.0*  --  96.1  --   PLT 218  --  228  --    < > = values in this interval not displayed.   Basic Metabolic Panel Recent Labs    40/98/11 1305 09/22/22 1638 09/22/22 1700 09/23/22 0023 09/23/22 0414 09/23/22 0445  NA  --    < > 131*   < > 129* 132*  K  --    < > 4.1   < > 3.3* 3.5  CL  --   --  100  --  105  --   CO2  --   --  15*  --  14*  --   GLUCOSE  --   --  278*  --  145*  --   BUN  --   --  5*  --  6*  --   CREATININE  --   --  1.03*  --  0.93  --   CALCIUM  --   --  6.7*  --  7.2*  --   MG  --   --  2.5*  --  1.9  --   PHOS 2.6  --   --   --   --   --    < > = values in this interval not displayed.  Liver Function Tests Recent Labs    09/22/22 0927 09/22/22 1700  AST 32 77*  ALT 12 26  ALKPHOS 139* 134*  BILITOT 1.0 0.8  PROT 5.0* 4.8*  ALBUMIN 1.9* 1.6*   No results for input(s): "LIPASE", "AMYLASE" in the last 72 hours. Cardiac Enzymes No results for input(s): "CKTOTAL", "CKMB", "CKMBINDEX", "TROPONINI" in the last 72 hours.  BNP: BNP (last 3 results) Recent Labs    09/22/22 1700  BNP 3,004.2*    ProBNP (last 3 results) No results for input(s): "PROBNP" in the last 8760 hours.   D-Dimer Recent Labs    09/22/22 0927  DDIMER 5.11*   Hemoglobin A1C No results for input(s): "HGBA1C" in the last 72 hours. Fasting Lipid Panel No results for input(s): "CHOL", "HDL", "LDLCALC", "TRIG", "CHOLHDL", "LDLDIRECT" in the last 72 hours. Thyroid Function Tests No results for input(s): "TSH", "T4TOTAL", "T3FREE", "THYROIDAB" in the last 72 hours.  Invalid input(s): "FREET3"  Other results:   Imaging    DG Chest Port 1 View  Result Date: 09/23/2022 CLINICAL DATA:  Respiratory failure EXAM: PORTABLE CHEST 1 VIEW COMPARISON:  Yesterday FINDINGS: Right IJ line with tip at the upper cavoatrial junction. Hazy appearance of the lower chest primarily attributed to pleural fluid with atelectasis. Normal heart size. No visible  pneumothorax. IMPRESSION: Unchanged pleural effusions and atelectasis. Electronically Signed   By: Tiburcio Pea M.D.   On: 09/23/2022 07:08   DG CHEST PORT 1 VIEW  Result Date: 09/22/2022 CLINICAL DATA:  Infection of tunneled central venous catheter. EXAM: PORTABLE CHEST 1 VIEW COMPARISON:  None 424 FINDINGS: Endotracheal tube with tip measuring 3.1 cm above the carina. Enteric tube tip is off the field of view but below the left hemidiaphragm. Right central venous catheter with tip over the cavoatrial junction region. No pneumothorax. Cardiac enlargement. Perihilar and basilar infiltrates possibly edema or pneumonia. Small bilateral pleural effusions. Kyphoplasty changes at L2. Degenerative changes in the spine and shoulders. Calcification of the aorta. IMPRESSION: 1. Appliances appear in satisfactory position. 2. Cardiac enlargement with perihilar and basilar infiltrates, likely edema or pneumonia. 3. Small pleural effusions. Electronically Signed   By: Burman Nieves M.D.   On: 09/22/2022 20:08   ECHOCARDIOGRAM COMPLETE  Result Date: 09/22/2022    ECHOCARDIOGRAM REPORT   Patient Name:   Brenda [name] Date of Exam: 09/22/2022 Medical Rec #:  [id number]     Height:       64.0 in Accession #:    [id number]    Weight:       101.4 lb Date of Birth:  [birth year]    BSA:          1.466 m Patient Age:    82 years      BP:           129/74 mmHg Patient Gender: F             HR:           112 bpm. Exam Location:  Inpatient Procedure: 2D Echo, Cardiac Doppler, Color Doppler and Intracardiac            Opacification Agent Indications:    Cardiac arrest (HCC) [427.5.ICD-9-CM]  History:        Patient has prior history of Echocardiogram examinations, most                 recent 08/20/2019. Signs/Symptoms:Shortness of Breath and Dyspnea;  Risk Factors:Hypertension.  Sonographer:    Eulah Pont RDCS Referring Phys: Norton Blizzard  Sonographer Comments: Echo performed with patient supine and on artificial  respirator. IMPRESSIONS  1. Anterior/anteroseptal/inferoseptal/inferior akinesis from the base to the apex. No LV thrombus visualized. Left ventricular ejection fraction, by estimation, is 30 to 35%. The left ventricle has moderately decreased function. There is mild left ventricular hypertrophy. Indeterminate diastolic filling due to E-A fusion.  2. Right ventricular systolic function is mildly reduced. The right ventricular size is normal. There is mildly elevated pulmonary artery systolic pressure.  3. Appears to have RV collapse in early diastole. no inflows done. . Moderate pericardial effusion. The pericardial effusion is anterior to the right ventricle. Moderate pleural effusion.  4. The mitral valve is degenerative. Moderate to severe mitral valve regurgitation.  5. Tricuspid valve regurgitation is moderate.  6. Aortic valve regurgitation is mild.  7. The inferior vena cava is normal in size with <50% respiratory variability, suggesting right atrial pressure of 8 mmHg. Conclusion(s)/Recommendation(s): LAD territory infarct. Has RV collapse in early diastole, IVC not collapsing however she is ventilated. Has echo findings suggestive of early tamponade. Communicated with the primary team. FINDINGS  Left Ventricle: Anterior/anteroseptal/inferoseptal/inferior akinesis from the base to the apex. No LV thrombus visualized. Left ventricular ejection fraction, by estimation, is 30 to 35%. The left ventricle has moderately decreased function. Definity contrast agent was given IV to delineate the left ventricular endocardial borders. The left ventricular internal cavity size was normal in size. There is mild left ventricular hypertrophy. Indeterminate diastolic filling due to E-A fusion. Right Ventricle: The right ventricular size is normal. Right ventricular systolic function is mildly reduced. There is mildly elevated pulmonary artery systolic pressure. The tricuspid regurgitant velocity is 3.05 m/s, and with an  assumed right atrial pressure of 3 mmHg, the estimated right ventricular systolic pressure is 40.2 mmHg. Left Atrium: Left atrial size was normal in size. Right Atrium: Right atrial size was normal in size. Pericardium: Appears to have RV collapse in early diastole. no inflows done. A moderately sized pericardial effusion is present. The pericardial effusion is anterior to the right ventricle. Mitral Valve: The mitral valve is degenerative in appearance. There is moderate calcification of the mitral valve leaflet(s). Moderate to severe mitral valve regurgitation. Tricuspid Valve: Tricuspid valve regurgitation is moderate. Aortic Valve: Aortic valve regurgitation is mild. Aortic regurgitation PHT measures 350 msec. Pulmonic Valve: Pulmonic valve regurgitation is not visualized. Aorta: The aortic root is normal in size and structure. Venous: The inferior vena cava is normal in size with less than 50% respiratory variability, suggesting right atrial pressure of 8 mmHg. IAS/Shunts: No atrial level shunt detected by color flow Doppler. Additional Comments: There is a moderate pleural effusion.  LEFT VENTRICLE PLAX 2D LVIDd:         3.30 cm     Diastology LVIDs:         2.40 cm     LV e' medial:    6.53 cm/s LV PW:         0.80 cm     LV E/e' medial:  12.7 LV IVS:        1.20 cm     LV e' lateral:   9.45 cm/s LVOT diam:     1.70 cm     LV E/e' lateral: 8.8 LV SV:         22 LV SV Index:   15 LVOT Area:     2.27 cm  LV Volumes (  MOD) LV vol d, MOD A2C: 49.0 ml LV vol d, MOD A4C: 44.8 ml LV vol s, MOD A2C: 31.7 ml LV vol s, MOD A4C: 32.3 ml LV SV MOD A2C:     17.3 ml LV SV MOD A4C:     44.8 ml LV SV MOD BP:      15.5 ml RIGHT VENTRICLE RV S prime:     8.93 cm/s TAPSE (M-mode): 1.4 cm LEFT ATRIUM           Index        RIGHT ATRIUM           Index LA diam:      3.20 cm 2.18 cm/m   RA Area:     11.00 cm LA Vol (A2C): 38.7 ml 26.40 ml/m  RA Volume:   24.50 ml  16.71 ml/m LA Vol (A4C): 42.8 ml 29.20 ml/m  AORTIC VALVE LVOT  Vmax:   65.80 cm/s LVOT Vmean:  44.800 cm/s LVOT VTI:    0.098 m AI PHT:      350 msec  AORTA Ao Root diam: 2.90 cm Ao Asc diam:  2.90 cm MITRAL VALVE                  TRICUSPID VALVE MV Area (PHT): 5.62 cm       TR Peak grad:   37.2 mmHg MV Decel Time: 135 msec       TR Vmax:        305.00 cm/s MR Peak grad:    100.0 mmHg MR Mean grad:    68.0 mmHg    SHUNTS MR Vmax:         500.00 cm/s  Systemic VTI:  0.10 m MR Vmean:        393.0 cm/s   Systemic Diam: 1.70 cm MR PISA:         1.01 cm MR PISA Eff ROA: 8 mm MR PISA Radius:  0.40 cm MV E velocity: 82.70 cm/s MV A velocity: 44.60 cm/s MV E/A ratio:  1.85 Photographer signed by Carolan Clines Signature Date/Time: 09/22/2022/5:20:46 PM    Final    CT HEAD WO CONTRAST ( )  Result Date: 09/22/2022 CLINICAL DATA:  Provided history: Mental status change, unknown cause. Additional history provided: Cardiac arrest. EXAM: CT HEAD WITHOUT CONTRAST TECHNIQUE: Contiguous axial images were obtained from the base of the skull through the vertex without intravenous contrast. RADIATION DOSE REDUCTION: This exam was performed according to the departmental dose-optimization program which includes automated exposure control, adjustment of the mA and/or kV according to patient size and/or use of iterative reconstruction technique. COMPARISON:  Brain MRI 03/06/2021.  Head CT 09/10/2022. FINDINGS: Residual circulating intravascular contrast is noted. Brain: Moderate generalized cerebral atrophy.  Mild cerebellar atrophy. Redemonstrated redemonstrated chronic infarct within the left corona radiata, internal capsule and basal ganglia. Redemonstrated redemonstrated small chronic lacunar infarct within the right caudate nucleus. Background advanced patchy and ill-defined hypoattenuation within the cerebral white matter, nonspecific but compatible with chronic small vessel ischemic disease. There is no acute intracranial hemorrhage. No demarcated cortical infarct. No  extra-axial fluid collection. No evidence of an intracranial mass. No midline shift. Vascular: Atherosclerotic calcifications. Skull: No calvarial fracture or aggressive osseous lesion. Sinuses/Orbits: No mass or acute finding within the imaged orbits. Post-surgical appearance of the paranasal sinuses. Mild mucosal thickening within the right maxillary sinus. Small fluid level, and mild background mucosal thickening, within the left maxillary sinus. Mild mucosal thickening, and small-volume secretions, within  the right sphenoid sinus. Mild mucosal thickening within bilateral ethmoid air cells. Other: Redemonstrated chronic fracture deformities of the lateral wall of the right orbit, posterolateral wall the right maxillary sinus and right zygomatic arch. IMPRESSION: 1. No CT evidence of an acute intracranial abnormality. Please note, a brain MRI would have greater sensitivity for acute hypoxic/ischemic injury. 2. Parenchymal atrophy, chronic small vessel ischemic disease and chronic infarcts as described. 3. Paranasal sinus disease as outlined. 4. Redemonstrated chronic facial fracture deformities. Electronically Signed   By: Jackey Loge D.O.   On: 09/22/2022 14:43   DG Chest Port 1 View  Result Date: 09/22/2022 CLINICAL DATA:  placement of tube EXAM: PORTABLE CHEST 1 VIEW COMPARISON:  09/22/2022, 10 a.m. FINDINGS: There is diffusely increased vascular congestion, in very short timeframe, favoring congestive heart failure/pulmonary edema. There are bilateral pleural effusions, better seen on the CT scan chest performed just before this exam. Stable cardio-mediastinal silhouette. No acute osseous abnormalities. Upper lumbar vertebral kyphoplasty again seen. The soft tissues are within normal limits. Tube/lines: * Endotracheal tube tip is 3.5 cm from the carina. * An enteric tube extends below diaphragm, tip out of the view of the radiograph. However, the side hole overlies the left upper abdomen, within the  proximal stomach. *Multiple additional monitoring electrodes noted overlying the upper abdomen. IMPRESSION: *Interval development of congestive heart failure/pulmonary edema. *Tube/lines, as described above. Electronically Signed   By: Jules Schick M.D.   On: 09/22/2022 13:18   CT Angio Chest PE W and/or Wo Contrast  Result Date: 09/22/2022 CLINICAL DATA:  Elevated D-dimer. Shortness of breath. Evaluate for pulmonary embolism. EXAM: CT ANGIOGRAPHY CHEST WITH CONTRAST TECHNIQUE: Multidetector CT imaging of the chest was performed using the standard protocol during bolus administration of intravenous contrast. Multiplanar CT image reconstructions and MIPs were obtained to evaluate the vascular anatomy. RADIATION DOSE REDUCTION: This exam was performed according to the departmental dose-optimization program which includes automated exposure control, adjustment of the mA and/or kV according to patient size and/or use of iterative reconstruction technique. CONTRAST:  75mL OMNIPAQUE IOHEXOL 350 MG/ML SOLN COMPARISON:  09/13/2022; chest radiograph-09/22/2022 FINDINGS: Vascular Findings: There is adequate opacification of the pulmonary arterial system with the main pulmonary artery measuring 816 Hounsfield units. There are no discrete filling defects within the pulmonary arterial tree to suggest pulmonary embolism. Normal caliber of the main pulmonary artery. Borderline cardiomegaly. Coronary artery calcifications. Scattered atherosclerotic plaque within a normal caliber thoracic aorta. Conventional configuration of the aortic arch. The descending thoracic aorta is of normal caliber. Review of the MIP images confirms the above findings. ---------------------------------------------------------------------------------- Nonvascular Findings: Mediastinum/Lymph Nodes: No bulky mediastinal, hilar or axillary lymphadenopathy Lungs/Pleura: Compared to recent chest CT performed 09/13/2022, there has been development of small  to moderate-sized bilateral pleural effusions with associated dependent subsegmental atelectasis, most conspicuously involving the bilateral lower lobes. No associated air bronchograms. Redemonstrated apical predominant centrilobular emphysematous change. The central pulmonary airways appear widely patent. No discrete pulmonary nodules given parenchymal atelectasis/collapse. Upper abdomen: Limited early arterial phase evaluation of the upper abdomen demonstrates moderate-to-large sized hiatal hernia. Musculoskeletal: No acute or aggressive osseous abnormalities. Mild scoliotic curvature of the thoracolumbar spine. Sequela of previous cement augmentation of the L2 vertebral body. Redemonstrated mild (approximately 25%) compression deformity involving the superior endplate of the T3 vertebral body. Normal appearance of the thyroid gland. Regional soft tissues appear normal. IMPRESSION: 1. No evidence of pulmonary embolism. 2. Borderline cardiomegaly with interval development of small to moderate-sized grossly symmetric bilateral pleural effusions suggestive  of pulmonary edema. Further evaluation with cardiac echo could be performed as indicated. 3. Coronary artery calcifications. Aortic Atherosclerosis (ICD10-I70.0). 4.  Emphysema (ICD10-J43.9). 5. Moderate-to-large sized hiatal hernia. Electronically Signed   By: Simonne Come M.D.   On: 09/22/2022 12:24   DG Chest 2 View  Result Date: 09/22/2022 CLINICAL DATA:  Shortness of breath since yesterday and is hypoxic. Hypotensive EXAM: CHEST - 2 VIEW COMPARISON:  X-ray 09/13/2022 and CT scan FINDINGS: Stable cardiopericardial silhouette. Underinflation. New lung base opacities particularly on the left with some effusion. No pneumothorax. Chronic interstitial changes. Calcified aorta. Overlapping cardiac leads. Film is rotated to the right. Diffuse degenerative changes IMPRESSION: New small pleural effusions and adjacent opacities, left-greater-than-right. Electronically  Signed   By: Karen Kays M.D.   On: 09/22/2022 10:39     Medications:     Scheduled Medications:  aspirin  81 mg Per Tube Daily   Chlorhexidine Gluconate Cloth  6 each Topical Daily   heparin injection (subcutaneous)  5,000 Units Subcutaneous Q8H   insulin aspart  0-9 Units Subcutaneous Q4H   mouth rinse  15 mL Mouth Rinse 4 times per day   pantoprazole (PROTONIX) IV  40 mg Intravenous Daily   potassium chloride  20 mEq Oral Q4H   primidone  50 mg Oral Q8H    Infusions:  sodium chloride     amiodarone 30 mg/hr (09/23/22 0800)   ceFEPime (MAXIPIME) IV Stopped (09/23/22 0442)   dexmedetomidine (PRECEDEX) IV infusion     milrinone 0.125 mcg/kg/min (09/23/22 0800)   norepinephrine (LEVOPHED) Adult infusion 24 mcg/min (09/23/22 0800)   potassium chloride 10 mEq (09/23/22 0905)   [START ON 09/24/2022] vancomycin      PRN Medications: Place/Maintain arterial line **AND** sodium chloride, [START ON 09/24/2022] acetaminophen **OR** [START ON 09/24/2022] acetaminophen (TYLENOL) oral liquid 160 mg/5 mL **OR** [START ON 09/24/2022] acetaminophen, albuterol, [DISCONTINUED] busPIRone **OR** busPIRone, docusate, fentaNYL (SUBLIMAZE) injection, fentaNYL (SUBLIMAZE) injection, mouth rinse, mouth rinse, polyethylene glycol    Patient Profile   Brenda [name] is a 82 y.o. female with skin cancer, HTN, HLD, anxiety, tremor, chronic diarrhea, colon polyps, and migraines. AHF team to see post VT arrest.   Assessment/Plan  Cardiogenic shock post VT/VF cardiac arrest - suspected 2/2 electrolyte imbalances in setting of cardiomyopathy - Takotsubo? Anterior wall infarct? Myocarditis with recent covid? - Echo EF 20% with akinesis of dist 2/3 of LV - lactic acid 2.9>8.1>2.6 - support with NE and milrinone - trend co-ox and CVP. Co-ox 71%. CVP 6-10. Give 60 IV lasix. Repeat BMET around noon - Continue amiodarone gtt 30 mg/hr - L/RHC later today vs tomorrow. Pending respiratory status - not candidate for  mechanical support   2. Acute hypoxic respiratory failure - PCCM following - vanc/cefepime per primary. Afebrile.  - Self extubated yesterday. On CPAP this morning - Plan to wean to [place] if she tolerates    3. Electrolyte imbalances - suspected 2/2 chronic diarrhea - replete K, Mg, Ca - repeat BMET ordered   4. Recent COVID infection - on precautions  5. Atrial fibrillation - briefly noted yesterday during arrest. EKG 13 minutes later with SVT - suspect 2/2 electrolyte imbalances - no further a fib noted - will hold off on [facility] for now, if there is a reoccurrence will need AC  6. GOC - primary consulting palliative care to establish GOC - wife and husband not on the same page - previously DNR   Length of Stay: 1  Alen Bleacher, NP  09/23/2022, 9:08 AM  Advanced Heart Failure Team Pager [phone] (M-F; 7a - 5p)  Please contact [facility] for night-coverage after hours (5p -7a ) and weekends on [email or web address]  Agree with above.   Self-extubated. Now on bipap. Remains SOB. Denies CP.   Remains on NE/milrinone. Co-ox improved. CVP- 8-10  General:  Elderly critically ill appearing. On bipap dyspneic HEENT: normal + bipap Neck: supple. JVP to jaw Carotids 2+ bilat; no bruits. No lymphadenopathy or thryomegaly appreciated. Cor: PMI nondisplaced. Regular rate & rhythm. No rubs, gallops or murmurs. Lungs: coarse Abdomen: soft, nontender, nondistended. No hepatosplenomegaly. No bruits or masses. Good bowel sounds. Extremities: no cyanosis, clubbing, rash, 1+ edema Neuro: alert & orientedx3, cranial nerves grossly intact. moves all 4 extremities w/o difficulty. Affect pleasant  Remains very tenuous. Ideally will need R/L cath to further evaluate cause of LV dysfunction (LAD infarct vs Tako-tsubo). Will need respiratory status improvement before considering cath. Continue inotropes. Diurese. Continue amio. Palliative care now involved to help clarify GOC and scope of treatment desired.    CRITICAL CARE Performed by: Arvilla Meres  Total critical care time: 45 minutes  Critical care time was exclusive of separately billable procedures and treating other patients.  Critical care was necessary to treat or prevent imminent or life-threatening deterioration.  Critical care was time spent personally by me (independent of midlevel providers or residents) on the following activities: development of treatment plan with patient and/or surrogate as well as nursing, discussions with consultants, evaluation of patient's response to treatment, examination of patient, obtaining history from patient or surrogate, ordering and performing treatments and interventions, ordering and review of laboratory studies, ordering and review of radiographic studies, pulse oximetry and re-evaluation of patient's condition.  Arvilla Meres, MD  5:12 PM

## 2022-09-23 NOTE — Progress Notes (Signed)
eLink Physician-Brief Progress Note Patient Name: Brenda [name] DOB: Jun 02, 1940 MRN: [id number]   Date of Service  09/23/2022  HPI/Events of Note  82 y.o. female with skin cancer, HTN, HLD, anxiety, tremor, chronic diarrhea, colon polyps, and migraines came in in with VF cardiac arrest   Extubated yesterday, passed bedside swallow.  Remains on 6 L of oxygen with saturations borderline upper 80s.    eICU Interventions  Advance diet to clear liquid diet.  Okay to give oral medications.  Monitor for aspiration     Intervention Category Minor Interventions: Routine modifications to care plan (e.g. PRN medications for pain, fever)  Rossanna Spitzley 09/23/2022, 10:13 PM

## 2022-09-23 NOTE — Progress Notes (Signed)
[facility] ADULT ICU REPLACEMENT PROTOCOL   The patient does apply for the [facility] Adult ICU Electrolyte Replacment Protocol based on the criteria listed below:   1.Exclusion criteria: TCTS, ECMO, Dialysis, and Myasthenia Gravis patients 2. Is GFR >/= 30 ml/min? Yes.    Patient's GFR today is >60 3. Is SCr </= 2? Yes.   Patient's SCr is 0.93 mg/dL 4. Did SCr increase >/= 0.5 in 24 hours? No. 5.Pt's weight >40kg  Yes.   6. Abnormal electrolyte(s): K 3.3, Mg 1.9  7. Electrolytes replaced per protocol 8.  Call MD STAT for K+ </= 2.5, Phos </= 1, or Mag </= 1 Physician:  A. Florestine Avers R Shenae Bonanno 09/23/2022 5:46 AM

## 2022-09-23 NOTE — Progress Notes (Signed)
ANTICOAGULATION CONSULT NOTE - Initial Consult  Pharmacy Consult for heparin Indication: chest pain/ACS  Allergies  Allergen Reactions   Macrodantin [Nitrofurantoin Macrocrystal] Shortness Of Breath, Itching, Nausea And Vomiting, Swelling, Rash and Other (See Comments)    Tongue swelling   Topamax [Topiramate] Other (See Comments)    Loss of taste, fatigue!!!!!!   Diclofenac Other (See Comments)    Stomach burning   Tramadol Other (See Comments)    Stomach burning   Rice Nausea And Vomiting   Rosemary Oil Nausea And Vomiting   Codeine Nausea And Vomiting    Patient Measurements: Height: 5\' 4"  (162.6 cm) Weight: 52.2 kg (115 lb 1.3 oz) IBW/kg (Calculated) : 54.7 Heparin Dosing Weight: 52.2 kg   Vital Signs: Temp: 99 F (37.2 C) (09/05 1345) Temp Source: Bladder (09/05 0800) BP: 105/49 (09/05 1315) Pulse Rate: 84 (09/05 1345)  Labs: Recent Labs    09/22/22 0927 09/22/22 1213 09/22/22 1305 09/22/22 1638 09/22/22 1700 09/22/22 2015 09/23/22 0023 09/23/22 0414 09/23/22 0445 09/23/22 1258  HGB 10.3*   < >  --    < >  --   --  11.2* 10.1* 10.2*  --   HCT 31.0*   < >  --    < >  --   --  33.0* 29.8* 30.0*  --   PLT 218  --   --   --   --   --   --  228  --   --   LABPROT 21.0*  --   --   --   --   --   --  23.4*  --   --   INR 1.8*  --   --   --   --   --   --  2.1*  --   --   CREATININE 0.90  --   --   --  1.03*  --   --  0.93  --  0.94  TROPONINIHS  --   --  1,042*  --  2,130* 2,768*  --   --   --   --    < > = values in this interval not displayed.    Estimated Creatinine Clearance: 39.3 mL/min (by C-G formula based on SCr of 0.94 mg/dL).   Medical History: Past Medical History:  Diagnosis Date   Anxiety    Chronic diarrhea    Coarse tremors    Colon polyp    DDD (degenerative disc disease), cervical    Hiatal hernia    History of skin cancer    Hypercholesteremia    Hypertension    Migraines    Osteoporosis    Shortness of breath dyspnea    with  exertion   Tremor    Weight loss     Medications:  Scheduled:   aspirin  81 mg Per Tube Daily   Chlorhexidine Gluconate Cloth  6 each Topical Daily   heparin injection (subcutaneous)  5,000 Units Subcutaneous Q8H   insulin aspart  0-9 Units Subcutaneous Q4H   mouth rinse  15 mL Mouth Rinse 4 times per day   pantoprazole (PROTONIX) IV  40 mg Intravenous Daily   primidone  50 mg Oral Q8H    Assessment: 81 yof presented after VF cardiac arrest - had been recently hospitalized for COVID. Now found to have reduced EF on ECHO. Trop 3875>6433.   Hgb 10.2, plt 228. INR elevated at 2.1 (not on DOAC/warfarin prior) so likely related to shock liver. No s/sx  of bleeding.  Goal of Therapy:  Heparin level 0.3-0.7 units/ml Monitor platelets by anticoagulation protocol: Yes   Plan:  Start heparin infusion at 600 units/hr Order heparin level in 8 hours Monitor daily HL, CBC, and for s/sx of bleeding  Thank you for allowing pharmacy to participate in this patient's care,  Sherron Monday, PharmD, BCCCP Clinical Pharmacist  Phone: [phone] 09/23/2022 2:33 PM  Please check AMION for all [facility] phone numbers After 10:00 PM, call Main Pharmacy [phone]

## 2022-09-23 NOTE — Progress Notes (Signed)
Nutrition Brief Note  Chart reviewed. Pt admitted from ALF after arrest. Pt self-extubated 9/4. Palliative care consulted. Pt desires DNR/DNI and no artifical nutrition. Palliative care working on POC to honor patient's wishes.  No nutrition interventions planned at this time.  Please re-consult as needed.   Cammy Copa., RD, LDN, CNSC See AMiON for contact information

## 2022-09-24 ENCOUNTER — Inpatient Hospital Stay (HOSPITAL_COMMUNITY): Payer: Medicare PPO

## 2022-09-24 DIAGNOSIS — Z7189 Other specified counseling: Secondary | ICD-10-CM | POA: Diagnosis not present

## 2022-09-24 DIAGNOSIS — J90 Pleural effusion, not elsewhere classified: Secondary | ICD-10-CM

## 2022-09-24 DIAGNOSIS — I469 Cardiac arrest, cause unspecified: Secondary | ICD-10-CM | POA: Diagnosis not present

## 2022-09-24 DIAGNOSIS — Z515 Encounter for palliative care: Secondary | ICD-10-CM

## 2022-09-24 DIAGNOSIS — J810 Acute pulmonary edema: Secondary | ICD-10-CM

## 2022-09-24 DIAGNOSIS — Z66 Do not resuscitate: Secondary | ICD-10-CM

## 2022-09-24 DIAGNOSIS — I4901 Ventricular fibrillation: Secondary | ICD-10-CM | POA: Diagnosis not present

## 2022-09-24 DIAGNOSIS — R570 Cardiogenic shock: Secondary | ICD-10-CM

## 2022-09-24 DIAGNOSIS — U071 COVID-19: Secondary | ICD-10-CM | POA: Diagnosis not present

## 2022-09-24 LAB — BASIC METABOLIC PANEL
Anion gap: 11 (ref 5–15)
BUN: <5 mg/dL — ABNORMAL LOW (ref 8–23)
CO2: 17 mmol/L — ABNORMAL LOW (ref 22–32)
Calcium: 6.8 mg/dL — ABNORMAL LOW (ref 8.9–10.3)
Chloride: 100 mmol/L (ref 98–111)
Creatinine, Ser: 0.95 mg/dL (ref 0.44–1.00)
GFR, Estimated: >60 mL/min (ref >60)
Glucose, Bld: 124 mg/dL — ABNORMAL HIGH (ref 70–99)
Potassium: 3.2 mmol/L — ABNORMAL LOW (ref 3.5–5.1)
Sodium: 128 mmol/L — ABNORMAL LOW (ref 135–145)

## 2022-09-24 LAB — HEPARIN LEVEL (UNFRACTIONATED)
Heparin Unfractionated: 0.38 [IU]/mL (ref 0.30–0.70)
Heparin Unfractionated: 0.45 [IU]/mL (ref 0.30–0.70)

## 2022-09-24 LAB — COOXEMETRY PANEL
Carboxyhemoglobin: 1.5 % (ref 0.5–1.5)
Methemoglobin: <0.7 % (ref 0.0–1.5)
O2 Saturation: 60.2 %
Total hemoglobin: 10.1 g/dL — ABNORMAL LOW (ref 12.0–16.0)

## 2022-09-24 LAB — CBC
HCT: 29.8 % — ABNORMAL LOW (ref 36.0–46.0)
Hemoglobin: 10.1 g/dL — ABNORMAL LOW (ref 12.0–15.0)
MCH: 33.3 pg (ref 26.0–34.0)
MCHC: 33.9 g/dL (ref 30.0–36.0)
MCV: 98.3 fL (ref 80.0–100.0)
Platelets: 246 10*3/uL (ref 150–400)
RBC: 3.03 MIL/uL — ABNORMAL LOW (ref 3.87–5.11)
RDW: 14.4 % (ref 11.5–15.5)
WBC: 11.4 10*3/uL — ABNORMAL HIGH (ref 4.0–10.5)
nRBC: 0 % (ref 0.0–0.2)

## 2022-09-24 LAB — MAGNESIUM: Magnesium: 1.7 mg/dL (ref 1.7–2.4)

## 2022-09-24 LAB — GLUCOSE, CAPILLARY: Glucose-Capillary: 131 mg/dL — ABNORMAL HIGH (ref 70–99)

## 2022-09-24 MED ORDER — ACETAMINOPHEN 325 MG PO TABS: 650.0000 mg | ORAL_TABLET | Freq: Four times a day (QID) | ORAL | Status: DC | PRN

## 2022-09-24 MED ORDER — HYDROMORPHONE HCL 1 MG/ML IJ SOLN
1.0000 mg | INTRAMUSCULAR | Status: DC | PRN
  Administered 2022-09-24 (×2): 1 mg via INTRAVENOUS
  Filled 2022-09-24 (×3): qty 1

## 2022-09-24 MED ORDER — ASPIRIN 81 MG PO CHEW: 81.0000 mg | CHEWABLE_TABLET | Freq: Every day | ORAL | Status: DC

## 2022-09-24 MED ORDER — POLYVINYL ALCOHOL 1.4 % OP SOLN: 1.0000 [drp] | Freq: Four times a day (QID) | OPHTHALMIC | Status: DC | PRN

## 2022-09-24 MED ORDER — MIDAZOLAM HCL 2 MG/2ML IJ SOLN: 2.0000 mg | INTRAMUSCULAR | Status: DC | PRN

## 2022-09-24 MED ORDER — POLYETHYLENE GLYCOL 3350 17 G PO PACK: 17.0000 g | PACK | Freq: Every day | ORAL | Status: DC | PRN

## 2022-09-24 MED ORDER — LORAZEPAM 2 MG/ML PO CONC: 1.0000 mg | ORAL | Status: DC | PRN

## 2022-09-24 MED ORDER — ONDANSETRON HCL 4 MG/2ML IJ SOLN: 4.0000 mg | Freq: Four times a day (QID) | INTRAMUSCULAR | Status: DC | PRN

## 2022-09-24 MED ORDER — MORPHINE 100MG IN NS 100ML (1MG/ML) PREMIX INFUSION
0.0000 mg/h | INTRAVENOUS | Status: DC
  Administered 2022-09-24 (×2): 5 mg/h via INTRAVENOUS

## 2022-09-24 MED ORDER — BIOTENE DRY MOUTH MT LIQD: 15.0000 mL | OROMUCOSAL | Status: DC | PRN

## 2022-09-24 MED ORDER — ONDANSETRON 4 MG PO TBDP
4.0000 mg | ORAL_TABLET | Freq: Four times a day (QID) | ORAL | Status: DC | PRN
  Filled 2022-09-24: qty 1

## 2022-09-24 MED ORDER — LORAZEPAM 1 MG PO TABS: 1.0000 mg | ORAL_TABLET | ORAL | Status: DC | PRN

## 2022-09-24 MED ORDER — GLYCOPYRROLATE 1 MG PO TABS
1.0000 mg | ORAL_TABLET | ORAL | Status: DC | PRN
  Filled 2022-09-24: qty 1

## 2022-09-24 MED ORDER — SODIUM CHLORIDE 0.9 % IV SOLN: INTRAVENOUS | Status: DC

## 2022-09-24 MED ORDER — LORAZEPAM 2 MG/ML IJ SOLN: 1.0000 mg | INTRAMUSCULAR | Status: DC | PRN

## 2022-09-24 MED ORDER — GLYCOPYRROLATE 0.2 MG/ML IJ SOLN
0.2000 mg | INTRAMUSCULAR | Status: DC | PRN
  Administered 2022-09-24: 0.2 mg via INTRAVENOUS
  Filled 2022-09-24 (×2): qty 1

## 2022-09-24 MED ORDER — SCOPOLAMINE 1 MG/3DAYS TD PT72
1.0000 | MEDICATED_PATCH | TRANSDERMAL | Status: DC
  Administered 2022-09-24: 1.5 mg via TRANSDERMAL
  Filled 2022-09-24: qty 1

## 2022-09-24 MED ORDER — HALOPERIDOL LACTATE 5 MG/ML IJ SOLN: 2.5000 mg | INTRAMUSCULAR | Status: DC | PRN

## 2022-09-24 MED ORDER — MORPHINE 100MG IN NS 100ML (1MG/ML) PREMIX INFUSION
1.0000 mg/h | INTRAVENOUS | Status: DC
  Filled 2022-09-24: qty 100

## 2022-09-24 MED ORDER — GLYCOPYRROLATE 0.2 MG/ML IJ SOLN: 0.2000 mg | INTRAMUSCULAR | Status: DC | PRN

## 2022-09-24 MED ORDER — DIPHENHYDRAMINE HCL 50 MG/ML IJ SOLN
12.5000 mg | Freq: Four times a day (QID) | INTRAMUSCULAR | Status: DC | PRN
  Administered 2022-09-24: 12.5 mg via INTRAVENOUS
  Filled 2022-09-24: qty 1

## 2022-09-24 MED ORDER — MORPHINE BOLUS VIA INFUSION
5.0000 mg | INTRAVENOUS | Status: DC | PRN
  Administered 2022-09-24 (×2): 5 mg via INTRAVENOUS

## 2022-09-24 MED ORDER — DIPHENHYDRAMINE HCL 50 MG/ML IJ SOLN: 25.0000 mg | INTRAMUSCULAR | Status: DC | PRN

## 2022-09-24 MED ORDER — DOCUSATE SODIUM 50 MG/5ML PO LIQD: 100.0000 mg | Freq: Two times a day (BID) | ORAL | Status: DC | PRN

## 2022-09-24 MED ORDER — POTASSIUM CHLORIDE 10 MEQ/50ML IV SOLN
10.0000 meq | INTRAVENOUS | Status: AC
  Administered 2022-09-24 (×6): 10 meq via INTRAVENOUS
  Filled 2022-09-24 (×6): qty 50

## 2022-09-24 MED ORDER — ACETAMINOPHEN 650 MG RE SUPP: 650.0000 mg | Freq: Four times a day (QID) | RECTAL | Status: DC | PRN

## 2022-09-24 NOTE — Progress Notes (Signed)
eLink Physician-Brief Progress Note Patient Name: CAMREN WAHLERT DOB: [birth year] MRN: [id number]   Date of Service  09/24/2022  HPI/Events of Note  82 y.o. female with skin cancer, HTN, HLD, anxiety, tremor, chronic diarrhea, colon polyps, and migraines came in in with VF cardiac arrest.  Already in comfort measures.  Unfortunately still having copious secretions despite glycopyrrolate.  eICU Interventions  Add on scopolamine.     Intervention Category Minor Interventions: Routine modifications to care plan (e.g. PRN medications for pain, fever)  Chester Romero 09/24/2022, 10:07 PM

## 2022-09-24 NOTE — Progress Notes (Signed)
ANTICOAGULATION CONSULT NOTE   Pharmacy Consult for heparin Indication: chest pain/ACS  Allergies  Allergen Reactions   Macrodantin [Nitrofurantoin Macrocrystal] Shortness Of Breath, Itching, Nausea And Vomiting, Swelling, Rash and Other (See Comments)    Tongue swelling   Topamax [Topiramate] Other (See Comments)    Loss of taste, fatigue!!!!!!   Diclofenac Other (See Comments)    Stomach burning   Tramadol Other (See Comments)    Stomach burning   Rice Nausea And Vomiting   Rosemary Oil Nausea And Vomiting   Codeine Nausea And Vomiting    Patient Measurements: Height: 5\' 4"  (162.6 cm) Weight: 52.2 kg (115 lb 1.3 oz) IBW/kg (Calculated) : 54.7 Heparin Dosing Weight: 52.2 kg   Vital Signs: Temp: 99.1 F (37.3 C) (09/06 0000) Temp Source: Bladder (09/06 0000) BP: 104/45 (09/06 0000) Pulse Rate: 81 (09/06 0000)  Labs: Recent Labs    09/24/2022 0927 10/06/2022 1213 09/27/2022 2015 09/23/22 0023 09/23/22 0414 09/23/22 0445 09/23/22 1258 09/23/22 1556 09/23/22 1850 09/23/22 2300  HGB 10.3*   < >  --  11.2* 10.1* 10.2*  --   --   --   --   HCT 31.0*   < >  --  33.0* 29.8* 30.0*  --   --   --   --   PLT 218  --   --   --  228  --   --   --   --   --   LABPROT 21.0*  --   --   --  23.4*  --   --   --   --   --   INR 1.8*  --   --   --  2.1*  --   --   --   --   --   HEPARINUNFRC  --   --   --   --   --   --   --   --   --  0.38  CREATININE 0.90   < >  --   --  0.93  --  0.94  --  0.94  --   TROPONINIHS  --    < > 2,768*  --   --   --   --  1,409* 1,193*  --    < > = values in this interval not displayed.    Estimated Creatinine Clearance: 39.3 mL/min (by C-G formula based on SCr of 0.94 mg/dL).   Medical History: Past Medical History:  Diagnosis Date   Anxiety    Chronic diarrhea    Coarse tremors    Colon polyp    DDD (degenerative disc disease), cervical    Hiatal hernia    History of skin cancer    Hypercholesteremia    Hypertension    Migraines     Osteoporosis    Shortness of breath dyspnea    with exertion   Tremor    Weight loss     Medications:  Scheduled:   aspirin  81 mg Per Tube Daily   Chlorhexidine Gluconate Cloth  6 each Topical Daily   insulin aspart  0-9 Units Subcutaneous Q4H   pantoprazole (PROTONIX) IV  40 mg Intravenous Daily   primidone  50 mg Oral Q8H    Assessment: 81 yof presented after VF cardiac arrest - had been recently hospitalized for COVID. Now found to have reduced EF on ECHO. Trop 1610>9604.   Hgb 10.2, plt 228. INR elevated at 2.1 (not on DOAC/warfarin prior) so likely related  to shock liver. No s/sx of bleeding.  9/6 AM update:  Heparin level therapeutic   Goal of Therapy:  Heparin level 0.3-0.7 units/ml Monitor platelets by anticoagulation protocol: Yes   Plan:  Cont heparin infusion at 600 units/hr Heparin level with AM labs  Abran Duke, PharmD, BCPS Clinical Pharmacist Phone: [phone]

## 2022-09-24 NOTE — Progress Notes (Signed)
   Patient now switched to full comfort care.   AHF team will sign off.   Arvilla Meres, MD  9:13 AM

## 2022-09-24 NOTE — Progress Notes (Signed)
Daily Progress Note   Patient Name: Brenda [name]       Date: 09/24/2022 DOB: 13-Aug-1940  Age: 82 y.o. MRN#: [id number] Attending Physician: Steffanie Dunn, DO Primary Care Physician: Asencion Gowda.August Saucer, MD Admit Date: 09/23/2022  Reason for Consultation/Follow-up: Establishing goals of care  Subjective: AM: Medical records reviewed including progress notes, labs, imaging. Patient assessed at the bedside. She reports 8/10 pain unrelieved by PRN morphine. Her support person Brenda [name] is present visiting while husband and other family stepped out. Discussed with RN and CCM NP.  Patient confirms wishes for comfort focused care and that she does not want the cath procedure. The family plans to pick up her dog between 10-12 and give him time to adjust prior to a visit, as they have not seen in other in 3 weeks and he will likely be excitable. We discussed option to discontinue labs, liberalize use of pain medications, transition to modified comfort care until after her visit with her dog and then discontinue pressor support at that time. Brenda [name] is agreeable.   Morphine 2mg  IV has only improved pain from 8/10 to 7/10. Patient is open to trying dilaudid moving forward. Encouraged her to continue conversation with staff regarding pain control and preferred frequency of administration.  PM: Returned to the bedside.  Patient states that as needed Dilaudid IV is working very well for her.  She is enjoying her visit with her dog.  Patient and family do not have a set preference for timeline to proceed with discontinuation of pressors.  She is still waiting for her nephew to visit, which may not be until later this evening.  Discussed with RN and added Benadryl for patient's itching.  Questions and concerns addressed.  PMT will continue to support holistically.   Length of Stay: 2   Physical Exam Vitals and nursing note reviewed.  Constitutional:      General: She is not in acute distress.    Appearance: She is ill-appearing.  Cardiovascular:     Rate and Rhythm: Normal rate.  Pulmonary:     Effort: Pulmonary effort is normal.  Skin:    General: Skin is warm and dry.  Neurological:     Mental Status: She is alert and oriented to person, place, and time.  Psychiatric:  Mood and Affect: Mood normal.        Behavior: Behavior normal.            Vital Signs: BP (!) 109/51   Pulse 79   Temp 98.4 F (36.9 C)   Resp 20   Ht 5\' 4"  (1.626 m)   Wt 52.4 kg   SpO2 (!) 88%   BMI 19.83 kg/m  SpO2: SpO2: (!) 88 % O2 Device: O2 Device: High Flow Nasal Cannula O2 Flow Rate: O2 Flow Rate (L/min): 6 L/min      Palliative Assessment/Data: 20%   Palliative Care Assessment & Plan   Patient Profile: 82 y.o. female  with past medical history of HTN, HLD, anxiety, chronic diarrhea, tremor, skin cancer, migraines, osteoporosis admitted on 2022/09/24 with dyspnea x 1 day and hypotension.    Patient has been hospitalized 3 times in the past 6 months for COVID, AKI/electrolyte disturbances, abdominal pain. She arrived to the ED and during work up decompensated to vfib arrest. ROSC obtained and shortly after, pt suffered second PEA arrest with ROSC and intubated. Post arrest CXR with development of pulmonary edema. Requiring norepinephrine for refractory hypotension/cardiogenic shock. Patient now with poor prognosis and acute hypoxic respiratory failure, acute HFrEF, afib with RVR, failure to thrive. PMT has been consulted to assist with goals of care conversation.  Assessment: Goals of care conversation  Acute HFrEF cardiogenic shock  aFib with RVR Acute respiratory failure with hypoxia secondary to acute pulmonary edema Severe malnutrition Failure to thrive End of life  care  Recommendations/Plan: Continue DNR/DNI Transition to modified comfort care (continue pressors until after her dog visits, nephew arrival TBD as well)  Dilaudid PRN for pain/air hunger/comfort Robinul PRN for excessive secretions Ativan PRN for agitation/anxiety Zofran PRN for nausea Benadryl PRN for itching Liquifilm tears PRN for dry eyes May have comfort feeding Comfort cart for family Unrestricted visitations in the setting of EOL (per policy) Oxygen PRN 2L or less for comfort. No escalation.   Psychosocial and emotional support provided PMT will continue to follow and support   Prognosis: Poor prognosis given several acute complications and several chronic comorbidities with progressive decline   Discharge Planning: Anticipated Hospital Death  Care plan was discussed with patient, patient's family, RN, NP   MDM high         Jameek Bruntz Jeni Salles, PA-C  Palliative Medicine Team Team phone # [phone]  Thank you for allowing the Palliative Medicine Team to assist in the care of this patient. Please utilize secure chat with additional questions, if there is no response within 30 minutes please call the above phone number.  Palliative Medicine Team providers are available by phone from 7am to 7pm daily and can be reached through the team cell phone.  Should this patient require assistance outside of these hours, please call the patient's attending physician.

## 2022-09-24 NOTE — Plan of Care (Signed)
  Problem: Coping: Goal: Psychosocial and spiritual needs will be supported Outcome: Progressing   Problem: Respiratory: Goal: Will maintain a patent airway Outcome: Progressing   Problem: Role Relationship: Goal: Method of communication will improve Outcome: Progressing   Problem: Clinical Measurements: Goal: Ability to maintain clinical measurements within normal limits will improve Outcome: Progressing Goal: Will remain free from infection Outcome: Progressing Goal: Respiratory complications will improve Outcome: Progressing   Problem: Coping: Goal: Level of anxiety will decrease Outcome: Progressing

## 2022-09-24 NOTE — Progress Notes (Signed)
NAME:  Brenda [name], MRN:  [id number], DOB:  [birth year], LOS: 2 ADMISSION DATE:  10/08/2022, CONSULTATION DATE:  10/17/2022 REFERRING MD:  Dr. Hyacinth Meeker, CHIEF COMPLAINT:  cardiac arrest   History of Present Illness:   81yoF with PMH of HTN, HLD, anxiety, chronic diarrhea, and tremor presenting from [facility] assisted living for SOB for reported one day, found to be hypoxic and hypotensive with EMS treated with NS bolus.  Patient recently hospitalized in June for AKI, electrolyte derangements, wt loss, and abdominal pain and again 8/26-8/30 with worsening N/V/D after recent COVID dx (? 8/22) with AKI, multiple electrolyte derangements discharged to rehab.    On arrival to ER, pt denied complaints, was alert but slow to answer questions, noted to be afebrile, tachypneic, tachycardic, and normotensive.  CXR showed L> R bibasilar opacities and new small pleural effusion.  Labs with K 3.1, corrected Ca 7.5, bicarb 13, lactic 2.9, WBC 18.4, INR 1.8, treated with calcium gluconate and additional NS bolus for soft BP, improved after bolus. Was being rule out for possible PE given positive D-dimer and hypoxia.  On return from CT, patient developed rapid narrow complex rate then decompensated to vfib arrest.  ROSC obtained after CPR, 2 defibrillations and epi.  Amiodarone bolus and magnesium given.  Shortly after, pt suffered second PEA arrest with ROSC after one round and intubated after RSI.  Post arrest CXR with development of pulmonary edema, satisfactory tube placement.  Requiring norepinephrine for refractory hypotension. PCCM consulted for admit.   Husband at bedside but given he is in AIL and she is in rehab part, unaware of any acute recent events overnight.   Pertinent  Medical History  HTN, HLD, anxiety/ depression, IBS/ chronic diarrhea, GERD, hiatal hernia s/p Nissen fundoplication 2017, tremor, chronic vertebral fractures  Significant Hospital Events: Including procedures, antibiotic start  and stop dates in addition to other pertinent events   9/4 Admit SOB/ cardiac arrest  9/5 self extubated overnight on bipap 9/6 decision to pursue comfort care   Interim History / Subjective:   Remains on pressors this morning  Vomiting some   Objective   Blood pressure (!) 109/51, pulse 79, temperature 98.4 F (36.9 C), resp. rate 20, height 5\' 4"  (1.626 m), weight 52.4 kg, SpO2 (!) 88%. CVP:  [0 mmHg-10 mmHg] 8 mmHg      Intake/Output Summary (Last 24 hours) at 09/24/2022 1022 Last data filed at 09/24/2022 0600 Gross per 24 hour  Intake 1200.31 ml  Output 2280 ml  Net -1079.69 ml   Filed Weights   09/23/22 0445 09/24/22 0500  Weight: 52.2 kg 52.4 kg    Examination: General:  elderly frail and ill appearing F NAD  HEENT: NCAT pink mm  Neuro: AAOx4  CV: cap refill is sluggish.  PULM:  Shallow respirations, symmetrical chest expansion  GI: soft round  Extremities: dependent edema  Skin: pale, c/d, scattered ecchymosis   Resolved Hospital Problem list   Acute encephalopathy   Assessment & Plan:    GOC // encounter for palliative care  DNR  Cardiac arrests -- torsades then PEA  Shock -- cardiogenic +/- septic shock  Acute HFrEF Possible anterior MI  Afib RVR Acute hypoxic respiratory failure -- HCAP vs aspiration PNA Bilateral pleural effusion Pulmonary edema  Recent COVID infection  Anemia  Coagulopathy Chronic diarrhea NAGMA  Hypervolemic hyponatremia Hypokalemia  Hypermagnesemia  Hypocalcemia  Hx HTN Hx HLD  GERD  Hx Anxiety, depression FTT in adult  Protein calorie malnutrition  Physical deconditioning  P: -see IPAL note -in accordance with pt wishes, we are shifting focus of medical tx to comfort focussed care over the course of the next day or so -we are working to facilitate a visit from pts dog which may be 9/6 vs 9/7 -- we will cont pressors until this can happen -no further labs, imaging etc. De-escalating meds not aimed at comfort -would  keep CVC for now, dc EJ, art line.  -with pressor req, very likely to have in-hospital death.    Best Practice (right click and "Reselect all SmartList Selections" daily)   Diet/type: NPO DVT prophylaxis: not indicated GI prophylaxis: PPI Lines: Central line, Arterial Line, and No longer needed.  Order written to d/c  Foley:  Yes, and it is still needed Code Status:  DNR Last date of multidisciplinary goals of care discussion 9/6   Labs   CBC: Recent Labs  Lab 09/24/2022 0927 09/23/2022 1213 09/28/2022 1638 09/23/22 0023 09/23/22 0414 09/23/22 0445 09/24/22 0439  WBC 18.4*  --   --   --  13.3*  --  11.4*  NEUTROABS 17.4*  --   --   --   --   --   --   HGB 10.3*   < > 10.9* 11.2* 10.1* 10.2* 10.1*  HCT 31.0*   < > 32.0* 33.0* 29.8* 30.0* 29.8*  MCV 103.0*  --   --   --  96.1  --  98.3  PLT 218  --   --   --  228  --  246   < > = values in this interval not displayed.    Basic Metabolic Panel: Recent Labs  Lab 09/30/2022 1305 10/18/2022 1638 09/19/2022 1700 09/23/22 0023 09/23/22 0414 09/23/22 0445 09/23/22 1258 09/23/22 1850 09/24/22 0439  NA  --    < > 131*   < > 129* 132* 130* 132* 128*  K  --    < > 4.1   < > 3.3* 3.5 4.2 3.8 3.2*  CL  --   --  100  --  105  --  104 102 100  CO2  --   --  15*  --  14*  --  15* 17* 17*  GLUCOSE  --   --  278*  --  145*  --  112* 120* 124*  BUN  --   --  5*  --  6*  --  5* 6* <5*  CREATININE  --   --  1.03*  --  0.93  --  0.94 0.94 0.95  CALCIUM  --   --  6.7*  --  7.2*  --  7.2* 7.1* 6.8*  MG  --   --  2.5*  --  1.9  --  2.5* 2.1 1.7  PHOS 2.6  --   --   --   --   --   --   --   --    < > = values in this interval not displayed.   GFR: Estimated Creatinine Clearance: 39.1 mL/min (by C-G formula based on SCr of 0.95 mg/dL). Recent Labs  Lab 09/27/2022 0927 10/03/2022 0933 10/15/2022 1212 10/04/2022 1305 10/04/2022 1700 09/23/22 0414 09/24/22 0439  PROCALCITON  --   --   --  8.13  --  25.84  --   WBC 18.4*  --   --   --   --  13.3*  11.4*  LATICACIDVEN  --  2.9* 8.1*  --  2.6*  --   --  NAME:  Brenda [name], MRN:  [id number], DOB:  [birth year], LOS: 2 ADMISSION DATE:  10/08/2022, CONSULTATION DATE:  10/17/2022 REFERRING MD:  Dr. Hyacinth Meeker, CHIEF COMPLAINT:  cardiac arrest   History of Present Illness:   81yoF with PMH of HTN, HLD, anxiety, chronic diarrhea, and tremor presenting from [facility] assisted living for SOB for reported one day, found to be hypoxic and hypotensive with EMS treated with NS bolus.  Patient recently hospitalized in June for AKI, electrolyte derangements, wt loss, and abdominal pain and again 8/26-8/30 with worsening N/V/D after recent COVID dx (? 8/22) with AKI, multiple electrolyte derangements discharged to rehab.    On arrival to ER, pt denied complaints, was alert but slow to answer questions, noted to be afebrile, tachypneic, tachycardic, and normotensive.  CXR showed L> R bibasilar opacities and new small pleural effusion.  Labs with K 3.1, corrected Ca 7.5, bicarb 13, lactic 2.9, WBC 18.4, INR 1.8, treated with calcium gluconate and additional NS bolus for soft BP, improved after bolus. Was being rule out for possible PE given positive D-dimer and hypoxia.  On return from CT, patient developed rapid narrow complex rate then decompensated to vfib arrest.  ROSC obtained after CPR, 2 defibrillations and epi.  Amiodarone bolus and magnesium given.  Shortly after, pt suffered second PEA arrest with ROSC after one round and intubated after RSI.  Post arrest CXR with development of pulmonary edema, satisfactory tube placement.  Requiring norepinephrine for refractory hypotension. PCCM consulted for admit.   Husband at bedside but given he is in AIL and she is in rehab part, unaware of any acute recent events overnight.   Pertinent  Medical History  HTN, HLD, anxiety/ depression, IBS/ chronic diarrhea, GERD, hiatal hernia s/p Nissen fundoplication 2017, tremor, chronic vertebral fractures  Significant Hospital Events: Including procedures, antibiotic start  and stop dates in addition to other pertinent events   9/4 Admit SOB/ cardiac arrest  9/5 self extubated overnight on bipap 9/6 decision to pursue comfort care   Interim History / Subjective:   Remains on pressors this morning  Vomiting some   Objective   Blood pressure (!) 109/51, pulse 79, temperature 98.4 F (36.9 C), resp. rate 20, height 5\' 4"  (1.626 m), weight 52.4 kg, SpO2 (!) 88%. CVP:  [0 mmHg-10 mmHg] 8 mmHg      Intake/Output Summary (Last 24 hours) at 09/24/2022 1022 Last data filed at 09/24/2022 0600 Gross per 24 hour  Intake 1200.31 ml  Output 2280 ml  Net -1079.69 ml   Filed Weights   09/23/22 0445 09/24/22 0500  Weight: 52.2 kg 52.4 kg    Examination: General:  elderly frail and ill appearing F NAD  HEENT: NCAT pink mm  Neuro: AAOx4  CV: cap refill is sluggish.  PULM:  Shallow respirations, symmetrical chest expansion  GI: soft round  Extremities: dependent edema  Skin: pale, c/d, scattered ecchymosis   Resolved Hospital Problem list   Acute encephalopathy   Assessment & Plan:    GOC // encounter for palliative care  DNR  Cardiac arrests -- torsades then PEA  Shock -- cardiogenic +/- septic shock  Acute HFrEF Possible anterior MI  Afib RVR Acute hypoxic respiratory failure -- HCAP vs aspiration PNA Bilateral pleural effusion Pulmonary edema  Recent COVID infection  Anemia  Coagulopathy Chronic diarrhea NAGMA  Hypervolemic hyponatremia Hypokalemia  Hypermagnesemia  Hypocalcemia  Hx HTN Hx HLD  GERD  Hx Anxiety, depression FTT in adult  Protein calorie malnutrition

## 2022-09-24 NOTE — Progress Notes (Signed)
[facility] ADULT ICU REPLACEMENT PROTOCOL   The patient does apply for the [facility] Adult ICU Electrolyte Replacment Protocol based on the criteria listed below:   1.Exclusion criteria: TCTS, ECMO, Dialysis, and Myasthenia Gravis patients 2. Is GFR >/= 30 ml/min? Yes.    Patient's GFR today is >60 3. Is SCr </= 2? Yes.   Patient's SCr is 0.95 mg/dL 4. Did SCr increase >/= 0.5 in 24 hours? No. 5.Pt's weight >40kg  Yes.   6. Abnormal electrolyte(s): k 3.2  7. Electrolytes replaced per protocol 8.  Call MD STAT for K+ </= 2.5, Phos </= 1, or Mag </= 1 Physician:    Markus Daft A 09/24/2022 5:58 AM

## 2022-09-24 NOTE — IPAL (Signed)
  Interdisciplinary Goals of Care Family Meeting   Date carried out: 09/24/2022  Location of the meeting: Bedside  Member's involved: Nurse Practitioner, Bedside Registered Nurse, and Family Member or next of kin  Durable Power of Attorney or acting medical decision maker: Pt     Discussion: We discussed goals of care for [employer] .  Brenda [name] is ready to pass. She has been suffering for a while.  We talked about pursuing comfort care. She would like this. Husband Brenda [name] is accepting of this. We talked for a while about how this is not giving up, it is simply shifting our focus away from numbers and focusing on [name]'s QOL for whatever time remains   It is very important for her dog to visit her. We will coordinate this before dc pressors. Otherwise, comfort focussed care to start today   Code status:   Code Status: Do not attempt resuscitation (DNR) - Comfort care   Disposition: In-patient comfort care  Time spent for the meeting:     Lanier Clam, NP  09/24/2022, 8:20 AM

## 2022-09-24 NOTE — Progress Notes (Signed)
All family at bedside. Pt expresses wishes to begin full comfort care, appropriate orders placed by CCM and Palliative.

## 2022-09-27 LAB — CULTURE, BLOOD (ROUTINE X 2)
Culture: NO GROWTH
Culture: NO GROWTH
Special Requests: ADEQUATE
Special Requests: ADEQUATE

## 2022-10-19 NOTE — Progress Notes (Addendum)
Patient expired 09/30/2022 00:24 while family at bedside. Confirmed by auscultation by 2 RNs.   [facility] notified.   Morphine gtt wasted in med room with Marletta Lor RN, 25 mL.

## 2022-10-19 NOTE — Death Summary Note (Signed)
T1 and T3. Chronic L2 wedge compression deformity with prior vertebral augmentation. Electronically Signed   By: Sharlet Salina M.D.   On: 09/13/2022 17:42   US Renal  Result Date: 09/13/2022 CLINICAL DATA:  Renal failure.  Patient is COVID positive. EXAM: RENAL / URINARY TRACT ULTRASOUND COMPLETE COMPARISON:  CT scan abdomen and pelvis from 07/08/2022. FINDINGS: Right Kidney: Renal measurements: 3.7 x 3.9 x 8.0 cm. = volume: 61.1 mL. Echogenicity within normal limits. No mass or hydronephrosis visualized. Left Kidney: Renal measurements: 4.4 x 4.7 x 8.8 cm. = volume: 96.0 mL. Echogenicity within normal limits. No mass or hydronephrosis visualized. Bladder: Appears normal for degree of bladder distention. Other: None. IMPRESSION: *Normal renal sonogram. Electronically Signed   By: Jules Schick M.D.   On: 09/13/2022 16:24   DG Chest Portable 1 View  Result Date: 09/13/2022 CLINICAL DATA:  COVID  diagnosis with abdominal pain, nausea, vomiting, and diarrhea EXAM: PORTABLE CHEST 1 VIEW COMPARISON:  Chest radiograph dated 07/08/2022 FINDINGS: Low lung volumes. No focal consolidations. No pleural effusion or pneumothorax. The heart size and mediastinal contours are within normal limits. No acute osseous abnormality. IMPRESSION: Low lung volumes without focal consolidation. Electronically Signed   By: Agustin Cree M.D.   On: 09/13/2022 14:05    Microbiology Recent Results (from the past 240 hour(s))  Culture, blood (Routine x 2)     Status: None (Preliminary result)   Collection Time: 10/01/2022 10:40 AM   Specimen: BLOOD  Result Value Ref Range Status   Specimen Description BLOOD SITE NOT SPECIFIED  Final   Special Requests   Final    BOTTLES DRAWN AEROBIC AND ANAEROBIC Blood Culture adequate volume   Culture   Final    NO GROWTH 3 DAYS Performed at [facility], [address] [place], [place] [address]    Report Status PENDING  Incomplete  Culture, blood (Routine x 2)     Status: None (Preliminary result)   Collection Time: 10/09/2022 10:45 AM   Specimen: BLOOD  Result Value Ref Range Status   Specimen Description BLOOD LEFT ANTECUBITAL  Final   Special Requests   Final    BOTTLES DRAWN AEROBIC AND ANAEROBIC Blood Culture adequate volume   Culture   Final    NO GROWTH 3 DAYS Performed at [facility], [address] [place], [place] [address]    Report Status PENDING  Incomplete  MRSA Next Gen by PCR, Nasal     Status: None   Collection Time: 09/27/2022  1:16 PM   Specimen: Nasal Mucosa; Nasal Swab  Result Value Ref Range Status   MRSA by PCR Next Gen NOT DETECTED NOT DETECTED Final    Comment: (NOTE) The GeneXpert MRSA Assay (FDA approved for NASAL specimens only), is one component of a comprehensive MRSA colonization surveillance program. It is not intended to diagnose MRSA infection nor to guide or monitor treatment for MRSA infections. Test performance is not FDA  approved in patients less than 44 years old. Performed at [facility], [address] [place], [place] [address]   SARS Coronavirus 2 by RT PCR (hospital order, performed in [facility] hospital lab) *cepheid single result test* Anterior Nasal Swab     Status: Abnormal   Collection Time: 09/28/2022  1:16 PM   Specimen: Anterior Nasal Swab  Result Value Ref Range Status   SARS Coronavirus 2 by RT PCR POSITIVE (A) NEGATIVE Final    Comment: Performed at [facility]  T1 and T3. Chronic L2 wedge compression deformity with prior vertebral augmentation. Electronically Signed   By: Sharlet Salina M.D.   On: 09/13/2022 17:42   US Renal  Result Date: 09/13/2022 CLINICAL DATA:  Renal failure.  Patient is COVID positive. EXAM: RENAL / URINARY TRACT ULTRASOUND COMPLETE COMPARISON:  CT scan abdomen and pelvis from 07/08/2022. FINDINGS: Right Kidney: Renal measurements: 3.7 x 3.9 x 8.0 cm. = volume: 61.1 mL. Echogenicity within normal limits. No mass or hydronephrosis visualized. Left Kidney: Renal measurements: 4.4 x 4.7 x 8.8 cm. = volume: 96.0 mL. Echogenicity within normal limits. No mass or hydronephrosis visualized. Bladder: Appears normal for degree of bladder distention. Other: None. IMPRESSION: *Normal renal sonogram. Electronically Signed   By: Jules Schick M.D.   On: 09/13/2022 16:24   DG Chest Portable 1 View  Result Date: 09/13/2022 CLINICAL DATA:  COVID  diagnosis with abdominal pain, nausea, vomiting, and diarrhea EXAM: PORTABLE CHEST 1 VIEW COMPARISON:  Chest radiograph dated 07/08/2022 FINDINGS: Low lung volumes. No focal consolidations. No pleural effusion or pneumothorax. The heart size and mediastinal contours are within normal limits. No acute osseous abnormality. IMPRESSION: Low lung volumes without focal consolidation. Electronically Signed   By: Agustin Cree M.D.   On: 09/13/2022 14:05    Microbiology Recent Results (from the past 240 hour(s))  Culture, blood (Routine x 2)     Status: None (Preliminary result)   Collection Time: 10/01/2022 10:40 AM   Specimen: BLOOD  Result Value Ref Range Status   Specimen Description BLOOD SITE NOT SPECIFIED  Final   Special Requests   Final    BOTTLES DRAWN AEROBIC AND ANAEROBIC Blood Culture adequate volume   Culture   Final    NO GROWTH 3 DAYS Performed at [facility], [address] [place], [place] [address]    Report Status PENDING  Incomplete  Culture, blood (Routine x 2)     Status: None (Preliminary result)   Collection Time: 10/09/2022 10:45 AM   Specimen: BLOOD  Result Value Ref Range Status   Specimen Description BLOOD LEFT ANTECUBITAL  Final   Special Requests   Final    BOTTLES DRAWN AEROBIC AND ANAEROBIC Blood Culture adequate volume   Culture   Final    NO GROWTH 3 DAYS Performed at [facility], [address] [place], [place] [address]    Report Status PENDING  Incomplete  MRSA Next Gen by PCR, Nasal     Status: None   Collection Time: 09/27/2022  1:16 PM   Specimen: Nasal Mucosa; Nasal Swab  Result Value Ref Range Status   MRSA by PCR Next Gen NOT DETECTED NOT DETECTED Final    Comment: (NOTE) The GeneXpert MRSA Assay (FDA approved for NASAL specimens only), is one component of a comprehensive MRSA colonization surveillance program. It is not intended to diagnose MRSA infection nor to guide or monitor treatment for MRSA infections. Test performance is not FDA  approved in patients less than 44 years old. Performed at [facility], [address] [place], [place] [address]   SARS Coronavirus 2 by RT PCR (hospital order, performed in [facility] hospital lab) *cepheid single result test* Anterior Nasal Swab     Status: Abnormal   Collection Time: 09/28/2022  1:16 PM   Specimen: Anterior Nasal Swab  Result Value Ref Range Status   SARS Coronavirus 2 by RT PCR POSITIVE (A) NEGATIVE Final    Comment: Performed at [facility]  T1 and T3. Chronic L2 wedge compression deformity with prior vertebral augmentation. Electronically Signed   By: Sharlet Salina M.D.   On: 09/13/2022 17:42   US Renal  Result Date: 09/13/2022 CLINICAL DATA:  Renal failure.  Patient is COVID positive. EXAM: RENAL / URINARY TRACT ULTRASOUND COMPLETE COMPARISON:  CT scan abdomen and pelvis from 07/08/2022. FINDINGS: Right Kidney: Renal measurements: 3.7 x 3.9 x 8.0 cm. = volume: 61.1 mL. Echogenicity within normal limits. No mass or hydronephrosis visualized. Left Kidney: Renal measurements: 4.4 x 4.7 x 8.8 cm. = volume: 96.0 mL. Echogenicity within normal limits. No mass or hydronephrosis visualized. Bladder: Appears normal for degree of bladder distention. Other: None. IMPRESSION: *Normal renal sonogram. Electronically Signed   By: Jules Schick M.D.   On: 09/13/2022 16:24   DG Chest Portable 1 View  Result Date: 09/13/2022 CLINICAL DATA:  COVID  diagnosis with abdominal pain, nausea, vomiting, and diarrhea EXAM: PORTABLE CHEST 1 VIEW COMPARISON:  Chest radiograph dated 07/08/2022 FINDINGS: Low lung volumes. No focal consolidations. No pleural effusion or pneumothorax. The heart size and mediastinal contours are within normal limits. No acute osseous abnormality. IMPRESSION: Low lung volumes without focal consolidation. Electronically Signed   By: Agustin Cree M.D.   On: 09/13/2022 14:05    Microbiology Recent Results (from the past 240 hour(s))  Culture, blood (Routine x 2)     Status: None (Preliminary result)   Collection Time: 10/01/2022 10:40 AM   Specimen: BLOOD  Result Value Ref Range Status   Specimen Description BLOOD SITE NOT SPECIFIED  Final   Special Requests   Final    BOTTLES DRAWN AEROBIC AND ANAEROBIC Blood Culture adequate volume   Culture   Final    NO GROWTH 3 DAYS Performed at [facility], [address] [place], [place] [address]    Report Status PENDING  Incomplete  Culture, blood (Routine x 2)     Status: None (Preliminary result)   Collection Time: 10/09/2022 10:45 AM   Specimen: BLOOD  Result Value Ref Range Status   Specimen Description BLOOD LEFT ANTECUBITAL  Final   Special Requests   Final    BOTTLES DRAWN AEROBIC AND ANAEROBIC Blood Culture adequate volume   Culture   Final    NO GROWTH 3 DAYS Performed at [facility], [address] [place], [place] [address]    Report Status PENDING  Incomplete  MRSA Next Gen by PCR, Nasal     Status: None   Collection Time: 09/27/2022  1:16 PM   Specimen: Nasal Mucosa; Nasal Swab  Result Value Ref Range Status   MRSA by PCR Next Gen NOT DETECTED NOT DETECTED Final    Comment: (NOTE) The GeneXpert MRSA Assay (FDA approved for NASAL specimens only), is one component of a comprehensive MRSA colonization surveillance program. It is not intended to diagnose MRSA infection nor to guide or monitor treatment for MRSA infections. Test performance is not FDA  approved in patients less than 44 years old. Performed at [facility], [address] [place], [place] [address]   SARS Coronavirus 2 by RT PCR (hospital order, performed in [facility] hospital lab) *cepheid single result test* Anterior Nasal Swab     Status: Abnormal   Collection Time: 09/28/2022  1:16 PM   Specimen: Anterior Nasal Swab  Result Value Ref Range Status   SARS Coronavirus 2 by RT PCR POSITIVE (A) NEGATIVE Final    Comment: Performed at [facility]  matter, nonspecific but compatible with chronic small vessel ischemic disease. There is no acute intracranial  hemorrhage. No demarcated cortical infarct. No extra-axial fluid collection. No evidence of an intracranial mass. No midline shift. Vascular: Atherosclerotic calcifications. Skull: No calvarial fracture or aggressive osseous lesion. Sinuses/Orbits: No mass or acute finding within the imaged orbits. Post-surgical appearance of the paranasal sinuses. Mild mucosal thickening within the right maxillary sinus. Small fluid level, and mild background mucosal thickening, within the left maxillary sinus. Mild mucosal thickening, and small-volume secretions, within the right sphenoid sinus. Mild mucosal thickening within bilateral ethmoid air cells. Other: Redemonstrated chronic fracture deformities of the lateral wall of the right orbit, posterolateral wall the right maxillary sinus and right zygomatic arch. IMPRESSION: 1. No CT evidence of an acute intracranial abnormality. Please note, a brain MRI would have greater sensitivity for acute hypoxic/ischemic injury. 2. Parenchymal atrophy, chronic small vessel ischemic disease and chronic infarcts as described. 3. Paranasal sinus disease as outlined. 4. Redemonstrated chronic facial fracture deformities. Electronically Signed   By: Jackey Loge D.O.   On: 09/28/2022 14:43   DG Chest Port 1 View  Result Date: 10/05/2022 CLINICAL DATA:  placement of tube EXAM: PORTABLE CHEST 1 VIEW COMPARISON:  09/20/2022, 10 a.m. FINDINGS: There is diffusely increased vascular congestion, in very short timeframe, favoring congestive heart failure/pulmonary edema. There are bilateral pleural effusions, better seen on the CT scan chest performed just before this exam. Stable cardio-mediastinal silhouette. No acute osseous abnormalities. Upper lumbar vertebral kyphoplasty again seen. The soft tissues are within normal limits. Tube/lines: * Endotracheal tube tip is 3.5 cm from the carina. * An enteric tube extends below diaphragm, tip out of the view of the radiograph. However, the side hole  overlies the left upper abdomen, within the proximal stomach. *Multiple additional monitoring electrodes noted overlying the upper abdomen. IMPRESSION: *Interval development of congestive heart failure/pulmonary edema. *Tube/lines, as described above. Electronically Signed   By: Jules Schick M.D.   On: 10/10/2022 13:18   CT Angio Chest PE W and/or Wo Contrast  Result Date: 10/06/2022 CLINICAL DATA:  Elevated D-dimer. Shortness of breath. Evaluate for pulmonary embolism. EXAM: CT ANGIOGRAPHY CHEST WITH CONTRAST TECHNIQUE: Multidetector CT imaging of the chest was performed using the standard protocol during bolus administration of intravenous contrast. Multiplanar CT image reconstructions and MIPs were obtained to evaluate the vascular anatomy. RADIATION DOSE REDUCTION: This exam was performed according to the departmental dose-optimization program which includes automated exposure control, adjustment of the mA and/or kV according to patient size and/or use of iterative reconstruction technique. CONTRAST:  75mL OMNIPAQUE IOHEXOL 350 MG/ML SOLN COMPARISON:  09/13/2022; chest radiograph-10/03/2022 FINDINGS: Vascular Findings: There is adequate opacification of the pulmonary arterial system with the main pulmonary artery measuring 816 Hounsfield units. There are no discrete filling defects within the pulmonary arterial tree to suggest pulmonary embolism. Normal caliber of the main pulmonary artery. Borderline cardiomegaly. Coronary artery calcifications. Scattered atherosclerotic plaque within a normal caliber thoracic aorta. Conventional configuration of the aortic arch. The descending thoracic aorta is of normal caliber. Review of the MIP images confirms the above findings. ---------------------------------------------------------------------------------- Nonvascular Findings: Mediastinum/Lymph Nodes: No bulky mediastinal, hilar or axillary lymphadenopathy Lungs/Pleura: Compared to recent chest CT performed  09/13/2022, there has been development of small to moderate-sized bilateral pleural effusions with associated dependent subsegmental atelectasis, most conspicuously involving the bilateral lower lobes. No associated air bronchograms. Redemonstrated apical predominant centrilobular emphysematous change. The central pulmonary airways appear widely patent. No discrete pulmonary nodules given parenchymal atelectasis/collapse.  matter, nonspecific but compatible with chronic small vessel ischemic disease. There is no acute intracranial  hemorrhage. No demarcated cortical infarct. No extra-axial fluid collection. No evidence of an intracranial mass. No midline shift. Vascular: Atherosclerotic calcifications. Skull: No calvarial fracture or aggressive osseous lesion. Sinuses/Orbits: No mass or acute finding within the imaged orbits. Post-surgical appearance of the paranasal sinuses. Mild mucosal thickening within the right maxillary sinus. Small fluid level, and mild background mucosal thickening, within the left maxillary sinus. Mild mucosal thickening, and small-volume secretions, within the right sphenoid sinus. Mild mucosal thickening within bilateral ethmoid air cells. Other: Redemonstrated chronic fracture deformities of the lateral wall of the right orbit, posterolateral wall the right maxillary sinus and right zygomatic arch. IMPRESSION: 1. No CT evidence of an acute intracranial abnormality. Please note, a brain MRI would have greater sensitivity for acute hypoxic/ischemic injury. 2. Parenchymal atrophy, chronic small vessel ischemic disease and chronic infarcts as described. 3. Paranasal sinus disease as outlined. 4. Redemonstrated chronic facial fracture deformities. Electronically Signed   By: Jackey Loge D.O.   On: 09/28/2022 14:43   DG Chest Port 1 View  Result Date: 10/05/2022 CLINICAL DATA:  placement of tube EXAM: PORTABLE CHEST 1 VIEW COMPARISON:  09/20/2022, 10 a.m. FINDINGS: There is diffusely increased vascular congestion, in very short timeframe, favoring congestive heart failure/pulmonary edema. There are bilateral pleural effusions, better seen on the CT scan chest performed just before this exam. Stable cardio-mediastinal silhouette. No acute osseous abnormalities. Upper lumbar vertebral kyphoplasty again seen. The soft tissues are within normal limits. Tube/lines: * Endotracheal tube tip is 3.5 cm from the carina. * An enteric tube extends below diaphragm, tip out of the view of the radiograph. However, the side hole  overlies the left upper abdomen, within the proximal stomach. *Multiple additional monitoring electrodes noted overlying the upper abdomen. IMPRESSION: *Interval development of congestive heart failure/pulmonary edema. *Tube/lines, as described above. Electronically Signed   By: Jules Schick M.D.   On: 10/10/2022 13:18   CT Angio Chest PE W and/or Wo Contrast  Result Date: 10/06/2022 CLINICAL DATA:  Elevated D-dimer. Shortness of breath. Evaluate for pulmonary embolism. EXAM: CT ANGIOGRAPHY CHEST WITH CONTRAST TECHNIQUE: Multidetector CT imaging of the chest was performed using the standard protocol during bolus administration of intravenous contrast. Multiplanar CT image reconstructions and MIPs were obtained to evaluate the vascular anatomy. RADIATION DOSE REDUCTION: This exam was performed according to the departmental dose-optimization program which includes automated exposure control, adjustment of the mA and/or kV according to patient size and/or use of iterative reconstruction technique. CONTRAST:  75mL OMNIPAQUE IOHEXOL 350 MG/ML SOLN COMPARISON:  09/13/2022; chest radiograph-10/03/2022 FINDINGS: Vascular Findings: There is adequate opacification of the pulmonary arterial system with the main pulmonary artery measuring 816 Hounsfield units. There are no discrete filling defects within the pulmonary arterial tree to suggest pulmonary embolism. Normal caliber of the main pulmonary artery. Borderline cardiomegaly. Coronary artery calcifications. Scattered atherosclerotic plaque within a normal caliber thoracic aorta. Conventional configuration of the aortic arch. The descending thoracic aorta is of normal caliber. Review of the MIP images confirms the above findings. ---------------------------------------------------------------------------------- Nonvascular Findings: Mediastinum/Lymph Nodes: No bulky mediastinal, hilar or axillary lymphadenopathy Lungs/Pleura: Compared to recent chest CT performed  09/13/2022, there has been development of small to moderate-sized bilateral pleural effusions with associated dependent subsegmental atelectasis, most conspicuously involving the bilateral lower lobes. No associated air bronchograms. Redemonstrated apical predominant centrilobular emphysematous change. The central pulmonary airways appear widely patent. No discrete pulmonary nodules given parenchymal atelectasis/collapse.  matter, nonspecific but compatible with chronic small vessel ischemic disease. There is no acute intracranial  hemorrhage. No demarcated cortical infarct. No extra-axial fluid collection. No evidence of an intracranial mass. No midline shift. Vascular: Atherosclerotic calcifications. Skull: No calvarial fracture or aggressive osseous lesion. Sinuses/Orbits: No mass or acute finding within the imaged orbits. Post-surgical appearance of the paranasal sinuses. Mild mucosal thickening within the right maxillary sinus. Small fluid level, and mild background mucosal thickening, within the left maxillary sinus. Mild mucosal thickening, and small-volume secretions, within the right sphenoid sinus. Mild mucosal thickening within bilateral ethmoid air cells. Other: Redemonstrated chronic fracture deformities of the lateral wall of the right orbit, posterolateral wall the right maxillary sinus and right zygomatic arch. IMPRESSION: 1. No CT evidence of an acute intracranial abnormality. Please note, a brain MRI would have greater sensitivity for acute hypoxic/ischemic injury. 2. Parenchymal atrophy, chronic small vessel ischemic disease and chronic infarcts as described. 3. Paranasal sinus disease as outlined. 4. Redemonstrated chronic facial fracture deformities. Electronically Signed   By: Jackey Loge D.O.   On: 09/28/2022 14:43   DG Chest Port 1 View  Result Date: 10/05/2022 CLINICAL DATA:  placement of tube EXAM: PORTABLE CHEST 1 VIEW COMPARISON:  09/20/2022, 10 a.m. FINDINGS: There is diffusely increased vascular congestion, in very short timeframe, favoring congestive heart failure/pulmonary edema. There are bilateral pleural effusions, better seen on the CT scan chest performed just before this exam. Stable cardio-mediastinal silhouette. No acute osseous abnormalities. Upper lumbar vertebral kyphoplasty again seen. The soft tissues are within normal limits. Tube/lines: * Endotracheal tube tip is 3.5 cm from the carina. * An enteric tube extends below diaphragm, tip out of the view of the radiograph. However, the side hole  overlies the left upper abdomen, within the proximal stomach. *Multiple additional monitoring electrodes noted overlying the upper abdomen. IMPRESSION: *Interval development of congestive heart failure/pulmonary edema. *Tube/lines, as described above. Electronically Signed   By: Jules Schick M.D.   On: 10/10/2022 13:18   CT Angio Chest PE W and/or Wo Contrast  Result Date: 10/06/2022 CLINICAL DATA:  Elevated D-dimer. Shortness of breath. Evaluate for pulmonary embolism. EXAM: CT ANGIOGRAPHY CHEST WITH CONTRAST TECHNIQUE: Multidetector CT imaging of the chest was performed using the standard protocol during bolus administration of intravenous contrast. Multiplanar CT image reconstructions and MIPs were obtained to evaluate the vascular anatomy. RADIATION DOSE REDUCTION: This exam was performed according to the departmental dose-optimization program which includes automated exposure control, adjustment of the mA and/or kV according to patient size and/or use of iterative reconstruction technique. CONTRAST:  75mL OMNIPAQUE IOHEXOL 350 MG/ML SOLN COMPARISON:  09/13/2022; chest radiograph-10/03/2022 FINDINGS: Vascular Findings: There is adequate opacification of the pulmonary arterial system with the main pulmonary artery measuring 816 Hounsfield units. There are no discrete filling defects within the pulmonary arterial tree to suggest pulmonary embolism. Normal caliber of the main pulmonary artery. Borderline cardiomegaly. Coronary artery calcifications. Scattered atherosclerotic plaque within a normal caliber thoracic aorta. Conventional configuration of the aortic arch. The descending thoracic aorta is of normal caliber. Review of the MIP images confirms the above findings. ---------------------------------------------------------------------------------- Nonvascular Findings: Mediastinum/Lymph Nodes: No bulky mediastinal, hilar or axillary lymphadenopathy Lungs/Pleura: Compared to recent chest CT performed  09/13/2022, there has been development of small to moderate-sized bilateral pleural effusions with associated dependent subsegmental atelectasis, most conspicuously involving the bilateral lower lobes. No associated air bronchograms. Redemonstrated apical predominant centrilobular emphysematous change. The central pulmonary airways appear widely patent. No discrete pulmonary nodules given parenchymal atelectasis/collapse.  T1 and T3. Chronic L2 wedge compression deformity with prior vertebral augmentation. Electronically Signed   By: Sharlet Salina M.D.   On: 09/13/2022 17:42   US Renal  Result Date: 09/13/2022 CLINICAL DATA:  Renal failure.  Patient is COVID positive. EXAM: RENAL / URINARY TRACT ULTRASOUND COMPLETE COMPARISON:  CT scan abdomen and pelvis from 07/08/2022. FINDINGS: Right Kidney: Renal measurements: 3.7 x 3.9 x 8.0 cm. = volume: 61.1 mL. Echogenicity within normal limits. No mass or hydronephrosis visualized. Left Kidney: Renal measurements: 4.4 x 4.7 x 8.8 cm. = volume: 96.0 mL. Echogenicity within normal limits. No mass or hydronephrosis visualized. Bladder: Appears normal for degree of bladder distention. Other: None. IMPRESSION: *Normal renal sonogram. Electronically Signed   By: Jules Schick M.D.   On: 09/13/2022 16:24   DG Chest Portable 1 View  Result Date: 09/13/2022 CLINICAL DATA:  COVID  diagnosis with abdominal pain, nausea, vomiting, and diarrhea EXAM: PORTABLE CHEST 1 VIEW COMPARISON:  Chest radiograph dated 07/08/2022 FINDINGS: Low lung volumes. No focal consolidations. No pleural effusion or pneumothorax. The heart size and mediastinal contours are within normal limits. No acute osseous abnormality. IMPRESSION: Low lung volumes without focal consolidation. Electronically Signed   By: Agustin Cree M.D.   On: 09/13/2022 14:05    Microbiology Recent Results (from the past 240 hour(s))  Culture, blood (Routine x 2)     Status: None (Preliminary result)   Collection Time: 10/01/2022 10:40 AM   Specimen: BLOOD  Result Value Ref Range Status   Specimen Description BLOOD SITE NOT SPECIFIED  Final   Special Requests   Final    BOTTLES DRAWN AEROBIC AND ANAEROBIC Blood Culture adequate volume   Culture   Final    NO GROWTH 3 DAYS Performed at [facility], [address] [place], [place] [address]    Report Status PENDING  Incomplete  Culture, blood (Routine x 2)     Status: None (Preliminary result)   Collection Time: 10/09/2022 10:45 AM   Specimen: BLOOD  Result Value Ref Range Status   Specimen Description BLOOD LEFT ANTECUBITAL  Final   Special Requests   Final    BOTTLES DRAWN AEROBIC AND ANAEROBIC Blood Culture adequate volume   Culture   Final    NO GROWTH 3 DAYS Performed at [facility], [address] [place], [place] [address]    Report Status PENDING  Incomplete  MRSA Next Gen by PCR, Nasal     Status: None   Collection Time: 09/27/2022  1:16 PM   Specimen: Nasal Mucosa; Nasal Swab  Result Value Ref Range Status   MRSA by PCR Next Gen NOT DETECTED NOT DETECTED Final    Comment: (NOTE) The GeneXpert MRSA Assay (FDA approved for NASAL specimens only), is one component of a comprehensive MRSA colonization surveillance program. It is not intended to diagnose MRSA infection nor to guide or monitor treatment for MRSA infections. Test performance is not FDA  approved in patients less than 44 years old. Performed at [facility], [address] [place], [place] [address]   SARS Coronavirus 2 by RT PCR (hospital order, performed in [facility] hospital lab) *cepheid single result test* Anterior Nasal Swab     Status: Abnormal   Collection Time: 09/28/2022  1:16 PM   Specimen: Anterior Nasal Swab  Result Value Ref Range Status   SARS Coronavirus 2 by RT PCR POSITIVE (A) NEGATIVE Final    Comment: Performed at [facility]

## 2022-10-19 DEATH — deceased

## 2023-04-15 ENCOUNTER — Ambulatory Visit: Payer: Medicare PPO | Admitting: Neurology
